# Patient Record
Sex: Female | Born: 1960
Health system: Southern US, Community
[De-identification: ages and names within clinical notes are randomized; demographics above are authoritative.]

## PROBLEM LIST (undated history)

## (undated) DIAGNOSIS — C4370 Malignant melanoma of unspecified lower limb, including hip: Secondary | ICD-10-CM

## (undated) DIAGNOSIS — E039 Hypothyroidism, unspecified: Secondary | ICD-10-CM

## (undated) DIAGNOSIS — L409 Psoriasis, unspecified: Secondary | ICD-10-CM

## (undated) DIAGNOSIS — K759 Inflammatory liver disease, unspecified: Secondary | ICD-10-CM

## (undated) DIAGNOSIS — J189 Pneumonia, unspecified organism: Secondary | ICD-10-CM

## (undated) DIAGNOSIS — T7840XA Allergy, unspecified, initial encounter: Secondary | ICD-10-CM

## (undated) DIAGNOSIS — C4490 Unspecified malignant neoplasm of skin, unspecified: Secondary | ICD-10-CM

## (undated) DIAGNOSIS — E559 Vitamin D deficiency, unspecified: Secondary | ICD-10-CM

## (undated) DIAGNOSIS — D649 Anemia, unspecified: Secondary | ICD-10-CM

## (undated) DIAGNOSIS — L719 Rosacea, unspecified: Secondary | ICD-10-CM

## (undated) DIAGNOSIS — I480 Paroxysmal atrial fibrillation: Secondary | ICD-10-CM

## (undated) DIAGNOSIS — J45901 Unspecified asthma with (acute) exacerbation: Secondary | ICD-10-CM

## (undated) DIAGNOSIS — R011 Cardiac murmur, unspecified: Secondary | ICD-10-CM

## (undated) DIAGNOSIS — E7800 Pure hypercholesterolemia, unspecified: Secondary | ICD-10-CM

## (undated) DIAGNOSIS — M1990 Unspecified osteoarthritis, unspecified site: Secondary | ICD-10-CM

## (undated) DIAGNOSIS — I10 Essential (primary) hypertension: Secondary | ICD-10-CM

## (undated) DIAGNOSIS — H409 Unspecified glaucoma: Secondary | ICD-10-CM

## (undated) DIAGNOSIS — I4892 Unspecified atrial flutter: Secondary | ICD-10-CM

## (undated) HISTORY — DX: Psoriasis, unspecified: L40.9

## (undated) HISTORY — PX: ENDOMETRIAL ABLATION: SHX621

## (undated) HISTORY — DX: Essential (primary) hypertension: I10

## (undated) HISTORY — DX: Vitamin D deficiency, unspecified: E55.9

## (undated) HISTORY — DX: Allergy, unspecified, initial encounter: T78.40XA

## (undated) HISTORY — PX: TUBAL LIGATION: SHX77

## (undated) HISTORY — DX: Rosacea, unspecified: L71.9

## (undated) HISTORY — DX: Hypothyroidism, unspecified: E03.9

## (undated) HISTORY — DX: Paroxysmal atrial fibrillation: I48.0

## (undated) HISTORY — DX: Unspecified glaucoma: H40.9

## (undated) HISTORY — DX: Unspecified malignant neoplasm of skin, unspecified: C44.90

## (undated) HISTORY — DX: Anemia, unspecified: D64.9

## (undated) HISTORY — PX: REFRACTIVE SURGERY: SHX103

---
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
## 2011-12-19 ENCOUNTER — Encounter: Payer: Self-pay | Admitting: Cardiovascular Disease

## 2011-12-20 ENCOUNTER — Ambulatory Visit (INDEPENDENT_AMBULATORY_CARE_PROVIDER_SITE_OTHER): Payer: 59 | Admitting: Cardiovascular Disease

## 2011-12-20 ENCOUNTER — Encounter: Payer: Self-pay | Admitting: Cardiovascular Disease

## 2011-12-20 VITALS — BP 130/80 | HR 72 | Ht 66.0 in | Wt 207.0 lb

## 2011-12-20 DIAGNOSIS — R079 Chest pain, unspecified: Secondary | ICD-10-CM

## 2011-12-20 DIAGNOSIS — Z79899 Other long term (current) drug therapy: Secondary | ICD-10-CM

## 2011-12-20 DIAGNOSIS — E782 Mixed hyperlipidemia: Secondary | ICD-10-CM | POA: Insufficient documentation

## 2011-12-20 DIAGNOSIS — I480 Paroxysmal atrial fibrillation: Secondary | ICD-10-CM

## 2011-12-20 DIAGNOSIS — I4891 Unspecified atrial fibrillation: Secondary | ICD-10-CM

## 2011-12-20 NOTE — Assessment & Plan Note (Signed)
Cholesterol is at goal.  Continue current dose of statin and diet Rx.  No myalgias or side effects.  F/U  LFT's in 6 months. No results found for this basename: LDLCALC  Labs with primary            

## 2011-12-20 NOTE — Patient Instructions (Signed)
Your physician has requested that you have an exercise tolerance test. For further information please visit [email or web address] Please also follow instruction sheet, as given. DX V58.69  427.31 You have been referred to EP  AFIB  AND ON MULTAQ Your physician recommends that you continue on your current medications as directed. Please refer to the Current Medication list given to you today.

## 2011-12-20 NOTE — Assessment & Plan Note (Signed)
Atypical normal ECG With PAF and Multaq will have her do ETT

## 2011-12-20 NOTE — Progress Notes (Signed)
Patient ID: Monica Morgan, female   DOB: 25-Mar-1961, 51 y.o.   MRN: [id number] 51 yo recently relocated from [facility] and needs cardiologist.  PAF over last 3-4 years.  No other structural heart disease.  Last stress test in 2009.  Initially Rx with rhythmol but had fatigue and weight gain. Never needed Anticoagulation or DCC.  Frequent intermitant episodes documented by monitor.  Especially when lying on left side.  Recent weight gain and more sedentary since moving to [place] and working at [employer].  Has occassional palpitations but much better on Multaq.  Discussed block box warning with her.  She prefers to stay on low dose Multaq at only 200mg  daily.  Has occasional SSCP when tired or with palpitations.  No associated dyspnea pleurisy or syncope.  Compliant with meds  CRF; include elevated cholesterol  [place] Score 0     ROS: Denies fever, malais, weight loss, blurry vision, decreased visual acuity, cough, sputum, SOB, hemoptysis, pleuritic pain, palpitaitons, heartburn, abdominal pain, melena, lower extremity edema, claudication, or rash.  All other systems reviewed and negative   General: Affect appropriate Healthy:  appears stated age HEENT: normal Neck supple with no adenopathy JVP normal no bruits no thyromegaly Lungs clear with no wheezing and good diaphragmatic motion Heart:  S1/S2 no murmur,rub, gallop or click PMI normal Abdomen: benighn, BS positve, no tenderness, no AAA no bruit.  No HSM or HJR Distal pulses intact with no bruits No edema Neuro non-focal Skin warm and dry No muscular weakness  Medications Current Outpatient Prescriptions  Medication Sig Dispense Refill  . aspirin 81 MG tablet Take 81 mg by mouth daily.      . betamethasone dipropionate (DIPROLENE) 0.05 % cream Apply 1 application topically as needed.      . dronedarone (MULTAQ) 400 MG tablet Take 200 mg by mouth daily.      Marland Kitchen HYDROcodone-acetaminophen (VICODIN) 5-500 MG per tablet Take 1 tablet by  mouth every 6 (six) hours as needed.      Marland Kitchen ibuprofen (ADVIL,MOTRIN) 200 MG tablet Take 800 mg by mouth every 6 (six) hours as needed.      . montelukast (SINGULAIR) 10 MG tablet Take 10 mg by mouth at bedtime.      . Multiple Vitamins-Minerals (OSTEO COMPLEX PO) Take by mouth.      . NON FORMULARY Take 1 application by mouth as needed. Metacream      . rosuvastatin (CRESTOR) 20 MG tablet Take 10 mg by mouth daily.        Allergies Lipitor; Rythmol; and Sulfa drugs cross reactors  Family History: No family history on file.  Social History: History   Social History  . Marital Status: Divorced    Spouse Name: N/A    Number of Children: N/A  . Years of Education: N/A   Occupational History  . Not on file.   Social History Main Topics  . Smoking status: Never Smoker   . Smokeless tobacco: Not on file  . Alcohol Use: Not on file  . Drug Use: Not on file  . Sexually Active: Not on file   Other Topics Concern  . Not on file   Social History Narrative  . No narrative on file    Electrocardiogram:  NSR rate 75 QT 442 normal  Assessment and Plan

## 2011-12-20 NOTE — Assessment & Plan Note (Signed)
Continue low dose Multaq  She indicates normal EF and recent LFT;s normal.  Refer to EPS to see if ongoing Multaq ok.  Alternative could be Flecainide ASA no need for coumadin

## 2012-02-07 ENCOUNTER — Ambulatory Visit (INDEPENDENT_AMBULATORY_CARE_PROVIDER_SITE_OTHER): Payer: 59 | Admitting: Cardiovascular Disease

## 2012-02-07 DIAGNOSIS — I4891 Unspecified atrial fibrillation: Secondary | ICD-10-CM

## 2012-02-07 DIAGNOSIS — Z79899 Other long term (current) drug therapy: Secondary | ICD-10-CM

## 2012-02-07 NOTE — Procedures (Signed)
Exercise Treadmill Test  Pre-Exercise Testing Evaluation Rhythm: normal sinus  Rate: 66   PR:  .15 QRS:  .09  QT:  .42 QTc: .44     Test  Exercise Tolerance Test Ordering MD: Charlton Haws, MD  Interpreting MD: Charlton Haws, MD  Unique Test No: 1  Treadmill:  1  Indication for ETT: A-FIB  Contraindication to ETT: No   Stress Modality: exercise - treadmill  Cardiac Imaging Performed: non   Protocol: standard Bruce - maximal  Max BP:  171/63  Max MPHR (bpm):  167 85% MPR (bpm):  142  MPHR obtained (bpm):  153 % MPHR obtained:  91  Reached 85% MPHR (min:sec): 5:54 Total Exercise Time (min-sec):  7:00  Workload in METS:  10.6 Borg Scale: 15  Reason ETT Terminated:  fatigue    ST Segment Analysis At Rest: normal ST segments - no evidence of significant ST depression With Exercise: no evidence of significant ST depression  Other Information Arrhythmia:  No Angina during ETT:  absent (0) Quality of ETT:  diagnostic  ETT Interpretation:  normal - no evidence of ischemia by ST analysis  Comments: Normal ETT with no proarrhythmia or ischemia  Recommendations: Continue low dose Multaq  Charlton Haws

## 2012-02-14 ENCOUNTER — Ambulatory Visit: Payer: 59 | Admitting: Internal Medicine

## 2012-03-20 ENCOUNTER — Ambulatory Visit (INDEPENDENT_AMBULATORY_CARE_PROVIDER_SITE_OTHER): Payer: 59 | Admitting: Internal Medicine

## 2012-03-20 ENCOUNTER — Encounter: Payer: Self-pay | Admitting: Internal Medicine

## 2012-03-20 VITALS — BP 124/74 | HR 72 | Ht 66.0 in | Wt 208.0 lb

## 2012-03-20 DIAGNOSIS — I480 Paroxysmal atrial fibrillation: Secondary | ICD-10-CM

## 2012-03-20 DIAGNOSIS — I4891 Unspecified atrial fibrillation: Secondary | ICD-10-CM

## 2012-03-20 DIAGNOSIS — E782 Mixed hyperlipidemia: Secondary | ICD-10-CM

## 2012-03-20 MED ORDER — DRONEDARONE HCL 400 MG PO TABS: 200.0000 mg | ORAL_TABLET | Freq: Every day | ORAL | Status: DC

## 2012-03-20 MED ORDER — ROSUVASTATIN CALCIUM 20 MG PO TABS: 10.0000 mg | ORAL_TABLET | Freq: Every day | ORAL | Status: DC

## 2012-03-20 NOTE — Assessment & Plan Note (Signed)
The patient is maintaining sinus rhythm very nicely on her low-dose medical therapy with multaq. She will continue her current medical therapy. Would not switch her to another antiarrhythmic drug at this time. She will continue low-dose aspirin.

## 2012-03-20 NOTE — Patient Instructions (Signed)
Your physician recommends that you schedule a follow-up appointment as needed with Dr Ladona Ridgel  Keep follow up with Dr Eden Emms

## 2012-03-20 NOTE — Progress Notes (Signed)
HPI Monica Morgan is referred today by Dr. Eden Emms for evaluation of atrial fibrillation. She is a very pleasant middle-age woman with a history of tachycardia palpitations dating back several years. She is taking multiple medications but has done well on Dronenderone. Her palpitations have essentially resolved on this medication. She is reduce her dose from 400 milligrams twice daily and 200 mg daily, and maintain good control of her atrial fibrillation. She denies chest pain or shortness of breath, and has no peripheral edema or syncope.  Allergies  Allergen Reactions  . Lipitor (Atorvastatin)   . Rythmol (Propafenone)   . Sulfa Drugs Cross Reactors      Current Outpatient Prescriptions  Medication Sig Dispense Refill  . aspirin 81 MG tablet Take 81 mg by mouth daily.      . betamethasone dipropionate (DIPROLENE) 0.05 % cream Apply 1 application topically as needed.      . dronedarone (MULTAQ) 400 MG tablet Take 0.5 tablets (200 mg total) by mouth daily.  45 tablet  3  . HYDROcodone-acetaminophen (VICODIN) 5-500 MG per tablet Take 1 tablet by mouth every 6 (six) hours as needed.      Marland Kitchen ibuprofen (ADVIL,MOTRIN) 200 MG tablet Take 800 mg by mouth every 6 (six) hours as needed.      . montelukast (SINGULAIR) 10 MG tablet Take 10 mg by mouth at bedtime as needed.       . Multiple Vitamins-Minerals (OSTEO COMPLEX PO) Take by mouth.      . NON FORMULARY Take 1 application by mouth as needed. Metacream      . rosuvastatin (CRESTOR) 20 MG tablet Take 0.5 tablets (10 mg total) by mouth daily.  45 tablet  3  . [DISCONTINUED] dronedarone (MULTAQ) 400 MG tablet Take 200 mg by mouth daily.      . [DISCONTINUED] rosuvastatin (CRESTOR) 20 MG tablet Take 10 mg by mouth daily.         Past Medical History  Diagnosis Date  . Hyperlipidemia   . Hypertension   . Arrhythmia     afib    ROS:   All systems reviewed and negative except as noted in the HPI.   No past surgical history on file.   No  family history on file.   History   Social History  . Marital Status: Divorced    Spouse Name: N/A    Number of Children: N/A  . Years of Education: N/A   Occupational History  . Not on file.   Social History Main Topics  . Smoking status: Never Smoker   . Smokeless tobacco: Not on file  . Alcohol Use: Not on file  . Drug Use: Not on file  . Sexually Active: Not on file   Other Topics Concern  . Not on file   Social History Narrative  . No narrative on file     BP 124/74  Pulse 72  Ht 5\' 6"  (1.676 m)  Wt 208 lb (94.348 kg)  BMI 33.57 kg/m2  Physical Exam:  Well appearing  middle-aged woman, NAD HEENT: Unremarkable Neck:  No JVD, no thyromegally Lungs:  Clear with no wheezes, rales, or rhonchi. HEART:  Regular rate rhythm, no murmurs, no rubs, no clicks Abd:  soft, positive bowel sounds, no organomegally, no rebound, no guarding Ext:  2 plus pulses, no edema, no cyanosis, no clubbing Skin:  No rashes no nodules Neuro:  CN II through XII intact, motor grossly intact  EKG  normal sinus rhythm with normal  axis and intervals.   Assess/Plan:

## 2012-03-20 NOTE — Assessment & Plan Note (Signed)
She will continue her statin therapy with Crestor. She is exercising regularly, and trying to lose weight.

## 2012-08-04 ENCOUNTER — Encounter: Payer: Self-pay | Admitting: *Deleted

## 2012-09-11 ENCOUNTER — Ambulatory Visit (INDEPENDENT_AMBULATORY_CARE_PROVIDER_SITE_OTHER): Payer: 59 | Admitting: Cardiovascular Disease

## 2012-09-11 ENCOUNTER — Encounter: Payer: Self-pay | Admitting: Cardiovascular Disease

## 2012-09-11 VITALS — BP 138/80 | HR 64 | Ht 66.0 in | Wt 215.0 lb

## 2012-09-11 DIAGNOSIS — I4891 Unspecified atrial fibrillation: Secondary | ICD-10-CM

## 2012-09-11 DIAGNOSIS — R079 Chest pain, unspecified: Secondary | ICD-10-CM

## 2012-09-11 DIAGNOSIS — I480 Paroxysmal atrial fibrillation: Secondary | ICD-10-CM

## 2012-09-11 DIAGNOSIS — E782 Mixed hyperlipidemia: Secondary | ICD-10-CM

## 2012-09-11 NOTE — Assessment & Plan Note (Signed)
Cholesterol is at goal.  Continue current dose of statin and diet Rx.  No myalgias or side effects.  F/U  LFT's in 6 months. No results found for this basename: LDLCALC             

## 2012-09-11 NOTE — Assessment & Plan Note (Signed)
Resolved normal ETT last year  observe

## 2012-09-11 NOTE — Progress Notes (Signed)
Patient ID: Monica Morgan, female   DOB: 04-16-1961, 52 y.o.   MRN: [id number] 52 yo recently relocated from [place] . PAF over last 3-4 years. No other structural heart disease. Last stress test in 2013 at our office was normal  Initially Rx with rhythmol but had fatigue and weight gain. Never needed Anticoagulation or DCC. Frequent intermitant episodes documented by monitor. Especially when lying on left side. Recent weight gain and more sedentary since moving to [place] and working at [employer]. Has occassional palpitations but much better on Multaq. Discussed block box warning with her. She prefers to stay on low dose Multaq at only 200mg  daily. Has occasional SSCP when tired or with palpitations. No associated dyspnea pleurisy or syncope. Compliant with meds CRF; include elevated cholesterol Italy Score 0  Seen by Dr Ladona Ridgel who agreed that maint of low dose Multaq ok   Labs at primary 8/13 had normal LFTs and LDL 61  She indicates just checked last week with primary and normal  ROS: Denies fever, malais, weight loss, blurry vision, decreased visual acuity, cough, sputum, SOB, hemoptysis, pleuritic pain, palpitaitons, heartburn, abdominal pain, melena, lower extremity edema, claudication, or rash.  All other systems reviewed and negative  General: Affect appropriate Overweight white female HEENT: normal Neck supple with no adenopathy JVP normal no bruits no thyromegaly Lungs clear with no wheezing and good diaphragmatic motion Heart:  S1/S2 no murmur, no rub, gallop or click PMI normal Abdomen: benighn, BS positve, no tenderness, no AAA no bruit.  No HSM or HJR Distal pulses intact with no bruits No edema Neuro non-focal Skin warm and dry No muscular weakness   Current Outpatient Prescriptions  Medication Sig Dispense Refill  . aspirin 81 MG tablet Take 81 mg by mouth daily.      . betamethasone dipropionate (DIPROLENE) 0.05 % cream Apply 1 application topically as needed.      .  dronedarone (MULTAQ) 400 MG tablet Take 0.5 tablets (200 mg total) by mouth daily.  45 tablet  3  . HYDROcodone-acetaminophen (VICODIN) 5-500 MG per tablet Take 1 tablet by mouth every 6 (six) hours as needed.      Marland Kitchen ibuprofen (ADVIL,MOTRIN) 200 MG tablet Take 800 mg by mouth every 6 (six) hours as needed.      . montelukast (SINGULAIR) 10 MG tablet Take 10 mg by mouth at bedtime as needed.       . Multiple Vitamins-Minerals (OSTEO COMPLEX PO) Take by mouth.      . NON FORMULARY Take 1 application by mouth as needed. Metacream      . rosuvastatin (CRESTOR) 20 MG tablet Take 0.5 tablets (10 mg total) by mouth daily.  45 tablet  3   No current facility-administered medications for this visit.    Allergies  Lipitor; Rythmol; and Sulfa drugs cross reactors  Electrocardiogram:  03/20/13 SR rate 72 normal QT 386  Assessment and Plan

## 2012-09-11 NOTE — Assessment & Plan Note (Signed)
Stable with no recurrence continue low dose Multaq and asa

## 2012-11-05 ENCOUNTER — Other Ambulatory Visit (HOSPITAL_COMMUNITY): Payer: Self-pay | Admitting: Internal Medicine

## 2012-11-05 DIAGNOSIS — Z1231 Encounter for screening mammogram for malignant neoplasm of breast: Secondary | ICD-10-CM

## 2012-11-13 ENCOUNTER — Ambulatory Visit (HOSPITAL_COMMUNITY): Payer: 59

## 2012-11-19 ENCOUNTER — Ambulatory Visit (HOSPITAL_COMMUNITY)
Admission: RE | Admit: 2012-11-19 | Discharge: 2012-11-19 | Disposition: A | Discharge status: Home / Self Care | Payer: 59 | Source: Ambulatory Visit | Attending: Internal Medicine | Admitting: Internal Medicine

## 2012-11-19 DIAGNOSIS — Z1231 Encounter for screening mammogram for malignant neoplasm of breast: Secondary | ICD-10-CM

## 2012-12-01 LAB — HM MAMMOGRAPHY

## 2012-12-11 ENCOUNTER — Encounter: Payer: Self-pay | Admitting: Gastroenterology

## 2012-12-16 ENCOUNTER — Other Ambulatory Visit (HOSPITAL_COMMUNITY): Payer: Self-pay | Admitting: Internal Medicine

## 2012-12-16 DIAGNOSIS — E2839 Other primary ovarian failure: Secondary | ICD-10-CM

## 2012-12-24 ENCOUNTER — Ambulatory Visit (HOSPITAL_COMMUNITY)
Admission: RE | Admit: 2012-12-24 | Discharge: 2012-12-24 | Disposition: A | Discharge status: Home / Self Care | Payer: 59 | Source: Ambulatory Visit | Attending: Internal Medicine | Admitting: Internal Medicine

## 2012-12-24 DIAGNOSIS — Z780 Asymptomatic menopausal state: Secondary | ICD-10-CM | POA: Insufficient documentation

## 2012-12-24 DIAGNOSIS — E2839 Other primary ovarian failure: Secondary | ICD-10-CM

## 2012-12-24 DIAGNOSIS — Z13820 Encounter for screening for osteoporosis: Secondary | ICD-10-CM | POA: Insufficient documentation

## 2012-12-24 LAB — HM DEXA SCAN

## 2013-02-13 ENCOUNTER — Encounter: Payer: 59 | Admitting: Gastroenterology

## 2013-02-18 ENCOUNTER — Ambulatory Visit (AMBULATORY_SURGERY_CENTER): Payer: Self-pay | Admitting: *Deleted

## 2013-02-18 VITALS — Ht 66.0 in | Wt 229.0 lb

## 2013-02-18 DIAGNOSIS — Z1211 Encounter for screening for malignant neoplasm of colon: Secondary | ICD-10-CM

## 2013-02-18 MED ORDER — MOVIPREP 100 G PO SOLR: 1.0000 | Freq: Once | ORAL | Status: DC

## 2013-02-18 NOTE — Progress Notes (Signed)
Sent flag to Dr.Perry asking him to review patient's history and medications list before she comes in for her screening colonoscopy.  Thanks!

## 2013-02-18 NOTE — Progress Notes (Signed)
Patient denies any allergies to eggs or soy. Patient denies any problems with anesthesia.  

## 2013-02-20 ENCOUNTER — Encounter: Payer: Self-pay | Admitting: Internal Medicine

## 2013-03-03 ENCOUNTER — Encounter: Payer: Self-pay | Admitting: Physician Assistant

## 2013-03-03 DIAGNOSIS — E785 Hyperlipidemia, unspecified: Secondary | ICD-10-CM

## 2013-03-03 DIAGNOSIS — I10 Essential (primary) hypertension: Secondary | ICD-10-CM

## 2013-03-03 DIAGNOSIS — E559 Vitamin D deficiency, unspecified: Secondary | ICD-10-CM

## 2013-03-04 ENCOUNTER — Encounter: Payer: Self-pay | Admitting: Physician Assistant

## 2013-03-04 ENCOUNTER — Ambulatory Visit: Payer: 59 | Admitting: Physician Assistant

## 2013-03-04 VITALS — BP 138/80 | HR 76 | Temp 98.1°F | Resp 16 | Ht 65.5 in | Wt 227.0 lb

## 2013-03-04 DIAGNOSIS — E782 Mixed hyperlipidemia: Secondary | ICD-10-CM

## 2013-03-04 DIAGNOSIS — E559 Vitamin D deficiency, unspecified: Secondary | ICD-10-CM

## 2013-03-04 DIAGNOSIS — I10 Essential (primary) hypertension: Secondary | ICD-10-CM

## 2013-03-04 LAB — HEPATIC FUNCTION PANEL
ALT: 10 U/L (ref 0–35)
AST: 16 U/L (ref 0–37)
Albumin: 4.2 g/dL (ref 3.5–5.2)
Alkaline Phosphatase: 67 U/L (ref 39–117)
Bilirubin, Direct: 0.1 mg/dL (ref 0.0–0.3)
Indirect Bilirubin: 0.5 mg/dL (ref 0.0–0.9)
Total Bilirubin: 0.6 mg/dL (ref 0.3–1.2)
Total Protein: 6.9 g/dL (ref 6.0–8.3)

## 2013-03-04 LAB — CBC WITH DIFFERENTIAL/PLATELET
Basophils Absolute: 0.1 10*3/uL (ref 0.0–0.1)
Basophils Relative: 0 % (ref 0–1)
Eosinophils Absolute: 0.2 10*3/uL (ref 0.0–0.7)
Eosinophils Relative: 1 % (ref 0–5)
HCT: 39.6 % (ref 36.0–46.0)
Hemoglobin: 13.6 g/dL (ref 12.0–15.0)
Lymphocytes Relative: 32 % (ref 12–46)
Lymphs Abs: 4.3 10*3/uL — ABNORMAL HIGH (ref 0.7–4.0)
MCH: 29.6 pg (ref 26.0–34.0)
MCHC: 34.3 g/dL (ref 30.0–36.0)
MCV: 86.3 fL (ref 78.0–100.0)
Monocytes Absolute: 1 10*3/uL (ref 0.1–1.0)
Monocytes Relative: 8 % (ref 3–12)
Neutro Abs: 8.1 10*3/uL — ABNORMAL HIGH (ref 1.7–7.7)
Neutrophils Relative %: 59 % (ref 43–77)
Platelets: 333 10*3/uL (ref 150–400)
RBC: 4.59 MIL/uL (ref 3.87–5.11)
RDW: 14.1 % (ref 11.5–15.5)
WBC: 13.7 10*3/uL — ABNORMAL HIGH (ref 4.0–10.5)

## 2013-03-04 LAB — LIPID PANEL
Cholesterol: 128 mg/dL (ref 0–200)
HDL: 49 mg/dL (ref >39)
LDL Cholesterol: 58 mg/dL (ref 0–99)
Total CHOL/HDL Ratio: 2.6 Ratio
Triglycerides: 104 mg/dL (ref <150)
VLDL: 21 mg/dL (ref 0–40)

## 2013-03-04 LAB — BASIC METABOLIC PANEL WITH GFR
BUN: 13 mg/dL (ref 6–23)
CO2: 24 mEq/L (ref 19–32)
Calcium: 9.3 mg/dL (ref 8.4–10.5)
Chloride: 103 mEq/L (ref 96–112)
Creat: 0.77 mg/dL (ref 0.50–1.10)
GFR, Est African American: >89 mL/min
GFR, Est Non African American: 88 mL/min
Glucose, Bld: 88 mg/dL (ref 70–99)
Potassium: 4.3 mEq/L (ref 3.5–5.3)
Sodium: 137 mEq/L (ref 135–145)

## 2013-03-04 MED ORDER — IBUPROFEN 800 MG PO TABS: 800.0000 mg | ORAL_TABLET | Freq: Four times a day (QID) | ORAL | Status: DC | PRN

## 2013-03-04 NOTE — Patient Instructions (Signed)
Compression stocking- Elastic therapy in [place].  Sleep Apnea  Sleep apnea is a sleep disorder characterized by abnormal pauses in breathing while you sleep. When your breathing pauses, the level of oxygen in your blood decreases. This causes you to move out of deep sleep and into light sleep. As a result, your quality of sleep is poor, and the system that carries your blood throughout your body (cardiovascular system) experiences stress. If sleep apnea remains untreated, the following conditions can develop:  High blood pressure (hypertension).  Coronary artery disease.  Inability to achieve or maintain an erection (impotence).  Impairment of your thought process (cognitive dysfunction). There are three types of sleep apnea: 1. Obstructive sleep apnea Pauses in breathing during sleep because of a blocked airway. 2. Central sleep apnea Pauses in breathing during sleep because the area of the brain that controls your breathing does not send the correct signals to the muscles that control breathing. 3. Mixed sleep apnea A combination of both obstructive and central sleep apnea. RISK FACTORS The following risk factors can increase your risk of developing sleep apnea:  Being overweight.  Smoking.  Having narrow passages in your nose and throat.  Being of older age.  Being female.  Alcohol use.  Sedative and tranquilizer use.  Ethnicity. Among individuals younger than 35 years, African Americans are at increased risk of sleep apnea. SYMPTOMS   Difficulty staying asleep.  Daytime sleepiness and fatigue.  Loss of energy.  Irritability.  Loud, heavy snoring.  Morning headaches.  Trouble concentrating.  Forgetfulness.  Decreased interest in sex. DIAGNOSIS  In order to diagnose sleep apnea, your caregiver will perform a physical examination. Your caregiver may suggest that you take a home sleep test. Your caregiver may also recommend that you spend the night in a sleep lab.  In the sleep lab, several monitors record information about your heart, lungs, and brain while you sleep. Your leg and arm movements and blood oxygen level are also recorded. TREATMENT The following actions may help to resolve mild sleep apnea:  Sleeping on your side.   Using a decongestant if you have nasal congestion.   Avoiding the use of depressants, including alcohol, sedatives, and narcotics.   Losing weight and modifying your diet if you are overweight. There also are devices and treatments to help open your airway:  Oral appliances. These are custom-made mouthpieces that shift your lower jaw forward and slightly open your bite. This opens your airway.  Devices that create positive airway pressure. This positive pressure "splints" your airway open to help you breathe better during sleep. The following devices create positive airway pressure:  Continuous positive airway pressure (CPAP) device. The CPAP device creates a continuous level of air pressure with an air pump. The air is delivered to your airway through a mask while you sleep. This continuous pressure keeps your airway open.  Nasal expiratory positive airway pressure (EPAP) device. The EPAP device creates positive air pressure as you exhale. The device consists of single-use valves, which are inserted into each nostril and held in place by adhesive. The valves create very little resistance when you inhale but create much more resistance when you exhale. That increased resistance creates the positive airway pressure. This positive pressure while you exhale keeps your airway open, making it easier to breath when you inhale again.  Bilevel positive airway pressure (BPAP) device. The BPAP device is used mainly in patients with central sleep apnea. This device is similar to the CPAP device because it  also uses an air pump to deliver continuous air pressure through a mask. However, with the BPAP machine, the pressure is set at two  different levels. The pressure when you exhale is lower than the pressure when you inhale.  Surgery. Typically, surgery is only done if you cannot comply with less invasive treatments or if the less invasive treatments do not improve your condition. Surgery involves removing excess tissue in your airway to create a wider passage way. Document Released: 03/23/2002 Document Revised: 07/28/2012 Document Reviewed: 08/09/2011 [facility] Patient Information 2014 [place], [place].

## 2013-03-04 NOTE — Progress Notes (Signed)
HPI Patient presents for 3 month follow up with hypertension, hyperlipidemia, prediabetes and vitamin D. Patient's blood pressure has been controlled at home. Patient denies chest pain, shortness of breath, dizziness.  Patient's cholesterol is diet controlled. In addition they are on crestor and denies myalgias.  Patient is now on compound pharmacy for estrogen and is doing well without hot flashes.  Patient is on Vitamin D supplement.  Current Medications:  Current Outpatient Prescriptions on File Prior to Visit  Medication Sig Dispense Refill  . aspirin 81 MG tablet Take 81 mg by mouth daily.      . betamethasone dipropionate (DIPROLENE) 0.05 % cream Apply 1 application topically as needed.      . cholecalciferol (VITAMIN D) 1000 UNITS tablet Take 4,000 Units by mouth daily.      Marland Kitchen dronedarone (MULTAQ) 400 MG tablet Take 0.5 tablets (200 mg total) by mouth daily.  45 tablet  3  . HYDROcodone-acetaminophen (VICODIN) 5-500 MG per tablet Take 1 tablet by mouth every 6 (six) hours as needed.      Marland Kitchen ibuprofen (ADVIL,MOTRIN) 200 MG tablet Take 800 mg by mouth every 6 (six) hours as needed.      . montelukast (SINGULAIR) 10 MG tablet Take 10 mg by mouth at bedtime as needed.       Marland Kitchen MOVIPREP 100 G SOLR Take 1 kit (200 g total) by mouth once.  1 kit  0  . Multiple Vitamins-Minerals (OSTEO COMPLEX PO) Take by mouth.      . NON FORMULARY Take 1 application by mouth as needed. Metacream      . rosuvastatin (CRESTOR) 20 MG tablet Take 0.5 tablets (10 mg total) by mouth daily.  45 tablet  3   No current facility-administered medications on file prior to visit.   Medical History:  Past Medical History  Diagnosis Date  . Hyperlipidemia   . Hypertension   . Arrhythmia     afib  . Melanoma     leg  . Rosacea   . Psoriasis   . Vitamin D deficiency    Allergies:  Allergies  Allergen Reactions  . Lipitor [Atorvastatin] Other (See Comments)    "neck pain"  . Rythmol [Propafenone] Other (See  Comments)    "weight gain"  . Sulfa Drugs Cross Reactors Other (See Comments)    "soreness all over"    ROS Constitutional: Denies fever, chills, weight loss/gain, headaches, insomnia, fatigue, night sweats, and change in appetite. Eyes: Denies redness, blurred vision, diplopia, discharge, itchy, watery eyes.  ENT: + post nasal drip and congestion for one week but is doing better, + snoring worse with congestion denies sore throat, earache, dental pain, Tinnitus, Vertigo, Sinus pain Cardio: Denies chest pain, palpitations, irregular heartbeat,  dyspnea, diaphoresis, orthopnea, PND, claudication, edema Respiratory: denies cough, dyspnea,pleurisy, hoarseness, wheezing.  Gastrointestinal: Denies dysphagia, heartburn,  water brash, pain, cramps, nausea, vomiting, bloating, diarrhea, constipation, hematemesis, melena, hematochezia,  hemorrhoids Genitourinary: Denies dysuria, frequency, urgency, nocturia, hesitancy, discharge, hematuria, flank pain Musculoskeletal: Denies arthralgia, myalgia, stiffness, Jt. Swelling, pain, limp, and strain/sprain. Skin: Denies pruritis, rash, hives, warts, acne, eczema, changing in skin lesion Neuro: Weakness, tremor, incoordination, spasms, paresthesia, pain Psychiatric: Denies confusion, memory loss, sensory loss Endocrine: Denies change in weight, skin, hair change, nocturia, and paresthesia, Diabetic Polys, visual blurring, hyper /hypo glycemic episodes.  Heme/Lymph: Excessive bleeding, bruising, enlarged lymph nodes  Family history- Review and unchanged Social history- Review and unchanged Physical Exam: Filed Vitals:   03/04/13 1643  BP: 138/80  Pulse: 76  Temp: 98.1 F (36.7 C)  Resp: 16   Filed Weights   03/04/13 1643  Weight: 227 lb (102.967 kg)   General Appearance: Well nourished, in no apparent distress. Eyes: PERRLA, EOMs, conjunctiva no swelling or erythema, normal fundi and vessels. Sinuses: No Frontal/maxillary tenderness ENT/Mouth:  Ext aud canals clear, with TMs without erythema, bulging.No erythema, swelling, or exudate on post pharynx.  Tonsils not swollen or erythematous. Hearing normal.  Neck: Supple, thyroid normal.  Respiratory: Respiratory effort normal, BS equal bilaterally without rales, rhonchi, wheezing or stridor.  Cardio: Heart sounds normal, regular rate and rhythm without murmurs, rubs or gallops. Peripheral pulses brisk and equal bilaterally, without edema.  Abdomen: Flat, soft, with bowel sounds. Non tender, no guarding, rebound, hernias, masses, or organomegaly.  Lymphatics: Non tender without lymphadenopathy.  Musculoskeletal: Full ROM all peripheral extremities, joint stability, 5/5 strength, and normal gait. Skin: Warm, dry without rashes, lesions, ecchymosis.  Neuro: Cranial nerves intact, reflexes equal bilaterally. Normal muscle tone, no cerebellar symptoms. Sensation intact.  Psych: Awake and oriented X 3, normal affect, Insight and Judgment appropriate.   Assessment and Plan:  Hypertension: Continue medication, monitor blood pressure at home. Continue DASH diet. Cholesterol: Continue diet and exercise. Check cholesterol.  Vitamin D Def- check level and continue medications.  Edema- get compression stockings Patient states that she will reschedule for colonoscopy secondary to the holidays Patient had a normal Pap 05/2011 and has never had an abnormal pap- due 2016  Quentin Mulling 4:52 PM

## 2013-03-05 LAB — VITAMIN D 25 HYDROXY (VIT D DEFICIENCY, FRACTURES): Vit D, 25-Hydroxy: 73 ng/mL (ref 30–89)

## 2013-03-05 LAB — TSH: TSH: 3.419 u[IU]/mL (ref 0.350–4.500)

## 2013-03-05 MED ORDER — LEVOTHYROXINE SODIUM 50 MCG PO TABS: 50.0000 ug | ORAL_TABLET | Freq: Every day | ORAL | Status: DC

## 2013-03-05 MED ORDER — AZITHROMYCIN 250 MG PO TABS: ORAL_TABLET | ORAL | Status: AC

## 2013-03-05 NOTE — Addendum Note (Signed)
Addended by: Ralph Leyden A on: 03/05/2013 03:54 PM   Modules accepted: Orders

## 2013-03-06 ENCOUNTER — Encounter: Payer: 59 | Admitting: Internal Medicine

## 2013-04-06 ENCOUNTER — Other Ambulatory Visit: Payer: Self-pay | Admitting: Physician Assistant

## 2013-04-06 DIAGNOSIS — E039 Hypothyroidism, unspecified: Secondary | ICD-10-CM

## 2013-04-07 ENCOUNTER — Other Ambulatory Visit: Payer: 59

## 2013-04-07 DIAGNOSIS — E039 Hypothyroidism, unspecified: Secondary | ICD-10-CM

## 2013-04-07 LAB — CBC WITH DIFFERENTIAL/PLATELET
Basophils Absolute: 0 10*3/uL (ref 0.0–0.1)
Basophils Relative: 0 % (ref 0–1)
Eosinophils Absolute: 0.1 10*3/uL (ref 0.0–0.7)
Eosinophils Relative: 1 % (ref 0–5)
HCT: 41.8 % (ref 36.0–46.0)
Hemoglobin: 14.4 g/dL (ref 12.0–15.0)
Lymphocytes Relative: 23 % (ref 12–46)
Lymphs Abs: 2.3 10*3/uL (ref 0.7–4.0)
MCH: 29.4 pg (ref 26.0–34.0)
MCHC: 34.4 g/dL (ref 30.0–36.0)
MCV: 85.3 fL (ref 78.0–100.0)
Monocytes Absolute: 0.7 10*3/uL (ref 0.1–1.0)
Monocytes Relative: 7 % (ref 3–12)
Neutro Abs: 6.9 10*3/uL (ref 1.7–7.7)
Neutrophils Relative %: 69 % (ref 43–77)
Platelets: 296 10*3/uL (ref 150–400)
RBC: 4.9 MIL/uL (ref 3.87–5.11)
RDW: 14.2 % (ref 11.5–15.5)
WBC: 10.1 10*3/uL (ref 4.0–10.5)

## 2013-04-07 LAB — TSH: TSH: 1.202 u[IU]/mL (ref 0.350–4.500)

## 2013-04-14 ENCOUNTER — Other Ambulatory Visit: Payer: Self-pay | Admitting: Cardiovascular Disease

## 2013-05-26 ENCOUNTER — Other Ambulatory Visit: Payer: Self-pay | Admitting: Physician Assistant

## 2013-06-04 ENCOUNTER — Ambulatory Visit (INDEPENDENT_AMBULATORY_CARE_PROVIDER_SITE_OTHER): Payer: 59 | Admitting: Internal Medicine

## 2013-06-04 ENCOUNTER — Encounter: Payer: Self-pay | Admitting: Internal Medicine

## 2013-06-04 VITALS — BP 118/72 | HR 56 | Temp 97.7°F | Resp 16 | Wt 178.6 lb

## 2013-06-04 DIAGNOSIS — E785 Hyperlipidemia, unspecified: Secondary | ICD-10-CM

## 2013-06-04 DIAGNOSIS — E559 Vitamin D deficiency, unspecified: Secondary | ICD-10-CM

## 2013-06-04 DIAGNOSIS — R7309 Other abnormal glucose: Secondary | ICD-10-CM

## 2013-06-04 DIAGNOSIS — E782 Mixed hyperlipidemia: Secondary | ICD-10-CM

## 2013-06-04 DIAGNOSIS — E039 Hypothyroidism, unspecified: Secondary | ICD-10-CM | POA: Insufficient documentation

## 2013-06-04 DIAGNOSIS — Z79899 Other long term (current) drug therapy: Secondary | ICD-10-CM

## 2013-06-04 DIAGNOSIS — I10 Essential (primary) hypertension: Secondary | ICD-10-CM

## 2013-06-04 LAB — CBC WITH DIFFERENTIAL/PLATELET
Basophils Absolute: 0.1 10*3/uL (ref 0.0–0.1)
Basophils Relative: 1 % (ref 0–1)
Eosinophils Absolute: 0.2 10*3/uL (ref 0.0–0.7)
Eosinophils Relative: 2 % (ref 0–5)
HCT: 40.2 % (ref 36.0–46.0)
Hemoglobin: 13.5 g/dL (ref 12.0–15.0)
Lymphocytes Relative: 41 % (ref 12–46)
Lymphs Abs: 3.4 10*3/uL (ref 0.7–4.0)
MCH: 28.6 pg (ref 26.0–34.0)
MCHC: 33.6 g/dL (ref 30.0–36.0)
MCV: 85.2 fL (ref 78.0–100.0)
Monocytes Absolute: 0.5 10*3/uL (ref 0.1–1.0)
Monocytes Relative: 6 % (ref 3–12)
Neutro Abs: 4.2 10*3/uL (ref 1.7–7.7)
Neutrophils Relative %: 50 % (ref 43–77)
Platelets: 308 10*3/uL (ref 150–400)
RBC: 4.72 MIL/uL (ref 3.87–5.11)
RDW: 14.5 % (ref 11.5–15.5)
WBC: 8.3 10*3/uL (ref 4.0–10.5)

## 2013-06-04 NOTE — Patient Instructions (Signed)

## 2013-06-04 NOTE — Progress Notes (Signed)
Patient ID: Monica Morgan, female   DOB: Oct 25, 1960, 53 y.o.   MRN: [id number]    This very nice 53 y.o. female presents for 6 month follow up with Hx/o elevated BP, pAfib,  Hyperlipidemia, Morbid Obesity and Vitamin D Deficiency.     BP has been controlled at home. Today's BP: 118/72 mmHg . Patient denies any cardiac type chest pain,  dyspnea/orthopnea/PND, dizziness, claudication, or dependent edema.She has been followed by Dr Johnsie Cancel for pAfib and is on Multaq which she has been trying to come off of and reports occasional palpitations and restart the Mulpaq.   Also, the patient has Hx/o Morbid Obesity with max weight of 220 # (BMI 37.5) and has lost 43 # over the last 6 months to her current weight of 178.6# (BMI 29.26) on some diet program thru a doctor in [place].Because of her Morbid Obesity she had been screened for PreDiabetes/insulin resistance with last A1c of 5.5% in Aug 2014. Patient denies any symptoms of reactive hypoglycemia, diabetic polys, paresthesias or visual blurring.  Hyperlipidemia is controlled with diet and she is off of her Crestor since her weight loss. Last Cholesterol (on treatment)  was 146, Triglycerides were 171, HDL 56 and LDL 50 in Aug 2014 - all at goal. Patient denies myalgias or other med SE's.    Further, Patient has history of Vitamin D Deficiency with last vitamin D of 64 in Aug 2014. Patient supplements vitamin D without any suspected side-effects.    Medication List       aspirin 81 MG tablet  Take 81 mg by mouth daily.     betamethasone dipropionate 0.05 % cream  Commonly known as:  DIPROLENE  Apply 1 application topically as needed.     cholecalciferol 1000 UNITS tablet  Commonly known as:  VITAMIN D  Take 4,000 Units by mouth daily.     ibuprofen 800 MG tablet  Commonly known as:  ADVIL,MOTRIN  Take 1 tablet (800 mg total) by mouth every 6 (six) hours as needed.     levothyroxine 50 MCG tablet  Commonly known as:  SYNTHROID  Take 1  tablet (50 mcg total) by mouth daily.     montelukast 10 MG tablet  Commonly known as:  SINGULAIR  Take 10 mg by mouth at bedtime as needed.     MULTAQ 400 MG tablet  Generic drug:  dronedarone  TAKE 0.5 TABLETS (200 MG TOTAL) BY MOUTH DAILY.     NONFORMULARY OR COMPOUNDED ITEM  3 (three) times a week. Estriol/testosterone 0.25-0.25 suppository compound     NONFORMULARY OR COMPOUNDED ITEM  Place onto the skin daily. Biest 8/2 HRT compound     OVER THE COUNTER MEDICATION  Multivitamin and diet supplement     PROGESTERONE (VAGINAL) 4 % Gel  Place onto the skin. Days 1-25 of each month     rosuvastatin 20 MG tablet  Commonly known as:  CRESTOR  Take 0.5 tablets (10 mg total) by mouth daily.         Allergies  Allergen Reactions  . Lipitor [Atorvastatin] Other (See Comments)    "neck pain"  . Rythmol [Propafenone] Other (See Comments)    "weight gain"  . Sulfa Drugs Cross Reactors Other (See Comments)    "soreness all over"    PMHx:   Past Medical History  Diagnosis Date  . Hyperlipidemia   . Hypertension   . Arrhythmia     afib  . Melanoma     leg  . Rosacea   .  Psoriasis   . Vitamin D deficiency     FHx:    Reviewed / unchanged  SHx:    Reviewed / unchanged  Systems Review: Constitutional: Denies fever, chills, wt changes, headaches, insomnia, fatigue, night sweats, change in appetite. Eyes: Denies redness, blurred vision, diplopia, discharge, itchy, watery eyes.  ENT: Denies discharge, congestion, post nasal drip, epistaxis, sore throat, earache, hearing loss, dental pain, tinnitus, vertigo, sinus pain, snoring.  CV: Denies chest pain, irregular heartbeat, syncope, dyspnea, diaphoresis, orthopnea, PND, claudication, edema. Has occas  Palpitations when off Multaq. Respiratory: denies cough, dyspnea, DOE, pleurisy, hoarseness, laryngitis, wheezing.  Gastrointestinal: Denies dysphagia, odynophagia, heartburn, reflux, water brash, abdominal pain or cramps,  nausea, vomiting, bloating, diarrhea, constipation, hematemesis, melena, hematochezia,  or hemorrhoids. Genitourinary: Denies dysuria, frequency, urgency, nocturia, hesitancy, discharge, hematuria, flank pain. Musculoskeletal: Denies arthralgias, myalgias, stiffness, jt. swelling, pain, limp, strain/sprain.  Skin: Denies pruritus, rash, hives, warts, acne, eczema, change in skin lesion(s). Neuro: No weakness, tremor, incoordination, spasms, paresthesia, or pain. Psychiatric: Denies confusion, memory loss, or sensory loss. Endo: Denies change in weight, skin, hair change.  Heme/Lymph: No excessive bleeding, bruising, orenlarged lymph nodes.  BP: 118/72  Pulse: 56  Temp: 97.7 F (36.5 C)  Resp: 16    Estimated body mass index is 29.26 kg/(m^2) as calculated from the following:   Height as of 03/04/13: 5' 5.5" (1.664 m).   Weight as of this encounter: 178 lb 9.6 oz (81.012 kg).  On Exam: Appears well nourished - in no distress. Eyes: PERRLA, EOMs, conjunctiva no swelling or erythema. Sinuses: No frontal/maxillary tenderness ENT/Mouth: EAC's clear, TM's nl w/o erythema, bulging. Nares clear w/o erythema, swelling, exudates. Oropharynx clear without erythema or exudates. Oral hygiene is good. Tongue normal, non obstructing. Hearing intact.  Neck: Supple. Thyroid nl. Car 2+/2+ without bruits, nodes or JVD. Chest: Respirations nl with BS clear & equal w/o rales, rhonchi, wheezing or stridor.  Cor: Heart sounds normal w/ regular rate and rhythm without sig. murmurs, gallops, clicks, or rubs. Peripheral pulses normal and equal  without edema.  Abdomen: Soft & bowel sounds normal. Non-tender w/o guarding, rebound, hernias, masses, or organomegaly.  Lymphatics: Unremarkable.  Musculoskeletal: Full ROM all peripheral extremities, joint stability, 5/5 strength, and normal gait.  Skin: Warm, dry without exposed rashes, lesions, ecchymosis apparent.  Neuro: Cranial nerves intact, reflexes equal  bilaterally. Sensory-motor testing grossly intact. Tendon reflexes grossly intact.  Pysch: Alert & oriented x 3. Insight and judgement nl & appropriate. No ideations.  Assessment and Plan:  1. Hx/o Elevated BP - Continue monitor blood pressure at home. Continue diet/meds same.  2. Hyperlipidemia - Continue diet/meds, exercise,& lifestyle modifications. Continue monitor periodic cholesterol/liver & renal functions   3. Pre-diabetes screening - Continue diet, exercise, lifestyle modifications. Monitor appropriate labs.  4. Vitamin D Deficiency - Continue supplementation.  Recommended regular exercise, BP monitoring, weight control, and discussed med and SE's. Recommended labs to assess and monitor clinical status. Further disposition pending results of labs.

## 2013-06-05 LAB — LIPID PANEL
Cholesterol: 156 mg/dL (ref 0–200)
HDL: 40 mg/dL (ref >39)
LDL Cholesterol: 95 mg/dL (ref 0–99)
Total CHOL/HDL Ratio: 3.9 Ratio
Triglycerides: 104 mg/dL (ref <150)
VLDL: 21 mg/dL (ref 0–40)

## 2013-06-05 LAB — INSULIN, FASTING: Insulin fasting, serum: 6 u[IU]/mL (ref 3–28)

## 2013-06-05 LAB — BASIC METABOLIC PANEL WITH GFR
BUN: 12 mg/dL (ref 6–23)
CO2: 26 mEq/L (ref 19–32)
Calcium: 9.8 mg/dL (ref 8.4–10.5)
Chloride: 101 mEq/L (ref 96–112)
Creat: 0.75 mg/dL (ref 0.50–1.10)
GFR, Est African American: >89 mL/min
GFR, Est Non African American: >89 mL/min
Glucose, Bld: 76 mg/dL (ref 70–99)
Potassium: 4.3 mEq/L (ref 3.5–5.3)
Sodium: 141 mEq/L (ref 135–145)

## 2013-06-05 LAB — HEMOGLOBIN A1C
Hgb A1c MFr Bld: 5.5 % (ref <5.7)
Mean Plasma Glucose: 111 mg/dL (ref <117)

## 2013-06-05 LAB — VITAMIN D 25 HYDROXY (VIT D DEFICIENCY, FRACTURES): Vit D, 25-Hydroxy: 98 ng/mL — ABNORMAL HIGH (ref 30–89)

## 2013-06-05 LAB — HEPATIC FUNCTION PANEL
ALT: 13 U/L (ref 0–35)
AST: 16 U/L (ref 0–37)
Albumin: 4.6 g/dL (ref 3.5–5.2)
Alkaline Phosphatase: 62 U/L (ref 39–117)
Bilirubin, Direct: 0.1 mg/dL (ref 0.0–0.3)
Indirect Bilirubin: 0.4 mg/dL (ref 0.2–1.2)
Total Bilirubin: 0.5 mg/dL (ref 0.2–1.2)
Total Protein: 6.9 g/dL (ref 6.0–8.3)

## 2013-06-05 LAB — TSH: TSH: 2.822 u[IU]/mL (ref 0.350–4.500)

## 2013-06-05 LAB — MAGNESIUM: Magnesium: 2.2 mg/dL (ref 1.5–2.5)

## 2013-09-17 ENCOUNTER — Encounter: Payer: Self-pay | Admitting: Cardiovascular Disease

## 2013-09-17 ENCOUNTER — Ambulatory Visit (INDEPENDENT_AMBULATORY_CARE_PROVIDER_SITE_OTHER): Payer: 59 | Admitting: Cardiovascular Disease

## 2013-09-17 VITALS — BP 132/78 | HR 60 | Ht 66.0 in | Wt 180.8 lb

## 2013-09-17 DIAGNOSIS — E782 Mixed hyperlipidemia: Secondary | ICD-10-CM

## 2013-09-17 DIAGNOSIS — I480 Paroxysmal atrial fibrillation: Secondary | ICD-10-CM

## 2013-09-17 DIAGNOSIS — I10 Essential (primary) hypertension: Secondary | ICD-10-CM

## 2013-09-17 DIAGNOSIS — I4891 Unspecified atrial fibrillation: Secondary | ICD-10-CM

## 2013-09-17 NOTE — Progress Notes (Signed)
Patient ID: Monica Morgan, female   DOB: 1960/07/19, 53 y.o.   MRN: [id number] 53 yo recently relocated from [place] . PAF over last 3-4 years. No other structural heart disease. Last stress test in 2013 at our office was normal Initially Rx with rhythmol but had fatigue and weight gain. Never needed Anticoagulation or DCC. Frequent intermitant episodes documented by monitor. Especially when lying on left side. Recent weight gain and more sedentary since moving to [place] and working at [employer]. Has occassional palpitations but much better on Multaq. Discussed block box warning with her. She prefers to stay on low dose Multaq at only 200mg  daily. Has occasional SSCP when tired or with palpitations. No associated dyspnea pleurisy or syncope. Compliant with meds CRF; include elevated cholesterol [place] Score 0 Seen by Dr Lovena Le who agreed that maint of low dose Multaq ok   Since I last saw her she has lost 30 lbs and looks great  Only a couple of episodes of PAF and she is not needing multaq daily   ROS: Denies fever, malais, weight loss, blurry vision, decreased visual acuity, cough, sputum, SOB, hemoptysis, pleuritic pain, palpitaitons, heartburn, abdominal pain, melena, lower extremity edema, claudication, or rash.  All other systems reviewed and negative  General: Affect appropriate Healthy:  appears stated age 3: normal Neck supple with no adenopathy JVP normal no bruits no thyromegaly Lungs clear with no wheezing and good diaphragmatic motion Heart:  S1/S2 no murmur, no rub, gallop or click PMI normal Abdomen: benighn, BS positve, no tenderness, no AAA no bruit.  No HSM or HJR Distal pulses intact with no bruits No edema Neuro non-focal Skin warm and dry No muscular weakness   Current Outpatient Prescriptions  Medication Sig Dispense Refill  . aspirin 81 MG tablet Take 81 mg by mouth daily.      . betamethasone dipropionate (DIPROLENE) 0.05 % cream Apply 1 application  topically as needed.      . cholecalciferol (VITAMIN D) 1000 UNITS tablet Take 4,000 Units by mouth daily.      Marland Kitchen ibuprofen (ADVIL,MOTRIN) 800 MG tablet Take 1 tablet (800 mg total) by mouth every 6 (six) hours as needed.  60 tablet  2  . levothyroxine (SYNTHROID) 50 MCG tablet Take 1 tablet (50 mcg total) by mouth daily.  30 tablet  3  . montelukast (SINGULAIR) 10 MG tablet Take 10 mg by mouth at bedtime as needed.       . MULTAQ 400 MG tablet TAKE 0.5 TABLETS (200 MG TOTAL) BY MOUTH DAILY.  45 tablet  3  . NONFORMULARY OR COMPOUNDED ITEM 3 (three) times a week. Estriol/testosterone 0.25-0.25 suppository compound      . NONFORMULARY OR COMPOUNDED ITEM Place onto the skin daily. Biest 8/2 HRT compound      . OVER THE COUNTER MEDICATION Multivitamin and diet supplement      . PROGESTERONE, VAGINAL, 4 % GEL Place onto the skin. Days 1-25 of each month       No current facility-administered medications for this visit.    Allergies  Lipitor; Rythmol; and Sulfa drugs cross reactors  Electrocardiogram:   NSR normal ECG   Assessment and Plan

## 2013-09-17 NOTE — Assessment & Plan Note (Signed)
Cholesterol is at goal.  Continue current dose of statin and diet Rx.  No myalgias or side effects.  F/U  LFT's in 6 months. Lab Results  Component Value Date   [place] 95 06/04/2013

## 2013-09-17 NOTE — Assessment & Plan Note (Signed)
Improved PRN Multaq  ECG normal

## 2013-09-17 NOTE — Assessment & Plan Note (Signed)
Well controlled.  Continue current medications and low sodium Dash type diet.    

## 2013-09-17 NOTE — Patient Instructions (Signed)
Your physician wants you to follow-up in: YEAR WITH DR NISHAN  You will receive a reminder letter in the mail two months in advance. If you don't receive a letter, please call our office to schedule the follow-up appointment.  Your physician recommends that you continue on your current medications as directed. Please refer to the Current Medication list given to you today. 

## 2013-09-22 ENCOUNTER — Other Ambulatory Visit: Payer: Self-pay | Admitting: Physician Assistant

## 2013-11-10 ENCOUNTER — Other Ambulatory Visit: Payer: Self-pay | Admitting: Internal Medicine

## 2013-11-10 DIAGNOSIS — Z1231 Encounter for screening mammogram for malignant neoplasm of breast: Secondary | ICD-10-CM

## 2013-11-25 ENCOUNTER — Ambulatory Visit (HOSPITAL_COMMUNITY)
Admission: RE | Admit: 2013-11-25 | Discharge: 2013-11-25 | Disposition: A | Discharge status: Home / Self Care | Payer: 59 | Source: Ambulatory Visit | Attending: Internal Medicine | Admitting: Internal Medicine

## 2013-11-25 DIAGNOSIS — Z1231 Encounter for screening mammogram for malignant neoplasm of breast: Secondary | ICD-10-CM | POA: Diagnosis present

## 2013-12-06 NOTE — Patient Instructions (Signed)

## 2013-12-06 NOTE — Progress Notes (Signed)
Patient ID: Monica Morgan, female   DOB: 01/07/1961, 53 y.o.   MRN: [id number]   Annual Screening Comprehensive Examination  This very nice 53 y.o.DWF presents for complete physical.  Patient has several year Hx/o pAfib - and she was initially intolerant to Rythmol and  has been stable & maintained on Low dose Multaq until she stopped it about 6 months ago. She  is followed by Dr Johnsie Cancel At [facility]. Patient did have a Negative stress test in 2013 Patient has been followed for labile HTN, Hyperlipidemia, and Vitamin D Deficiency. She has lost about 45-50# over the last year   Patient's BP has been controlled and today's BP is 128/80 mmHg. and patient denies any cardiac symptoms as chest pain, palpitations, shortness of breath, dizziness or ankle swelling.   Patient's hyperlipidemia is controlled with diet and she stopped her Crestor with her weight loss  over the last year. Last lipids off meds were Cholesterol 156; HDL  40; LDL  95; Triglycerides 104 on  06/04/2013.    Patient is screened for  prediabetes predating and patient denies reactive hypoglycemic symptoms, visual blurring, diabetic polys, or paresthesias. Last A1c was  5.5% on  06/04/2013.   Finally, patient has history of Vitamin D Deficiency and last Vitamin D was  98 on 06/04/2013.   Medication Sig  . aspirin 81 MG tablet Take 81 mg by mouth daily.  Marland Kitchen DIPROLENE 0.05 % cream Apply 1 application topically as needed.  Marland Kitchen ibuprofen 800 MG tablet Take 1 tablet (800 mg total) by mouth every 6 (six) hours as needed.  Marland Kitchen levothyroxine  50 MCG tablet TAKE 1 TABLET (50 MCG TOTAL) BY MOUTH DAILY.  . montelukast  10 MG tablet Take 10 mg by mouth at bedtime as needed.   . MULTAQ 400 MG tablet - OFF TAKE 0.5 TABLETS (200 MG TOTAL) BY MOUTH DAILY.-- OFF  .  COMPOUNDED ITEM 3 (three) times a week. Estriol/testosterone 0.25-0.25 suppository compound  .  COMPOUNDED ITEM Place onto the skin daily. Biest 8/2 HRT compound  . OTC Multivitamin and diet  supplement  . PROGESTERONE, VAG, 4 % GEL Place onto the skin. Days 1-25 of each month   Allergies  Allergen Reactions  . Lipitor [Atorvastatin] Other (See Comments)    "neck pain"  . Rythmol [Propafenone] Other (See Comments)    "weight gain"  . Sulfa Drugs Cross Reactors Other (See Comments)    "soreness all over"   Past Medical History  Diagnosis Date  . Hyperlipidemia   . Hypertension   . Arrhythmia     afib  . Melanoma     leg  . Rosacea   . Psoriasis   . Vitamin D deficiency    Past Surgical History  Procedure Laterality Date  . Skin cancer excision      leg  . Cesarean section with bilateral tubal ligation    . Cesarean section    . Cervical ablation     Family History  Problem Relation Age of Onset  . Heart disease Mother   . Hypertension Mother   . Heart disease Father   . Diabetes Father   . Hypertension Father   . Colon cancer Neg Hx    History  Substance Use Topics  . Smoking status: Never Smoker   . Smokeless tobacco: Never Used  . Alcohol Use: Yes     Comment: socially,weekends at beach    ROS Constitutional: Denies fever, chills, weight loss/gain, headaches, insomnia, fatigue, night sweats, and  change in appetite. Eyes: Denies redness, blurred vision, diplopia, discharge, itchy, watery eyes.  ENT: Denies discharge, congestion, post nasal drip, epistaxis, sore throat, earache, hearing loss, dental pain, Tinnitus, Vertigo, Sinus pain, snoring.  Cardio: Denies chest pain, palpitations, irregular heartbeat, syncope, dyspnea, diaphoresis, orthopnea, PND, claudication, edema Respiratory: denies cough, dyspnea, DOE, pleurisy, hoarseness, laryngitis, wheezing.  Gastrointestinal: Denies dysphagia, heartburn, reflux, water brash, pain, cramps, nausea, vomiting, bloating, diarrhea, constipation, hematemesis, melena, hematochezia, jaundice, hemorrhoids Genitourinary: Denies dysuria, frequency, urgency, nocturia, hesitancy, discharge, hematuria, flank  pain Breast: Breast lumps, nipple discharge, bleeding.  Musculoskeletal: Denies arthralgia, myalgia, stiffness, Jt. Swelling, pain, limp, and strain/sprain. Denies falls. Skin: Denies puritis, rash, hives, warts, acne, eczema, changing in skin lesion Neuro: No weakness, tremor, incoordination, spasms, paresthesia, pain Psychiatric: Denies confusion, memory loss, sensory loss. Denies Depression. Endocrine: Denies change in weight, skin, hair change, nocturia, and paresthesia, diabetic polys, visual blurring, hyper / hypo glycemic episodes.  Heme/Lymph: No excessive bleeding, bruising, enlarged lymph nodes.  Physical Exam  BP 128/80  Pulse 52  Temp(Src) 98.2 F (36.8 C) (Temporal)  Resp 16  Ht 5' 5.5" (1.664 m)  Wt 176 lb 12.8 oz (80.196 kg)  BMI 28.96 kg/m2  General Appearance: Well nourished and in no apparent distress. Eyes: PERRLA, EOMs, conjunctiva no swelling or erythema, normal fundi and vessels. Sinuses: No frontal/maxillary tenderness ENT/Mouth: EACs patent / TMs  nl. Nares clear without erythema, swelling, mucoid exudates. Oral hygiene is good. No erythema, swelling, or exudate. Tongue normal, non-obstructing. Tonsils not swollen or erythematous. Hearing normal.  Neck: Supple, thyroid normal. No bruits, nodes or JVD. Respiratory: Respiratory effort normal.  BS equal and clear bilateral without rales, rhonci, wheezing or stridor. Cardio: Heart sounds are normal with regular rate and rhythm and no murmurs, rubs or gallops. Peripheral pulses are normal and equal bilaterally without edema. No aortic or femoral bruits. Chest: symmetric with normal excursions and percussion. Breasts: Symmetric, without lumps, nipple discharge, retractions, or fibrocystic changes.  Abdomen: Flat, soft, with bowl sounds. Nontender, no guarding, rebound, hernias, masses, or organomegaly.  Lymphatics: Non tender without lymphadenopathy.  Genitourinary:  Musculoskeletal: Full ROM all peripheral  extremities, joint stability, 5/5 strength, and normal gait. Skin: Warm and dry without rashes, lesions, cyanosis, clubbing or  ecchymosis.  Neuro: Cranial nerves intact, reflexes equal bilaterally. Normal muscle tone, no cerebellar symptoms. Sensation intact.  Pysch: Awake and oriented X 3, normal affect, Insight and Judgment appropriate.   Assessment and Plan  1. Annual Screening Examination 2. Hypertension, screening 3. pAfib, Hx 4. Hyperlipidemia 5. Vitamin D Deficiency  Continue prudent diet as discussed, weight control, BP monitoring, regular exercise, and medications. Discussed med's effects and SE's. Screening labs and tests as requested with regular follow-up as recommended.

## 2013-12-07 ENCOUNTER — Encounter: Payer: Self-pay | Admitting: Internal Medicine

## 2013-12-07 ENCOUNTER — Ambulatory Visit (INDEPENDENT_AMBULATORY_CARE_PROVIDER_SITE_OTHER): Payer: 59 | Admitting: Internal Medicine

## 2013-12-07 VITALS — BP 128/80 | HR 52 | Temp 98.2°F | Resp 16 | Ht 65.5 in | Wt 176.8 lb

## 2013-12-07 DIAGNOSIS — R7401 Elevation of levels of liver transaminase levels: Secondary | ICD-10-CM

## 2013-12-07 DIAGNOSIS — E559 Vitamin D deficiency, unspecified: Secondary | ICD-10-CM

## 2013-12-07 DIAGNOSIS — Z0000 Encounter for general adult medical examination without abnormal findings: Secondary | ICD-10-CM

## 2013-12-07 DIAGNOSIS — R740 Nonspecific elevation of levels of transaminase and lactic acid dehydrogenase [LDH]: Secondary | ICD-10-CM

## 2013-12-07 DIAGNOSIS — Z113 Encounter for screening for infections with a predominantly sexual mode of transmission: Secondary | ICD-10-CM

## 2013-12-07 DIAGNOSIS — Z111 Encounter for screening for respiratory tuberculosis: Secondary | ICD-10-CM

## 2013-12-07 DIAGNOSIS — Z1212 Encounter for screening for malignant neoplasm of rectum: Secondary | ICD-10-CM

## 2013-12-07 DIAGNOSIS — I10 Essential (primary) hypertension: Secondary | ICD-10-CM

## 2013-12-07 LAB — CBC WITH DIFFERENTIAL/PLATELET
Basophils Absolute: 0.1 10*3/uL (ref 0.0–0.1)
Basophils Relative: 1 % (ref 0–1)
Eosinophils Absolute: 0.1 10*3/uL (ref 0.0–0.7)
Eosinophils Relative: 1 % (ref 0–5)
HCT: 40 % (ref 36.0–46.0)
Hemoglobin: 13.5 g/dL (ref 12.0–15.0)
Lymphocytes Relative: 35 % (ref 12–46)
Lymphs Abs: 2.9 10*3/uL (ref 0.7–4.0)
MCH: 29.4 pg (ref 26.0–34.0)
MCHC: 33.8 g/dL (ref 30.0–36.0)
MCV: 87.1 fL (ref 78.0–100.0)
Monocytes Absolute: 0.5 10*3/uL (ref 0.1–1.0)
Monocytes Relative: 6 % (ref 3–12)
Neutro Abs: 4.8 10*3/uL (ref 1.7–7.7)
Neutrophils Relative %: 57 % (ref 43–77)
Platelets: 275 10*3/uL (ref 150–400)
RBC: 4.59 MIL/uL (ref 3.87–5.11)
RDW: 14.1 % (ref 11.5–15.5)
WBC: 8.4 10*3/uL (ref 4.0–10.5)

## 2013-12-08 ENCOUNTER — Other Ambulatory Visit: Payer: Self-pay | Admitting: Internal Medicine

## 2013-12-08 LAB — BASIC METABOLIC PANEL WITH GFR
BUN: 11 mg/dL (ref 6–23)
CO2: 32 mEq/L (ref 19–32)
Calcium: 9.4 mg/dL (ref 8.4–10.5)
Chloride: 102 mEq/L (ref 96–112)
Creat: 0.75 mg/dL (ref 0.50–1.10)
GFR, Est African American: >89 mL/min
GFR, Est Non African American: >89 mL/min
Glucose, Bld: 88 mg/dL (ref 70–99)
Potassium: 4.2 mEq/L (ref 3.5–5.3)
Sodium: 139 mEq/L (ref 135–145)

## 2013-12-08 LAB — LIPID PANEL
Cholesterol: 185 mg/dL (ref 0–200)
HDL: 56 mg/dL (ref >39)
LDL Cholesterol: 105 mg/dL — ABNORMAL HIGH (ref 0–99)
Total CHOL/HDL Ratio: 3.3 Ratio
Triglycerides: 118 mg/dL (ref <150)
VLDL: 24 mg/dL (ref 0–40)

## 2013-12-08 LAB — INSULIN, FASTING: Insulin fasting, serum: 5.7 u[IU]/mL (ref 2.0–19.6)

## 2013-12-08 LAB — MICROALBUMIN / CREATININE URINE RATIO
Creatinine, Urine: 30.4 mg/dL
Microalb Creat Ratio: 16.4 mg/g (ref 0.0–30.0)
Microalb, Ur: 0.5 mg/dL (ref 0.00–1.89)

## 2013-12-08 LAB — URINALYSIS, MICROSCOPIC ONLY
Bacteria, UA: NONE SEEN
Casts: NONE SEEN
Crystals: NONE SEEN
Squamous Epithelial / LPF: NONE SEEN

## 2013-12-08 LAB — HEPATIC FUNCTION PANEL
ALT: 9 U/L (ref 0–35)
AST: 13 U/L (ref 0–37)
Albumin: 4.7 g/dL (ref 3.5–5.2)
Alkaline Phosphatase: 61 U/L (ref 39–117)
Bilirubin, Direct: 0.1 mg/dL (ref 0.0–0.3)
Indirect Bilirubin: 0.6 mg/dL (ref 0.2–1.2)
Total Bilirubin: 0.7 mg/dL (ref 0.2–1.2)
Total Protein: 7.2 g/dL (ref 6.0–8.3)

## 2013-12-08 LAB — RPR

## 2013-12-08 LAB — HEMOGLOBIN A1C
Hgb A1c MFr Bld: 5.4 % (ref <5.7)
Mean Plasma Glucose: 108 mg/dL (ref <117)

## 2013-12-08 LAB — HIV ANTIBODY (ROUTINE TESTING W REFLEX): HIV 1&2 Ab, 4th Generation: NONREACTIVE

## 2013-12-08 LAB — HEPATITIS B SURFACE ANTIBODY,QUALITATIVE: Hep B S Ab: NEGATIVE

## 2013-12-08 LAB — VITAMIN D 25 HYDROXY (VIT D DEFICIENCY, FRACTURES): Vit D, 25-Hydroxy: 78 ng/mL (ref 30–89)

## 2013-12-08 LAB — VITAMIN B12: Vitamin B-12: 1085 pg/mL — ABNORMAL HIGH (ref 211–911)

## 2013-12-08 LAB — HEPATITIS C ANTIBODY: HCV Ab: NEGATIVE

## 2013-12-08 LAB — MAGNESIUM: Magnesium: 2.3 mg/dL (ref 1.5–2.5)

## 2013-12-08 LAB — TSH: TSH: 1.298 u[IU]/mL (ref 0.350–4.500)

## 2013-12-08 LAB — IRON AND TIBC
%SAT: 19 % — ABNORMAL LOW (ref 20–55)
Iron: 62 ug/dL (ref 42–145)
TIBC: 333 ug/dL (ref 250–470)
UIBC: 271 ug/dL (ref 125–400)

## 2013-12-08 LAB — HEPATITIS A ANTIBODY, TOTAL: Hep A Total Ab: NONREACTIVE

## 2013-12-08 LAB — HEPATITIS B CORE ANTIBODY, TOTAL: Hep B Core Total Ab: NONREACTIVE

## 2013-12-10 LAB — HEPATITIS B E ANTIBODY: Hepatitis Be Antibody: REACTIVE — AB

## 2013-12-10 LAB — TB SKIN TEST
Induration: 0 mm
TB Skin Test: NEGATIVE

## 2014-01-13 ENCOUNTER — Other Ambulatory Visit: Payer: Self-pay | Admitting: Internal Medicine

## 2014-05-27 ENCOUNTER — Other Ambulatory Visit: Payer: Self-pay | Admitting: Physician Assistant

## 2014-06-09 ENCOUNTER — Ambulatory Visit (INDEPENDENT_AMBULATORY_CARE_PROVIDER_SITE_OTHER): Payer: 59 | Admitting: Internal Medicine

## 2014-06-09 ENCOUNTER — Encounter: Payer: Self-pay | Admitting: Internal Medicine

## 2014-06-09 VITALS — BP 122/74 | HR 64 | Temp 97.6°F | Resp 16 | Ht 65.5 in | Wt 190.6 lb

## 2014-06-09 DIAGNOSIS — E782 Mixed hyperlipidemia: Secondary | ICD-10-CM

## 2014-06-09 DIAGNOSIS — Z79899 Other long term (current) drug therapy: Secondary | ICD-10-CM

## 2014-06-09 DIAGNOSIS — E559 Vitamin D deficiency, unspecified: Secondary | ICD-10-CM

## 2014-06-09 DIAGNOSIS — E039 Hypothyroidism, unspecified: Secondary | ICD-10-CM

## 2014-06-09 DIAGNOSIS — R7309 Other abnormal glucose: Secondary | ICD-10-CM

## 2014-06-09 DIAGNOSIS — M2550 Pain in unspecified joint: Secondary | ICD-10-CM

## 2014-06-09 DIAGNOSIS — I10 Essential (primary) hypertension: Secondary | ICD-10-CM

## 2014-06-09 MED ORDER — TURMERIC 500 MG PO CAPS: ORAL_CAPSULE | ORAL | Status: DC

## 2014-06-09 NOTE — Progress Notes (Signed)
Patient ID: Monica Morgan, female   DOB: 1960-07-16, 54 y.o.   MRN: [id number]   This very nice 54 y.o. DWF presents for 3 month follow up with Hypertension, Hyperlipidemia, Pre-Diabetes and Vitamin D Deficiency.    Patient is monitored for labile HTN and hx/o pAfib which has been quiescent over the last year and not had to use her prn MULTaq. & BP has been controlled at home. Today's BP: 122/74 mmHg. Patient has had no complaints of any cardiac type chest pain, palpitations, dyspnea/orthopnea/PND, dizziness, claudication, or dependent edema.   Hyperlipidemia is controlled with diet. Last Lipids were near goal - Total Chol 185; HDL  56; LDL  105*; Triglycerides 118 on 12/07/2013.   Also, the patient has Morbid Obesity (BMI31+) and is screened for prediabetes and has had no symptoms of reactive hypoglycemia, diabetic polys, paresthesias or visual blurring.  Last A1c was 5.4% on  12/07/2013.    Further, the patient also has history of Vitamin D Deficiency and supplements vitamin D without any suspected side-effects. Last vitamin D was 78 on  12/07/2013.  Medication Sig  . aspirin 81 MG tablet Take 81 mg by mouth daily.  Marland Kitchen augmented betamethasone dipropionate (DIPROLENE-AF) 0.05 % cream APPLY TWICE DIALY FOR PSORIASIS(NOT FOR FACE)  . betamethasone dipropionate (DIPROLENE) 0.05 % cream Apply 1 application topically as needed.  . Cholecalciferol (VITAMIN D PO) Take 5,000 Units by mouth daily.  Marland Kitchen ibuprofen (ADVIL,MOTRIN) 800 MG tablet Take 1 tablet (800 mg total) by mouth every 6 (six) hours as needed.  Marland Kitchen levothyroxine (SYNTHROID, LEVOTHROID) 50 MCG tablet TAKE 1 TABLET (50 MCG TOTAL) BY MOUTH DAILY.  . montelukast (SINGULAIR) 10 MG tablet TAKE 1 TABLET BY MOUTH EVERY DAY  . MULTAQ 400 MG tablet TAKE 0.5 TABLETS  DAILY - PRN  . NONFORMULARY OR COMPOUNDED ITEM 3 (three) times a week. Estriol/testosterone 0.25-0.25 suppository compound  . NONFORMULARY OR COMPOUNDED ITEM Place onto the skin daily. Biest  8/2 HRT compound  . OVER THE COUNTER MEDICATION Multivitamin and diet supplement  . PROGESTERONE, VAGINAL, 4 % GEL Place onto the skin. Days 1-25 of each month   Allergies  Allergen Reactions  . Lipitor [Atorvastatin] Other (See Comments)    "neck pain"  . Rythmol [Propafenone] Other (See Comments)    "weight gain"  . Sulfa Drugs Cross Reactors Other (See Comments)    "soreness all over"   PMHx:   Past Medical History  Diagnosis Date  . Hyperlipidemia   . Hypertension   . Arrhythmia     afib  . Melanoma     leg  . Rosacea   . Psoriasis   . Vitamin D deficiency    Immunization History  Administered Date(s) Administered  . PPD Test 12/07/2013   Past Surgical History  Procedure Laterality Date  . Skin cancer excision      leg  . Cesarean section with bilateral tubal ligation    . Cesarean section    . Cervical ablation     FHx:    Reviewed / unchanged  SHx:    Reviewed / unchanged  Systems Review:  Constitutional: Denies fever, chills, wt changes, headaches, insomnia, fatigue, night sweats, change in appetite. Eyes: Denies redness, blurred vision, diplopia, discharge, itchy, watery eyes.  ENT: Denies discharge, congestion, post nasal drip, epistaxis, sore throat, earache, hearing loss, dental pain, tinnitus, vertigo, sinus pain, snoring.  CV: Denies chest pain, palpitations, irregular heartbeat, syncope, dyspnea, diaphoresis, orthopnea, PND, claudication or edema. Respiratory: denies cough, dyspnea, DOE,  pleurisy, hoarseness, laryngitis, wheezing.  Gastrointestinal: Denies dysphagia, odynophagia, heartburn, reflux, water brash, abdominal pain or cramps, nausea, vomiting, bloating, diarrhea, constipation, hematemesis, melena, hematochezia  or hemorrhoids. Genitourinary: Denies dysuria, frequency, urgency, nocturia, hesitancy, discharge, hematuria or flank pain. Musculoskeletal: Denies arthralgias, myalgias, stiffness, jt. swelling, pain, limping or strain/sprain.  Skin:  Denies pruritus, rash, hives, warts, acne, eczema or change in skin lesion(s). Neuro: No weakness, tremor, incoordination, spasms, paresthesia or pain. Psychiatric: Denies confusion, memory loss or sensory loss. Endo: Denies change in weight, skin or hair change.  Heme/Lymph: No excessive bleeding, bruising or enlarged lymph nodes.  Physical Exam  BP 122/74   Pulse 64  Temp 97.6 F   Resp 16  Ht 5' 5.5"   Wt 190 lb 9.6 oz     BMI 31.22   Appears well nourished and in no distress. Eyes: PERRLA, EOMs, conjunctiva no swelling or erythema. Sinuses: No frontal/maxillary tenderness ENT/Mouth: EAC's clear, TM's nl w/o erythema, bulging. Nares clear w/o erythema, swelling, exudates. Oropharynx clear without erythema or exudates. Oral hygiene is good. Tongue normal, non obstructing. Hearing intact.  Neck: Supple. Thyroid nl. Car 2+/2+ without bruits, nodes or JVD. Chest: Respirations nl with BS clear & equal w/o rales, rhonchi, wheezing or stridor.  Cor: Heart sounds normal w/ regular rate and rhythm without sig. murmurs, gallops, clicks, or rubs. Peripheral pulses normal and equal  without edema.  Abdomen: Soft & bowel sounds normal. Non-tender w/o guarding, rebound, hernias, masses, or organomegaly.  Lymphatics: Unremarkable.  Musculoskeletal: Full ROM all peripheral extremities, joint stability, 5/5 strength, and normal gait.  Skin: Warm, dry without exposed rashes, lesions or ecchymosis apparent.  Neuro: Cranial nerves intact, reflexes equal bilaterally. Sensory-motor testing grossly intact. Tendon reflexes grossly intact.  Pysch: Alert & oriented x 3.  Insight and judgement nl & appropriate. No ideations.  Assessment and Plan:  1. Elevated BP, hx/o  - TSH  2. Mixed hyperlipidemia  - Lipid panel  3. Abnormal glucose  - Hemoglobin A1c - Insulin, fasting  4. Vitamin D deficiency  - Vit D  25 hydroxy (rtn osteoporosis monitoring)  5. Hypothyroidism, unspecified hypothyroidism  type   6. Medication management  - CBC with Differential/Platelet - BASIC METABOLIC PANEL WITH GFR - Hepatic function panel - Magnesium   Recommended regular exercise, BP monitoring, weight control, and discussed med and SE's. Recommended labs to assess and monitor clinical status. Further disposition pending results of labs.

## 2014-06-09 NOTE — Patient Instructions (Signed)

## 2014-06-10 LAB — CBC WITH DIFFERENTIAL/PLATELET
Basophils Absolute: 0 10*3/uL (ref 0.0–0.1)
Basophils Relative: 0 % (ref 0–1)
Eosinophils Absolute: 0.2 10*3/uL (ref 0.0–0.7)
Eosinophils Relative: 2 % (ref 0–5)
HCT: 38.4 % (ref 36.0–46.0)
Hemoglobin: 12.8 g/dL (ref 12.0–15.0)
Lymphocytes Relative: 37 % (ref 12–46)
Lymphs Abs: 3.8 10*3/uL (ref 0.7–4.0)
MCH: 29.2 pg (ref 26.0–34.0)
MCHC: 33.3 g/dL (ref 30.0–36.0)
MCV: 87.7 fL (ref 78.0–100.0)
MPV: 9.9 fL (ref 8.6–12.4)
Monocytes Absolute: 0.7 10*3/uL (ref 0.1–1.0)
Monocytes Relative: 7 % (ref 3–12)
Neutro Abs: 5.6 10*3/uL (ref 1.7–7.7)
Neutrophils Relative %: 54 % (ref 43–77)
Platelets: 286 10*3/uL (ref 150–400)
RBC: 4.38 MIL/uL (ref 3.87–5.11)
RDW: 13.7 % (ref 11.5–15.5)
WBC: 10.4 10*3/uL (ref 4.0–10.5)

## 2014-06-10 LAB — BASIC METABOLIC PANEL WITH GFR
BUN: 15 mg/dL (ref 6–23)
CO2: 27 mEq/L (ref 19–32)
Calcium: 9.4 mg/dL (ref 8.4–10.5)
Chloride: 105 mEq/L (ref 96–112)
Creat: 0.83 mg/dL (ref 0.50–1.10)
GFR, Est African American: >89 mL/min
GFR, Est Non African American: 80 mL/min
Glucose, Bld: 90 mg/dL (ref 70–99)
Potassium: 4.1 mEq/L (ref 3.5–5.3)
Sodium: 140 mEq/L (ref 135–145)

## 2014-06-10 LAB — MAGNESIUM: Magnesium: 2.1 mg/dL (ref 1.5–2.5)

## 2014-06-10 LAB — LIPID PANEL
Cholesterol: 186 mg/dL (ref 0–200)
HDL: 47 mg/dL (ref >=46)
LDL Cholesterol: 77 mg/dL (ref 0–99)
Total CHOL/HDL Ratio: 4 Ratio
Triglycerides: 311 mg/dL — ABNORMAL HIGH (ref <150)
VLDL: 62 mg/dL — ABNORMAL HIGH (ref 0–40)

## 2014-06-10 LAB — HEPATIC FUNCTION PANEL
ALT: 10 U/L (ref 0–35)
AST: 12 U/L (ref 0–37)
Albumin: 4.4 g/dL (ref 3.5–5.2)
Alkaline Phosphatase: 56 U/L (ref 39–117)
Bilirubin, Direct: <0.1 mg/dL (ref 0.0–0.3)
Total Bilirubin: 0.3 mg/dL (ref 0.2–1.2)
Total Protein: 6.8 g/dL (ref 6.0–8.3)

## 2014-06-10 LAB — HEMOGLOBIN A1C
Hgb A1c MFr Bld: 5.6 % (ref <5.7)
Mean Plasma Glucose: 114 mg/dL (ref <117)

## 2014-06-10 LAB — VITAMIN D 25 HYDROXY (VIT D DEFICIENCY, FRACTURES): Vit D, 25-Hydroxy: 55 ng/mL (ref 30–100)

## 2014-06-10 LAB — INSULIN, FASTING: Insulin fasting, serum: 14.1 u[IU]/mL (ref 2.0–19.6)

## 2014-06-10 LAB — TSH: TSH: 2.77 u[IU]/mL (ref 0.350–4.500)

## 2014-10-13 ENCOUNTER — Other Ambulatory Visit: Payer: Self-pay | Admitting: Internal Medicine

## 2014-10-14 ENCOUNTER — Ambulatory Visit (INDEPENDENT_AMBULATORY_CARE_PROVIDER_SITE_OTHER): Payer: 59 | Admitting: Physician Assistant

## 2014-10-14 ENCOUNTER — Encounter: Payer: Self-pay | Admitting: Physician Assistant

## 2014-10-14 VITALS — BP 160/82 | HR 60 | Temp 97.7°F | Resp 16 | Wt 198.0 lb

## 2014-10-14 DIAGNOSIS — B029 Zoster without complications: Secondary | ICD-10-CM

## 2014-10-14 MED ORDER — PREDNISONE 20 MG PO TABS
ORAL_TABLET | ORAL | Status: AC
Start: 2014-10-14 — End: 2014-10-25

## 2014-10-14 MED ORDER — HYDROCODONE-ACETAMINOPHEN 5-325 MG PO TABS: 1.0000 | ORAL_TABLET | Freq: Four times a day (QID) | ORAL | Status: DC | PRN

## 2014-10-14 MED ORDER — PREGABALIN 50 MG PO CAPS: 50.0000 mg | ORAL_CAPSULE | Freq: Three times a day (TID) | ORAL | Status: DC

## 2014-10-14 MED ORDER — ACYCLOVIR 400 MG PO TABS: ORAL_TABLET | ORAL | Status: DC

## 2014-10-14 NOTE — Patient Instructions (Addendum)
Shingles Shingles (herpes zoster) is an infection that is caused by the same virus that causes chickenpox (varicella). The infection causes a painful skin rash and fluid-filled blisters, which eventually break open, crust over, and heal. It may occur in any area of the body, but it usually affects only one side of the body or face. The pain of shingles usually lasts about 1 month. However, some people with shingles may develop long-term (chronic) pain in the affected area of the body. Shingles often occurs many years after the person had chickenpox. It is more common:  In people older than 50 years.  In people with weakened immune systems, such as those with HIV, AIDS, or cancer.  In people taking medicines that weaken the immune system, such as transplant medicines.  In people under great stress. CAUSES  Shingles is caused by the varicella zoster virus (VZV), which also causes chickenpox. After a person is infected with the virus, it can remain in the person's body for years in an inactive state (dormant). To cause shingles, the virus reactivates and breaks out as an infection in a nerve root. The virus can be spread from person to person (contagious) through contact with open blisters of the shingles rash. It will only spread to people who have not had chickenpox. When these people are exposed to the virus, they may develop chickenpox. They will not develop shingles. Once the blisters scab over, the person is no longer contagious and cannot spread the virus to others. SIGNS AND SYMPTOMS  Shingles shows up in stages. The initial symptoms may be pain, itching, and tingling in an area of the skin. This pain is usually described as burning, stabbing, or throbbing.In a few days or weeks, a painful red rash will appear in the area where the pain, itching, and tingling were felt. The rash is usually on one side of the body in a band or belt-like pattern. Then, the rash usually turns into fluid-filled  blisters. They will scab over and dry up in approximately 2-3 weeks. Flu-like symptoms may also occur with the initial symptoms, the rash, or the blisters. These may include:  Fever.  Chills.  Headache.  Upset stomach. DIAGNOSIS  Your health care provider will perform a skin exam to diagnose shingles. Skin scrapings or fluid samples may also be taken from the blisters. This sample will be examined under a microscope or sent to a lab for further testing. TREATMENT  There is no specific cure for shingles. Your health care provider will likely prescribe medicines to help you manage the pain, recover faster, and avoid long-term problems. This may include antiviral drugs, anti-inflammatory drugs, and pain medicines. HOME CARE INSTRUCTIONS   Take a cool bath or apply cool compresses to the area of the rash or blisters as directed. This may help with the pain and itching.   Take medicines only as directed by your health care provider.   Rest as directed by your health care provider.  Keep your rash and blisters clean with mild soap and cool water or as directed by your health care provider.  Do not pick your blisters or scratch your rash. Apply an anti-itch cream or numbing creams to the affected area as directed by your health care provider.  Keep your shingles rash covered with a loose bandage (dressing).  Avoid skin contact with:  Babies.   Pregnant women.   Children with eczema.   Elderly people with transplants.   People with chronic illnesses, such as leukemia   or AIDS.   Wear loose-fitting clothing to help ease the pain of material rubbing against the rash.  Keep all follow-up visits as directed by your health care provider.If the area involved is on your face, you may receive a referral for a specialist, such as an eye doctor (ophthalmologist) or an ear, nose, and throat (ENT) doctor. Keeping all follow-up visits will help you avoid eye problems, chronic pain, or  disability.  SEEK IMMEDIATE MEDICAL CARE IF:   You have facial pain, pain around the eye area, or loss of feeling on one side of your face.  You have ear pain or ringing in your ear.  You have loss of taste.  Your pain is not relieved with prescribed medicines.   Your redness or swelling spreads.   You have more pain and swelling.  Your condition is worsening or has changed.   You have a fever. MAKE SURE YOU:  Understand these instructions.  Will watch your condition.  Will get help right away if you are not doing well or get worse. Document Released: 04/02/2005 Document Revised: 08/17/2013 Document Reviewed: 11/15/2011 [facility] Patient Information 2015 [place], [place]. This information is not intended to replace advice given to you by your health care provider. Make sure you discuss any questions you have with your health care provider.   Acyclovir tablets or capsules What is this medicine? ACYCLOVIR (ay SYE kloe veer) is an antiviral medicine. It is used to treat or prevent infections caused by certain kinds of viruses. Examples of these infections include herpes and shingles. This medicine will not cure herpes. This medicine may be used for other purposes; ask your health care provider or pharmacist if you have questions. COMMON BRAND NAME(S): Zovirax What should I tell my health care provider before I take this medicine? They need to know if you have any of these conditions: -kidney disease -an unusual or allergic reaction to acyclovir, ganciclovir, valacyclovir, other medicines, foods, dyes, or preservatives -pregnant or trying to get pregnant -breast-feeding How should I use this medicine? Take this medicine by mouth with a glass of water. Follow the directions on the prescription label. You can take it with or without food. Take your medicine at regular intervals. Do not take your medicine more often than directed. Take all of your medicine as directed even if you  think your are better. Do not skip doses or stop your medicine early. Talk to your pediatrician regarding the use of this medicine in children. While this drug may be prescribed for selected conditions, precautions do apply. Overdosage: If you think you have taken too much of this medicine contact a poison control center or emergency room at once. NOTE: This medicine is only for you. Do not share this medicine with others. What if I miss a dose? If you miss a dose, take it as soon as you can. If it is almost time for your next dose, take only that dose. Do not take double or extra doses. What may interact with this medicine? -probenecid This list may not describe all possible interactions. Give your health care provider a list of all the medicines, herbs, non-prescription drugs, or dietary supplements you use. Also tell them if you smoke, drink alcohol, or use illegal drugs. Some items may interact with your medicine. What should I watch for while using this medicine? Tell your doctor or health care professional if your symptoms do not improve. This medicine works best when started very early in the course of an  infection. Begin treatment at the first signs of infection. Drink 6 to 8 glasses of water or fluids every day while you are taking this medicine. This will help prevent side effects. You can still pass chickenpox, shingles, or herpes to another person even while you are taking this medicine. Avoid contact with others as directed. Genital herpes is a sexually transmitted disease. Talk to your doctor about how to stop the spread of infection. What side effects may I notice from receiving this medicine? Side effects that you should report to your doctor or health care professional as soon as possible: -allergic reactions like skin rash, itching or hives, swelling of the face, lips, or tongue -chest pain -confusion, hallucinations, tremor -dark urine -increased sensitivity to the  sun -redness, blistering, peeling or loosening of the skin, including inside the mouth -seizures -trouble passing urine or change in the amount of urine -unusual bleeding or bruising, or pinpoint red spots on the skin -unusually weak or tired -yellowing of the eyes or skin Side effects that usually do not require medical attention (report to your doctor or health care professional if they continue or are bothersome): -diarrhea -fever -headache -nausea, vomiting -stomach upset This list may not describe all possible side effects. Call your doctor for medical advice about side effects. You may report side effects to FDA at 1-800-FDA-1088. Where should I keep my medicine? Keep out of the reach of children. Store at room temperature between 15 and 25 degrees C (59 and 77 degrees F). Throw away any unused medicine after the expiration date. NOTE: This sheet is a summary. It may not cover all possible information. If you have questions about this medicine, talk to your doctor, pharmacist, or health care provider.  2015, [employer]/Gold Standard. (2007-06-18 13:15:46)  Can take the lyrica samples for nerve pain. It can make you sleepy so we suggest trying it at night first and please plan to not drive or do anything strenuous. Also please do not take this medication with alcohol.  Start out 1 pill at night before bed, can increase to 2 pills at night before bed. Please call the office if you have any side effects.   Can take 3 pills a day however you would like  Some examples: - 1 breakfast, lunch, bedtime. - 1 at breakfast, 2 at bed time  How should I use this medicine? Take this medicine by mouth with a glass of water. Follow the directions on the prescription label. You can take this medicine with or without food. Take your doses at regular intervals. Do not take your medicine more often than directed. Do not stop taking except on your doctor's advice.  What if I miss a dose? If you miss a  dose, take it as soon as you can. If it is almost time for your next dose, take only that dose. Do not take double or extra doses.  What should I watch for while using this medicine? Tell your doctor or healthcare professional if your symptoms do not start to get better or if they get worse.   You may get drowsy or dizzy. Do not drive, use machinery, or do anything that needs mental alertness until you know how this medicine affects you. Do not stand or sit up quickly, especially if you are an older patient. This reduces the risk of dizzy or fainting spells. Alcohol may interfere with the effect of this medicine. Avoid alcoholic drinks. If you have a heart condition, like congestive heart failure,  and notice that you are retaining water and have swelling in your hands or feet, contact your health care provider immediately.  What side effects may I notice from receiving this medicine? Side effects that you should report to your doctor or health care professional as soon as possible and are very rare: -allergic reactions like skin rash, itching or hives, swelling of the face, lips, or tongue -breathing problems -changes in vision -jerking or unusual movements of any part of your body -suicidal thoughts or other mood changes -swelling of the ankles, feet, hands -unusual bruising or bleeding  Side effects that usually do not require medical attention (Report these to your doctor or health care professional if they continue or are bothersome.): -dizziness -drowsiness -dry mouth -nausea -tremors

## 2014-10-14 NOTE — Progress Notes (Signed)
Subjective:    Patient ID: Monica Morgan, female    DOB: [birth year], 54 y.o.   MRN: [id number]  HPI 54 y.o. WF with history of HTN, chol, preDM, pAfib presents with possible shingles. She had shingles 3.5 years ago in Feb, was in the exact same place. Has place on her left chest with a bump and then vesicles and is burning/painful, has some fatigue. She has psoriasis and is on topical medication.   States BP is normally in a good range, up today due to rash/pain.   Blood pressure 160/82, pulse 60, temperature 97.7 F (36.5 C), resp. rate 16, weight 198 lb (89.812 kg).  Current Outpatient Prescriptions on File Prior to Visit  Medication Sig Dispense Refill  . aspirin 81 MG tablet Take 81 mg by mouth daily.    Marland Kitchen augmented betamethasone dipropionate (DIPROLENE-AF) 0.05 % cream APPLY TWICE DIALY FOR PSORIASIS(NOT FOR FACE) 15 g 2  . betamethasone dipropionate (DIPROLENE) 0.05 % cream Apply 1 application topically as needed.    . Cholecalciferol (VITAMIN D PO) Take 5,000 Units by mouth daily.    Marland Kitchen ibuprofen (ADVIL,MOTRIN) 800 MG tablet Take 1 tablet (800 mg total) by mouth every 6 (six) hours as needed. 60 tablet 2  . levothyroxine (SYNTHROID, LEVOTHROID) 50 MCG tablet TAKE 1 TABLET (50 MCG TOTAL) BY MOUTH DAILY. 30 tablet 3  . montelukast (SINGULAIR) 10 MG tablet TAKE 1 TABLET BY MOUTH EVERY DAY 90 tablet 9  . MULTAQ 400 MG tablet TAKE 0.5 TABLETS (200 MG TOTAL) BY MOUTH DAILY. 45 tablet 3  . NONFORMULARY OR COMPOUNDED ITEM 3 (three) times a week. Estriol/testosterone 0.25-0.25 suppository compound    . NONFORMULARY OR COMPOUNDED ITEM Place onto the skin daily. Biest 8/2 HRT compound    . OVER THE COUNTER MEDICATION Multivitamin and diet supplement    . OVER THE COUNTER MEDICATION Tumeric 2 capsules daily    . PROGESTERONE, VAGINAL, 4 % GEL Place onto the skin. Days 1-25 of each month    . Turmeric 500 MG CAPS Take 1 tablet 2 x day     No current facility-administered medications on file  prior to visit.    Past Medical History  Diagnosis Date  . Hyperlipidemia   . Hypertension   . Arrhythmia     afib  . Melanoma     leg  . Rosacea   . Psoriasis   . Vitamin D deficiency     Review of Systems  Constitutional: Positive for fatigue. Negative for fever and chills.  HENT: Negative.   Respiratory: Negative.   Cardiovascular: Negative.   Gastrointestinal: Negative.  Negative for diarrhea.  Genitourinary: Negative.   Musculoskeletal: Negative.  Negative for arthralgias.  Skin: Positive for rash (with vesicles left chest). Negative for color change and wound.  Neurological: Negative.  Negative for dizziness.      Objective:   Physical Exam  Constitutional: She is oriented to person, place, and time. She appears well-developed and well-nourished.  HENT:  Head: Normocephalic and atraumatic.  Eyes: Conjunctivae are normal. Pupils are equal, round, and reactive to light.  Neck: Normal range of motion. Neck supple.  Cardiovascular: Normal rate and regular rhythm.   Pulmonary/Chest: Effort normal and breath sounds normal.  Abdominal: Soft. Bowel sounds are normal. There is no tenderness.  Musculoskeletal: Normal range of motion. She exhibits no edema or tenderness.  Lymphadenopathy:    She has no cervical adenopathy.  Neurological: She is alert and oriented to person, place, and time. She  has normal reflexes.  Skin: Skin is warm and dry. Rash noted.  Erythematous vesicles along dermatome left T1-T2      Assessment & Plan:  Shingles left sided dermatome T1-T2 This is the 2nd times she has had it, she has psorasis and is now on topical medication but would suggest getting shingles vaccine before getting on oral agent/prevent future breakouts, will treat with acyclovir, prednisone, Lyrica samples, norco 5mg  #30 NR.

## 2014-10-27 ENCOUNTER — Other Ambulatory Visit: Payer: Self-pay | Admitting: Internal Medicine

## 2014-10-27 DIAGNOSIS — Z1231 Encounter for screening mammogram for malignant neoplasm of breast: Secondary | ICD-10-CM

## 2014-12-01 ENCOUNTER — Ambulatory Visit (HOSPITAL_COMMUNITY)
Admission: RE | Admit: 2014-12-01 | Discharge: 2014-12-01 | Disposition: A | Discharge status: Home / Self Care | Payer: 59 | Source: Ambulatory Visit | Attending: Internal Medicine | Admitting: Internal Medicine

## 2014-12-01 DIAGNOSIS — Z1231 Encounter for screening mammogram for malignant neoplasm of breast: Secondary | ICD-10-CM | POA: Diagnosis present

## 2014-12-13 ENCOUNTER — Encounter: Payer: Self-pay | Admitting: Internal Medicine

## 2014-12-22 ENCOUNTER — Encounter: Payer: Self-pay | Admitting: Internal Medicine

## 2014-12-22 ENCOUNTER — Ambulatory Visit (INDEPENDENT_AMBULATORY_CARE_PROVIDER_SITE_OTHER): Payer: 59 | Admitting: Internal Medicine

## 2014-12-22 VITALS — BP 126/78 | HR 72 | Temp 97.3°F | Resp 16 | Ht 65.25 in | Wt 199.8 lb

## 2014-12-22 DIAGNOSIS — R7309 Other abnormal glucose: Secondary | ICD-10-CM

## 2014-12-22 DIAGNOSIS — S233XXS Sprain of ligaments of thoracic spine, sequela: Secondary | ICD-10-CM

## 2014-12-22 DIAGNOSIS — R5383 Other fatigue: Secondary | ICD-10-CM

## 2014-12-22 DIAGNOSIS — L409 Psoriasis, unspecified: Secondary | ICD-10-CM | POA: Insufficient documentation

## 2014-12-22 DIAGNOSIS — Z79899 Other long term (current) drug therapy: Secondary | ICD-10-CM

## 2014-12-22 DIAGNOSIS — E559 Vitamin D deficiency, unspecified: Secondary | ICD-10-CM

## 2014-12-22 DIAGNOSIS — Z20828 Contact with and (suspected) exposure to other viral communicable diseases: Secondary | ICD-10-CM

## 2014-12-22 DIAGNOSIS — Z0000 Encounter for general adult medical examination without abnormal findings: Secondary | ICD-10-CM | POA: Diagnosis not present

## 2014-12-22 DIAGNOSIS — I480 Paroxysmal atrial fibrillation: Secondary | ICD-10-CM

## 2014-12-22 DIAGNOSIS — L719 Rosacea, unspecified: Secondary | ICD-10-CM | POA: Insufficient documentation

## 2014-12-22 DIAGNOSIS — S29012S Strain of muscle and tendon of back wall of thorax, sequela: Secondary | ICD-10-CM

## 2014-12-22 DIAGNOSIS — E782 Mixed hyperlipidemia: Secondary | ICD-10-CM

## 2014-12-22 DIAGNOSIS — Z1212 Encounter for screening for malignant neoplasm of rectum: Secondary | ICD-10-CM

## 2014-12-22 DIAGNOSIS — Z6833 Body mass index (BMI) 33.0-33.9, adult: Secondary | ICD-10-CM

## 2014-12-22 DIAGNOSIS — Z111 Encounter for screening for respiratory tuberculosis: Secondary | ICD-10-CM | POA: Diagnosis not present

## 2014-12-22 DIAGNOSIS — I10 Essential (primary) hypertension: Secondary | ICD-10-CM

## 2014-12-22 DIAGNOSIS — E039 Hypothyroidism, unspecified: Secondary | ICD-10-CM

## 2014-12-22 LAB — BASIC METABOLIC PANEL WITH GFR
BUN: 18 mg/dL (ref 7–25)
CO2: 27 mmol/L (ref 20–31)
Calcium: 9.9 mg/dL (ref 8.6–10.4)
Chloride: 100 mmol/L (ref 98–110)
Creat: 0.71 mg/dL (ref 0.50–1.05)
GFR, Est African American: >89 mL/min (ref >=60)
GFR, Est Non African American: >89 mL/min (ref >=60)
Glucose, Bld: 78 mg/dL (ref 65–99)
Potassium: 4.2 mmol/L (ref 3.5–5.3)
Sodium: 138 mmol/L (ref 135–146)

## 2014-12-22 LAB — LIPID PANEL
Cholesterol: 207 mg/dL — ABNORMAL HIGH (ref 125–200)
HDL: 38 mg/dL — ABNORMAL LOW (ref >=46)
LDL Cholesterol: 127 mg/dL (ref <130)
Total CHOL/HDL Ratio: 5.4 Ratio — ABNORMAL HIGH (ref <=5.0)
Triglycerides: 211 mg/dL — ABNORMAL HIGH (ref <150)
VLDL: 42 mg/dL — ABNORMAL HIGH (ref <30)

## 2014-12-22 LAB — HEPATIC FUNCTION PANEL
ALT: 15 U/L (ref 6–29)
AST: 18 U/L (ref 10–35)
Albumin: 4.6 g/dL (ref 3.6–5.1)
Alkaline Phosphatase: 48 U/L (ref 33–130)
Bilirubin, Direct: 0.1 mg/dL (ref <=0.2)
Indirect Bilirubin: 0.3 mg/dL (ref 0.2–1.2)
Total Bilirubin: 0.4 mg/dL (ref 0.2–1.2)
Total Protein: 6.9 g/dL (ref 6.1–8.1)

## 2014-12-22 LAB — IRON AND TIBC
%SAT: 22 % (ref 11–50)
Iron: 67 ug/dL (ref 45–160)
TIBC: 308 ug/dL (ref 250–450)
UIBC: 241 ug/dL (ref 125–400)

## 2014-12-22 LAB — MAGNESIUM: Magnesium: 2.3 mg/dL (ref 1.5–2.5)

## 2014-12-22 MED ORDER — IBUPROFEN 800 MG PO TABS: 800.0000 mg | ORAL_TABLET | Freq: Four times a day (QID) | ORAL | Status: DC | PRN

## 2014-12-22 MED ORDER — DRONEDARONE HCL 400 MG PO TABS: ORAL_TABLET | ORAL | Status: DC

## 2014-12-22 MED ORDER — CYCLOBENZAPRINE HCL 10 MG PO TABS: 10.0000 mg | ORAL_TABLET | Freq: Three times a day (TID) | ORAL | Status: DC | PRN

## 2014-12-22 NOTE — Progress Notes (Signed)
Patient ID: Monica Morgan, female   DOB: 1960/12/24, 54 y.o.   MRN: [id number]   Comprehensive Examination  This very nice 54 y.o. DWF presents for complete physical.  Patient has been followed for labile HTN, Hx/o pAfib, screened for Prediabetes & Hyperlipidemia, Hypothyroidism  and Vitamin D Deficiency.     Patient has hx/o labile HTN & pAfib and had been treated/intolerant to Rythmol and then treated with Multaq which she takes infrequently when she has palpitations. Patient's BP has been controlled at home and patient denies any cardiac symptoms as chest pain, palpitations, shortness of breath, dizziness or ankle swelling. Today's BP: 126/78 mmHg      Patient's hyperlipidemia is controlled with diet and medications. Patient denies myalgias or other medication SE's. Last lipids were at goal - Cholesterol 186; HDL 47; LDL 77; but with elevated Triglycerides 311 on 06/09/2014.     Patient has Morbid Obesity (BMI 33+) and is monitored expectantly for prediabetes and patient denies reactive hypoglycemic symptoms, visual blurring, diabetic polys, or paresthesias. Last A1c was 5.6% on 06/09/2014.     Finally, patient has history of Vitamin D Deficiency and last Vitamin D was 55 on2/24/2016.       Medication Sig  . acyclovir (ZOVIRAX) 400 MG tablet Take 1 pill 3 times daily with food and 2 pills at night  . aspirin 81 MG tablet Take 81 mg by mouth daily.  Marland Kitchen augmented betamethasone dipropionate (DIPROLENE-AF) 0.05 % cream APPLY TWICE DIALY FOR PSORIASIS(NOT FOR FACE)  . betamethasone dipropionate (DIPROLENE) 0.05 % cream Apply 1 application topically as needed.  . Cholecalciferol (VITAMIN D PO) Take 5,000 Units by mouth daily.  Marland Kitchen HYDROcodone-acetaminophen (NORCO) 5-325 MG per tablet Take 1 tablet by mouth every 6 (six) hours as needed for moderate pain.  Marland Kitchen ibuprofen (ADVIL,MOTRIN) 800 MG tablet Take 1 tablet (800 mg total) by mouth every 6 (six) hours as needed.  Marland Kitchen levothyroxine (SYNTHROID,  LEVOTHROID) 50 MCG tablet TAKE 1 TABLET (50 MCG TOTAL) BY MOUTH DAILY.  . montelukast (SINGULAIR) 10 MG tablet TAKE 1 TABLET BY MOUTH EVERY DAY  . MULTAQ 400 MG tablet TAKE 0.5 TABLETS (200 MG TOTAL) BY MOUTH DAILY.  . NONFORMULARY OR COMPOUNDED ITEM 3 (three) times a week. Estriol/testosterone 0.25-0.25 suppository compound  . NONFORMULARY OR COMPOUNDED ITEM Place onto the skin daily. Biest 8/2 HRT compound  . OVER THE COUNTER MEDICATION Multivitamin and diet supplement  . OVER THE COUNTER MEDICATION Tumeric 2 capsules daily  . pregabalin (LYRICA) 50 MG capsule Take 1 capsule (50 mg total) by mouth 3 (three) times daily.  Marland Kitchen PROGESTERONE, VAGINAL, 4 % GEL Place onto the skin. Days 1-25 of each month  . Turmeric 500 MG CAPS Take 1 tablet 2 x day   Allergies  Allergen Reactions  . Lipitor [Atorvastatin] Other (See Comments)    "neck pain"  . Rythmol [Propafenone] Other (See Comments)    "weight gain"  . Sulfa Drugs Cross Reactors Other (See Comments)    "soreness all over"   Past Medical History  Diagnosis Date  . Hyperlipidemia   . Hypertension   . Melanoma     leg  . Rosacea   . Psoriasis   . Vitamin D deficiency    Health Maintenance  Topic Date Due  . TETANUS/TDAP  09/30/1977  . PAP SMEAR  10/01/1979  . COLONOSCOPY  09/30/2008  . INFLUENZA VACCINE  11/15/2014  . MAMMOGRAM  11/30/2016  . Hepatitis C Screening  Completed  . HIV Screening  Completed   Immunization History  Administered Date(s) Administered  . PPD Test 12/07/2013   Past Surgical History  Procedure Laterality Date  . Skin cancer excision      leg  . Cesarean section with bilateral tubal ligation    . Cesarean section    . Cervical ablation     Family History  Problem Relation Age of Onset  . Heart disease Mother   . Hypertension Mother   . Heart disease Father   . Diabetes Father   . Hypertension Father   . Colon cancer Neg Hx    Social History  Substance Use Topics  . Smoking status: Never  Smoker   . Smokeless tobacco: Never Used  . Alcohol Use: Yes     Comment: socially,weekends at beach    ROS Constitutional: Denies fever, chills, weight loss/gain, headaches, insomnia,  night sweats, and change in appetite. Does c/o fatigue. Eyes: Denies redness, blurred vision, diplopia, discharge, itchy, watery eyes.  ENT: Denies discharge, congestion, post nasal drip, epistaxis, sore throat, earache, hearing loss, dental pain, Tinnitus, Vertigo, Sinus pain, snoring.  Cardio: Denies chest pain, palpitations, irregular heartbeat, syncope, dyspnea, diaphoresis, orthopnea, PND, claudication, edema Respiratory: denies cough, dyspnea, DOE, pleurisy, hoarseness, laryngitis, wheezing.  Gastrointestinal: Denies dysphagia, heartburn, reflux, water brash, pain, cramps, nausea, vomiting, bloating, diarrhea, constipation, hematemesis, melena, hematochezia, jaundice, hemorrhoids Genitourinary: Denies dysuria, frequency, urgency, nocturia, hesitancy, discharge, hematuria, flank pain Breast: Breast lumps, nipple discharge, bleeding.  Musculoskeletal: Denies arthralgia, myalgia, stiffness, Jt. Swelling, pain, limp, and strain/sprain. Denies falls. Skin: Denies puritis, rash, hives, warts, acne, eczema, changing in skin lesion Neuro: No weakness, tremor, incoordination, spasms, paresthesia, pain Psychiatric: Denies confusion, memory loss, sensory loss. Denies Depression. Endocrine: Denies change in weight, skin, hair change, nocturia, and paresthesia, diabetic polys, visual blurring, hyper / hypo glycemic episodes.  Heme/Lymph: No excessive bleeding, bruising, enlarged lymph nodes.  Physical Exam  BP 126/78   Pulse 72  Temp 97.3 F   Resp 16  Ht 5' 5.25"   Wt 199 lb 12.8 oz     BMI 33.01   General Appearance: Well nourished and in no apparent distress. Eyes: PERRLA, EOMs, conjunctiva no swelling or erythema, normal fundi and vessels. Sinuses: No frontal/maxillary tenderness ENT/Mouth: EACs patent  / TMs  nl. Nares clear without erythema, swelling, mucoid exudates. Oral hygiene is good. No erythema, swelling, or exudate. Tongue normal, non-obstructing. Tonsils not swollen or erythematous. Hearing normal.  Neck: Supple, thyroid normal. No bruits, nodes or JVD. Respiratory: Respiratory effort normal.  BS equal and clear bilateral without rales, rhonci, wheezing or stridor. Cardio: Heart sounds are normal with regular rate and rhythm and no murmurs, rubs or gallops. Peripheral pulses are normal and equal bilaterally without edema. No aortic or femoral bruits. Chest: symmetric with normal excursions and percussion. Breasts: Deferred to GYN Abdomen: Flat, soft, with bowel sounds. Nontender, no guarding, rebound, hernias, masses, or organomegaly.  Lymphatics: Non tender without lymphadenopathy.  Genitourinary: Deferred to GYN Musculoskeletal: Full ROM all peripheral extremities, joint stability, 5/5 strength, and normal gait. Skin: Warm and dry without rashes, lesions, cyanosis, clubbing or  ecchymosis.  Neuro: Cranial nerves intact, reflexes equal bilaterally. Normal muscle tone, no cerebellar symptoms. Sensation intact.  Pysch: Awake and oriented X 3, normal affect, Insight and Judgment appropriate.   Assessment and Plan  1. Essential hypertension  - Microalbumin / creatinine urine ratio - EKG 12-Lead - [place], RETROPERITNL ABD,  LTD - TSH  2. Mixed hyperlipidemia  - Lipid panel  3.  Abnormal glucose  - Hemoglobin A1c - Insulin, random  4. Vitamin D deficiency  - Vit D  25 hydroxy   5. Hypothyroidism  - TSH  6. pAfib (paroxysmal atrial fibrillation)   7. Screening for rectal cancer  - POC Hemoccult Bld/Stl   8. Other fatigue  - Vitamin B12 - Iron and TIBC - TSH  9. Medication management  - Urinalysis, Routine w reflex microscopic  - CBC with Differential/Platelet - BASIC METABOLIC PANEL WITH GFR - Hepatic function panel - Magnesium   Continue prudent diet as  discussed, weight control, BP monitoring, regular exercise, and medications. Discussed med's effects and SE's. Screening labs and tests as requested with regular follow-up as recommended.  Over 40 minutes of exam, counseling, chart review was performed.

## 2014-12-22 NOTE — Patient Instructions (Signed)

## 2014-12-23 LAB — VITAMIN D 25 HYDROXY (VIT D DEFICIENCY, FRACTURES): Vit D, 25-Hydroxy: 62 ng/mL (ref 30–100)

## 2014-12-23 LAB — CBC WITH DIFFERENTIAL/PLATELET
Basophils Absolute: 0.1 10*3/uL (ref 0.0–0.1)
Basophils Relative: 1 % (ref 0–1)
Eosinophils Absolute: 0.2 10*3/uL (ref 0.0–0.7)
Eosinophils Relative: 2 % (ref 0–5)
HCT: 40.9 % (ref 36.0–46.0)
Hemoglobin: 13.4 g/dL (ref 12.0–15.0)
Lymphocytes Relative: 40 % (ref 12–46)
Lymphs Abs: 4.2 10*3/uL — ABNORMAL HIGH (ref 0.7–4.0)
MCH: 29.4 pg (ref 26.0–34.0)
MCHC: 32.8 g/dL (ref 30.0–36.0)
MCV: 89.7 fL (ref 78.0–100.0)
MPV: 10.4 fL (ref 8.6–12.4)
Monocytes Absolute: 0.7 10*3/uL (ref 0.1–1.0)
Monocytes Relative: 7 % (ref 3–12)
Neutro Abs: 5.2 10*3/uL (ref 1.7–7.7)
Neutrophils Relative %: 50 % (ref 43–77)
Platelets: 301 10*3/uL (ref 150–400)
RBC: 4.56 MIL/uL (ref 3.87–5.11)
RDW: 13.6 % (ref 11.5–15.5)
WBC: 10.4 10*3/uL (ref 4.0–10.5)

## 2014-12-23 LAB — URINALYSIS, ROUTINE W REFLEX MICROSCOPIC
Bilirubin Urine: NEGATIVE
Glucose, UA: NEGATIVE
Hgb urine dipstick: NEGATIVE
Ketones, ur: NEGATIVE
Leukocytes, UA: NEGATIVE
Nitrite: NEGATIVE
Protein, ur: NEGATIVE
Specific Gravity, Urine: 1.008 (ref 1.001–1.035)
pH: 6 (ref 5.0–8.0)

## 2014-12-23 LAB — HEMOGLOBIN A1C
Hgb A1c MFr Bld: 5.6 % (ref <5.7)
Mean Plasma Glucose: 114 mg/dL (ref <117)

## 2014-12-23 LAB — VITAMIN B12: Vitamin B-12: 816 pg/mL (ref 211–911)

## 2014-12-23 LAB — MICROALBUMIN / CREATININE URINE RATIO
Creatinine, Urine: 39 mg/dL
Microalb Creat Ratio: 10.3 mg/g (ref 0.0–30.0)
Microalb, Ur: 0.4 mg/dL (ref <2.0)

## 2014-12-23 LAB — VARICELLA ZOSTER ANTIBODY, IGG: Varicella IgG: 1034 Index — ABNORMAL HIGH (ref <135.00)

## 2014-12-23 LAB — TSH: TSH: 1.54 u[IU]/mL (ref 0.350–4.500)

## 2014-12-23 LAB — INSULIN, RANDOM: Insulin: 9.4 u[IU]/mL (ref 2.0–19.6)

## 2014-12-25 ENCOUNTER — Encounter: Payer: Self-pay | Admitting: Internal Medicine

## 2014-12-25 DIAGNOSIS — Z6838 Body mass index (BMI) 38.0-38.9, adult: Secondary | ICD-10-CM

## 2015-01-31 ENCOUNTER — Other Ambulatory Visit: Payer: Self-pay | Admitting: Internal Medicine

## 2015-02-01 ENCOUNTER — Other Ambulatory Visit: Payer: Self-pay | Admitting: Physician Assistant

## 2015-02-13 ENCOUNTER — Other Ambulatory Visit: Payer: Self-pay | Admitting: Internal Medicine

## 2015-03-04 ENCOUNTER — Other Ambulatory Visit: Payer: Self-pay | Admitting: Internal Medicine

## 2015-03-06 ENCOUNTER — Encounter: Payer: Self-pay | Admitting: Physician Assistant

## 2015-03-06 NOTE — Progress Notes (Signed)
Cardiology Office Note Date:  03/07/2015  Patient ID:  Monica Morgan, Monica Morgan 26-Jan-1961, MRN [id number] PCP:  Alesia Richards, MD  Cardiologist: Dr. Johnsie Cancel  Chief Complaint: f/u AF  History of Present Illness: Monica Morgan is a 54 y.o. female with history of PAF, HLD, psoriasis, hypothyroidism who presents for f/u. She relocated from [place] a few years ago. Per Dr. [name] notes, she has no history of structural heart disease and has never required DCCV. ETT in 2013 was normal. She was previously on Rhythmol but gained a significant amount of weight and had no relief of AF, so was changed to Multaq. The patient eventually titrated the dose down herself. She found that a combination of 60lb weight loss + Multaq PRN helped to significantly reduce her afib burden. She has not been on anticoagulation in the past due to low CHADS score (prior to use of CHADSVASC). No recent echo available. CBC/BMET/Mg/TSH/A1C/LFTs in 12/2014 were normal. PCP following lipids. She denies h/o HTN, CVA, TIA or known vascular disease.  She comes in for follow-up overall doing well - however, she has gained about 10lbs since September and is starting to notice more frequency of afib. It now occurs about once per week, sometimes lasting up to 2 days at a time. It is associated with fatigue. She has not had any chest pain, dyspnea or functional limitation. She still only takes 1/2 tablet Multaq daily PRN.    Past Medical History  Diagnosis Date  . Hyperlipidemia   . Melanoma ([place])     leg  . Rosacea   . Psoriasis   . Vitamin D deficiency   . PAF (paroxysmal atrial fibrillation) ([place])   . Hypothyroidism     Past Surgical History  Procedure Laterality Date  . Skin cancer excision      leg  . Cesarean section with bilateral tubal ligation    . Cesarean section    . Cervical ablation      Current Outpatient Prescriptions  Medication Sig Dispense Refill  . aspirin 81 MG tablet Take 81 mg by mouth  daily.    Marland Kitchen augmented betamethasone dipropionate (DIPROLENE-AF) 0.05 % cream APPLY TWICE DAILY FOR PSORIASIS. DO NOT APPLY TO FACE 50 g 1  . betamethasone dipropionate (DIPROLENE) 0.05 % cream Apply 1 application topically as needed.    . Calcium Carbonate-Vitamin D (CALCIUM 600+D HIGH POTENCY PO) Take 1 tablet by mouth daily.    . Cholecalciferol (VITAMIN D PO) Take 5,000 Units by mouth daily.    . Coenzyme Q10 (CO Q10) 100 MG CAPS Take 1 capsule by mouth daily.    . cyclobenzaprine (FLEXERIL) 10 MG tablet Take 1 tablet (10 mg total) by mouth every 8 (eight) hours as needed for muscle spasms. 30 tablet 1  . dronedarone (MULTAQ) 400 MG tablet TAKE 0.5 TABLETS (200 MG TOTAL) BY MOUTH DAILY. 45 tablet 3  . ibuprofen (ADVIL,MOTRIN) 800 MG tablet Take 1 tablet (800 mg total) by mouth every 6 (six) hours as needed. 60 tablet 2  . levothyroxine (SYNTHROID, LEVOTHROID) 50 MCG tablet TAKE 1 TABLET (50 MCG TOTAL) BY MOUTH DAILY. 90 tablet 1  . Magnesium 250 MG TABS Take 2 tablets by mouth daily.    . montelukast (SINGULAIR) 10 MG tablet TAKE 1 TABLET BY MOUTH EVERY DAY 90 tablet 1  . NONFORMULARY OR COMPOUNDED ITEM 3 (three) times a week. Estriol/testosterone 0.25-0.25 suppository compound    . NONFORMULARY OR COMPOUNDED ITEM Place onto the skin daily. Biest 8/2 HRT compound    .  OVER THE COUNTER MEDICATION Multivitamin and diet supplement    . OVER THE COUNTER MEDICATION Tumeric 2 capsules daily    . Probiotic Product (PROBIOTIC DAILY PO) Take 1 tablet by mouth daily.    Marland Kitchen PROGESTERONE, VAGINAL, 4 % GEL Place onto the skin. Days 1-25 of each month    . Turmeric 500 MG CAPS Take 1 tablet 2 x day     No current facility-administered medications for this visit.    Allergies:   Lipitor; Rythmol; and Sulfa drugs cross reactors   Social History:  The patient  reports that she has never smoked. She has never used smokeless tobacco. She reports that she drinks alcohol. She reports that she does not use  illicit drugs.   Family History:  The patient's family history includes Diabetes in her father; Heart attack in her brother and mother; Heart disease in her father and mother; Hypertension in her father and mother. There is no history of Colon cancer or Stroke.  ROS:  Please see the history of present illness.  All other systems are reviewed and otherwise negative.   PHYSICAL EXAM:  VS:  BP 138/70 mmHg  Pulse 67  Ht 5' 5.25" (1.657 m)  Wt 210 lb (95.255 kg)  BMI 34.69 kg/m2 BMI: Body mass index is 34.69 kg/(m^2). Well nourished, well developed WF, in no acute distress HEENT: normocephalic, atraumatic Neck: no JVD, carotid bruits or masses Cardiac:  normal S1, S2; RRR; very soft SEM. No rubs or gallops Lungs:  clear to auscultation bilaterally, no wheezing, rhonchi or rales Abd: soft, nontender, no hepatomegaly, + BS MS: no deformity or atrophy Ext: no edema Skin: warm and dry, no rash Neuro:  moves all extremities spontaneously, no focal abnormalities noted, follows commands Psych: euthymic mood, full affect   EKG:  Done today shows NSR 76bpm, nonspecific ST-T changes, QTc 441ms  Recent Labs: 12/22/2014: ALT 15; BUN 18; Creat 0.71; Hemoglobin 13.4; Magnesium 2.3; Platelets 301; Potassium 4.2; Sodium 138; TSH 1.540  12/22/2014: Cholesterol 207*; HDL 38*; LDL Cholesterol 127; Total CHOL/HDL Ratio 5.4*; Triglycerides 211*; VLDL 42*   CrCl cannot be calculated (Patient has no serum creatinine result on file.).   Wt Readings from Last 3 Encounters:  03/07/15 210 lb (95.255 kg)  12/22/14 199 lb 12.8 oz (90.629 kg)  10/14/14 198 lb (89.812 kg)     Other studies reviewed: Additional studies/records reviewed today include: summarized abov   ASSESSMENT AND PLAN:  1. PAF - she's noticed increased frequency of symptoms since gaining weight (see below). She is only taking Multaq PRN. I advised to start Multaq regularly at therapeutic dose (400mg  BID) - she prefers to self-titrate this  medication upwards starting with lower dose, i.e. 200mg  BID. If this is ineffective, she will make her way up to 400mg  BID. If this does not control her atrial fib, she will let us know so we can change strategies. Will update her echocardiogram to make sure LV function remains normal as Multaq is prohibited in patients with structural heart failure. At this time she does not require anticoagulation as CHADSVASC is only 1 (she confirms she does not have hypertension) - we discussed that this may change if she develops new risk factors or when she turns 63.  2. Hyperlipidemia - followed by PCP. 3. Hypothyroidism - TSH normal by labs 12/2014. 4. Obesity - we discussed relationship of obesity + AF. She plans to start focusing on her weight loss after the holidays - she does not think  she would be successful if she starts now. She thinks she occasionally snores but does not think it is a significant issues. She declines sleep study at this time.  Disposition: F/u with Dr. Johnsie Cancel in 6 months.  Current medicines are reviewed at length with the patient today.  The patient did not have any concerns regarding medicines.  Raechel Ache PA-C 03/07/2015 8:59 AM     [facility] [address] [place] [address] [phone] (office)  [phone] (fax)

## 2015-03-07 ENCOUNTER — Ambulatory Visit (INDEPENDENT_AMBULATORY_CARE_PROVIDER_SITE_OTHER): Payer: 59 | Admitting: Physician Assistant

## 2015-03-07 ENCOUNTER — Encounter: Payer: Self-pay | Admitting: Physician Assistant

## 2015-03-07 VITALS — BP 138/70 | HR 67 | Ht 65.25 in | Wt 210.0 lb

## 2015-03-07 DIAGNOSIS — E782 Mixed hyperlipidemia: Secondary | ICD-10-CM

## 2015-03-07 DIAGNOSIS — E669 Obesity, unspecified: Secondary | ICD-10-CM | POA: Diagnosis not present

## 2015-03-07 DIAGNOSIS — E039 Hypothyroidism, unspecified: Secondary | ICD-10-CM

## 2015-03-07 DIAGNOSIS — I480 Paroxysmal atrial fibrillation: Secondary | ICD-10-CM

## 2015-03-07 MED ORDER — DRONEDARONE HCL 400 MG PO TABS: 400.0000 mg | ORAL_TABLET | Freq: Two times a day (BID) | ORAL | Status: DC

## 2015-03-07 NOTE — Patient Instructions (Addendum)
Medication Instructions:  Your physician has recommended you make the following change in your medication:  1.  INCREASE the Multaq 400 mg taking 1 tablet twice a day  Labwork: None ordered  Testing/Procedures: Your physician has requested that you have an echocardiogram. Echocardiography is a painless test that uses sound waves to create images of your heart. It provides your doctor with information about the size and shape of your heart and how well your heart's chambers and valves are working. This procedure takes approximately one hour. There are no restrictions for this procedure.    Follow-Up: Your physician wants you to follow-up in:  Monica Morgan.  You will receive a reminder letter in the mail two months in advance. If you don't receive a letter, please call our office to schedule the follow-up appointment.   Any Other Special Instructions Will Be Listed Below (If Applicable).  Echocardiogram An echocardiogram, or echocardiography, uses sound waves (ultrasound) to produce an image of your heart. The echocardiogram is simple, painless, obtained within a short period of time, and offers valuable information to your health care provider. The images from an echocardiogram can provide information such as:  Evidence of coronary artery disease (CAD).  Heart size.  Heart muscle function.  Heart valve function.  Aneurysm detection.  Evidence of a past heart attack.  Fluid buildup around the heart.  Heart muscle thickening.  Assess heart valve function. LET [facility] CARE PROVIDER KNOW ABOUT:  Any allergies you have.  All medicines you are taking, including vitamins, herbs, eye drops, creams, and over-the-counter medicines.  Previous problems you or members of your family have had with the use of anesthetics.  Any blood disorders you have.  Previous surgeries you have had.  Medical conditions you have.  Possibility of pregnancy, if this  applies. BEFORE THE PROCEDURE  No special preparation is needed. Eat and drink normally.  PROCEDURE   In order to produce an image of your heart, gel will be applied to your chest and a wand-like tool (transducer) will be moved over your chest. The gel will help transmit the sound waves from the transducer. The sound waves will harmlessly bounce off your heart to allow the heart images to be captured in real-time motion. These images will then be recorded.  You may need an IV to receive a medicine that improves the quality of the pictures. AFTER THE PROCEDURE You may return to your normal schedule including diet, activities, and medicines, unless your health care provider tells you otherwise.   This information is not intended to replace advice given to you by your health care provider. Make sure you discuss any questions you have with your health care provider.   Document Released: 03/30/2000 Document Revised: 04/23/2014 Document Reviewed: 12/08/2012 Elsevier Interactive Patient Education [employer].    If you need a refill on your cardiac medications before your next appointment, please call your pharmacy.

## 2015-03-16 ENCOUNTER — Ambulatory Visit (HOSPITAL_COMMUNITY): Payer: 59 | Attending: Cardiovascular Disease

## 2015-03-16 ENCOUNTER — Other Ambulatory Visit: Payer: Self-pay | Admitting: *Deleted

## 2015-03-16 ENCOUNTER — Other Ambulatory Visit: Payer: Self-pay

## 2015-03-16 DIAGNOSIS — E785 Hyperlipidemia, unspecified: Secondary | ICD-10-CM | POA: Insufficient documentation

## 2015-03-16 DIAGNOSIS — I340 Nonrheumatic mitral (valve) insufficiency: Secondary | ICD-10-CM | POA: Diagnosis not present

## 2015-03-16 DIAGNOSIS — I480 Paroxysmal atrial fibrillation: Secondary | ICD-10-CM

## 2015-03-16 DIAGNOSIS — I517 Cardiomegaly: Secondary | ICD-10-CM | POA: Diagnosis not present

## 2015-03-16 MED ORDER — DRONEDARONE HCL 400 MG PO TABS: 400.0000 mg | ORAL_TABLET | Freq: Two times a day (BID) | ORAL | Status: DC

## 2015-03-17 ENCOUNTER — Telehealth: Payer: Self-pay | Admitting: Cardiovascular Disease

## 2015-03-17 NOTE — Telephone Encounter (Signed)
New message ° ° ° ° ° °Returning a nurses call to get echo results °

## 2015-03-17 NOTE — Telephone Encounter (Signed)
PT AWARE OF ECHO RESULTS./CY 

## 2015-06-23 ENCOUNTER — Encounter: Payer: Self-pay | Admitting: Internal Medicine

## 2015-06-23 ENCOUNTER — Ambulatory Visit (INDEPENDENT_AMBULATORY_CARE_PROVIDER_SITE_OTHER): Payer: 59 | Admitting: Internal Medicine

## 2015-06-23 VITALS — BP 136/80 | HR 66 | Temp 98.2°F | Resp 18 | Ht 65.25 in | Wt 202.0 lb

## 2015-06-23 DIAGNOSIS — Z79899 Other long term (current) drug therapy: Secondary | ICD-10-CM | POA: Diagnosis not present

## 2015-06-23 DIAGNOSIS — E785 Hyperlipidemia, unspecified: Secondary | ICD-10-CM | POA: Diagnosis not present

## 2015-06-23 DIAGNOSIS — Z23 Encounter for immunization: Secondary | ICD-10-CM

## 2015-06-23 LAB — CBC WITH DIFFERENTIAL/PLATELET
Basophils Absolute: 0 10*3/uL (ref 0.0–0.1)
Basophils Relative: 0 % (ref 0–1)
Eosinophils Absolute: 0.2 10*3/uL (ref 0.0–0.7)
Eosinophils Relative: 2 % (ref 0–5)
HCT: 40.3 % (ref 36.0–46.0)
Hemoglobin: 13.2 g/dL (ref 12.0–15.0)
Lymphocytes Relative: 38 % (ref 12–46)
Lymphs Abs: 3.1 10*3/uL (ref 0.7–4.0)
MCH: 28.7 pg (ref 26.0–34.0)
MCHC: 32.8 g/dL (ref 30.0–36.0)
MCV: 87.6 fL (ref 78.0–100.0)
MPV: 9.8 fL (ref 8.6–12.4)
Monocytes Absolute: 0.6 10*3/uL (ref 0.1–1.0)
Monocytes Relative: 7 % (ref 3–12)
Neutro Abs: 4.3 10*3/uL (ref 1.7–7.7)
Neutrophils Relative %: 53 % (ref 43–77)
Platelets: 296 10*3/uL (ref 150–400)
RBC: 4.6 MIL/uL (ref 3.87–5.11)
RDW: 13.9 % (ref 11.5–15.5)
WBC: 8.1 10*3/uL (ref 4.0–10.5)

## 2015-06-23 LAB — BASIC METABOLIC PANEL WITH GFR
BUN: 14 mg/dL (ref 7–25)
CO2: 29 mmol/L (ref 20–31)
Calcium: 9.6 mg/dL (ref 8.6–10.4)
Chloride: 103 mmol/L (ref 98–110)
Creat: 0.81 mg/dL (ref 0.50–1.05)
GFR, Est African American: >89 mL/min (ref >=60)
GFR, Est Non African American: 81 mL/min (ref >=60)
Glucose, Bld: 93 mg/dL (ref 65–99)
Potassium: 4.5 mmol/L (ref 3.5–5.3)
Sodium: 139 mmol/L (ref 135–146)

## 2015-06-23 LAB — LIPID PANEL
Cholesterol: 195 mg/dL (ref 125–200)
HDL: 46 mg/dL (ref >=46)
LDL Cholesterol: 126 mg/dL (ref <130)
Total CHOL/HDL Ratio: 4.2 Ratio (ref <=5.0)
Triglycerides: 113 mg/dL (ref <150)
VLDL: 23 mg/dL (ref <30)

## 2015-06-23 LAB — HEPATIC FUNCTION PANEL
ALT: 11 U/L (ref 6–29)
AST: 14 U/L (ref 10–35)
Albumin: 4.6 g/dL (ref 3.6–5.1)
Alkaline Phosphatase: 51 U/L (ref 33–130)
Bilirubin, Direct: 0.1 mg/dL (ref <=0.2)
Indirect Bilirubin: 0.6 mg/dL (ref 0.2–1.2)
Total Bilirubin: 0.7 mg/dL (ref 0.2–1.2)
Total Protein: 7.1 g/dL (ref 6.1–8.1)

## 2015-06-23 MED ORDER — BETAMETHASONE DIPROPIONATE AUG 0.05 % EX CREA: TOPICAL_CREAM | CUTANEOUS | Status: DC

## 2015-06-23 NOTE — Progress Notes (Signed)
Assessment and Plan:  Hypertension:  -Continue medication,  -monitor blood pressure at home.  -Continue DASH diet.   -Reminder to go to the ER if any CP, SOB, nausea, dizziness, severe HA, changes vision/speech, left arm numbness and tingling, and jaw pain.  Cholesterol: -Continue diet and exercise.  -Check cholesterol.   Pre-diabetes: -recommended more calories per day than 500-800 -Continue diet and exercise.  -Check A1C  Vitamin D Def: -check level -continue medications.   TDAP -updated today  Continue diet and meds as discussed. Further disposition pending results of labs.  HPI 55 y.o. female  presents for 3 month follow up with hypertension, hyperlipidemia, prediabetes and vitamin D.   Her blood pressure has been controlled at home, today their BP is BP: 136/80 mmHg.   She does workout. She denies chest pain, shortness of breath, dizziness.  She reports that she is eating about 500-800 calories per day on her nutrizone diet.     She is not on cholesterol medication and denies myalgias. Her cholesterol is not at goal. The cholesterol last visit was:   Lab Results  Component Value Date   CHOL 207* 12/22/2014   HDL 38* 12/22/2014   LDLCALC 127 12/22/2014   TRIG 211* 12/22/2014   CHOLHDL 5.4* 12/22/2014     She has been working on diet and exercise for prediabetes, and denies foot ulcerations, hyperglycemia, hypoglycemia , increased appetite, nausea, paresthesia of the feet, polydipsia, polyuria, visual disturbances, vomiting and weight loss. Last A1C in the office was:  Lab Results  Component Value Date   HGBA1C 5.6 12/22/2014    Patient is on Vitamin D supplement.  Lab Results  Component Value Date   VD25OH 62 12/22/2014      Current Medications:  Current Outpatient Prescriptions on File Prior to Visit  Medication Sig Dispense Refill  . aspirin 81 MG tablet Take 81 mg by mouth daily.    Marland Kitchen augmented betamethasone dipropionate (DIPROLENE-AF) 0.05 % cream  APPLY TWICE DAILY FOR PSORIASIS. DO NOT APPLY TO FACE 50 g 1  . betamethasone dipropionate (DIPROLENE) 0.05 % cream Apply 1 application topically as needed.    . Calcium 600-400 MG-UNIT CHEW Chew by mouth.    . Cholecalciferol (VITAMIN D PO) Take 5,000 Units by mouth daily.    . Coenzyme Q10 (CO Q10) 100 MG CAPS Take 1 capsule by mouth daily.    . cyclobenzaprine (FLEXERIL) 10 MG tablet Take 1 tablet (10 mg total) by mouth every 8 (eight) hours as needed for muscle spasms. 30 tablet 1  . dronedarone (MULTAQ) 400 MG tablet Take 1 tablet (400 mg total) by mouth 2 (two) times daily with a meal. 180 tablet 1  . ibuprofen (ADVIL,MOTRIN) 800 MG tablet Take 1 tablet (800 mg total) by mouth every 6 (six) hours as needed. 60 tablet 2  . levothyroxine (SYNTHROID, LEVOTHROID) 50 MCG tablet TAKE 1 TABLET (50 MCG TOTAL) BY MOUTH DAILY. 90 tablet 1  . Magnesium 250 MG TABS Take 2 tablets by mouth daily.    . montelukast (SINGULAIR) 10 MG tablet TAKE 1 TABLET BY MOUTH EVERY DAY 90 tablet 1  . NONFORMULARY OR COMPOUNDED ITEM 3 (three) times a week. Estriol/testosterone 0.25-0.25 suppository compound    . NONFORMULARY OR COMPOUNDED ITEM Place onto the skin daily. Biest 8/2 HRT compound    . OVER THE COUNTER MEDICATION Multivitamin and diet supplement    . OVER THE COUNTER MEDICATION Tumeric 2 capsules daily    . Probiotic Product (PROBIOTIC DAILY  PO) Take 1 tablet by mouth daily.    Marland Kitchen PROGESTERONE, VAGINAL, 4 % GEL Place onto the skin. Days 1-25 of each month    . Turmeric 500 MG CAPS Take 1 tablet 2 x day     No current facility-administered medications on file prior to visit.    Medical History:  Past Medical History  Diagnosis Date  . Hyperlipidemia   . Melanoma ([place])     leg  . Rosacea   . Psoriasis   . Vitamin D deficiency   . Hypothyroidism     Allergies:  Allergies  Allergen Reactions  . Lipitor [Atorvastatin] Other (See Comments)    "neck pain"  . Rythmol [Propafenone] Other (See  Comments)    "weight gain"  . Sulfa Drugs Cross Reactors Other (See Comments)    "soreness all over"     Review of Systems:  Review of Systems  Constitutional: Negative for fever, chills and malaise/fatigue.  HENT: Negative for congestion, ear pain and sore throat.   Eyes: Negative.   Respiratory: Negative for cough, shortness of breath and wheezing.   Cardiovascular: Negative for chest pain, palpitations and leg swelling.  Gastrointestinal: Negative for heartburn, diarrhea, constipation, blood in stool and melena.  Genitourinary: Negative.   Skin: Negative.   Neurological: Negative for dizziness, loss of consciousness and headaches.  Psychiatric/Behavioral: Negative for depression. The patient is not nervous/anxious and does not have insomnia.     Family history- Review and unchanged  Social history- Review and unchanged  Physical Exam: BP 136/80 mmHg  Pulse 66  Temp(Src) 98.2 F (36.8 C) (Temporal)  Resp 18  Ht 5' 5.25" (1.657 m)  Wt 202 lb (91.627 kg)  BMI 33.37 kg/m2 Wt Readings from Last 3 Encounters:  06/23/15 202 lb (91.627 kg)  03/07/15 210 lb (95.255 kg)  12/22/14 199 lb 12.8 oz (90.629 kg)    General Appearance: Well nourished well developed, in no apparent distress. Eyes: PERRLA, EOMs, conjunctiva no swelling or erythema ENT/Mouth: Ear canals normal without obstruction, swelling, erythma, discharge.  TMs normal bilaterally.  Oropharynx moist, clear, without exudate, or postoropharyngeal swelling. Neck: Supple, thyroid normal,no cervical adenopathy  Respiratory: Respiratory effort normal, Breath sounds clear A&P without rhonchi, wheeze, or rale.  No retractions, no accessory usage. Cardio: RRR with no MRGs. Brisk peripheral pulses without edema.  Abdomen: Soft, + BS,  Non tender, no guarding, rebound, hernias, masses. Musculoskeletal: Full ROM, 5/5 strength, Normal gait Skin: Warm, dry without rashes, lesions, ecchymosis.  Neuro: Awake and oriented X 3,  Cranial nerves intact. Normal muscle tone, no cerebellar symptoms. Psych: Normal affect, Insight and Judgment appropriate.    Starlyn Skeans, PA-C 9:01 AM [facility]

## 2015-06-23 NOTE — Addendum Note (Signed)
Addended by: Isabellah Sobocinski A on: 06/23/2015 09:35 AM   Modules accepted: Orders

## 2015-07-23 ENCOUNTER — Other Ambulatory Visit: Payer: Self-pay | Admitting: Internal Medicine

## 2015-07-25 ENCOUNTER — Other Ambulatory Visit: Payer: Self-pay | Admitting: Internal Medicine

## 2015-09-07 ENCOUNTER — Ambulatory Visit: Payer: 59 | Admitting: Cardiovascular Disease

## 2015-09-27 ENCOUNTER — Ambulatory Visit: Payer: Self-pay | Admitting: Internal Medicine

## 2015-10-15 DIAGNOSIS — I4892 Unspecified atrial flutter: Secondary | ICD-10-CM

## 2015-10-15 HISTORY — DX: Unspecified atrial flutter: I48.92

## 2015-10-19 NOTE — Progress Notes (Signed)
Cardiology Office Note    Date:  10/20/2015   ID:  Monica Morgan, DOB Aug 17, 1960, MRN [id number]  PCP:  Alesia Richards, MD  Cardiologist:  Dr. Johnsie Cancel   CC: Aflutter and SOB.  History of Present Illness:  Monica Morgan is a 55 y.o. female with a history of PAF on Multaq, HLD, psoriasis, hypothyroidism who presents for evaluation of worsening PAF and SOB.   She relocated from [place] a few years ago. Per Dr. [name] notes, she has no history of structural heart disease and has never required DCCV. ETT in 2013 was normal. She was previously on Rhythmol but gained a significant amount of weight and had no relief of AF, so was changed to Multaq. The patient eventually titrated the dose down herself. She found that a combination of 60lb weight loss + Multaq PRN helped to significantly reduce her afib burden. She has not been on anticoagulation in the past due to low CHADS score (prior to use of CHADSVASC). No recent echo available. CBC/BMET/Mg/TSH/A1C/LFTs in 12/2014 were normal. PCP following lipids. She denies h/o HTN, CVA, TIA or known vascular disease.  She was seen by Melina Copa in 02/2015 for follow up. She noticed worsening aifb after a 10 lb weight and was taking 1/2 tablet Multaq daily PRN. 2D ECHO was ordered which showed normal LV function, severe LAE, mild MR. It was suggested that she take her Multaq as prescribed regularly at therapeutic dose (400mg  BID) vs PRN.   Today she presents to clinic for worsening PAF and SOB. She had what she thinks was a bronchitis with wheezing and coughing for 1 week. She is in the middle of a move and she thinks the dust has made everything worse. She was recently placed on steroids and cough syrup. She has been feeling terrible with fatigue and weakness all week. She thinks she has been out of rhythm for at least 1 week. She has not had any LE edema or PND, but was sleeping in a recliner for a couple days this week. No chest pain. No dizziness  or syncope. No blood in her stool or urine. She has been taking her Mutaq as prescribed 400mg  BID for at least the last 2 months.     Past Medical History  Diagnosis Date  . Hyperlipidemia   . Melanoma ([place])     leg  . Rosacea   . Psoriasis   . Vitamin D deficiency   . Hypothyroidism     Past Surgical History  Procedure Laterality Date  . Skin cancer excision      leg  . Cesarean section with bilateral tubal ligation    . Cesarean section    . Cervical ablation      Current Medications: Outpatient Prescriptions Prior to Visit  Medication Sig Dispense Refill  . aspirin 81 MG tablet Take 81 mg by mouth daily.    Marland Kitchen augmented betamethasone dipropionate (DIPROLENE-AF) 0.05 % cream APPLY TWICE DAILY FOR PSORIASIS. DO NOT APPLY TO FACE 50 g 1  . betamethasone dipropionate (DIPROLENE) 0.05 % cream Apply 1 application topically as needed.    . Calcium 600-400 MG-UNIT CHEW Chew by mouth.    . Cholecalciferol (VITAMIN D PO) Take 5,000 Units by mouth daily.    . Coenzyme Q10 (CO Q10) 100 MG CAPS Take 1 capsule by mouth daily.    Marland Kitchen dronedarone (MULTAQ) 400 MG tablet Take 1 tablet (400 mg total) by mouth 2 (two) times daily with a meal. 180 tablet 1  .  ibuprofen (ADVIL,MOTRIN) 800 MG tablet Take 1 tablet (800 mg total) by mouth every 6 (six) hours as needed. 60 tablet 2  . levothyroxine (SYNTHROID, LEVOTHROID) 50 MCG tablet TAKE 1 TABLET (50 MCG TOTAL) BY MOUTH DAILY. 90 tablet 1  . Magnesium 250 MG TABS Take 2 tablets by mouth daily.    . montelukast (SINGULAIR) 10 MG tablet TAKE 1 TABLET BY MOUTH EVERY DAY 90 tablet 1  . NONFORMULARY OR COMPOUNDED ITEM 3 (three) times a week. Estriol/testosterone 0.25-0.25 suppository compound    . NONFORMULARY OR COMPOUNDED ITEM Place onto the skin daily. Biest 8/2 HRT compound    . OVER THE COUNTER MEDICATION Multivitamin and diet supplement    . Probiotic Product (PROBIOTIC DAILY PO) Take 1 tablet by mouth daily.    Marland Kitchen PROGESTERONE, VAGINAL, 4 % GEL  Place onto the skin. Days 1-25 of each month    . Turmeric 500 MG CAPS Take 1 tablet 2 x day    . cyclobenzaprine (FLEXERIL) 10 MG tablet Take 1 tablet (10 mg total) by mouth every 8 (eight) hours as needed for muscle spasms. 30 tablet 1  . OVER THE COUNTER MEDICATION Tumeric 2 capsules daily     No facility-administered medications prior to visit.     Allergies:   Lipitor; Rythmol; and Sulfa drugs cross reactors   Social History   Social History  . Marital Status: Divorced    Spouse Name: N/A  . Number of Children: N/A  . Years of Education: N/A   Social History Main Topics  . Smoking status: Never Smoker   . Smokeless tobacco: Never Used  . Alcohol Use: Yes     Comment: socially,weekends at beach  . Drug Use: No  . Sexual Activity: Not Asked   Other Topics Concern  . None   Social History Narrative     Family History:  The patient's family history includes Diabetes in her father; Heart attack in her brother and mother; Heart disease in her father and mother; Hypertension in her father and mother. There is no history of Colon cancer or Stroke.     ROS:   Please see the history of present illness.    ROS All other systems reviewed and are negative.   PHYSICAL EXAM:   VS:  BP 128/82 mmHg  Pulse 150  Ht 5' 5.25" (1.657 m)  Wt 212 lb 12.8 oz (96.525 kg)  BMI 35.16 kg/m2   GEN: Well nourished, well developed, in no acute distress HEENT: normal Neck: no JVD, carotid bruits, or masses Cardiac: irreg irreg tachy; no murmurs, rubs, or gallops,no edema  Respiratory:  clear to auscultation bilaterally, normal work of breathing GI: soft, nontender, nondistended, + BS MS: no deformity or atrophy Skin: warm and dry, no rash Neuro:  Alert and Oriented x 3, Strength and sensation are intact Psych: euthymic mood, full affect  Wt Readings from Last 3 Encounters:  10/20/15 212 lb 12.8 oz (96.525 kg)  06/23/15 202 lb (91.627 kg)  03/07/15 210 lb (95.255 kg)       Studies/Labs Reviewed:   EKG:  EKG is ordered today.  The ekg ordered today demonstrates atrial flutter with RVR HR 151 2:1 AV conduction  Recent Labs: 12/22/2014: Magnesium 2.3; TSH 1.540 06/23/2015: ALT 11; BUN 14; Creat 0.81; Hemoglobin 13.2; Platelets 296; Potassium 4.5; Sodium 139   Lipid Panel    Component Value Date/Time   CHOL 195 06/23/2015 0921   TRIG 113 06/23/2015 0921   HDL 46 06/23/2015  0921   CHOLHDL 4.2 06/23/2015 0921   VLDL 23 06/23/2015 0921   LDLCALC 126 06/23/2015 0921    Additional studies/ records that were reviewed today include:  2D ECHO: 03/16/2015 LV EF: 55% - 60% Study Conclusions - Left ventricle: The cavity size was normal. Systolic function was normal. The estimated ejection fraction was in the range of 55% to 60%. Wall motion was normal; there were no regional wall motion abnormalities. - Mitral valve: There was mild regurgitation. - Left atrium: The atrium was severely dilated. - Atrial septum: No defect or patent foramen ovale was identified.    ASSESSMENT & PLAN:   Aflutter with RVR-  2:1 conduction. HR 151. She has been on Multaq 400mg  BID x 2 months (beofre she was taking PRN).  She has not been on anticoagulation as CHADSVASC is only 1 (she confirms she does not have hypertension). She has very symptomatic with SOB and may have early CHF. Plan will be to admit her and start her on Eliquis 5mg  BID. SHe was given one dose of Eliquis 5mg  this AM in the office. Will start IV dilt when she gets to hospital for rate control and hold her Multaq. Plan will be for TEE/DCCV if she does not convert after at least 5 doses of Eliquis. I will also give her one dose of IV lasix 20mg  when she arrives at the hospital.    HLD- followed by PCP. Recent lipid panel with LDL 126.  Hypothyroidism - TSH normal by labs 12/2014. Will recheck when admitted.   Obesity - she has gained more weight recently and knows this contributes to her afib burden.      Medication Adjustments/Labs and Tests Ordered: Current medicines are reviewed at length with the patient today.  Concerns regarding medicines are outlined above.  Medication changes, Labs and Tests ordered today are listed in the Patient Instructions below. Patient Instructions  You are being direct admitted into [facility]. Please go to the [facility] (where they offer valet parking) Go through the double doors and go to admitting up on the left.  You will be going to [employer], Angelena Form, PA-C  10/20/2015 8:36 AM    [address], [place],   [address] Phone: [phone]; Fax: [phone]

## 2015-10-20 ENCOUNTER — Encounter: Payer: Self-pay | Admitting: Physician Assistant

## 2015-10-20 ENCOUNTER — Ambulatory Visit (INDEPENDENT_AMBULATORY_CARE_PROVIDER_SITE_OTHER): Payer: Self-pay | Admitting: Physician Assistant

## 2015-10-20 ENCOUNTER — Observation Stay (HOSPITAL_BASED_OUTPATIENT_CLINIC_OR_DEPARTMENT_OTHER)
Admission: AD | Admit: 2015-10-20 | Discharge: 2015-10-22 | Disposition: A | Discharge status: Home / Self Care | Payer: 59 | Source: Ambulatory Visit | Attending: Cardiology | Admitting: Cardiology

## 2015-10-20 VITALS — BP 128/82 | HR 150 | Ht 65.25 in | Wt 212.8 lb

## 2015-10-20 DIAGNOSIS — L719 Rosacea, unspecified: Secondary | ICD-10-CM

## 2015-10-20 DIAGNOSIS — E782 Mixed hyperlipidemia: Secondary | ICD-10-CM | POA: Diagnosis present

## 2015-10-20 DIAGNOSIS — I480 Paroxysmal atrial fibrillation: Secondary | ICD-10-CM | POA: Insufficient documentation

## 2015-10-20 DIAGNOSIS — Z7982 Long term (current) use of aspirin: Secondary | ICD-10-CM | POA: Insufficient documentation

## 2015-10-20 DIAGNOSIS — D72829 Elevated white blood cell count, unspecified: Secondary | ICD-10-CM

## 2015-10-20 DIAGNOSIS — E559 Vitamin D deficiency, unspecified: Secondary | ICD-10-CM

## 2015-10-20 DIAGNOSIS — Z85820 Personal history of malignant melanoma of skin: Secondary | ICD-10-CM

## 2015-10-20 DIAGNOSIS — I4892 Unspecified atrial flutter: Secondary | ICD-10-CM | POA: Diagnosis not present

## 2015-10-20 DIAGNOSIS — Z6835 Body mass index (BMI) 35.0-35.9, adult: Secondary | ICD-10-CM

## 2015-10-20 DIAGNOSIS — Z6838 Body mass index (BMI) 38.0-38.9, adult: Secondary | ICD-10-CM

## 2015-10-20 DIAGNOSIS — E669 Obesity, unspecified: Secondary | ICD-10-CM

## 2015-10-20 DIAGNOSIS — Z882 Allergy status to sulfonamides status: Secondary | ICD-10-CM | POA: Insufficient documentation

## 2015-10-20 DIAGNOSIS — E039 Hypothyroidism, unspecified: Secondary | ICD-10-CM | POA: Diagnosis present

## 2015-10-20 DIAGNOSIS — Z888 Allergy status to other drugs, medicaments and biological substances status: Secondary | ICD-10-CM | POA: Insufficient documentation

## 2015-10-20 DIAGNOSIS — L409 Psoriasis, unspecified: Secondary | ICD-10-CM

## 2015-10-20 HISTORY — DX: Unspecified atrial flutter: I48.92

## 2015-10-20 LAB — COMPREHENSIVE METABOLIC PANEL
ALT: 22 U/L (ref 14–54)
AST: 24 U/L (ref 15–41)
Albumin: 3.9 g/dL (ref 3.5–5.0)
Alkaline Phosphatase: 48 U/L (ref 38–126)
Anion gap: 8 (ref 5–15)
BUN: 19 mg/dL (ref 6–20)
CO2: 23 mmol/L (ref 22–32)
Calcium: 9 mg/dL (ref 8.9–10.3)
Chloride: 109 mmol/L (ref 101–111)
Creatinine, Ser: 1.01 mg/dL — ABNORMAL HIGH (ref 0.44–1.00)
GFR calc Af Amer: >60 mL/min (ref >60)
GFR calc non Af Amer: >60 mL/min (ref >60)
Glucose, Bld: 116 mg/dL — ABNORMAL HIGH (ref 65–99)
Potassium: 4.8 mmol/L (ref 3.5–5.1)
Sodium: 140 mmol/L (ref 135–145)
Total Bilirubin: 1.1 mg/dL (ref 0.3–1.2)
Total Protein: 6.5 g/dL (ref 6.5–8.1)

## 2015-10-20 LAB — CBC
HCT: 40.7 % (ref 36.0–46.0)
Hemoglobin: 12.9 g/dL (ref 12.0–15.0)
MCH: 28.1 pg (ref 26.0–34.0)
MCHC: 31.7 g/dL (ref 30.0–36.0)
MCV: 88.7 fL (ref 78.0–100.0)
Platelets: 410 10*3/uL — ABNORMAL HIGH (ref 150–400)
RBC: 4.59 MIL/uL (ref 3.87–5.11)
RDW: 13.9 % (ref 11.5–15.5)
WBC: 14 10*3/uL — ABNORMAL HIGH (ref 4.0–10.5)

## 2015-10-20 LAB — BRAIN NATRIURETIC PEPTIDE: B Natriuretic Peptide: 209.7 pg/mL — ABNORMAL HIGH (ref 0.0–100.0)

## 2015-10-20 LAB — TSH: TSH: 0.951 u[IU]/mL (ref 0.350–4.500)

## 2015-10-20 MED ORDER — FUROSEMIDE 10 MG/ML IJ SOLN
20.0000 mg | Freq: Once | INTRAMUSCULAR | Status: AC
  Administered 2015-10-20: 20 mg via INTRAVENOUS
  Filled 2015-10-20: qty 2

## 2015-10-20 MED ORDER — ALBUTEROL SULFATE HFA 108 (90 BASE) MCG/ACT IN AERS: 2.0000 | INHALATION_SPRAY | Freq: Four times a day (QID) | RESPIRATORY_TRACT | Status: DC | PRN

## 2015-10-20 MED ORDER — DILTIAZEM HCL 100 MG IV SOLR
5.0000 mg/h | INTRAVENOUS | Status: DC
  Administered 2015-10-20: 15 mg/h via INTRAVENOUS
  Administered 2015-10-20: 5 mg/h via INTRAVENOUS
  Administered 2015-10-21 (×2): 15 mg/h via INTRAVENOUS
  Filled 2015-10-20 (×4): qty 100

## 2015-10-20 MED ORDER — LEVOTHYROXINE SODIUM 50 MCG PO TABS
50.0000 ug | ORAL_TABLET | Freq: Every day | ORAL | Status: DC
  Administered 2015-10-21 – 2015-10-22 (×2): 50 ug via ORAL
  Filled 2015-10-20 (×2): qty 1

## 2015-10-20 MED ORDER — APIXABAN 5 MG PO TABS
5.0000 mg | ORAL_TABLET | Freq: Two times a day (BID) | ORAL | Status: DC
  Administered 2015-10-20 – 2015-10-21 (×3): 5 mg via ORAL
  Filled 2015-10-20 (×4): qty 1

## 2015-10-20 MED ORDER — ACETAMINOPHEN 325 MG PO TABS: 650.0000 mg | ORAL_TABLET | ORAL | Status: DC | PRN

## 2015-10-20 MED ORDER — ONDANSETRON HCL 4 MG/2ML IJ SOLN: 4.0000 mg | Freq: Four times a day (QID) | INTRAMUSCULAR | Status: DC | PRN

## 2015-10-20 MED ORDER — LORATADINE 10 MG PO TABS
10.0000 mg | ORAL_TABLET | Freq: Every day | ORAL | Status: DC
  Administered 2015-10-21: 10 mg via ORAL
  Filled 2015-10-20 (×2): qty 1

## 2015-10-20 MED ORDER — ALBUTEROL SULFATE (2.5 MG/3ML) 0.083% IN NEBU
2.5000 mg | INHALATION_SOLUTION | Freq: Four times a day (QID) | RESPIRATORY_TRACT | Status: DC | PRN
  Administered 2015-10-20: 2.5 mg via RESPIRATORY_TRACT
  Filled 2015-10-20: qty 3

## 2015-10-20 MED ORDER — MONTELUKAST SODIUM 10 MG PO TABS
10.0000 mg | ORAL_TABLET | Freq: Every day | ORAL | Status: DC
  Administered 2015-10-21: 10 mg via ORAL
  Filled 2015-10-20 (×2): qty 1

## 2015-10-20 MED ORDER — DILTIAZEM LOAD VIA INFUSION
10.0000 mg | Freq: Once | INTRAVENOUS | Status: AC
  Administered 2015-10-20: 10 mg via INTRAVENOUS
  Filled 2015-10-20: qty 10

## 2015-10-20 NOTE — H&P (Signed)
Expand All Collapse All      Cardiology Office Note    Date: 10/20/2015   ID: Monica Morgan, DOB 1960-05-03, MRN [id number]  PCP: Alesia Richards, MD Cardiologist: Dr. Johnsie Cancel   CC: Aflutter and SOB.  History of Present Illness:  Monica Morgan is a 55 y.o. female with a history of PAF on Multaq, HLD, psoriasis, hypothyroidism who presents for evaluation of worsening PAF and SOB.   She relocated from [place] a few years ago. Per Dr. Kyla Morgan notes, she has no history of structural heart disease and has never required DCCV. ETT in 2013 was normal. She was previously on Rhythmol but gained a significant amount of weight and had no relief of AF, so was changed to Multaq. The patient eventually titrated the dose down herself. She found that a combination of 60lb weight loss + Multaq PRN helped to significantly reduce her afib burden. She has not been on anticoagulation in the past due to low CHADS score (prior to use of CHADSVASC). No recent echo available. CBC/BMET/Mg/TSH/A1C/LFTs in 12/2014 were normal. PCP following lipids. She denies h/o HTN, CVA, TIA or known vascular disease.  She was seen by Monica Morgan in 02/2015 for follow up. She noticed worsening aifb after a 10 lb weight and was taking 1/2 tablet Multaq daily PRN. 2D ECHO was ordered which showed normal LV function, severe LAE, mild MR. It was suggested that she take her Multaq as prescribed regularly at therapeutic dose (400mg  BID) vs PRN.   Today she presents to clinic for worsening PAF and SOB. She had what she thinks was a bronchitis with wheezing and coughing for 1 week. She is in the middle of a move and she thinks the dust has made everything worse. She was recently placed on steroids and cough syrup. She has been feeling terrible with fatigue and weakness all week. She thinks she has been out of rhythm for at least 1 week. She has not had any LE edema or PND, but was sleeping in a recliner for a couple days  this week. No chest pain. No dizziness or syncope. No blood in her stool or urine. She has been taking her Mutaq as prescribed 400mg  BID for at least the last 2 months.     Past Medical History  Diagnosis Date  . Hyperlipidemia   . Melanoma ([place])     leg  . Rosacea   . Psoriasis   . Vitamin D deficiency   . Hypothyroidism     Past Surgical History  Procedure Laterality Date  . Skin cancer excision      leg  . Cesarean section with bilateral tubal ligation    . Cesarean section    . Cervical ablation      Current Medications: Outpatient Prescriptions Prior to Visit  Medication Sig Dispense Refill  . aspirin 81 MG tablet Take 81 mg by mouth daily.    Marland Kitchen augmented betamethasone dipropionate (DIPROLENE-AF) 0.05 % cream APPLY TWICE DAILY FOR PSORIASIS. DO NOT APPLY TO FACE 50 g 1  . betamethasone dipropionate (DIPROLENE) 0.05 % cream Apply 1 application topically as needed.    . Calcium 600-400 MG-UNIT CHEW Chew by mouth.    . Cholecalciferol (VITAMIN D PO) Take 5,000 Units by mouth daily.    . Coenzyme Q10 (CO Q10) 100 MG CAPS Take 1 capsule by mouth daily.    Marland Kitchen dronedarone (MULTAQ) 400 MG tablet Take 1 tablet (400 mg total) by mouth 2 (two) times daily with a meal.  180 tablet 1  . ibuprofen (ADVIL,MOTRIN) 800 MG tablet Take 1 tablet (800 mg total) by mouth every 6 (six) hours as needed. 60 tablet 2  . levothyroxine (SYNTHROID, LEVOTHROID) 50 MCG tablet TAKE 1 TABLET (50 MCG TOTAL) BY MOUTH DAILY. 90 tablet 1  . Magnesium 250 MG TABS Take 2 tablets by mouth daily.    . montelukast (SINGULAIR) 10 MG tablet TAKE 1 TABLET BY MOUTH EVERY DAY 90 tablet 1  . NONFORMULARY OR COMPOUNDED ITEM 3 (three) times a week. Estriol/testosterone 0.25-0.25 suppository compound    . NONFORMULARY OR COMPOUNDED ITEM Place onto the skin daily. Biest 8/2 HRT compound    . OVER THE  COUNTER MEDICATION Multivitamin and diet supplement    . Probiotic Product (PROBIOTIC DAILY PO) Take 1 tablet by mouth daily.    Marland Kitchen PROGESTERONE, VAGINAL, 4 % GEL Place onto the skin. Days 1-25 of each month    . Turmeric 500 MG CAPS Take 1 tablet 2 x day    . cyclobenzaprine (FLEXERIL) 10 MG tablet Take 1 tablet (10 mg total) by mouth every 8 (eight) hours as needed for muscle spasms. 30 tablet 1  . OVER THE COUNTER MEDICATION Tumeric 2 capsules daily     No facility-administered medications prior to visit.     Allergies: Lipitor; Rythmol; and Sulfa drugs cross reactors   Social History   Social History  . Marital Status: Divorced    Spouse Name: N/A  . Number of Children: N/A  . Years of Education: N/A   Social History Main Topics  . Smoking status: Never Smoker   . Smokeless tobacco: Never Used  . Alcohol Use: Yes     Comment: socially,weekends at beach  . Drug Use: No  . Sexual Activity: Not Asked   Other Topics Concern  . None   Social History Narrative     Family History: The patient's family history includes Diabetes in her father; Heart attack in her brother and mother; Heart disease in her father and mother; Hypertension in her father and mother. There is no history of Colon cancer or Stroke.   ROS:  Please see the history of present illness.  ROS All other systems reviewed and are negative.   PHYSICAL EXAM:   VS: BP 128/82 mmHg  Pulse 150  Ht 5' 5.25" (1.657 m)  Wt 212 lb 12.8 oz (96.525 kg)  BMI 35.16 kg/m2  GEN: Well nourished, well developed, in no acute distress  HEENT: normal  Neck: no JVD, carotid bruits, or masses Cardiac: irreg irreg tachy; no murmurs, rubs, or gallops,no edema  Respiratory: clear to auscultation bilaterally, normal work of breathing GI: soft, nontender, nondistended, + BS MS: no deformity or atrophy  Skin: warm and dry, no  rash Neuro: Alert and Oriented x 3, Strength and sensation are intact Psych: euthymic mood, full affect  Wt Readings from Last 3 Encounters:  10/20/15 212 lb 12.8 oz (96.525 kg)  06/23/15 202 lb (91.627 kg)  03/07/15 210 lb (95.255 kg)      Studies/Labs Reviewed:   EKG: EKG is ordered today. The ekg ordered today demonstrates atrial flutter with RVR HR 151 2:1 AV conduction  Recent Labs: 12/22/2014: Magnesium 2.3; TSH 1.540 06/23/2015: ALT 11; BUN 14; Creat 0.81; Hemoglobin 13.2; Platelets 296; Potassium 4.5; Sodium 139   Lipid Panel  Labs (Brief)       Component Value Date/Time   CHOL 195 06/23/2015 0921   TRIG 113 06/23/2015 0921   HDL 46 06/23/2015  0921   CHOLHDL 4.2 06/23/2015 0921   VLDL 23 06/23/2015 0921   LDLCALC 126 06/23/2015 0921      Additional studies/ records that were reviewed today include:  2D ECHO: 03/16/2015 LV EF: 55% - 60% Study Conclusions - Left ventricle: The cavity size was normal. Systolic function was normal. The estimated ejection fraction was in the range of 55% to 60%. Wall motion was normal; there were no regional wall motion abnormalities. - Mitral valve: There was mild regurgitation. - Left atrium: The atrium was severely dilated. - Atrial septum: No defect or patent foramen ovale was identified.    ASSESSMENT & PLAN:   Aflutter with RVR- 2:1 conduction. HR 151. She has been on Multaq 400mg  BID x 2 months (beofre she was taking PRN). She has not been on anticoagulation as CHADSVASC is only 1 (she confirms she does not have hypertension). She has very symptomatic with SOB and may have early CHF. Plan will be to admit her and start her on Eliquis 5mg  BID. SHe was given one dose of Eliquis 5mg  this AM in the office. Will start IV dilt when she gets to hospital for rate control and hold her Multaq. Plan will be for TEE/DCCV if she does not convert after at least 5 doses of Eliquis. I will also  give her one dose of IV lasix 20mg  when she arrives at the hospital.   HLD- followed by PCP. Recent lipid panel with LDL 126.  Hypothyroidism - TSH normal by labs 12/2014. Will recheck when admitted.   Obesity - she has gained more weight recently and knows this contributes to her afib burden.

## 2015-10-20 NOTE — Progress Notes (Signed)
Pt admitted with afib RVR HR 140s-150s. Gave cardizem bolus. Began cardizem drip at 5 mL/hr at 1200 per orders. Pt HR remain in afib 140s, BP 130/73, increased cardizem drip to 7.5 mL/hr at 1235.  Pt HR remain in afib 120s, BP 136/98, increased cardizem to 10 mL/hr at 1311. Pt HR remain in afib 105-110s, increased cardizem drip to 12.5 mL/hr at 1426. Pt HR sustained HR 80s-90s.  At 1720 received call from CCMD that pt was sustained HR 147, BP 123/44. Increased cardizem drip to 15 mL/hr at 1732. Will continue to monitor.   Fritz Pickerel, RN

## 2015-10-20 NOTE — Progress Notes (Signed)
Pt oriented to room and equipment. Vital signs documented. Telemetry applied, CCMD notified x2. Melvyn Neth, cardiology PA, at bedside. IV placed, cardizem started. Call light within reach, will continue to monitor.    Fritz Pickerel, RN

## 2015-10-20 NOTE — Patient Instructions (Addendum)
You are being direct admitted into [facility]. Please go to the [facility] (where they offer valet parking) Go through the double doors and go to admitting up on the left.  You will be going to [facility]

## 2015-10-21 DIAGNOSIS — I4892 Unspecified atrial flutter: Secondary | ICD-10-CM | POA: Diagnosis not present

## 2015-10-21 MED ORDER — DILTIAZEM HCL 60 MG PO TABS
90.0000 mg | ORAL_TABLET | Freq: Four times a day (QID) | ORAL | Status: DC
  Administered 2015-10-21 – 2015-10-22 (×4): 90 mg via ORAL
  Filled 2015-10-21 (×4): qty 1

## 2015-10-21 NOTE — Progress Notes (Signed)
Pt in Afib/Aflutter last night. Cardizem gtt still at 74ml/hr. Pt tolerated well. No complaints at this time; pt resting in bed. Will continue to monitor.  Arnell Sieving, RN

## 2015-10-21 NOTE — Progress Notes (Signed)
TELEMETRY: Reviewed telemetry pt in atrial fib/flutter with controlled rate: Filed Vitals:   10/20/15 2033 10/20/15 2219 10/21/15 0032 10/21/15 0532  BP: 115/68  144/63 131/68  Pulse: 50  69 78  Temp: 98.3 F (36.8 C)   97.5 F (36.4 C)  TempSrc: Oral   Oral  Resp: 16   16  Weight:      SpO2: 97% 97%  97%    Intake/Output Summary (Last 24 hours) at 10/21/15 0905 Last data filed at 10/20/15 1810  Gross per 24 hour  Intake    240 ml  Output      0 ml  Net    240 ml   Filed Weights   10/20/15 1048  Weight: 212 lb 3.2 oz (96.253 kg)    Subjective Feels much better today. No dyspnea or chest pain.  Marland Kitchen apixaban  5 mg Oral BID  . levothyroxine  50 mcg Oral QAC breakfast  . loratadine  10 mg Oral Daily  . montelukast  10 mg Oral Daily   . diltiazem (CARDIZEM) infusion 15 mg/hr (10/21/15 0636)    LABS: Basic Metabolic Panel:  Recent Labs  10/20/15 1200  NA 140  K 4.8  CL 109  CO2 23  GLUCOSE 116*  BUN 19  CREATININE 1.01*  CALCIUM 9.0   Liver Function Tests:  Recent Labs  10/20/15 1200  AST 24  ALT 22  ALKPHOS 48  BILITOT 1.1  PROT 6.5  ALBUMIN 3.9   No results for input(s): LIPASE, AMYLASE in the last 72 hours. CBC:  Recent Labs  10/20/15 1200  WBC 14.0*  HGB 12.9  HCT 40.7  MCV 88.7  PLT 410*   Cardiac Enzymes: No results for input(s): CKTOTAL, CKMB, CKMBINDEX, TROPONINI in the last 72 hours. BNP: No results for input(s): PROBNP in the last 72 hours. D-Dimer: No results for input(s): DDIMER in the last 72 hours. Hemoglobin A1C: No results for input(s): HGBA1C in the last 72 hours. Fasting Lipid Panel: No results for input(s): CHOL, HDL, LDLCALC, TRIG, CHOLHDL, LDLDIRECT in the last 72 hours. Thyroid Function Tests:  Recent Labs  10/20/15 1200  TSH 0.951   Ecg 7/6 shows atrial flutter with 2:1 conduction. Poor R wave progression.  Radiology/Studies:  No results found.   ETT 02/07/12: Normal.  Echo: 03/16/15: Study  Conclusions  - Left ventricle: The cavity size was normal. Systolic function was  normal. The estimated ejection fraction was in the range of 55%  to 60%. Wall motion was normal; there were no regional wall  motion abnormalities. - Mitral valve: There was mild regurgitation. - Left atrium: The atrium was severely dilated. - Atrial septum: No defect or patent foramen ovale was identified.  PHYSICAL EXAM General: Well developed, well nourished, in no acute distress. Head: Normocephalic, atraumatic, sclera non-icteric, oropharynx is clear Neck: Negative for carotid bruits. JVD not elevated. No adenopathy Lungs: Clear bilaterally to auscultation without wheezes, rales, or rhonchi. Breathing is unlabored. Heart: RRR S1 S2 without murmurs, rubs, or gallops.  Abdomen: Soft, non-tender, non-distended with normoactive bowel sounds. No hepatomegaly. No rebound/guarding. No obvious abdominal masses. Msk:  Strength and tone appears normal for age. Extremities: No clubbing, cyanosis or edema.  Distal pedal pulses are 2+ and equal bilaterally. Neuro: Alert and oriented X 3. Moves all extremities spontaneously. Psych:  Responds to questions appropriately with a normal affect.  ASSESSMENT AND PLAN: 1. Atrial fibrillation/flutter. Admitted with RVR in 2:1 flutter. Telemetry this am looks more like Afib. Has  been on Multaq intermittently but this is the longest she has been out of rhythm. Rate now controlled on IV diltiazem at 15 mg/hr. Will transition to po diltiazem 90 mg qid today. If rate still controlled can switch to long acting diltiazem tomorrow. Initiated on Eliquis yesterday PM. Will need at least 5 doses before considering TEE guided DCCV. If rate controlled could DC in am and return for TEE/DCCV as outpatient. We discussed alternative anti-arrhythmic drug therapy. Given severe LAE risk of recurrent Afib is high. More potent agents include Tikosyn or amiodarone. She would like to continue to try  Multaq. She may be a candidate for atrial flutter ablation given her current presentation. Would consider EP evaluation. 2. Obesity. Understands importance of weight loss.  3. HLD  4. Hypothyroidism   Present on Admission:  . Atrial flutter with rapid ventricular response ([place])  Signed, Peter Martinique, [place] 10/21/2015 9:05 AM

## 2015-10-22 ENCOUNTER — Encounter (HOSPITAL_COMMUNITY): Payer: Self-pay | Admitting: *Deleted

## 2015-10-22 ENCOUNTER — Other Ambulatory Visit: Payer: Self-pay | Admitting: Internal Medicine

## 2015-10-22 DIAGNOSIS — I4892 Unspecified atrial flutter: Secondary | ICD-10-CM | POA: Diagnosis not present

## 2015-10-22 DIAGNOSIS — D72829 Elevated white blood cell count, unspecified: Secondary | ICD-10-CM

## 2015-10-22 MED ORDER — DILTIAZEM HCL ER COATED BEADS 180 MG PO CP24
360.0000 mg | ORAL_CAPSULE | Freq: Every day | ORAL | Status: DC
  Filled 2015-10-22: qty 2

## 2015-10-22 MED ORDER — APIXABAN 5 MG PO TABS: 5.0000 mg | ORAL_TABLET | Freq: Two times a day (BID) | ORAL | Status: DC

## 2015-10-22 MED ORDER — DILTIAZEM HCL ER COATED BEADS 360 MG PO CP24: 360.0000 mg | ORAL_CAPSULE | Freq: Every day | ORAL | Status: DC

## 2015-10-22 NOTE — Progress Notes (Signed)
The patient requested work note - she does not want to wait until after dccv is completed; says she needs to go back to work on Monday. D/w Dr. Radford Pax. We have instructed her only to return if feeling well. Dayna Dunn PA-C

## 2015-10-22 NOTE — Discharge Summary (Signed)
Discharge Summary    Patient ID: Monica Morgan,  MRN: [id number], DOB/AGE: 57-Jul-1962 57 y.o.  Admit date: 10/20/2015 Discharge date: 10/22/2015  Primary Care Provider: MCKEOWN,WILLIAM DAVID Primary Cardiologist: Dr. Johnsie Cancel  Discharge Diagnoses    Principal Problem:   Atrial flutter with rapid ventricular response ([place]) Active Problems:   Mixed hyperlipidemia   PAF (paroxysmal atrial fibrillation) (HCC)   Hypothyroidism   Obesity   Leukocytosis    Diagnostic Studies/Procedures    na _____________   History of Present Illness & Hospital Course    Monica Morgan is a 55 y.o. female with a history of PAF on Multaq, HLD, psoriasis, hypothyroidism who presents for evaluation of worsening PAF and SOB.   She relocated from [place] a few years ago. Per Dr. [name] notes, she has no history of structural heart disease and has never required DCCV. ETT in 2013 was normal. She was previously on Rhythmol but gained a significant amount of weight and had no relief of AF, so was changed to Multaq. The patient eventually titrated the dose down herself. She found that a combination of 60lb weight loss + Multaq PRN helped to significantly reduce her afib burden. She was not previously on anticoagulation in the past due to low CHADS score (prior to use of CHADSVASC).   I Monica Morgan) saw her in follow-up 02/2015 at which time she reported worsening aifb after a 10 lb weight and was taking 1/2 tablet Multaq daily PRN. 2D ECHO was ordered which showed normal LV function, severe LAE, mild MR. It was suggested that she take her Multaq as prescribed regularly at therapeutic dose (400mg  BID) rather than the PRN dose the patient had elected. She presented back to clinic 10/20/15 for worsening arrhythmia and SOB. She had what she thinks was a bronchitis with wheezing and coughing for 1 week. She is in the middle of a move and was recently placed on steroids and cough syrup. She reported fatigue and weakness  all week. She thinks she has been out of rhythm for at least 1 week. She has not had any LE edema or PND, but was sleeping in a recliner for a couple days. She had been taking her Mutaq as prescribed 400mg  BID for at least the last 2 months. EKG showed atrial flutter 2:1 with HR 151. She was admitted to the hospital for further management with IV Cardizem, anticoagulation with Eliquis and 20mg  IV Lasix. The Cardizem helped to improve her HR. Telemetry this morning looks like afib. She was switched to long acting diltiazem. Labs notable for WBC 14k, glucose 116, BNP 209, WBC 14, TSH wnl. The plan is to discharge her home and set her up with outpatient TEE/DCCV on Tuesday (needs at least 5 doses of Eliquis before cardioverting her, and she feels too well to remain as inpatient). Per MD notes, she will likely need alternative anti-arrhythmic drug therapy given severe LAE risk of recurrent Afib is high. More potent agents include Tikosyn or amiodarone. She would like to continue to try Multaq. She may be a candidate for atrial flutter ablation given her current presentation. Will have her f/u with the EP afib clinic in 1 week - sent a message to schedulers to arrange this, as well as TEE/DCCV on Tuesday with Dr. Radford Pax. Dr. Radford Pax has seen and examined the patient today and feels she is stable for discharge. Weight loss was advised.  Would consider repeat CBC in f/u to ensure resolution since WBC was elevated this admission as  well as A1C to r/o DM.  _____________  Discharge Vitals Blood pressure 148/75, pulse 80, temperature 98.3 F (36.8 C), temperature source Oral, resp. rate 16, height 5\' 5"  (1.651 m), weight 212 lb 3.2 oz (96.253 kg), SpO2 98 %.  Filed Weights   10/20/15 1048  Weight: 212 lb 3.2 oz (96.253 kg)    Labs & Radiologic Studies    CBC  Recent Labs  10/20/15 1200  WBC 14.0*  HGB 12.9  HCT 40.7  MCV 88.7  PLT 123XX123*   Basic Metabolic Panel  Recent Labs  10/20/15 1200  NA 140    K 4.8  CL 109  CO2 23  GLUCOSE 116*  BUN 19  CREATININE 1.01*  CALCIUM 9.0   Liver Function Tests  Recent Labs  10/20/15 1200  AST 24  ALT 22  ALKPHOS 48  BILITOT 1.1  PROT 6.5  ALBUMIN 3.9  Thyroid Function Tests  Recent Labs  10/20/15 1200  TSH 0.951   _____________  No results found. Disposition   Pt is being discharged home today in good condition.  Follow-up Plans & Appointments    Follow-up Information    Follow up with [facility].   Specialty:  Cardiology   Why:  [facility] - the office will call you to schedule your TEE/cardioversion and follow-up appoinment.   Contact information:   [address], [place] [phone]     Discharge Instructions    Diet - low sodium heart healthy    Complete by:  As directed      Increase activity slowly    Complete by:  As directed   Stop aspirin and start Eliquis.  Restart Multaq after your cardioversion.  Patients taking blood thinners should generally stay away from medicines like ibuprofen, Advil, Motrin, naproxen, and Aleve due to risk of stomach bleeding. You may take Tylenol as directed or talk to your primary doctor about alternatives.           Discharge Medications   Current Discharge Medication List    START taking these medications   Details  apixaban (ELIQUIS) 5 MG TABS tablet Take 1 tablet (5 mg total) by mouth 2 (two) times daily. Qty: 60 tablet, Refills: 6    diltiazem (CARDIZEM CD) 360 MG 24 hr capsule Take 1 capsule (360 mg total) by mouth daily. Qty: 30 capsule, Refills: 6      CONTINUE these medications which have NOT CHANGED   Details  augmented betamethasone dipropionate (DIPROLENE-AF) 0.05 % cream APPLY TWICE DAILY FOR PSORIASIS. DO NOT APPLY TO FACE     betamethasone dipropionate (DIPROLENE) 0.05 % cream Apply 1 application topically as needed.    Calcium 600-400 MG-UNIT CHEW Chew by mouth.     Cholecalciferol (VITAMIN D PO) Take 5,000 Units by mouth daily.    Coenzyme Q10 (CO Q10) 100 MG CAPS Take 1 capsule by mouth daily.    dronedarone (MULTAQ) 400 MG tablet Take 1 tablet (400 mg total) by mouth 2 (two) times daily with a meal. Notes to Patient: !!!!!!!!!!!!!!!!!!!!!!!!!!!!!!!!!!!!!!!!!!!!!!!!!!!!! Restart after your cardioversion.   Associated Diagnoses: PAF (paroxysmal atrial fibrillation) (HCC)    levothyroxine (SYNTHROID, LEVOTHROID) 50 MCG tablet TAKE 1 TABLET (50 MCG TOTAL) BY MOUTH DAILY.     loratadine (CLARITIN) 10 MG tablet TAKE 1 TABLET BY ORAL ROUTE EVERY DAY FOR ALLERGY RELIEF     Magnesium 250 MG TABS Take 2 tablets by mouth daily.  methylPREDNISolone (MEDROL DOSEPAK) 4 MG TBPK tablet Take 4 mg by mouth daily.     montelukast (SINGULAIR) 10 MG tablet TAKE 1 TABLET BY MOUTH EVERY DAY     naphazoline-pheniramine (NAPHCON-A) 0.025-0.3 % ophthalmic solution Place 1 drop into both eyes every morning.    !! NONFORMULARY OR COMPOUNDED ITEM 4 (four) times a week. Estriol/testosterone 0.25-0.25 suppository compound. Take on Monday Tuesday Thursday Friday    !! NONFORMULARY OR COMPOUNDED ITEM Place onto the skin daily. Biest 8/2 HRT compound    OVER THE COUNTER MEDICATION Multivitamin and diet supplement    PROAIR HFA 108 (90 Base) MCG/ACT inhaler INHALE 2 PUFF BY INHALATION ROUTE EVERY 4 - 6 HOURS AS NEEDED for shortness of breath Refills: 0    Probiotic Product (PROBIOTIC DAILY PO) Take 1 tablet by mouth daily.    PROGESTERONE, VAGINAL, 4 % GEL Place onto the skin. Days 1-25 of each month    Turmeric 500 MG CAPS Take 1 tablet 2 x day   Associated Diagnoses: Joint ache     !! - Potential duplicate medications found. Please discuss with provider.    STOP taking these medications     aspirin 81 MG tablet      ibuprofen (ADVIL,MOTRIN) 800 MG tablet          Allergies:  Allergies  Allergen Reactions  . Lipitor [Atorvastatin] Other (See Comments)     "neck pain"  . Rythmol [Propafenone] Other (See Comments)    "weight gain"  . Sulfa Drugs Cross Reactors Other (See Comments)    "soreness all over"     Outstanding Labs/Studies   na  Duration of Discharge Encounter   Greater than 30 minutes including physician time.  Signed, Charlie Pitter PA-C 10/22/2015, 10:19 AM

## 2015-10-22 NOTE — Progress Notes (Signed)
SUBJECTIVE:  No complaints.  Wants to go home  OBJECTIVE:   Vitals:   Filed Vitals:   10/21/15 0532 10/21/15 1408 10/21/15 2115 10/22/15 0451  BP: 131/68 125/66 129/69 148/75  Pulse: 78 66 68 80  Temp: 97.5 F (36.4 C) 98 F (36.7 C) 98.7 F (37.1 C) 98.3 F (36.8 C)  TempSrc: Oral Oral Oral Oral  Resp: 16 18 16 16   Weight:      SpO2: 97% 98% 100% 98%   I&O's:  No intake or output data in the 24 hours ending 10/22/15 0836 TELEMETRY: Reviewed telemetry pt in Atrial fibrillation:     PHYSICAL EXAM General: Well developed, well nourished, in no acute distress Head: Eyes PERRLA, No xanthomas.   Normal cephalic and atramatic  Lungs:   Clear bilaterally to auscultation and percussion. Heart:   irreguarly irregular S1 S2 Pulses are 2+ & equal. Abdomen: Bowel sounds are positive, abdomen soft and non-tender without masses  Msk:  Back normal, normal gait. Normal strength and tone for age. Extremities:   No clubbing, cyanosis or edema.  DP +1 Neuro: Alert and oriented X 3. Psych:  Good affect, responds appropriately   LABS: Basic Metabolic Panel:  Recent Labs  10/20/15 1200  NA 140  K 4.8  CL 109  CO2 23  GLUCOSE 116*  BUN 19  CREATININE 1.01*  CALCIUM 9.0   Liver Function Tests:  Recent Labs  10/20/15 1200  AST 24  ALT 22  ALKPHOS 48  BILITOT 1.1  PROT 6.5  ALBUMIN 3.9   No results for input(s): LIPASE, AMYLASE in the last 72 hours. CBC:  Recent Labs  10/20/15 1200  WBC 14.0*  HGB 12.9  HCT 40.7  MCV 88.7  PLT 410*   Cardiac Enzymes: No results for input(s): CKTOTAL, CKMB, CKMBINDEX, TROPONINI in the last 72 hours. BNP: Invalid input(s): POCBNP D-Dimer: No results for input(s): DDIMER in the last 72 hours. Hemoglobin A1C: No results for input(s): HGBA1C in the last 72 hours. Fasting Lipid Panel: No results for input(s): CHOL, HDL, LDLCALC, TRIG, CHOLHDL, LDLDIRECT in the last 72 hours. Thyroid Function Tests:  Recent Labs   10/20/15 1200  TSH 0.951   Anemia Panel: No results for input(s): VITAMINB12, FOLATE, FERRITIN, TIBC, IRON, RETICCTPCT in the last 72 hours. Coag Panel:   No results found for: INR, PROTIME  RADIOLOGY: No results found.  ASSESSMENT AND PLAN: 1. Atrial fibrillation/flutter. Admitted with RVR in 2:1 flutter. Telemetry this am looks more like Afib. Has been on Multaq intermittently but this is the longest she has been out of rhythm. Rate now controlled on po diltiazem 90 mg qid. Will switch to long acting diltiazem today. Initiated on Eliquis.   She wants to go home so will discharge home and set up TEE/DCCV as outpatient. She will likely need alternative anti-arrhythmic drug therapy given severe LAE risk of recurrent Afib is high. More potent agents include Tikosyn or amiodarone. She would like to continue to try Multaq. She may be a candidate for atrial flutter ablation given her current presentation. Would consider EP evaluation as an outpt after TEE/DCCV. 2. Obesity. Understands importance of weight loss.  3. HLD  4. Hypothyroidism  Patient is stable from cardiac standpoint with controlled HR in afib.  Wants to go home.  Will d/c home today and set up outpt TEE/DCCV next week.  Will get outpt EP referral for possible ablation . Restart Multaq after TEE/DCCV.   Fransico Him, MD  10/22/2015  8:36 AM

## 2015-10-22 NOTE — Progress Notes (Signed)
Discharged to home with family office visits in place teaching done  

## 2015-10-22 NOTE — Care Management Note (Signed)
Case Management Note  Patient Details  Name: Monica Morgan MRN: [id number] Date of Birth: 1960/06/07  Subjective/Objective:                  Principal Problem:  Atrial flutter with rapid ventricular response ([place])  Action/Plan: Cm spoke to patient and spouse at the bedside. CM provided patient with $10 copay card and 30 day free Eliquis card and explained the program. No further needs communicated at this time and patient is preparing for discharge home.   Expected Discharge Date:  10/22/15               Expected Discharge Plan:  Home/Self Care  In-House Referral:     Discharge planning Services  CM Consult, Medication Assistance  Post Acute Care Choice:    Choice offered to:     DME Arranged:    DME Agency:     HH Arranged:    HH Agency:     Status of Service:  Completed, signed off  If discussed at [employer] of Stay Meetings, dates discussed:    Additional Comments:  Guido Sander, RN 10/22/2015, 11:49 AM

## 2015-10-24 ENCOUNTER — Telehealth: Payer: Self-pay | Admitting: Cardiology

## 2015-10-24 NOTE — Telephone Encounter (Signed)
New message  Pt call requesting to speak with RN about a cardioversion that she states was to be schedule for her. Please call back to discuss

## 2015-10-24 NOTE — Telephone Encounter (Signed)
Spoke with pt who is reporting she was to call the office if she had not heard about the time of her outpt TEE guided cardioversion for Tuesday.  After reviewing the discharge summary I can see she was instructed to have a TEE/cardioversion on Tuesday.  Was able to schedule pt for Tuesday at 3 pm at [facility] # [id number] with Dr Radford Pax. Reviewed instructions with pt who states understanding.  She knows nothing to eat/drink after midnight except water to take medication, to have someone with her and drive her home.

## 2015-10-25 ENCOUNTER — Ambulatory Visit (HOSPITAL_COMMUNITY): Payer: 59 | Admitting: Certified Registered"

## 2015-10-25 ENCOUNTER — Other Ambulatory Visit: Payer: Self-pay | Admitting: Cardiology

## 2015-10-25 ENCOUNTER — Encounter (HOSPITAL_COMMUNITY): Payer: Self-pay | Admitting: *Deleted

## 2015-10-25 ENCOUNTER — Inpatient Hospital Stay (HOSPITAL_COMMUNITY)
Admission: AD | Admit: 2015-10-25 | Discharge: 2015-10-28 | DRG: 310 | Disposition: A | Discharge status: Home / Self Care | Payer: 59 | Source: Ambulatory Visit | Attending: Internal Medicine | Admitting: Internal Medicine

## 2015-10-25 ENCOUNTER — Encounter (HOSPITAL_COMMUNITY)
Admission: AD | Disposition: A | Discharge status: Home / Self Care | Payer: Self-pay | Source: Ambulatory Visit | Attending: Internal Medicine

## 2015-10-25 ENCOUNTER — Ambulatory Visit (HOSPITAL_COMMUNITY)
Admission: RE | Admit: 2015-10-25 | Discharge: 2015-10-25 | Disposition: A | Discharge status: Home / Self Care | Payer: 59 | Source: Ambulatory Visit | Attending: Cardiology | Admitting: Cardiology

## 2015-10-25 DIAGNOSIS — Z833 Family history of diabetes mellitus: Secondary | ICD-10-CM

## 2015-10-25 DIAGNOSIS — E669 Obesity, unspecified: Secondary | ICD-10-CM | POA: Diagnosis present

## 2015-10-25 DIAGNOSIS — I480 Paroxysmal atrial fibrillation: Secondary | ICD-10-CM | POA: Diagnosis present

## 2015-10-25 DIAGNOSIS — I255 Ischemic cardiomyopathy: Secondary | ICD-10-CM | POA: Diagnosis not present

## 2015-10-25 DIAGNOSIS — Z8249 Family history of ischemic heart disease and other diseases of the circulatory system: Secondary | ICD-10-CM | POA: Diagnosis not present

## 2015-10-25 DIAGNOSIS — I4892 Unspecified atrial flutter: Secondary | ICD-10-CM | POA: Diagnosis present

## 2015-10-25 DIAGNOSIS — Z85820 Personal history of malignant melanoma of skin: Secondary | ICD-10-CM | POA: Diagnosis not present

## 2015-10-25 DIAGNOSIS — I420 Dilated cardiomyopathy: Secondary | ICD-10-CM | POA: Diagnosis not present

## 2015-10-25 DIAGNOSIS — Z6835 Body mass index (BMI) 35.0-35.9, adult: Secondary | ICD-10-CM | POA: Diagnosis not present

## 2015-10-25 DIAGNOSIS — I429 Cardiomyopathy, unspecified: Secondary | ICD-10-CM | POA: Diagnosis present

## 2015-10-25 DIAGNOSIS — L409 Psoriasis, unspecified: Secondary | ICD-10-CM | POA: Diagnosis present

## 2015-10-25 DIAGNOSIS — E785 Hyperlipidemia, unspecified: Secondary | ICD-10-CM | POA: Diagnosis present

## 2015-10-25 DIAGNOSIS — I4819 Other persistent atrial fibrillation: Secondary | ICD-10-CM | POA: Insufficient documentation

## 2015-10-25 DIAGNOSIS — E039 Hypothyroidism, unspecified: Secondary | ICD-10-CM | POA: Diagnosis present

## 2015-10-25 DIAGNOSIS — Z7982 Long term (current) use of aspirin: Secondary | ICD-10-CM

## 2015-10-25 DIAGNOSIS — I481 Persistent atrial fibrillation: Secondary | ICD-10-CM | POA: Diagnosis not present

## 2015-10-25 DIAGNOSIS — I340 Nonrheumatic mitral (valve) insufficiency: Secondary | ICD-10-CM

## 2015-10-25 DIAGNOSIS — Z79899 Other long term (current) drug therapy: Secondary | ICD-10-CM | POA: Diagnosis not present

## 2015-10-25 HISTORY — DX: Pneumonia, unspecified organism: J18.9

## 2015-10-25 HISTORY — DX: Unspecified osteoarthritis, unspecified site: M19.90

## 2015-10-25 HISTORY — DX: Unspecified asthma with (acute) exacerbation: J45.901

## 2015-10-25 HISTORY — DX: Cardiac murmur, unspecified: R01.1

## 2015-10-25 HISTORY — DX: Malignant melanoma of unspecified lower limb, including hip: C43.70

## 2015-10-25 HISTORY — PX: CARDIOVERSION: SHX1299

## 2015-10-25 HISTORY — PX: TEE WITHOUT CARDIOVERSION: SHX5443

## 2015-10-25 HISTORY — DX: Inflammatory liver disease, unspecified: K75.9

## 2015-10-25 LAB — BASIC METABOLIC PANEL
Anion gap: 8 (ref 5–15)
Anion gap: 8 (ref 5–15)
BUN: 12 mg/dL (ref 6–20)
BUN: 15 mg/dL (ref 6–20)
CO2: 24 mmol/L (ref 22–32)
CO2: 24 mmol/L (ref 22–32)
Calcium: 8.9 mg/dL (ref 8.9–10.3)
Calcium: 8.8 mg/dL — ABNORMAL LOW (ref 8.9–10.3)
Chloride: 106 mmol/L (ref 101–111)
Chloride: 107 mmol/L (ref 101–111)
Creatinine, Ser: 0.87 mg/dL (ref 0.44–1.00)
Creatinine, Ser: 1.12 mg/dL — ABNORMAL HIGH (ref 0.44–1.00)
GFR calc Af Amer: >60 mL/min (ref >60)
GFR calc Af Amer: >60 mL/min (ref >60)
GFR calc non Af Amer: >60 mL/min (ref >60)
GFR calc non Af Amer: 53 mL/min — ABNORMAL LOW (ref >60)
Glucose, Bld: 112 mg/dL — ABNORMAL HIGH (ref 65–99)
Glucose, Bld: 116 mg/dL — ABNORMAL HIGH (ref 65–99)
Potassium: 3.9 mmol/L (ref 3.5–5.1)
Potassium: 4 mmol/L (ref 3.5–5.1)
Sodium: 138 mmol/L (ref 135–145)
Sodium: 139 mmol/L (ref 135–145)

## 2015-10-25 LAB — MAGNESIUM: Magnesium: 2.4 mg/dL (ref 1.7–2.4)

## 2015-10-25 SURGERY — ECHOCARDIOGRAM, TRANSESOPHAGEAL
Anesthesia: Monitor Anesthesia Care

## 2015-10-25 MED ORDER — MEPERIDINE HCL 100 MG/ML IJ SOLN: 6.2500 mg | INTRAMUSCULAR | Status: DC | PRN

## 2015-10-25 MED ORDER — SODIUM CHLORIDE 0.9 % IV SOLN: 250.0000 mL | INTRAVENOUS | Status: DC | PRN

## 2015-10-25 MED ORDER — SODIUM CHLORIDE 0.9 % IV SOLN: INTRAVENOUS | Status: DC

## 2015-10-25 MED ORDER — CARVEDILOL 6.25 MG PO TABS
6.2500 mg | ORAL_TABLET | Freq: Two times a day (BID) | ORAL | Status: DC
  Administered 2015-10-25 – 2015-10-28 (×6): 6.25 mg via ORAL
  Filled 2015-10-25 (×6): qty 1

## 2015-10-25 MED ORDER — APIXABAN 5 MG PO TABS
5.0000 mg | ORAL_TABLET | Freq: Two times a day (BID) | ORAL | Status: DC
  Administered 2015-10-25 – 2015-10-28 (×6): 5 mg via ORAL
  Filled 2015-10-25 (×7): qty 1

## 2015-10-25 MED ORDER — PROPOFOL 500 MG/50ML IV EMUL
INTRAVENOUS | Status: DC | PRN
  Administered 2015-10-25: 100 ug/kg/min via INTRAVENOUS

## 2015-10-25 MED ORDER — PROPOFOL 10 MG/ML IV BOLUS
INTRAVENOUS | Status: DC | PRN
  Administered 2015-10-25: 40 mg via INTRAVENOUS

## 2015-10-25 MED ORDER — POTASSIUM CHLORIDE CRYS ER 20 MEQ PO TBCR
20.0000 meq | EXTENDED_RELEASE_TABLET | Freq: Once | ORAL | Status: AC
  Administered 2015-10-25: 20 meq via ORAL
  Filled 2015-10-25: qty 1

## 2015-10-25 MED ORDER — LIDOCAINE HCL (CARDIAC) 20 MG/ML IV SOLN
INTRAVENOUS | Status: DC | PRN
  Administered 2015-10-25: 60 mg via INTRAVENOUS

## 2015-10-25 MED ORDER — LISINOPRIL 5 MG PO TABS
5.0000 mg | ORAL_TABLET | Freq: Every day | ORAL | Status: DC
Start: 2015-10-25 — End: 2015-10-28
  Administered 2015-10-25 – 2015-10-28 (×4): 5 mg via ORAL
  Filled 2015-10-25 (×3): qty 1

## 2015-10-25 MED ORDER — DOFETILIDE 500 MCG PO CAPS
500.0000 ug | ORAL_CAPSULE | Freq: Two times a day (BID) | ORAL | Status: DC
  Administered 2015-10-25: 500 ug via ORAL
  Filled 2015-10-25: qty 1

## 2015-10-25 MED ORDER — SODIUM CHLORIDE 0.9% FLUSH: 3.0000 mL | INTRAVENOUS | Status: DC | PRN

## 2015-10-25 MED ORDER — METOCLOPRAMIDE HCL 5 MG/ML IJ SOLN
10.0000 mg | Freq: Once | INTRAMUSCULAR | Status: DC | PRN
  Filled 2015-10-25: qty 2

## 2015-10-25 MED ORDER — MAGNESIUM GLUCONATE 500 MG PO TABS
500.0000 mg | ORAL_TABLET | Freq: Every day | ORAL | Status: DC
  Administered 2015-10-25 – 2015-10-28 (×4): 500 mg via ORAL
  Filled 2015-10-25 (×5): qty 1

## 2015-10-25 MED ORDER — LACTATED RINGERS IV SOLN: INTRAVENOUS | Status: DC

## 2015-10-25 MED ORDER — MONTELUKAST SODIUM 10 MG PO TABS
10.0000 mg | ORAL_TABLET | Freq: Every day | ORAL | Status: DC
  Administered 2015-10-26 – 2015-10-28 (×3): 10 mg via ORAL
  Filled 2015-10-25 (×3): qty 1

## 2015-10-25 MED ORDER — APIXABAN 5 MG PO TABS: 5.0000 mg | ORAL_TABLET | Freq: Two times a day (BID) | ORAL | Status: DC

## 2015-10-25 MED ORDER — FENTANYL CITRATE (PF) 100 MCG/2ML IJ SOLN: 25.0000 ug | INTRAMUSCULAR | Status: DC | PRN

## 2015-10-25 MED ORDER — OFF THE BEAT BOOK
Freq: Once | Status: AC
  Administered 2015-10-25: 14:00:00
  Filled 2015-10-25: qty 1

## 2015-10-25 MED ORDER — NAPHAZOLINE-PHENIRAMINE 0.025-0.3 % OP SOLN
1.0000 [drp] | Freq: Every morning | OPHTHALMIC | Status: DC
  Administered 2015-10-25 – 2015-10-28 (×4): 1 [drp] via OPHTHALMIC
  Filled 2015-10-25: qty 5

## 2015-10-25 MED ORDER — LACTATED RINGERS IV SOLN
INTRAVENOUS | Status: DC | PRN
  Administered 2015-10-25: 11:00:00 via INTRAVENOUS

## 2015-10-25 MED ORDER — SODIUM CHLORIDE 0.9% FLUSH
3.0000 mL | Freq: Two times a day (BID) | INTRAVENOUS | Status: DC
  Administered 2015-10-25 – 2015-10-28 (×7): 3 mL via INTRAVENOUS

## 2015-10-25 MED ORDER — BUTAMBEN-TETRACAINE-BENZOCAINE 2-2-14 % EX AERO
INHALATION_SPRAY | CUTANEOUS | Status: DC | PRN
  Administered 2015-10-25: 2 via TOPICAL

## 2015-10-25 MED ORDER — LEVOTHYROXINE SODIUM 50 MCG PO TABS
50.0000 ug | ORAL_TABLET | Freq: Every day | ORAL | Status: DC
  Administered 2015-10-26 – 2015-10-28 (×3): 50 ug via ORAL
  Filled 2015-10-25 (×3): qty 1

## 2015-10-25 MED ORDER — MIDAZOLAM HCL 5 MG/5ML IJ SOLN
INTRAMUSCULAR | Status: DC | PRN
  Administered 2015-10-25: 2 mg via INTRAVENOUS

## 2015-10-25 NOTE — Interval H&P Note (Signed)
History and Physical Interval Note:  10/25/2015 11:21 AM  Monica Morgan  has presented today for surgery, with the diagnosis of AFIB  The various methods of treatment have been discussed with the patient and family. After consideration of risks, benefits and other options for treatment, the patient has consented to  Procedure(s): TRANSESOPHAGEAL ECHOCARDIOGRAM (TEE) (N/A) CARDIOVERSION (N/A) as a surgical intervention .  The patient's history has been reviewed, patient examined, no change in status, stable for surgery.  I have reviewed the patient's chart and labs.  Questions were answered to the patient's satisfaction.     Fransico Him

## 2015-10-25 NOTE — Discharge Instructions (Addendum)
You have an appointment set up with the [facility].  Multiple studies have shown that being followed by a dedicated atrial fibrillation clinic in addition to the standard care you receive from your other physicians improves health. We believe that enrollment in the atrial fibrillation clinic will allow [place] to better care for you.   The phone number to the [facility] is [phone]. The clinic is staffed Monday through Friday from 8:30am to 5pm.  Parking Directions: The clinic is located in the Heart and Vascular Building connected to [facility]. 1)From [address] turn on to [address] and go to the 3rd entrance  (Heart and Vascular entrance) on the right. 2)Look to the right for Heart &Vascular Parking Garage. 3)A code for the entrance is required please call the clinic to receive this.   4)Take the elevators to the 1st floor. Registration is in the room with the glass walls at the end of the hallway.  If you have any trouble parking or locating the clinic, please dont hesitate to call [phone].    Information on my medicine - ELIQUIS (apixaban)  This medication education was reviewed with me or my healthcare representative as part of my discharge preparation.  The pharmacist that spoke with me during my hospital stay was:  Kai Levins, PharmD  Why was Eliquis prescribed for you? Eliquis was prescribed for you to reduce the risk of a blood clot forming that can cause a stroke if you have a medical condition called atrial fibrillation (a type of irregular heartbeat).  What do You need to know about Eliquis ? Take your Eliquis TWICE DAILY - one tablet in the morning and one tablet in the evening with or without food. If you have difficulty swallowing the tablet whole please discuss with your pharmacist how to take the medication safely.  Take Eliquis exactly as prescribed by your doctor and DO NOT stop taking Eliquis without  talking to the doctor who prescribed the medication.  Stopping may increase your risk of developing a stroke.  Refill your prescription before you run out.  After discharge, you should have regular check-up appointments with your healthcare provider that is prescribing your Eliquis.  In the future your dose may need to be changed if your kidney function or weight changes by a significant amount or as you get older.  What do you do if you miss a dose? If you miss a dose, take it as soon as you remember on the same day and resume taking twice daily.  Do not take more than one dose of ELIQUIS at the same time to make up a missed dose.  Important Safety Information A possible side effect of Eliquis is bleeding. You should call your healthcare provider right away if you experience any of the following: ? Bleeding from an injury or your nose that does not stop. ? Unusual colored urine (red or dark brown) or unusual colored stools (red or black). ? Unusual bruising for unknown reasons. ? A serious fall or if you hit your head (even if there is no bleeding).  Some medicines may interact with Eliquis and might increase your risk of bleeding or clotting while on Eliquis. To help avoid this, consult your healthcare provider or pharmacist prior to using any new prescription or non-prescription medications, including herbals, vitamins, non-steroidal anti-inflammatory drugs (NSAIDs) and supplements.  This website has more information on Eliquis (apixaban): http://www.eliquis.com/eliquis/home

## 2015-10-25 NOTE — Anesthesia Postprocedure Evaluation (Signed)
Anesthesia Post Note  Patient: Monica Morgan  Procedure(s) Performed: Procedure(s) (LRB): TRANSESOPHAGEAL ECHOCARDIOGRAM (TEE) (N/A) CARDIOVERSION (N/A)  Patient location during evaluation: Endoscopy Anesthesia Type: MAC Level of consciousness: awake and alert Pain management: pain level controlled Vital Signs Assessment: post-procedure vital signs reviewed and stable Respiratory status: spontaneous breathing, nonlabored ventilation, respiratory function stable and patient connected to nasal cannula oxygen Cardiovascular status: stable and blood pressure returned to baseline Anesthetic complications: no    Last Vitals:  Filed Vitals:   10/25/15 1106 10/25/15 1154  BP:  105/67  Pulse:  63  Temp: 36.7 C 36.5 C  Resp:  17    Last Pain: There were no vitals filed for this visit.               Montez Hageman

## 2015-10-25 NOTE — Progress Notes (Addendum)
Pharmacy Review for Dofetilide (Tikosyn) Initiation  Admit Complaint: 55 y.o. female admitted 10/25/2015 with atrial fibrillation to be initiated on dofetilide.   Assessment:  Patient Exclusion Criteria: If any screening criteria checked as "Yes", then  patient  should NOT receive dofetilide until criteria item is corrected. If "Yes" please indicate correction plan.  YES  NO Patient  Exclusion Criteria Correction Plan  []  [x]  Baseline QTc interval is greater than or equal to 440 msec. IF above YES box checked dofetilide contraindicated unless patient has ICD; then may proceed if QTc 500-550 msec or with known ventricular conduction abnormalities may proceed with QTc 550-600 msec. QTc = 0.37   []  [x]  Magnesium level is less than 1.8 mEq/l : Last magnesium:  Lab Results  Component Value Date   MG 2.4 10/25/2015         [x]  []  Potassium level is less than 4 mEq/l : Last potassium:  Lab Results  Component Value Date   K 3.9 10/25/2015       KCL 20 meq PO given, repeat BMET at 19:00  []  [x]  Patient is known or suspected to have a digoxin level greater than 2 ng/ml: No results found for: DIGOXIN    []  [x]  Creatinine clearance less than 20 ml/min (calculated using Cockcroft-Gault, actual body weight and serum creatinine): Estimated Creatinine Clearance: 81.1 mL/min (by C-G formula based on Cr of 0.87).    []  [x]  Patient has received drugs known to prolong the QT intervals within the last 48 hours (phenothiazines, tricyclics or tetracyclic antidepressants, erythromycin, H-1 antihistamines, cisapride, fluoroquinolones, azithromycin). Drugs not listed above may have an, as yet, undetected potential to prolong the QT interval, updated information on QT prolonging agents is available at this website:QT prolonging agents   []  [x]  Patient received a dose of hydrochlorothiazide (Oretic) alone or in any combination including triamterene (Dyazide, Maxzide) in the last 48 hours.   []  [x]  Patient  received a medication known to increase dofetilide plasma concentrations prior to initial dofetilide dose:  . Trimethoprim (Primsol, Proloprim) in the last 36 hours . Verapamil (Calan, Verelan) in the last 36 hours or a sustained release dose in the last 72 hours . Megestrol (Megace) in the last 5 days  . Cimetidine (Tagamet) in the last 6 hours . Ketoconazole (Nizoral) in the last 24 hours . Itraconazole (Sporanox) in the last 48 hours  . Prochlorperazine (Compazine) in the last 36 hours    []  [x]  Patient is known to have a history of torsades de pointes; congenital or acquired long QT syndromes.   []  [x]  Patient has received a Class 1 antiarrhythmic with less than 2 half-lives since last dose. (Disopyramide, Quinidine, Procainamide, Lidocaine, Mexiletine, Flecainide, Propafenone)   []  [x]  Patient has received amiodarone therapy in the past 3 months or amiodarone level is greater than 0.3 ng/ml. Was on Multaq with last dose 10/19/15   Patient has been appropriately anticoagulated with apixaban.   Ordering provider was confirmed at [email or web address] if they are not listed on the [place] Prescribers list.  Goal of Therapy: Follow renal function, electrolytes, potential drug interactions, and dose adjustment. Provide education and 1 week supply at discharge.  Plan:  [x]   Physician selected initial dose within range recommended for patients level of renal function - will monitor for response.  []   Physician selected initial dose outside of range recommended for patients level of renal function - will discuss if the dose should be altered at this time.   [employer] One  Calculated CrCl  Dose q12h  [x]  > 60 ml/min 500 mcg  []  40-60 ml/min 250 mcg  []  20-40 ml/min 125 mcg   2. Follow up QTc after the first 5 doses, renal function, electrolytes (K & Mg) daily x 3     days, dose adjustment, success of initiation and facilitate 1 week discharge supply as     clinically indicated.  3.  Initiate Tikosyn education video (Call [phone] and ask for video # [phone]).  4. Place Enrollment Form on the chart for discharge supply of dofetilide.   Renold Genta, PharmD, BCPS Clinical Pharmacist Pager: [phone] 10/25/2015 6:14 PM   Addendum: Repeat K is 4. Ok to give [employer]. Will f/u with am labs.  Renold Genta, PharmD, BCPS Clinical Pharmacist Pager: [phone] 10/25/2015 8:19 PM

## 2015-10-25 NOTE — Plan of Care (Signed)
Problem: Cardiac: Goal: Ability to achieve and maintain adequate cardiopulmonary perfusion will improve Outcome: Progressing Pt successfully cardioverted, currently maintaining NSR. Pt given "off the beat" booklet, package on tikosyn and and educated on new Coreg and Lisinopril. Pt has no further questions at this time. Will continue to monitor. Awaiting echo, mag and potassium. Obtaining 12-lead.

## 2015-10-25 NOTE — Anesthesia Preprocedure Evaluation (Addendum)
Anesthesia Evaluation  Patient identified by MRN, date of birth, ID band Patient awake    Reviewed: Allergy & Precautions, NPO status , Unable to perform ROS - Chart review only  Airway Mallampati: II  TM Distance: >3 FB     Dental  (+) Teeth Intact, Dental Advisory Given   Pulmonary asthma ,    breath sounds clear to auscultation       Cardiovascular negative cardio ROS   Rhythm:Irregular     Neuro/Psych negative neurological ROS  negative psych ROS   GI/Hepatic negative GI ROS, Neg liver ROS,   Endo/Other  Hypothyroidism   Renal/GU negative Renal ROS  negative genitourinary   Musculoskeletal negative musculoskeletal ROS (+)   Abdominal (+)  Abdomen: soft. Bowel sounds: normal.  Peds negative pediatric ROS (+)  Hematology negative hematology ROS (+)   Anesthesia Other Findings   Reproductive/Obstetrics negative OB ROS                           Anesthesia Physical Anesthesia Plan  ASA: III  Anesthesia Plan: MAC   Post-op Pain Management:    Induction: Intravenous  Airway Management Planned: Simple Face Mask, Nasal Cannula and Natural Airway  Additional Equipment:   Intra-op Plan:   Post-operative Plan:   Informed Consent: I have reviewed the patients History and Physical, chart, labs and discussed the procedure including the risks, benefits and alternatives for the proposed anesthesia with the patient or authorized representative who has indicated his/her understanding and acceptance.   Dental advisory given  Plan Discussed with: CRNA  Anesthesia Plan Comments:         Anesthesia Quick Evaluation

## 2015-10-25 NOTE — Transfer of Care (Signed)
Immediate Anesthesia Transfer of Care Note  Patient: Monica Morgan  Procedure(s) Performed: Procedure(s): TRANSESOPHAGEAL ECHOCARDIOGRAM (TEE) (N/A) CARDIOVERSION (N/A)  Patient Location: PACU  Anesthesia Type:MAC  Level of Consciousness: awake, alert  and sedated  Airway & Oxygen Therapy: Patient connected to nasal cannula oxygen  Post-op Assessment: Report given to RN  Post vital signs: stable  Last Vitals:  Filed Vitals:   10/25/15 1058 10/25/15 1106  BP: 158/101   Temp:  36.7 C  Resp: 12     Last Pain: There were no vitals filed for this visit.       Complications: No apparent anesthesia complications

## 2015-10-25 NOTE — Progress Notes (Signed)
  Echocardiogram Echocardiogram Transesophageal has been performed.  Monica Morgan 10/25/2015, 12:50 PM

## 2015-10-25 NOTE — CV Procedure (Signed)
    PROCEDURE NOTE:  Procedure:  Transesophageal echocardiogram Operator:  Fransico Him, MD Indications:  Atrial fibrillation Complications: None  During this procedure the patient is administered a total of Propofol 233 mg to achieve and maintain deep sedation.  The patient's heart rate, blood pressure, and oxygen saturation are monitored continuously during the procedure.   Results: Mildly dilated LV size and moderate reduced LV function with EF 30-35% Normal RV size and function Mildly dilated RA Moderately to severely dilated LA with no evidence of LA thrombus.  Normal LA appendage with no evidence of thrombus or spontaneous echo contrast.  Emptying velocity was 40.   Normal TV with mild TR Normal PV with mild PR Normal MV with mild MR Mildly thickened AV leaflets.  AV is trileaflet with no AI.  Normal interatrial septum with no evidence of shunt by colorflow dopper  Normal thoracic and ascending aorta.  The patient tolerated the procedure well and went on to DCCV.  Marland Kitchen     Electrical Cardioversion Procedure Note Kaedance Erps [id number] 1961/02/20  Procedure: Electrical Cardioversion Indications:  Atrial Fibrillation  Time Out: Verified patient identification, verified procedure,medications/allergies/relevent history reviewed, required imaging and test results available.  Performed  Procedure Details  The patient was NPO after midnight. Anesthesia was administered at the beside  by Dr.Carigan with 20mg  of propofol.  Cardioversion was done with synchronized biphasic defibrillation with AP pads with 150watts.  The patient converted to normal sinus rhythm. The patient tolerated the procedure well   IMPRESSION:  Successful cardioversion of atrial fibrillation    Grabiela Wohlford 10/25/2015, 11:21 AM    Signed: Fransico Him, MD [place]

## 2015-10-25 NOTE — H&P (View-Only) (Signed)
Expand All Collapse All      Cardiology Office Note    Date: 10/20/2015   ID: Monica Morgan, DOB [birth year], MRN [id number]  PCP: Alesia Richards, MD Cardiologist: Dr. Johnsie Cancel   CC: Aflutter and SOB.  History of Present Illness:  Monica Morgan is a 55 y.o. female with a history of PAF on Multaq, HLD, psoriasis, hypothyroidism who presents for evaluation of worsening PAF and SOB.   She relocated from [place] a few years ago. Per Dr. Kyla Morgan notes, she has no history of structural heart disease and has never required DCCV. ETT in 2013 was normal. She was previously on Rhythmol but gained a significant amount of weight and had no relief of AF, so was changed to Multaq. The patient eventually titrated the dose down herself. She found that a combination of 60lb weight loss + Multaq PRN helped to significantly reduce her afib burden. She has not been on anticoagulation in the past due to low CHADS score (prior to use of CHADSVASC). No recent echo available. CBC/BMET/Mg/TSH/A1C/LFTs in 12/2014 were normal. PCP following lipids. She denies h/o HTN, CVA, TIA or known vascular disease.  She was seen by Monica Morgan in 02/2015 for follow up. She noticed worsening aifb after a 10 lb weight and was taking 1/2 tablet Multaq daily PRN. 2D ECHO was ordered which showed normal LV function, severe LAE, mild MR. It was suggested that she take her Multaq as prescribed regularly at therapeutic dose (400mg  BID) vs PRN.   Today she presents to clinic for worsening PAF and SOB. She had what she thinks was a bronchitis with wheezing and coughing for 1 week. She is in the middle of a move and she thinks the dust has made everything worse. She was recently placed on steroids and cough syrup. She has been feeling terrible with fatigue and weakness all week. She thinks she has been out of rhythm for at least 1 week. She has not had any LE edema or PND, but was sleeping in a recliner for a couple days  this week. No chest pain. No dizziness or syncope. No blood in her stool or urine. She has been taking her Mutaq as prescribed 400mg  BID for at least the last 2 months.     Past Medical History  Diagnosis Date  . Hyperlipidemia   . Melanoma ([place])     leg  . Rosacea   . Psoriasis   . Vitamin D deficiency   . Hypothyroidism     Past Surgical History  Procedure Laterality Date  . Skin cancer excision      leg  . Cesarean section with bilateral tubal ligation    . Cesarean section    . Cervical ablation      Current Medications: Outpatient Prescriptions Prior to Visit  Medication Sig Dispense Refill  . aspirin 81 MG tablet Take 81 mg by mouth daily.    Marland Kitchen augmented betamethasone dipropionate (DIPROLENE-AF) 0.05 % cream APPLY TWICE DAILY FOR PSORIASIS. DO NOT APPLY TO FACE 50 g 1  . betamethasone dipropionate (DIPROLENE) 0.05 % cream Apply 1 application topically as needed.    . Calcium 600-400 MG-UNIT CHEW Chew by mouth.    . Cholecalciferol (VITAMIN D PO) Take 5,000 Units by mouth daily.    . Coenzyme Q10 (CO Q10) 100 MG CAPS Take 1 capsule by mouth daily.    Marland Kitchen dronedarone (MULTAQ) 400 MG tablet Take 1 tablet (400 mg total) by mouth 2 (two) times daily with a meal.  180 tablet 1  . ibuprofen (ADVIL,MOTRIN) 800 MG tablet Take 1 tablet (800 mg total) by mouth every 6 (six) hours as needed. 60 tablet 2  . levothyroxine (SYNTHROID, LEVOTHROID) 50 MCG tablet TAKE 1 TABLET (50 MCG TOTAL) BY MOUTH DAILY. 90 tablet 1  . Magnesium 250 MG TABS Take 2 tablets by mouth daily.    . montelukast (SINGULAIR) 10 MG tablet TAKE 1 TABLET BY MOUTH EVERY DAY 90 tablet 1  . NONFORMULARY OR COMPOUNDED ITEM 3 (three) times a week. Estriol/testosterone 0.25-0.25 suppository compound    . NONFORMULARY OR COMPOUNDED ITEM Place onto the skin daily. Biest 8/2 HRT compound    . OVER THE  COUNTER MEDICATION Multivitamin and diet supplement    . Probiotic Product (PROBIOTIC DAILY PO) Take 1 tablet by mouth daily.    Marland Kitchen PROGESTERONE, VAGINAL, 4 % GEL Place onto the skin. Days 1-25 of each month    . Turmeric 500 MG CAPS Take 1 tablet 2 x day    . cyclobenzaprine (FLEXERIL) 10 MG tablet Take 1 tablet (10 mg total) by mouth every 8 (eight) hours as needed for muscle spasms. 30 tablet 1  . OVER THE COUNTER MEDICATION Tumeric 2 capsules daily     No facility-administered medications prior to visit.     Allergies: Lipitor; Rythmol; and Sulfa drugs cross reactors   Social History   Social History  . Marital Status: Divorced    Spouse Name: N/A  . Number of Children: N/A  . Years of Education: N/A   Social History Main Topics  . Smoking status: Never Smoker   . Smokeless tobacco: Never Used  . Alcohol Use: Yes     Comment: socially,weekends at beach  . Drug Use: No  . Sexual Activity: Not Asked   Other Topics Concern  . None   Social History Narrative     Family History: The patient's family history includes Diabetes in her father; Heart attack in her brother and mother; Heart disease in her father and mother; Hypertension in her father and mother. There is no history of Colon cancer or Stroke.   ROS:  Please see the history of present illness.  ROS All other systems reviewed and are negative.   PHYSICAL EXAM:   VS: BP 128/82 mmHg  Pulse 150  Ht 5' 5.25" (1.657 m)  Wt 212 lb 12.8 oz (96.525 kg)  BMI 35.16 kg/m2  GEN: Well nourished, well developed, in no acute distress  HEENT: normal  Neck: no JVD, carotid bruits, or masses Cardiac: irreg irreg tachy; no murmurs, rubs, or gallops,no edema  Respiratory: clear to auscultation bilaterally, normal work of breathing GI: soft, nontender, nondistended, + BS MS: no deformity or atrophy  Skin: warm and dry, no  rash Neuro: Alert and Oriented x 3, Strength and sensation are intact Psych: euthymic mood, full affect  Wt Readings from Last 3 Encounters:  10/20/15 212 lb 12.8 oz (96.525 kg)  06/23/15 202 lb (91.627 kg)  03/07/15 210 lb (95.255 kg)      Studies/Labs Reviewed:   EKG: EKG is ordered today. The ekg ordered today demonstrates atrial flutter with RVR HR 151 2:1 AV conduction  Recent Labs: 12/22/2014: Magnesium 2.3; TSH 1.540 06/23/2015: ALT 11; BUN 14; Creat 0.81; Hemoglobin 13.2; Platelets 296; Potassium 4.5; Sodium 139   Lipid Panel  Labs (Brief)       Component Value Date/Time   CHOL 195 06/23/2015 0921   TRIG 113 06/23/2015 0921   HDL 46 06/23/2015  0921   CHOLHDL 4.2 06/23/2015 0921   VLDL 23 06/23/2015 0921   LDLCALC 126 06/23/2015 0921      Additional studies/ records that were reviewed today include:  2D ECHO: 03/16/2015 LV EF: 55% - 60% Study Conclusions - Left ventricle: The cavity size was normal. Systolic function was normal. The estimated ejection fraction was in the range of 55% to 60%. Wall motion was normal; there were no regional wall motion abnormalities. - Mitral valve: There was mild regurgitation. - Left atrium: The atrium was severely dilated. - Atrial septum: No defect or patent foramen ovale was identified.    ASSESSMENT & PLAN:   Aflutter with RVR- 2:1 conduction. HR 151. She has been on Multaq 400mg  BID x 2 months (beofre she was taking PRN). She has not been on anticoagulation as CHADSVASC is only 1 (she confirms she does not have hypertension). She has very symptomatic with SOB and may have early CHF. Plan will be to admit her and start her on Eliquis 5mg  BID. SHe was given one dose of Eliquis 5mg  this AM in the office. Will start IV dilt when she gets to hospital for rate control and hold her Multaq. Plan will be for TEE/DCCV if she does not convert after at least 5 doses of Eliquis. I will also  give her one dose of IV lasix 20mg  when she arrives at the hospital.   HLD- followed by PCP. Recent lipid panel with LDL 126.  Hypothyroidism - TSH normal by labs 12/2014. Will recheck when admitted.   Obesity - she has gained more weight recently and knows this contributes to her afib burden.

## 2015-10-25 NOTE — Progress Notes (Signed)
Dr. Radford Pax is going to discuss with patient about the benefits of being admitted and starting IV tikosyn instead of going home on po multaq. A/O, VSS. Son at bedside along side patient. Richardean Sale, RN

## 2015-10-25 NOTE — Progress Notes (Signed)
k 3.9, mag 2.4, EP made aware

## 2015-10-25 NOTE — Progress Notes (Signed)
QTC 470, EP, [place] made aware. Awaiting repeat potassium lab. supplemented with 20 of K.

## 2015-10-25 NOTE — H&P (Signed)
-   Cardiology Office Note    Date: 10/25/2015   ID: Monica Morgan, DOB 05-07-1960, MRN [id number]  PCP: Alesia Richards, MD Cardiologist: Dr. Johnsie Cancel   CC: Aflutter and SOB.  History of Present Illness:  Monica Morgan is a 55 y.o. female with a history of PAF on Multaq, HLD, psoriasis, hypothyroidism who presents for evaluation of worsening PAF and SOB.   She relocated from [place] a few years ago. Per Dr. [name] notes, she has no history of structural heart disease and has never required DCCV. ETT in 2013 was normal. She was previously on Rhythmol but gained a significant amount of weight and had no relief of AF, so was changed to Multaq. The patient eventually titrated the dose down herself. She found that a combination of 60lb weight loss + Multaq PRN helped to significantly reduce her afib burden. She has not been on anticoagulation in the past due to low CHADS score (prior to use of CHADSVASC). No recent echo available. CBC/BMET/Mg/TSH/A1C/LFTs in 12/2014 were normal. PCP following lipids. She denies h/o HTN, CVA, TIA or known vascular disease.  She was seen by Melina Copa in 02/2015 for follow up. She noticed worsening aifb after a 10 lb weight and was taking 1/2 tablet Multaq daily PRN. 2D ECHO was ordered which showed normal LV function, severe LAE, mild MR. It was suggested that she take her Multaq as prescribed regularly at therapeutic dose (400mg  BID) vs PRN.   She presented to clinic for worsening PAF and SOB on 7/6. She had what she thinks was a bronchitis with wheezing and coughing for 1 week. She is in the middle of a move and she thinks the dust has made everything worse. She was recently placed on steroids and cough syrup. She has been feeling terrible with fatigue and weakness all week. She thought she had been out of rhythm for at least 1 week. She had not had any LE edema or PND, but was sleeping in a recliner for a couple days this week. No  chest pain. No dizziness or syncope. No blood in her stool or urine. She has been taking her Mutaq as prescribed 400mg  BID for at least the last 2 months. She was found to be atrial fibrillation with RVR and was admitted to [facility] and started on IV Cardizem gtt for rate control.  She was started on Eliquis for anticoagulation.  On Saturday her HR was controlled and she was discharged home and plan for TEE/DCCV as an outpt.  Today she presented to Endoscopy for TEE/DCCV.  TEE showed no evidence of thrombus in LA or LAA but LA was markedly enlarged and LVF appeared moderately depressed at 30-35%.  She underwent successful TEE/DCCV to NSR.  Due to marked enlargement of her LA it was felt that she would probably not maintain NSR on Multaq and is now being admitted for Tikosyn load.      Past Medical History  Diagnosis Date  . Hyperlipidemia   . Melanoma ([place])   . Rosacea   . Psoriasis   . Vitamin D deficiency    Paroxysmal atrial fibrillation - failed Multaq.  CHADS2VASC score is 1.     . Hypothyroidism     Past Surgical History  Procedure Laterality Date  . Skin cancer excision    . Cesarean section with bilateral tubal ligation    . Cesarean section     TEE/DCCV  10/25/2015  . Cervical ablation      Current Medications: Outpatient  Prescriptions Prior to Visit  Medication Sig Dispense Refill  . aspirin 81 MG tablet Take 81 mg by mouth daily.    Marland Kitchen augmented betamethasone dipropionate (DIPROLENE-AF) 0.05 % cream APPLY TWICE DAILY FOR PSORIASIS. DO NOT APPLY TO FACE 50 g 1  . betamethasone dipropionate (DIPROLENE) 0.05 % cream Apply 1 application topically as needed.    . Calcium 600-400 MG-UNIT CHEW Chew by mouth.    . Cholecalciferol (VITAMIN D PO) Take 5,000 Units by mouth daily.    . Coenzyme Q10 (CO Q10) 100 MG CAPS Take 1 capsule by mouth  daily.    Marland Kitchen dronedarone (MULTAQ) 400 MG tablet Take 1 tablet (400 mg total) by mouth 2 (two) times daily with a meal. 180 tablet 1  . ibuprofen (ADVIL,MOTRIN) 800 MG tablet Take 1 tablet (800 mg total) by mouth every 6 (six) hours as needed. 60 tablet 2  . levothyroxine (SYNTHROID, LEVOTHROID) 50 MCG tablet TAKE 1 TABLET (50 MCG TOTAL) BY MOUTH DAILY. 90 tablet 1  . Magnesium 250 MG TABS Take 2 tablets by mouth daily.    . montelukast (SINGULAIR) 10 MG tablet TAKE 1 TABLET BY MOUTH EVERY DAY 90 tablet 1  . NONFORMULARY OR COMPOUNDED ITEM 3 (three) times a week. Estriol/testosterone 0.25-0.25 suppository compound    . NONFORMULARY OR COMPOUNDED ITEM Place onto the skin daily. Biest 8/2 HRT compound    . OVER THE COUNTER MEDICATION Multivitamin and diet supplement    . Probiotic Product (PROBIOTIC DAILY PO) Take 1 tablet by mouth daily.    Marland Kitchen PROGESTERONE, VAGINAL, 4 % GEL Place onto the skin. Days 1-25 of each month    . Turmeric 500 MG CAPS Take 1 tablet 2 x day    . cyclobenzaprine (FLEXERIL) 10 MG tablet Take 1 tablet (10 mg total) by mouth every 8 (eight) hours as needed for muscle spasms. 30 tablet 1  . OVER THE COUNTER MEDICATION Tumeric 2 capsules daily     No facility-administered medications prior to visit.    Allergies: Lipitor; Rythmol; and Sulfa drugs cross reactors   Social History   Social History  . Marital Status: Divorced    Spouse Name: N/A  . Number of Children: N/A  . Years of Education: N/A   Social History Main Topics  . Smoking status: Never Smoker   . Smokeless tobacco: Never Used  . Alcohol Use: Yes     Comment: socially,weekends at beach  . Drug Use: No  . Sexual Activity: Not Asked   Other Topics Concern  . None   Social  History Narrative    Family History: The patient's family history includes Diabetes in her father; Heart attack in her brother and mother; Heart disease in her father and mother; Hypertension in her father and mother. There is no history of Colon cancer or Stroke.   ROS:  Please see the history of present illness.  ROS All other systems reviewed and are negative.   PHYSICAL EXAM:   VS: BP 114/92 mmHg  Pulse 62  Ht 5' 5.25" (1.657 m)  Wt 212 lb 12.8 oz (96.525 kg)  BMI 35.16 kg/m2  GEN: Well nourished, well developed, in no acute distress  HEENT: normal  Neck: no JVD, carotid bruits, or masses Cardiac:RRR; no murmurs, rubs, or gallops,no edema  Respiratory: clear to auscultation bilaterally, normal work of breathing GI: soft, nontender, nondistended, + BS MS: no deformity or atrophy  Skin: warm and dry, no rash Neuro: Alert and Oriented x 3, Strength  and sensation are intact Psych: euthymic mood, full affect  Wt Readings from Last 3 Encounters:  10/20/15 212 lb 12.8 oz (96.525 kg)  06/23/15 202 lb (91.627 kg)  03/07/15 210 lb (95.255 kg)     Studies/Labs Reviewed:   EKG: EKG is ordered today. The ekg ordered today demonstrates atrial flutter with RVR HR 151 2:1 AV conduction  Recent Labs: 12/22/2014: Magnesium 2.3; TSH 1.540 06/23/2015: ALT 11; BUN 14; Creat 0.81; Hemoglobin 13.2; Platelets 296; Potassium 4.5; Sodium 139   Lipid Panel  Labs (Brief)      Component Value Date/Time   CHOL 195 06/23/2015 0921   TRIG 113 06/23/2015 0921   HDL 46 06/23/2015 0921   CHOLHDL 4.2 06/23/2015 0921   VLDL 23 06/23/2015 0921   LDLCALC 126 06/23/2015 0921      Additional studies/ records that were reviewed today include:  2D ECHO: 03/16/2015 LV EF: 55% - 60% Study Conclusions - Left ventricle: The cavity size was normal. Systolic function was normal. The estimated  ejection fraction was in the range of 55% to 60%. Wall motion was normal; there were no regional wall motion abnormalities. - Mitral valve: There was mild regurgitation. - Left atrium: The atrium was severely dilated. - Atrial septum: No defect or patent foramen ovale was identified.    ASSESSMENT & PLAN:   Aflutter with RVR- 2:1 conduction. HR 130. She has been on Multaq 400mg  BID x 2 months (beofre she was taking PRN and therefore has failed this therayp.She has been on Eliquis since last Thursday and had successful TEE/DCCV to NSR today.  Her LA is markedly enlarged and unlikely to hold NSR without AAD therapy.  She has failed Multaq.  She has moderate LV dysfunction so not a candidate for Flecainide.  Would like to avoid therapy with Amio given her young age.  Will admit to tele bed for Tikosyn load.  Will stop CCB due to LV dysfunction and start coreg 6.26mg  daily.    HLD- followed by PCP. Recent lipid panel with LDL 126.  Hypothyroidism - TSH normal by labs  Obesity - she has gained more weight recently and knows this contributes to her afib burden. She was also snoring during the TEE and suspect she has OSA.  Will need to arrange sleep study as an outpt.   Dilated cardiomyopathy with moderately reduced LVF most likely secondary to tachycardia induced CM.  Hopefully will resolve with maintenance of normal rhythm.  As above, will stop CCB and start Coreg 6.25mg  BID and start lisinopril 5mg  daily.  Repeat echo in 2 months and if LVF has not normalized then would get outpt nuclear stress test to rule out ischemia.          Signed: Fransico Him, MD [facility] 10/25/2015

## 2015-10-26 ENCOUNTER — Inpatient Hospital Stay (HOSPITAL_COMMUNITY): Payer: 59

## 2015-10-26 DIAGNOSIS — I255 Ischemic cardiomyopathy: Secondary | ICD-10-CM

## 2015-10-26 LAB — ECHOCARDIOGRAM COMPLETE
Height: 65 in
Weight: 3376 oz

## 2015-10-26 LAB — BASIC METABOLIC PANEL
Anion gap: 7 (ref 5–15)
BUN: 13 mg/dL (ref 6–20)
CO2: 25 mmol/L (ref 22–32)
Calcium: 9 mg/dL (ref 8.9–10.3)
Chloride: 108 mmol/L (ref 101–111)
Creatinine, Ser: 0.9 mg/dL (ref 0.44–1.00)
GFR calc Af Amer: >60 mL/min (ref >60)
GFR calc non Af Amer: >60 mL/min (ref >60)
Glucose, Bld: 104 mg/dL — ABNORMAL HIGH (ref 65–99)
Potassium: 4.6 mmol/L (ref 3.5–5.1)
Sodium: 140 mmol/L (ref 135–145)

## 2015-10-26 LAB — MAGNESIUM: Magnesium: 2.4 mg/dL (ref 1.7–2.4)

## 2015-10-26 MED ORDER — DOFETILIDE 250 MCG PO CAPS
250.0000 ug | ORAL_CAPSULE | Freq: Two times a day (BID) | ORAL | Status: DC
  Administered 2015-10-26 – 2015-10-28 (×5): 250 ug via ORAL
  Filled 2015-10-26 (×5): qty 1

## 2015-10-26 NOTE — Progress Notes (Signed)
Echocardiogram 2D Echocardiogram has been performed.  Monica Morgan 10/26/2015, 12:07 PM

## 2015-10-26 NOTE — Progress Notes (Signed)
Patient in sinus bradycardia with rate ~55 bpm; asymptomatic. Received 1st dose of tikosyn at 20:55. Follow-up EKG done approximately 1 hour late at 00:40. QTc calculated from lead II = 0.504 sec Baseline QTc was 0.44 sec prior to dose administration.  Patient is borderline for night-coverage MD contact regarding bradycardia and QTc changes.  Will observe for now and communicate findings with charge RN. Will also communicate to oncoming shift and will notify cardiology/electrophysiology PA of findings by text page as soon as they are available later this AM.  Continuing to monitor closely.

## 2015-10-26 NOTE — Progress Notes (Signed)
SUBJECTIVE: The patient is doing well today.  At this time, she denies chest pain, shortness of breath, or any new concerns.  Marland Kitchen apixaban  5 mg Oral BID  . carvedilol  6.25 mg Oral BID WC  . dofetilide  250 mcg Oral BID  . levothyroxine  50 mcg Oral QAC breakfast  . lisinopril  5 mg Oral Daily  . magnesium gluconate  500 mg Oral Daily  . montelukast  10 mg Oral Daily  . naphazoline-pheniramine  1 drop Both Eyes q morning - 10a  . sodium chloride flush  3 mL Intravenous Q12H      OBJECTIVE: Physical Exam: Filed Vitals:   10/25/15 1400 10/25/15 1409 10/25/15 2004 10/26/15 0458  BP: 127/99 117/77 114/61 105/52  Pulse:   64 55  Temp:   98.3 F (36.8 C) 98.1 F (36.7 C)  TempSrc:   Oral Oral  Resp:   16 16  Height:      Weight:    211 lb (95.709 kg)  SpO2:   100% 100%    Intake/Output Summary (Last 24 hours) at 10/26/15 0908 Last data filed at 10/26/15 0500  Gross per 24 hour  Intake    720 ml  Output      1 ml  Net    719 ml    Telemetry reveals sinus rhythm, one 5 beat NSVT  GEN- The patient is well appearing, alert and oriented x 3 today.   Head- normocephalic, atraumatic Eyes-  Sclera clear, conjunctiva pink Ears- hearing intact Oropharynx- clear Neck- supple, no JVP Lungs- Clear to ausculation bilaterally, normal work of breathing Heart- Regular rate and rhythm, no significant murmurs, no rubs or gallops GI- soft, NT, ND Extremities- no clubbing, cyanosis, or edema Skin- no rash or lesion Psych- euthymic mood, full affect Neuro- no gross deficits appreciated  LABS: Basic Metabolic Panel:  Recent Labs  10/25/15 1446 10/25/15 1908 10/26/15 0434  NA 138 139 140  K 3.9 4.0 4.6  CL 106 107 108  CO2 24 24 25   GLUCOSE 112* 116* 104*  BUN 12 15 13   CREATININE 0.87 1.12* 0.90  CALCIUM 8.9 8.8* 9.0  MG 2.4  --  2.4    ASSESSMENT AND PLAN:   1. PAFlutter/AFib history     CHA2DS2Vasc is now 2 with CM and is on Eliquis     S/p TEE/DCCV yesterday  with Dr. Radford Pax, decision was made to initiate Tikosyn having failed Rhythmol and Multaq      Remains in SR today  2. Tikosyn load     K+ 4.6     Mag 2.4     Creat 0.90     QTc is prolonged, EKGs reviewed with Dr. Curt Bears, Nathaneil Feagans decrease dose to 259mcg BID and follow closely  3. NICM     Exam is euvolemic     On BB/ACE  4. ? OSA     Latresha Yahr defer f/u to Dr. Radford Pax  5. Hypothyroid     TSH wnl  Tommye Standard, PA-C 10/26/2015 9:08 AM   I have seen and examined this patient with Tommye Standard.  Agree with above, note added to reflect my findings.  On exam, regular rhythm, no murmurs, lungs clear. Presented to the hospital with atrial fibrillation and is status post TEE and cardioversion. In sinus rhythm today. She says that she has had issues with heart failure symptoms and on her cheek EEG had an EF of 35%. She is currently on therapy for  heart failure. She is in sinus rhythm she is feeling much less short of breath. She was started on T dofetilide 500 g last night with a lengthening of her QTC. We'll decrease to 250 g today.    Linsi Humann M. Iasiah Ozment MD 10/26/2015 10:04 AM

## 2015-10-26 NOTE — Progress Notes (Signed)
Instructed from EP to pass on to administer 250 mcgTikosyn dose at 8pm this evening. Aware of prolonged QTC, believe its due to residual of initial 500 mcg dose from last night.

## 2015-10-26 NOTE — Plan of Care (Signed)
Problem: Cardiac: Goal: Ability to achieve and maintain adequate cardiopulmonary perfusion will improve Outcome: Progressing Pt qtc 534 after 2nd dose of tikosyn, dose reduced to 250. EP paged and made aware. Pt has no complaints at this time. Pt Sinus brady. CHF education complete, "living better with heart failure' booklet given. Echo complete awaiting results. Pt has no further questions.

## 2015-10-26 NOTE — Care Management Note (Addendum)
Case Management Note  Patient Details  Name: Monica Morgan MRN: [id number] Date of Birth: 11/23/1960  Subjective/Objective:     Pt presented for Tikosyn Load.                Action/Plan: Benefits check in process for medication. Once available CM will make pt aware. CM will assist with the 7 ay supply via [place]. Md please write Rx for 7 day supply to be obtained via the main pharmacy. Pt will need original Rx with refills. No further needs at this time.   Expected Discharge Date:                  Expected Discharge Plan:  Home/Self Care  In-House Referral:  NA  Discharge planning Services  CM Consult, Medication Assistance  Post Acute Care Choice:  NA Choice offered to:  NA  DME Arranged:  N/A DME Agency:  NA  HH Arranged:  NA HH Agency:  NA  Status of Service:  Completed, signed off  If discussed at [place] of [place] Meetings, dates discussed:    Additional Comments:1501 10-27-15 Jacqlyn Krauss, RN, BSN [phone] Co pay for Tikosyn will be 37.50. CM will make pt aware of cost. CM will check with Pharmacy to make sure medication is available. [facility] [address] [facility] [place] and they are not on the [employer]. [facility] [place] [facility]. Pt is aware and willing to get medication from this location. No further needs from CM at this time.   Bethena Roys, RN 10/26/2015, 4:19 PM

## 2015-10-27 LAB — BASIC METABOLIC PANEL
Anion gap: 8 (ref 5–15)
BUN: 13 mg/dL (ref 6–20)
CO2: 23 mmol/L (ref 22–32)
Calcium: 8.9 mg/dL (ref 8.9–10.3)
Chloride: 109 mmol/L (ref 101–111)
Creatinine, Ser: 0.8 mg/dL (ref 0.44–1.00)
GFR calc Af Amer: >60 mL/min (ref >60)
GFR calc non Af Amer: >60 mL/min (ref >60)
Glucose, Bld: 102 mg/dL — ABNORMAL HIGH (ref 65–99)
Potassium: 4.2 mmol/L (ref 3.5–5.1)
Sodium: 140 mmol/L (ref 135–145)

## 2015-10-27 LAB — MAGNESIUM: Magnesium: 2.2 mg/dL (ref 1.7–2.4)

## 2015-10-27 NOTE — Progress Notes (Signed)
SUBJECTIVE: The patient is doing well today.  At this time, she denies chest pain, shortness of breath, or any new concerns.  Marland Kitchen apixaban  5 mg Oral BID  . carvedilol  6.25 mg Oral BID WC  . dofetilide  250 mcg Oral BID  . levothyroxine  50 mcg Oral QAC breakfast  . lisinopril  5 mg Oral Daily  . magnesium gluconate  500 mg Oral Daily  . montelukast  10 mg Oral Daily  . naphazoline-pheniramine  1 drop Both Eyes q morning - 10a  . sodium chloride flush  3 mL Intravenous Q12H      OBJECTIVE: Physical Exam: Filed Vitals:   10/26/15 1024 10/26/15 1421 10/26/15 2022 10/27/15 0642  BP: 113/61 108/68 109/59 120/56  Pulse:  58 59 59  Temp:  98.5 F (36.9 C) 98.9 F (37.2 C) 98.6 F (37 C)  TempSrc:  Oral Oral Oral  Resp:  18 16 16   Height:      Weight:    209 lb 1.6 oz (94.847 kg)  SpO2:  100% 100% 95%    Intake/Output Summary (Last 24 hours) at 10/27/15 0834 Last data filed at 10/26/15 2030  Gross per 24 hour  Intake    840 ml  Output      0 ml  Net    840 ml    Telemetry reveals sinus rhythm, one 9 beat wide complex rhythm, slow irregular 70-100bpm, one brief PAT  GEN- The patient is well appearing, alert and oriented x 3 today.   Head- normocephalic, atraumatic Eyes-  Sclera clear, conjunctiva pink Ears- hearing intact Oropharynx- clear Neck- supple, no JVP Lungs- Clear to ausculation bilaterally, normal work of breathing Heart- Regular rate and rhythm, no significant murmurs, no rubs or gallops GI- soft, NT, ND Extremities- no clubbing, cyanosis, or edema Skin- no rash or lesion Psych- euthymic mood, full affect Neuro- no gross deficits appreciated  LABS: Basic Metabolic Panel:  Recent Labs  10/26/15 0434 10/27/15 0522  NA 140 140  K 4.6 4.2  CL 108 109  CO2 25 23  GLUCOSE 104* 102*  BUN 13 13  CREATININE 0.90 0.80  CALCIUM 9.0 8.9  MG 2.4 2.2    ASSESSMENT AND PLAN:   1. PAFlutter/AFib history     CHA2DS2Vasc is now 2 with CM and is on  Eliquis     S/p TEE/DCCV 10/25/15 with Dr. Radford Pax, decision was made to initiate Tikosyn having failed Rhythmol and Multaq      Remains in SR today  2. Tikosyn load     K+ 4.2     Mag 2.2     Creat 0.80     QTc is stable, reviewed with Dr. Curt Bears, Paras Kreider continue 275mcg BID and continue to follow closely     D/w case management yesterday, cost analysis is still pending to hear back from insurance company  3. NICM     Exam is euvolemic     On BB/ACE  4. ? OSA     Janille Draughon defer f/u to Dr. Radford Pax  5. Hypothyroid     TSH wnl  Tommye Standard, PA-C 10/27/2015 8:34 AM   I have seen and examined this patient with Tommye Standard.  Agree with above, note added to reflect my findings.  On exam, regular rhythm, no murmurs, lungs clear.  QTc improved on 250 mcg.  Had short run of wide complex rhythm, likely ventricular.  Was monomorphic and thus not likely due to Tikosyn.  Demica Zook continue at current dose.    Kore Madlock M. Cherly Erno MD 10/27/2015 12:15 PM

## 2015-10-28 LAB — BASIC METABOLIC PANEL
Anion gap: 6 (ref 5–15)
BUN: 12 mg/dL (ref 6–20)
CO2: 25 mmol/L (ref 22–32)
Calcium: 9 mg/dL (ref 8.9–10.3)
Chloride: 108 mmol/L (ref 101–111)
Creatinine, Ser: 0.91 mg/dL (ref 0.44–1.00)
GFR calc Af Amer: >60 mL/min (ref >60)
GFR calc non Af Amer: >60 mL/min (ref >60)
Glucose, Bld: 102 mg/dL — ABNORMAL HIGH (ref 65–99)
Potassium: 4.2 mmol/L (ref 3.5–5.1)
Sodium: 139 mmol/L (ref 135–145)

## 2015-10-28 LAB — MAGNESIUM: Magnesium: 2.2 mg/dL (ref 1.7–2.4)

## 2015-10-28 MED ORDER — LISINOPRIL 5 MG PO TABS: 5.0000 mg | ORAL_TABLET | Freq: Every day | ORAL | Status: DC

## 2015-10-28 MED ORDER — CARVEDILOL 6.25 MG PO TABS: 6.2500 mg | ORAL_TABLET | Freq: Two times a day (BID) | ORAL | Status: DC

## 2015-10-28 MED ORDER — DOFETILIDE 250 MCG PO CAPS: 250.0000 ug | ORAL_CAPSULE | Freq: Two times a day (BID) | ORAL | Status: DC

## 2015-10-28 NOTE — Discharge Summary (Signed)
ELECTROPHYSIOLOGY PROCEDURE DISCHARGE SUMMARY    Patient ID: Monica Morgan,  MRN: [id number], DOB/AGE: March 15, 1961 55 y.o.  Admit date: 10/25/2015 Discharge date: 10/28/2015  Primary Care Physician: Alesia Richards, MD Primary Cardiologist: Dr. Radford Pax Electrophysiologist: Dr. Curt Bears (new)  Primary Discharge Diagnosis:  1.  Paroxysmal atrial fibrillation       status post TEE/DCCV and Tikosyn loading this admission      CHA2DS2Vasc is at least 2, on Eliquis  Secondary Discharge Diagnosis:  1. HLD 2. Hypothyroidism 3. Psoriasis  Allergies  Allergen Reactions  . Lipitor [Atorvastatin] Other (See Comments)    "neck pain"  . Metoprolol Other (See Comments)    DIDN'T WORK FOR PATIENT AND GAINED WEIGHT MIGHT HAVE BEEN TAKEN WITH RYTHMOL  . Rythmol [Propafenone] Other (See Comments)    "weight gain"  . Sulfa Drugs Cross Reactors Other (See Comments)    "soreness all over"     Procedures This Admission:  1. TEE 10/25/15, Dr. Radford Pax 2. DCCV 10/25/15, Dr. Radford Pax 3.  Tikosyn loading   Brief HPI: Monica Morgan is a 55 y.o. female with a past medical history as noted above.  The patient was brought as out patient for TEE/DCCV on 10/25/15 by Dr. Radford Pax.  Given new DCM, felt to be secondary to her AF, AAD options further discussed by Dr. Radford Pax with the patient and it was decided to admit her for Tikosyn loading, the patient wished to proceed.  Dr. Radford Pax asked EP to manage the load process.  Hospital Course:  The patient underwent TEE/DCCV restoring SR and was then admitted and Tikosyn was initiated.  Renal function and electrolytes were followed during the hospitalization. TEE noted LVEF 30-35%, f/u TTE her LVEF 35-40%.  Her medicines adjusted accordingly to d/c her CCB and start BB/ACE tx.  Their QTc became prolonged on 574mcg dose and was decreased to 22mcg BID and remained stable at this dose.  They were monitored until discharge on telemetry which demonstrated SR.  On  the day of discharge, they were examined by Dr Curt Bears who considered them stable for discharge to home.  Follow-up has been arranged with the AFib clinic in 1 week for EKG and labs and with Dr Curt Bears in 4 weeks. Follow up with Dr. Radford Pax Devynn Scheff be planned after her visit with Dr. Curt Bears in 1 month.  Physical Exam: Filed Vitals:   10/27/15 0642 10/27/15 1455 10/27/15 2045 10/28/15 0631  BP: 120/56 116/57 126/66 129/55  Pulse: 59 57 56 53  Temp: 98.6 F (37 C) 97.8 F (36.6 C) 97.4 F (36.3 C) 98.1 F (36.7 C)  TempSrc: Oral Oral Oral Oral  Resp: 16  16 16   Height:      Weight: 209 lb 1.6 oz (94.847 kg)   208 lb 12.8 oz (94.711 kg)  SpO2: 95% 99% 100% 100%    GEN- The patient is well appearing, alert and oriented x 3 today.   HEENT: normocephalic, atraumatic; sclera clear, conjunctiva pink; hearing intact; oropharynx clear; neck supple, no JVP Lymph- no cervical lymphadenopathy Lungs- Clear to ausculation bilaterally, normal work of breathing.  No wheezes, rales, rhonchi Heart- Regular rate and rhythm, no murmurs, rubs or gallops, PMI not laterally displaced GI- soft, non-tender, non-distended Extremities- no clubbing, cyanosis, or edema MS- no significant deformity or atrophy Skin- warm and dry, no rash or lesion Psych- euthymic mood, full affect Neuro- strength and sensation are intact   Labs:   Lab Results  Component Value Date   WBC 14.0* 10/20/2015  HGB 12.9 10/20/2015   HCT 40.7 10/20/2015   MCV 88.7 10/20/2015   PLT 410* 10/20/2015     Recent Labs Lab 10/28/15 0503  NA 139  K 4.2  CL 108  CO2 25  BUN 12  CREATININE 0.91  CALCIUM 9.0  GLUCOSE 102*     Discharge Medications:    Medication List    STOP taking these medications        betamethasone dipropionate 0.05 % cream  Commonly known as:  DIPROLENE     diltiazem 360 MG 24 hr capsule  Commonly known as:  CARDIZEM CD     dronedarone 400 MG tablet  Commonly known as:  MULTAQ      methylPREDNISolone 4 MG Tbpk tablet  Commonly known as:  MEDROL DOSEPAK     OVER THE COUNTER MEDICATION     PROAIR HFA 108 (90 Base) MCG/ACT inhaler  Generic drug:  albuterol      TAKE these medications        apixaban 5 MG Tabs tablet  Commonly known as:  ELIQUIS  Take 1 tablet (5 mg total) by mouth 2 (two) times daily.     augmented betamethasone dipropionate 0.05 % cream  Commonly known as:  DIPROLENE-AF  APPLY TWICE DAILY FOR PSORIASIS. DO NOT APPLY TO FACE     Calcium 600-400 MG-UNIT Chew  Chew 1 each by mouth 2 (two) times daily.     carvedilol 6.25 MG tablet  Commonly known as:  COREG  Take 1 tablet (6.25 mg total) by mouth 2 (two) times daily with a meal.     Co Q10 100 MG Caps  Take 100 mg by mouth daily.     dofetilide 250 MCG capsule  Commonly known as:  TIKOSYN  Take 1 capsule (250 mcg total) by mouth 2 (two) times daily.     levothyroxine 50 MCG tablet  Commonly known as:  SYNTHROID, LEVOTHROID  TAKE 1 TABLET (50 MCG TOTAL) BY MOUTH DAILY.     lisinopril 5 MG tablet  Commonly known as:  PRINIVIL,ZESTRIL  Take 1 tablet (5 mg total) by mouth daily.     loratadine 10 MG tablet  Commonly known as:  CLARITIN  TAKE 1 TABLET BY ORAL ROUTE EVERY DAY FOR ALLERGY RELIEF     Magnesium 250 MG Tabs  Take 500 mg by mouth every evening.     montelukast 10 MG tablet  Commonly known as:  SINGULAIR  TAKE 1 TABLET BY MOUTH EVERY DAY     multivitamin with minerals Tabs tablet  Take 1 tablet by mouth daily.     naphazoline-pheniramine 0.025-0.3 % ophthalmic solution  Commonly known as:  NAPHCON-A  Place 1 drop into both eyes every morning.     NONFORMULARY OR COMPOUNDED ITEM  4 (four) times a week. Estriol/testosterone 0.25-0.25 suppository compound. Take on Monday Tuesday Thursday Friday     NONFORMULARY OR COMPOUNDED ITEM  Place onto the skin daily. Biest 8/2 HRT compound     PROBIOTIC DAILY PO  Take 1 tablet by mouth daily.     PROGESTERONE (VAGINAL) 4 %  Gel  Place onto the skin. Days 1-25 of each month     Turmeric 500 MG Caps  Take 1 tablet 2 x day     VITAMIN D PO  Take 10,000 Units by mouth daily.        Disposition:  Home  Follow-up Information    Follow up with [facility] On 11/04/2015.  Specialty:  Cardiology   Why:  8:30AM   Contact information:   [id number] [place] [phone]      Follow up with Jametta Moorehead Meredith Leeds, MD On 11/28/2015.   Specialty:  Cardiology   Why:  10:45AM   Contact information:   [place] [address] [phone]       Duration of Discharge Encounter: Greater than 30 minutes including physician time.  Signed, Chanetta Marshall, NP 10/28/2015 11:28 AM    I have seen and examined this patient with Tommye Standard.  Agree with above, note added to reflect my findings.  On exam, regular rhythm, no murmurs, lungs clear.  Had cardioversion and admitted for tikosyn loading.  Tolerated Tikosyn without issues.  Plan for discharge on 250 mcg.  Annesha Delgreco M. Stormey Wilborn MD 10/30/2015 8:11 PM

## 2015-10-28 NOTE — Progress Notes (Signed)
Discharge teaching and instructions reviewed. Pt has no further questions. VSS. tikosyn picked up from pharmacy and given to pt. Pt discharging home via friend.

## 2015-11-04 ENCOUNTER — Other Ambulatory Visit: Payer: Self-pay | Admitting: Internal Medicine

## 2015-11-04 ENCOUNTER — Other Ambulatory Visit: Payer: Self-pay

## 2015-11-04 ENCOUNTER — Encounter (HOSPITAL_COMMUNITY): Payer: Self-pay | Admitting: Nurse Practitioner

## 2015-11-04 ENCOUNTER — Ambulatory Visit (HOSPITAL_COMMUNITY)
Admission: RE | Admit: 2015-11-04 | Discharge: 2015-11-04 | Disposition: A | Discharge status: Home / Self Care | Payer: 59 | Source: Ambulatory Visit | Attending: Nurse Practitioner | Admitting: Nurse Practitioner

## 2015-11-04 VITALS — BP 138/70 | HR 58 | Ht 65.0 in | Wt 213.2 lb

## 2015-11-04 DIAGNOSIS — E559 Vitamin D deficiency, unspecified: Secondary | ICD-10-CM | POA: Insufficient documentation

## 2015-11-04 DIAGNOSIS — I480 Paroxysmal atrial fibrillation: Secondary | ICD-10-CM | POA: Diagnosis not present

## 2015-11-04 DIAGNOSIS — Z7901 Long term (current) use of anticoagulants: Secondary | ICD-10-CM | POA: Diagnosis not present

## 2015-11-04 DIAGNOSIS — L409 Psoriasis, unspecified: Secondary | ICD-10-CM | POA: Diagnosis not present

## 2015-11-04 DIAGNOSIS — Z79899 Other long term (current) drug therapy: Secondary | ICD-10-CM | POA: Diagnosis not present

## 2015-11-04 DIAGNOSIS — E039 Hypothyroidism, unspecified: Secondary | ICD-10-CM | POA: Insufficient documentation

## 2015-11-04 DIAGNOSIS — I4892 Unspecified atrial flutter: Secondary | ICD-10-CM | POA: Diagnosis not present

## 2015-11-04 DIAGNOSIS — M1990 Unspecified osteoarthritis, unspecified site: Secondary | ICD-10-CM | POA: Insufficient documentation

## 2015-11-04 DIAGNOSIS — Z1231 Encounter for screening mammogram for malignant neoplasm of breast: Secondary | ICD-10-CM

## 2015-11-04 LAB — BASIC METABOLIC PANEL
Anion gap: 5 (ref 5–15)
BUN: 12 mg/dL (ref 6–20)
CO2: 26 mmol/L (ref 22–32)
Calcium: 9.7 mg/dL (ref 8.9–10.3)
Chloride: 109 mmol/L (ref 101–111)
Creatinine, Ser: 0.83 mg/dL (ref 0.44–1.00)
GFR calc Af Amer: >60 mL/min (ref >60)
GFR calc non Af Amer: >60 mL/min (ref >60)
Glucose, Bld: 92 mg/dL (ref 65–99)
Potassium: 4.2 mmol/L (ref 3.5–5.1)
Sodium: 140 mmol/L (ref 135–145)

## 2015-11-04 LAB — MAGNESIUM: Magnesium: 2.2 mg/dL (ref 1.7–2.4)

## 2015-11-04 MED ORDER — FUROSEMIDE 20 MG PO TABS: 20.0000 mg | ORAL_TABLET | ORAL | Status: DC | PRN

## 2015-11-04 NOTE — Progress Notes (Signed)
Patient ID: Monica Morgan, female   DOB: Jan 28, 1961, 55 y.o.   MRN: [id number]     Primary Care Physician: Alesia Richards, MD Referring Physician: [facility] f/u   Monica Morgan is a 55 y.o. female with a h/o PAF, chadsvasc score of 2, TMC, admitted forTikosyn initiation. Renal function and electrolytes were followed during the hospitalization. TEE noted LVEF 30-35%, f/u TTE her LVEF 35-40%. Her medicines adjusted accordingly to d/c her CCB and start BB/ACE tx. She QTc became prolonged on 524mcg dose and was decreased to 255mcg BID and remained stable at this dose. She were monitored until discharge on telemetry which demonstrated SR. On the day of discharge, she was examined by Dr Curt Bears who considered her stable for discharge to home. Follow-up arranged with the AFib clinic in 1 week for EKG and labs and with Dr Curt Bears in 4 weeks. Follow up with Dr. Radford Pax will be planned after her visit with Dr. Curt Bears in 1 month.   F/u in afib clinic s/p one week post hospitalization for tikosyn load. QTC stable, no further afib. Reviewed with pt Tikosyn guidelines re administration and drug interactions. Her EF was around 35% with latest echo and she is having LLE just starting yesterday. She is drinking 8 16 oz bottles of water and was asked to cut back on fluid intake.On eliquis for chadsvasc score of at least 2(female, HF)  Today, she denies symptoms of palpitations, chest pain, shortness of breath, orthopnea, PND, lower extremity edema, dizziness, presyncope, syncope, or neurologic sequela. The patient is tolerating medications without difficulties and is otherwise without complaint today.   Past Medical History  Diagnosis Date  . Rosacea   . Psoriasis   . Vitamin D deficiency   . Hypothyroidism   . PAF (paroxysmal atrial fibrillation) ([place])   . Atrial flutter ([place]) 10/2015  . Malignant melanoma of leg ([place])     "right thigh"  . Heart murmur   . Asthma attack X 1 10/2015  . Pneumonia  1966; 1967    "left lung collapsed one of these times"  . Hepatitis 1972  . Arthritis     "probably in my knees" (10/25/2015)   Past Surgical History  Procedure Laterality Date  . Melanoma excision Right 10/1975    outer thigh "malignant"  . Cesarean section with bilateral tubal ligation  1982  . Cesarean section  1980  . Cervical ablation  ~ 2012  . Tee without cardioversion N/A 10/25/2015    Procedure: TRANSESOPHAGEAL ECHOCARDIOGRAM (TEE);  Surgeon: Sueanne Margarita, MD;  Location: [facility];  Service: Cardiovascular;  Laterality: N/A;  . Cardioversion N/A 10/25/2015    Procedure: CARDIOVERSION;  Surgeon: Sueanne Margarita, MD;  Location: [facility];  Service: Cardiovascular;  Laterality: N/A;  . Tonsillectomy  1967  . Refractive surgery Bilateral 1990s    Current Outpatient Prescriptions  Medication Sig Dispense Refill  . apixaban (ELIQUIS) 5 MG TABS tablet Take 1 tablet (5 mg total) by mouth 2 (two) times daily. 60 tablet 6  . augmented betamethasone dipropionate (DIPROLENE-AF) 0.05 % cream APPLY TWICE DAILY FOR PSORIASIS. DO NOT APPLY TO FACE (Patient taking differently: Apply 1 application topically daily as needed (FOR PSORIASIS). APPLY TWICE DAILY FOR PSORIASIS. DO NOT APPLY TO FACE) 50 g 1  . Calcium 600-400 MG-UNIT CHEW Chew 1 each by mouth 2 (two) times daily.     . carvedilol (COREG) 6.25 MG tablet Take 1 tablet (6.25 mg total) by mouth 2 (two) times daily with a meal. 60  tablet 3  . Cholecalciferol (VITAMIN D PO) Take 10,000 Units by mouth daily.     . Coenzyme Q10 (CO Q10) 100 MG CAPS Take 100 mg by mouth daily.     Marland Kitchen dofetilide (TIKOSYN) 250 MCG capsule Take 1 capsule (250 mcg total) by mouth 2 (two) times daily. 60 capsule 3  . levothyroxine (SYNTHROID, LEVOTHROID) 50 MCG tablet TAKE 1 TABLET (50 MCG TOTAL) BY MOUTH DAILY. 90 tablet 1  . lisinopril (PRINIVIL,ZESTRIL) 5 MG tablet Take 1 tablet (5 mg total) by mouth daily. 30 tablet 3  . loratadine (CLARITIN) 10 MG tablet  TAKE 1 TABLET BY ORAL ROUTE EVERY DAY FOR ALLERGY RELIEF  0  . Magnesium 250 MG TABS Take 500 mg by mouth every evening.     . montelukast (SINGULAIR) 10 MG tablet TAKE 1 TABLET BY MOUTH EVERY DAY 90 tablet 1  . Multiple Vitamin (MULTIVITAMIN WITH MINERALS) TABS tablet Take 1 tablet by mouth daily.    . naphazoline-pheniramine (NAPHCON-A) 0.025-0.3 % ophthalmic solution Place 1 drop into both eyes every morning. Reported on 11/04/2015    . NONFORMULARY OR COMPOUNDED ITEM 4 (four) times a week. Estriol/testosterone 0.25-0.25 suppository compound. Take on Monday Tuesday Thursday Friday    . NONFORMULARY OR COMPOUNDED ITEM Place onto the skin daily. Biest 8/2 HRT compound    . Probiotic Product (PROBIOTIC DAILY PO) Take 1 tablet by mouth daily.    Marland Kitchen PROGESTERONE, VAGINAL, 4 % GEL Place onto the skin. Days 1-25 of each month    . Turmeric 500 MG CAPS Take 1 tablet 2 x day (Patient taking differently: Take 500 mg by mouth 2 (two) times daily. Take 1 tablet 2 x day)    . furosemide (LASIX) 20 MG tablet Take 1 tablet (20 mg total) by mouth as needed (take 1 tablet daily as needed for 3-5lb weight gain, increased swelling). 30 tablet 2   No current facility-administered medications for this encounter.    Allergies  Allergen Reactions  . Lipitor [Atorvastatin] Other (See Comments)    "neck pain"  . Metoprolol Other (See Comments)    DIDN'T WORK FOR PATIENT AND GAINED WEIGHT MIGHT HAVE BEEN TAKEN WITH RYTHMOL  . Rythmol [Propafenone] Other (See Comments)    "weight gain"  . Sulfa Drugs Cross Reactors Other (See Comments)    "soreness all over"    Social History   Social History  . Marital Status: Divorced    Spouse Name: N/A  . Number of Children: N/A  . Years of Education: N/A   Occupational History  . Not on file.   Social History Main Topics  . Smoking status: Never Smoker   . Smokeless tobacco: Never Used  . Alcohol Use: 2.4 oz/week    2 Shots of liquor, 2 Glasses of wine per week      Comment: 10/25/2015 "drink socially on the weekends"  . Drug Use: No  . Sexual Activity: Yes   Other Topics Concern  . Not on file   Social History Narrative    Family History  Problem Relation Age of Onset  . Heart disease Mother   . Hypertension Mother   . Heart disease Father   . Diabetes Father   . Hypertension Father   . Colon cancer Neg Hx   . Heart attack Mother   . Heart attack Brother   . Stroke Neg Hx     ROS- All systems are reviewed and negative except as per the HPI above  Physical Exam:  Filed Vitals:   11/04/15 0844  BP: 138/70  Pulse: 58  Height: 5\' 5"  (1.651 m)  Weight: 213 lb 3.2 oz (96.707 kg)    GEN- The patient is well appearing, alert and oriented x 3 today.   Head- normocephalic, atraumatic Eyes-  Sclera clear, conjunctiva pink Ears- hearing intact Oropharynx- clear Neck- supple, no JVP Lymph- no cervical lymphadenopathy Lungs- Clear to ausculation bilaterally, normal work of breathing Heart- Regular rate and rhythm, no murmurs, rubs or gallops, PMI not laterally displaced GI- soft, NT, ND, + BS Extremities- no clubbing, cyanosis, or edema MS- no significant deformity or atrophy Skin- no rash or lesion Psych- euthymic mood, full affect Neuro- strength and sensation are intact  EKG- Sinus brady at 58 bpm, pr int 154 ms, qrs int 98 ms, qtc 443 ms Epic records reviewed  Assessment and Plan: 1. Afib Maintaining SR on 250 mcg bid Continue eliquis 5 mg bid Bmet/mag today  2. TMC EF around 35% Continue lisinopril Cut back on water consumption Weigh daily Avoid salt Lasix to use as needed for 3-5 lb weight gain over 1-2 days or increase in Pedal edema  F/u with Dr. Curt Bears 8/14  Butch Penny C. Evian Derringer, [facility] [address] [place], [place] [address] [phone]

## 2015-11-04 NOTE — Patient Instructions (Signed)
Your physician has recommended you make the following change in your medication:  1)Lasix 20mg  -- take 1 tablet daily as needed for Increased weight gain 3-5lbs, lower extremity swelling.  Decrease water intake

## 2015-11-08 ENCOUNTER — Encounter: Payer: Self-pay | Admitting: Cardiology

## 2015-11-27 NOTE — Progress Notes (Signed)
Electrophysiology Office Note   Date:  11/28/2015   ID:  Monica Morgan, DOB 1960-11-18, MRN [id number]  PCP:  Alesia Richards, MD  Cardiologist:  Radford Pax Primary Electrophysiologist:  Aleksi Brummet Meredith Leeds, MD    Chief Complaint  Patient presents with  . Follow-up  . paroxysmal atrial fibrillation     History of Present Illness: Monica Morgan is a 55 y.o. female who presents today for electrophysiology evaluation.   Recent admission for tikosyn loading.  Had TEE/CV which restored sinus rhythm.  TEE noted EF 35-40%.  Discharged in sinus rhythm.  Overall she is feeling well, but she does have approximately 2 days per week where she feels like she is in atrial fibrillation. She says that these episodes occur out of the blue with out exacerbating or alleviating factors. She does say that she feels better after being on the dofetilide. She has had to take 2 doses of Lasix for excess fluid, and has noted that she was in atrial fibrillation after taking the Lasix.    Today, she denies symptoms of palpitations, chest pain, shortness of breath, orthopnea, PND, lower extremity edema, claudication, dizziness, presyncope, syncope, bleeding, or neurologic sequela. The patient is tolerating medications without difficulties and is otherwise without complaint today.    Past Medical History:  Diagnosis Date  . Arthritis    "probably in my knees" (10/25/2015)  . Asthma attack X 1 10/2015  . Atrial flutter ([place]) 10/2015  . Heart murmur   . Hepatitis 1972  . Hypothyroidism   . Malignant melanoma of leg ([place])    "right thigh"  . PAF (paroxysmal atrial fibrillation) ([place])   . Pneumonia 1966; 1967   "left lung collapsed one of these times"  . Psoriasis   . Rosacea   . Vitamin D deficiency    Past Surgical History:  Procedure Laterality Date  . CARDIOVERSION N/A 10/25/2015   Procedure: CARDIOVERSION;  Surgeon: Sueanne Margarita, MD;  Location: [facility];  Service: Cardiovascular;   Laterality: N/A;  . CERVICAL ABLATION  ~ 2012  . CESAREAN SECTION  1980  . CESAREAN SECTION WITH BILATERAL TUBAL LIGATION  1982  . MELANOMA EXCISION Right 10/1975   outer thigh "malignant"  . REFRACTIVE SURGERY Bilateral 1990s  . TEE WITHOUT CARDIOVERSION N/A 10/25/2015   Procedure: TRANSESOPHAGEAL ECHOCARDIOGRAM (TEE);  Surgeon: Sueanne Margarita, MD;  Location: [facility] END[facility]COPY;  Service: Cardiovascular;  Laterality: N/A;  . TONSILLECTOMY  1967     Current Outpatient Prescriptions  Medication Sig Dispense Refill  . apixaban (ELIQUIS) 5 MG TABS tablet Take 1 tablet (5 mg total) by mouth 2 (two) times daily. 60 tablet 6  . augmented betamethasone dipropionate (DIPROLENE-AF) 0.05 % cream APPLY TWICE DAILY FOR PSORIASIS. DO NOT APPLY TO FACE (Patient taking differently: Apply 1 application topically daily as needed (FOR PSORIASIS). APPLY TWICE DAILY FOR PSORIASIS. DO NOT APPLY TO FACE) 50 g 1  . Calcium 600-400 MG-UNIT CHEW Chew 1 each by mouth 2 (two) times daily.     . carvedilol (COREG) 6.25 MG tablet Take 1 tablet (6.25 mg total) by mouth 2 (two) times daily with a meal. 60 tablet 3  . Cholecalciferol (VITAMIN D PO) Take 10,000 Units by mouth daily.     . Coenzyme Q10 (CO Q10) 100 MG CAPS Take 100 mg by mouth daily.     Marland Kitchen dofetilide (TIKOSYN) 250 MCG capsule Take 1 capsule (250 mcg total) by mouth 2 (two) times daily. 60 capsule 3  . furosemide (LASIX) 20  MG tablet Take 1 tablet (20 mg total) by mouth as needed (take 1 tablet daily as needed for 3-5lb weight gain, increased swelling). 30 tablet 2  . levothyroxine (SYNTHROID, LEVOTHROID) 50 MCG tablet TAKE 1 TABLET (50 MCG TOTAL) BY MOUTH DAILY. 90 tablet 1  . lisinopril (PRINIVIL,ZESTRIL) 5 MG tablet Take 1 tablet (5 mg total) by mouth daily. 30 tablet 3  . loratadine (CLARITIN) 10 MG tablet TAKE 1 TABLET BY ORAL ROUTE EVERY DAY FOR ALLERGY RELIEF  0  . Magnesium 250 MG TABS Take 500 mg by mouth every evening.     . montelukast (SINGULAIR) 10 MG  tablet TAKE 1 TABLET BY MOUTH EVERY DAY 90 tablet 1  . Multiple Vitamin (MULTIVITAMIN WITH MINERALS) TABS tablet Take 1 tablet by mouth daily.    . naphazoline-pheniramine (NAPHCON-A) 0.025-0.3 % ophthalmic solution Place 1 drop into both eyes every morning. Reported on 11/04/2015    . NONFORMULARY OR COMPOUNDED ITEM 4 (four) times a week. Estriol/testosterone 0.25-0.25 suppository compound. Take on Monday Tuesday Thursday Friday    . NONFORMULARY OR COMPOUNDED ITEM Place onto the skin daily. Biest 8/2 HRT compound    . Probiotic Product (PROBIOTIC DAILY PO) Take 1 tablet by mouth daily.    Marland Kitchen PROGESTERONE, VAGINAL, 4 % GEL Place onto the skin. Days 1-25 of each month    . Turmeric 500 MG CAPS Take 500 mg by mouth 2 (two) times daily.     No current facility-administered medications for this visit.     Allergies:   Lipitor [atorvastatin]; Metoprolol; Rythmol [propafenone]; and Sulfa drugs cross reactors   Social History:  The patient  reports that she has never smoked. She has never used smokeless tobacco. She reports that she drinks about 2.4 oz of alcohol per week . She reports that she does not use drugs.   Family History:  The patient's family history includes Diabetes in her father; Heart attack in her brother and mother; Heart disease in her father and mother; Hypertension in her father and mother.    ROS:  Please see the history of present illness.   Otherwise, review of systems is positive for palpitations, leg swelling, cough.   All other systems are reviewed and negative.    PHYSICAL EXAM: VS:  BP 128/76   Pulse (!) 48   Ht 5\' 6"  (1.676 m)   Wt 214 lb 6.4 oz (97.3 kg)   BMI 34.61 kg/m  , BMI Body mass index is 34.61 kg/m. GEN: Well nourished, well developed, in no acute distress  HEENT: normal  Neck: no JVD, carotid bruits, or masses Cardiac: RRR; no murmurs, rubs, or gallops,no edema  Respiratory:  clear to auscultation bilaterally, normal work of breathing GI: soft,  nontender, nondistended, + BS MS: no deformity or atrophy  Skin: warm and dry Neuro:  Strength and sensation are intact Psych: euthymic mood, full affect  EKG:  EKG is ordered today. Personal review of the ekg ordered shows sinus rhythm, rate 48, QTc 464  Second ECG showing sinus rhythm with atrial runs  Recent Labs: 10/20/2015: ALT 22; B Natriuretic Peptide 209.7; Hemoglobin 12.9; Platelets 410; TSH 0.951 11/04/2015: BUN 12; Creatinine, Ser 0.83; Magnesium 2.2; Potassium 4.2; Sodium 140    Lipid Panel     Component Value Date/Time   CHOL 195 06/23/2015 0921   TRIG 113 06/23/2015 0921   HDL 46 06/23/2015 0921   CHOLHDL 4.2 06/23/2015 0921   VLDL 23 06/23/2015 0921   LDLCALC 126 06/23/2015 0921  Wt Readings from Last 3 Encounters:  11/28/15 214 lb 6.4 oz (97.3 kg)  11/04/15 213 lb 3.2 oz (96.7 kg)  10/28/15 208 lb 12.8 oz (94.7 kg)      Other studies Reviewed: Additional studies/ records that were reviewed today include: TTE 10/26/15  Review of the above records today demonstrates:  - Left ventricle: The cavity size was normal. Systolic function was   moderately reduced. The estimated ejection fraction was in the   range of 35% to 40%. Diffuse hypokinesis. Features are consistent   with a pseudonormal left ventricular filling pattern, with   concomitant abnormal relaxation and increased filling pressure   (grade 2 diastolic dysfunction). Doppler parameters are   consistent with elevated ventricular end-diastolic filling   pressure. - Aortic valve: Trileaflet; normal thickness leaflets. There was no   regurgitation. - Aortic root: The aortic root was normal in size. - Ascending aorta: The ascending aorta was normal in size. - Mitral valve: There was mild regurgitation. - Left atrium: The atrium was mildly dilated. - Tricuspid valve: There was mild regurgitation. - Pulmonary arteries: Systolic pressure was mildly increased. PA   peak pressure: 38 mm Hg (S). -  Inferior vena cava: The vessel was normal in size. - Pericardium, extracardiac: A trivial pericardial effusion was   identified posterior to the heart.   ASSESSMENT AND PLAN:  1.  Persistent atrial fibrillation: recent admission for tikosyn.  On Eliquis. She is continuing to have breakthrough symptoms of atrial fibrillation. I discussed with her further options including ablation. Risks and benefits of the procedure were discussed. Risks include bleeding, tamponade, heart block, stroke, and damage to surrounding organs. The patient understands these risks and has agreed to the procedure. We Ermie Glendenning continue her on the dofetilide at the current dose until after the ablation.  This patients CHA2DS2-VASc Score and unadjusted Ischemic Stroke Rate (% per year) is equal to 3.2 % stroke rate/year from a score of 3  Above score calculated as 1 point each if present [CHF, HTN, DM, Vascular=MI/PAD/Aortic Plaque, Age if 65-74, or Female] Above score calculated as 2 points each if present [Age > 75, or Stroke/TIA/TE]  2. Hypertension: Well controlled today.   Current medicines are reviewed at length with the patient today.   The patient does not have concerns regarding her medicines.  The following changes were made today:  none  Labs/ tests ordered today include:  No orders of the defined types were placed in this encounter.    Disposition:   FU with Tommie Bohlken 4 months  Signed, Suhaan Perleberg Meredith Leeds, MD  11/28/2015 11:06 AM     [facility] [address] [place] [address] [phone] (office) [phone] (fax)

## 2015-11-28 ENCOUNTER — Encounter: Payer: Self-pay | Admitting: Cardiology

## 2015-11-28 ENCOUNTER — Ambulatory Visit (INDEPENDENT_AMBULATORY_CARE_PROVIDER_SITE_OTHER): Payer: 59 | Admitting: Cardiology

## 2015-11-28 VITALS — BP 128/76 | HR 48 | Ht 66.0 in | Wt 214.4 lb

## 2015-11-28 DIAGNOSIS — I481 Persistent atrial fibrillation: Secondary | ICD-10-CM | POA: Diagnosis not present

## 2015-11-28 DIAGNOSIS — I4819 Other persistent atrial fibrillation: Secondary | ICD-10-CM

## 2015-11-28 NOTE — Patient Instructions (Signed)
Your physician recommends that you continue on your current medications as directed. Please refer to the Current Medication list given to you today. Your physician has recommended that you have an ablation. Catheter ablation is a medical procedure used to treat some cardiac arrhythmias (irregular heartbeats). During catheter ablation, a long, thin, flexible tube is put into a blood vessel in your groin (upper thigh), or neck. This tube is called an ablation catheter. It is then guided to your heart through the blood vessel. Radio frequency waves destroy small areas of heart tissue where abnormal heartbeats may cause an arrhythmia to start. Please see the instruction sheet given to you today.  Dr. Macky Lower nurse, Sherri, RN will contact you to schedule this procedure for late September.

## 2015-11-29 ENCOUNTER — Telehealth: Payer: Self-pay | Admitting: Cardiology

## 2015-11-29 DIAGNOSIS — Z01812 Encounter for preprocedural laboratory examination: Secondary | ICD-10-CM

## 2015-11-29 DIAGNOSIS — I4819 Other persistent atrial fibrillation: Secondary | ICD-10-CM

## 2015-11-29 NOTE — Telephone Encounter (Signed)
Monica Morgan is calling to see if she can get the procedure ( Ablation) schedule for 01/10/16. Please call   Thanks

## 2015-12-02 NOTE — Telephone Encounter (Signed)
Follow up    Pt verbalized that she is calling because she is wanting to go ahead and schedule the ablations. Pt has a very short window and would like to get it scheduled.  9-26 or 9-27  late september

## 2015-12-05 ENCOUNTER — Encounter: Payer: Self-pay | Admitting: Cardiology

## 2015-12-05 NOTE — Telephone Encounter (Signed)
Returned pt's phone call.  Apologized to patient for delayed return call -- informed her that I have been out of the office for the last several weeks.  She was not informed of this when she called in.  Patient would like to schedule ablation for 01/13/16.  She is aware I will arrange and call her in the next day/two to review instructions. H&P appt (pt is 1st case on 9/29) scheduled for 9/19.   Cleared patient to attend work seminar/meeting on 10/5, per Dr. Curt Bears.

## 2015-12-05 NOTE — Telephone Encounter (Signed)
Call Documentation   Monica Morgan at 12/05/2015 3:15 PM   Status: Signed    New message  Pt call requesting to speak with RN. Pt states she is calling to schedule Ablation. Please call back to discuss

## 2015-12-05 NOTE — Telephone Encounter (Signed)
New message  Pt call requesting to speak with RN. Pt states she is calling to schedule Ablation. Please call back to discuss

## 2015-12-05 NOTE — Telephone Encounter (Signed)
This encounter was created in error - please disregard.

## 2015-12-06 ENCOUNTER — Encounter: Payer: Self-pay | Admitting: *Deleted

## 2015-12-06 NOTE — Telephone Encounter (Signed)
Reviewed instructions w/ patient. She understands office will call her to arrange CT. She will f/u on 9/19 for H&P/labs. She thanks me for helping with this.

## 2015-12-06 NOTE — Telephone Encounter (Signed)
lmtcb to review procedure/instructions.

## 2015-12-07 ENCOUNTER — Ambulatory Visit
Admission: RE | Admit: 2015-12-07 | Discharge: 2015-12-07 | Disposition: A | Discharge status: Home / Self Care | Payer: 59 | Source: Ambulatory Visit | Attending: Internal Medicine | Admitting: Internal Medicine

## 2015-12-07 DIAGNOSIS — Z1231 Encounter for screening mammogram for malignant neoplasm of breast: Secondary | ICD-10-CM

## 2015-12-12 ENCOUNTER — Other Ambulatory Visit: Payer: Self-pay | Admitting: *Deleted

## 2015-12-12 DIAGNOSIS — I4819 Other persistent atrial fibrillation: Secondary | ICD-10-CM

## 2015-12-13 ENCOUNTER — Encounter: Payer: Self-pay | Admitting: Cardiology

## 2016-01-02 NOTE — Progress Notes (Signed)
Electrophysiology Office Note   Date:  01/03/2016   ID:  Monica Morgan, DOB [birth year], MRN [id number]  PCP:  Alesia Richards, MD  Cardiologist:  Radford Pax Primary Electrophysiologist:  Arbadella Kimbler Meredith Leeds, MD    Chief Complaint  Patient presents with  . Atrial Fibrillation     History of Present Illness: Monica Morgan is a 55 y.o. female who presents today for electrophysiology evaluation.   Recent admission for tikosyn loading.  Had TEE/CV which restored sinus rhythm.  TEE noted EF 35-40%.  Discharged in sinus rhythm.  Continuing to have AF symptoms. She has palpitations a few times a week, which are in general short lived.   Today, she denies symptoms of palpitations, chest pain, shortness of breath, orthopnea, PND, lower extremity edema, claudication, dizziness, presyncope, syncope, bleeding, or neurologic sequela. The patient is tolerating medications without difficulties and is otherwise without complaint today.    Past Medical History:  Diagnosis Date  . Arthritis    "probably in my knees" (10/25/2015)  . Asthma attack X 1 10/2015  . Atrial flutter ([place]) 10/2015  . Heart murmur   . Hepatitis 1972  . Hypothyroidism   . Malignant melanoma of leg ([place])    "right thigh"  . PAF (paroxysmal atrial fibrillation) ([place])   . Pneumonia 1966; 1967   "left lung collapsed one of these times"  . Psoriasis   . Rosacea   . Vitamin D deficiency    Past Surgical History:  Procedure Laterality Date  . CARDIOVERSION N/A 10/25/2015   Procedure: CARDIOVERSION;  Surgeon: Sueanne Margarita, MD;  Location: [facility] [place];  Service: Cardiovascular;  Laterality: N/A;  . CERVICAL ABLATION  ~ 2012  . CESAREAN SECTION  1980  . CESAREAN SECTION WITH BILATERAL TUBAL LIGATION  1982  . MELANOMA EXCISION Right 10/1975   outer thigh "malignant"  . REFRACTIVE SURGERY Bilateral 1990s  . TEE WITHOUT CARDIOVERSION N/A 10/25/2015   Procedure: TRANSESOPHAGEAL ECHOCARDIOGRAM (TEE);  Surgeon: Sueanne Margarita, MD;  Location: [facility] ENDOSCOPY;  Service: Cardiovascular;  Laterality: N/A;  . TONSILLECTOMY  1967     Current Outpatient Prescriptions  Medication Sig Dispense Refill  . apixaban (ELIQUIS) 5 MG TABS tablet Take 1 tablet (5 mg total) by mouth 2 (two) times daily. 60 tablet 6  . augmented betamethasone dipropionate (DIPROLENE-AF) 0.05 % cream Apply 1 application topically daily as needed (Psoriasis).    . Calcium 600-400 MG-UNIT CHEW Chew 1 each by mouth 2 (two) times daily.     . carvedilol (COREG) 6.25 MG tablet Take 1 tablet (6.25 mg total) by mouth 2 (two) times daily with a meal. 60 tablet 3  . Cholecalciferol (VITAMIN D PO) Take 10,000 Units by mouth daily.     . Coenzyme Q10 (CO Q10) 100 MG CAPS Take 100 mg by mouth daily.     Marland Kitchen dofetilide (TIKOSYN) 250 MCG capsule Take 1 capsule (250 mcg total) by mouth 2 (two) times daily. 60 capsule 3  . furosemide (LASIX) 20 MG tablet Take 1 tablet (20 mg total) by mouth as needed (take 1 tablet daily as needed for 3-5lb weight gain, increased swelling). 30 tablet 2  . levothyroxine (SYNTHROID, LEVOTHROID) 50 MCG tablet TAKE 1 TABLET (50 MCG TOTAL) BY MOUTH DAILY. 90 tablet 1  . lisinopril (PRINIVIL,ZESTRIL) 5 MG tablet Take 1 tablet (5 mg total) by mouth daily. 30 tablet 3  . loratadine (CLARITIN) 10 MG tablet TAKE 1 TABLET BY MOUTH DAILY AS NEEDED FOR ALLERGY RELIEF  0  .  Magnesium 250 MG TABS Take 500 mg by mouth every evening.     . montelukast (SINGULAIR) 10 MG tablet TAKE 1 TABLET BY MOUTH EVERY DAY 90 tablet 1  . Multiple Vitamin (MULTIVITAMIN WITH MINERALS) TABS tablet Take 1 tablet by mouth daily.    . naphazoline-pheniramine (NAPHCON-A) 0.025-0.3 % ophthalmic solution Place 1 drop into both eyes every morning. Reported on 11/04/2015    . NONFORMULARY OR COMPOUNDED ITEM 4 (four) times a week. Estriol/testosterone 0.25-0.25 suppository compound. Take on Monday Tuesday Thursday Friday Use as directed    . NONFORMULARY OR COMPOUNDED ITEM  Place onto the skin daily. Biest 8/2 HRT compound  Use as directed    . Probiotic Product (PROBIOTIC DAILY PO) Take 1 tablet by mouth daily.    Marland Kitchen PROGESTERONE, VAGINAL, 4 % GEL Place onto the skin as directed. Days 1-25 of each month     . Turmeric 500 MG CAPS Take 500 mg by mouth 2 (two) times daily.     No current facility-administered medications for this visit.     Allergies:   Lipitor [atorvastatin]; Metoprolol; Rythmol [propafenone]; and Sulfa drugs cross reactors   Social History:  The patient  reports that she has never smoked. She has never used smokeless tobacco. She reports that she drinks about 2.4 oz of alcohol per week . She reports that she does not use drugs.   Family History:  The patient's family history includes Diabetes in her father; Heart attack in her brother and mother; Heart disease in her father and mother; Hypertension in her father and mother.    ROS:  Please see the history of present illness.   Otherwise, review of systems is positive for palpitations.   All other systems are reviewed and negative.    PHYSICAL EXAM: VS:  BP 128/70   Pulse 63   Ht 5\' 6"  (1.676 m)   Wt 223 lb (101.2 kg)   SpO2 97%   BMI 35.99 kg/m  , BMI Body mass index is 35.99 kg/m. GEN: Well nourished, well developed, in no acute distress  HEENT: normal  Neck: no JVD, carotid bruits, or masses Cardiac: RRR; no murmurs, rubs, or gallops,no edema  Respiratory:  clear to auscultation bilaterally, normal work of breathing GI: soft, nontender, nondistended, + BS MS: no deformity or atrophy  Skin: warm and dry Neuro:  Strength and sensation are intact Psych: euthymic mood, full affect  EKG:  EKG is not ordered today. Personal review of the ekg ordered 8/14 shows sinus rhythm, rate 48  Second ECG showing sinus rhythm with atrial runs  Recent Labs: 10/20/2015: ALT 22; B Natriuretic Peptide 209.7; Hemoglobin 12.9; Platelets 410; TSH 0.951 11/04/2015: BUN 12; Creatinine, Ser 0.83;  Magnesium 2.2; Potassium 4.2; Sodium 140    Lipid Panel     Component Value Date/Time   CHOL 195 06/23/2015 0921   TRIG 113 06/23/2015 0921   HDL 46 06/23/2015 0921   CHOLHDL 4.2 06/23/2015 0921   VLDL 23 06/23/2015 0921   LDLCALC 126 06/23/2015 0921     Wt Readings from Last 3 Encounters:  01/03/16 223 lb (101.2 kg)  11/28/15 214 lb 6.4 oz (97.3 kg)  11/04/15 213 lb 3.2 oz (96.7 kg)      Other studies Reviewed: Additional studies/ records that were reviewed today include: TTE 10/26/15  Review of the above records today demonstrates:  - Left ventricle: The cavity size was normal. Systolic function was   moderately reduced. The estimated ejection fraction was in  the   range of 35% to 40%. Diffuse hypokinesis. Features are consistent   with a pseudonormal left ventricular filling pattern, with   concomitant abnormal relaxation and increased filling pressure   (grade 2 diastolic dysfunction). Doppler parameters are   consistent with elevated ventricular end-diastolic filling   pressure. - Aortic valve: Trileaflet; normal thickness leaflets. There was no   regurgitation. - Aortic root: The aortic root was normal in size. - Ascending aorta: The ascending aorta was normal in size. - Mitral valve: There was mild regurgitation. - Left atrium: The atrium was mildly dilated. - Tricuspid valve: There was mild regurgitation. - Pulmonary arteries: Systolic pressure was mildly increased. PA   peak pressure: 38 mm Hg (S). - Inferior vena cava: The vessel was normal in size. - Pericardium, extracardiac: A trivial pericardial effusion was   identified posterior to the heart.   ASSESSMENT AND PLAN:  1.  Persistent atrial fibrillation: recent admission for tikosyn.  On Eliquis. She is continuing to have breakthrough symptoms of atrial fibrillation. AF ablation scheduled 12/05/15. Risks and benefits were discussed. Risks include bleeding, tamponade, heart block, stroke, and damage to  surrounding organs. She understands these risks and has agreed to the procedure. We Gwynn Chalker order her for preprocedure labs today.  This patients CHA2DS2-VASc Score and unadjusted Ischemic Stroke Rate (% per year) is equal to 3.2 % stroke rate/year from a score of 3  Above score calculated as 1 point each if present [CHF, HTN, DM, Vascular=MI/PAD/Aortic Plaque, Age if 65-74, or Female] Above score calculated as 2 points each if present [Age > 75, or Stroke/TIA/TE]  2. Hypertension: Well controlled today.  3. Snoring: Snores at night but unsure if she holds her breath. Lavenia Stumpo order a sleep study, as this may help to treat her atrial fibrillation. Current medicines are reviewed at length with the patient today.   The patient does not have concerns regarding her medicines.  The following changes were made today:  none  Labs/ tests ordered today include:  Orders Placed This Encounter  Procedures  . Split night study     Disposition:   FU with Katiya Fike 4 months  Signed, Elijahjames Fuelling Meredith Leeds, MD  01/03/2016 8:39 AM     [facility] [address] [place] [address] [phone] (office) [phone] (fax)

## 2016-01-03 ENCOUNTER — Ambulatory Visit (INDEPENDENT_AMBULATORY_CARE_PROVIDER_SITE_OTHER): Payer: 59 | Admitting: Cardiology

## 2016-01-03 ENCOUNTER — Encounter: Payer: Self-pay | Admitting: Cardiology

## 2016-01-03 VITALS — BP 128/70 | HR 63 | Ht 66.0 in | Wt 223.0 lb

## 2016-01-03 DIAGNOSIS — Z01812 Encounter for preprocedural laboratory examination: Secondary | ICD-10-CM

## 2016-01-03 DIAGNOSIS — R0683 Snoring: Secondary | ICD-10-CM | POA: Diagnosis not present

## 2016-01-03 DIAGNOSIS — I481 Persistent atrial fibrillation: Secondary | ICD-10-CM

## 2016-01-03 DIAGNOSIS — I4819 Other persistent atrial fibrillation: Secondary | ICD-10-CM

## 2016-01-03 LAB — CBC WITH DIFFERENTIAL/PLATELET
Basophils Absolute: 0 cells/uL (ref 0–200)
Basophils Relative: 0 %
Eosinophils Absolute: 158 cells/uL (ref 15–500)
Eosinophils Relative: 2 %
HCT: 38 % (ref 35.0–45.0)
Hemoglobin: 12.8 g/dL (ref 11.7–15.5)
Lymphocytes Relative: 31 %
Lymphs Abs: 2449 cells/uL (ref 850–3900)
MCH: 28.8 pg (ref 27.0–33.0)
MCHC: 33.7 g/dL (ref 32.0–36.0)
MCV: 85.4 fL (ref 80.0–100.0)
MPV: 10.5 fL (ref 7.5–12.5)
Monocytes Absolute: 553 cells/uL (ref 200–950)
Monocytes Relative: 7 %
Neutro Abs: 4740 cells/uL (ref 1500–7800)
Neutrophils Relative %: 60 %
Platelets: 259 10*3/uL (ref 140–400)
RBC: 4.45 MIL/uL (ref 3.80–5.10)
RDW: 14.4 % (ref 11.0–15.0)
WBC: 7.9 10*3/uL (ref 3.8–10.8)

## 2016-01-03 LAB — BASIC METABOLIC PANEL
BUN: 17 mg/dL (ref 7–25)
CO2: 22 mmol/L (ref 20–31)
Calcium: 9 mg/dL (ref 8.6–10.4)
Chloride: 108 mmol/L (ref 98–110)
Creat: 0.79 mg/dL (ref 0.50–1.05)
Glucose, Bld: 85 mg/dL (ref 65–99)
Potassium: 4.5 mmol/L (ref 3.5–5.3)
Sodium: 140 mmol/L (ref 135–146)

## 2016-01-03 NOTE — Addendum Note (Signed)
Addended by: Eulis Foster on: 01/03/2016 08:49 AM   Modules accepted: Orders

## 2016-01-03 NOTE — Patient Instructions (Addendum)
Medication Instructions:    Your physician recommends that you continue on your current medications as directed. Please refer to the Current Medication list given to you today.  --- If you need a refill on your cardiac medications before your next appointment, please call your pharmacy. ---  Labwork:  Pre procedure labs today  Testing/Procedures: Your physician has recommended that you have a sleep study. This test records several body functions during sleep, including: brain activity, eye movement, oxygen and carbon dioxide blood levels, heart rate and rhythm, breathing rate and rhythm, the flow of air through your mouth and nose, snoring, body muscle movements, and chest and belly movement.  Follow-Up:  Your physician recommends that you schedule a follow-up appointment in: 4 weeks, after your procedure on 01/13/2016, with Roderic Palau, NP in the AFib clinic.   Your physician recommends that you schedule a follow-up appointment in: 3 months, after your procedure on 01/13/2016, with Dr. Curt Bears.  Thank you for choosing [facility]!!   Trinidad Curet, RN [phone]

## 2016-01-05 ENCOUNTER — Telehealth (HOSPITAL_COMMUNITY): Payer: Self-pay | Admitting: *Deleted

## 2016-01-05 NOTE — Telephone Encounter (Signed)
LMOM for pt to clbk to sched f/u from 01/13/16 procedure.

## 2016-01-05 NOTE — Telephone Encounter (Signed)
-----   Message from [name] sent at 01/03/2016  8:39 AM EDT ----- Regarding: 4 week f/u with donna after 4/29 Contact: [phone] Please call pt for f/u appt.   Davy Pique

## 2016-01-09 ENCOUNTER — Ambulatory Visit (HOSPITAL_COMMUNITY)
Admission: RE | Admit: 2016-01-09 | Discharge: 2016-01-09 | Disposition: A | Discharge status: Home / Self Care | Payer: 59 | Source: Ambulatory Visit | Attending: Cardiology | Admitting: Cardiology

## 2016-01-09 ENCOUNTER — Encounter (HOSPITAL_COMMUNITY): Payer: Self-pay

## 2016-01-09 ENCOUNTER — Telehealth: Payer: Self-pay | Admitting: Cardiology

## 2016-01-09 DIAGNOSIS — I748 Embolism and thrombosis of other arteries: Secondary | ICD-10-CM | POA: Insufficient documentation

## 2016-01-09 DIAGNOSIS — I4819 Other persistent atrial fibrillation: Secondary | ICD-10-CM

## 2016-01-09 DIAGNOSIS — I481 Persistent atrial fibrillation: Secondary | ICD-10-CM | POA: Diagnosis not present

## 2016-01-09 MED ORDER — IOPAMIDOL (ISOVUE-370) INJECTION 76%
INTRAVENOUS | Status: AC
  Administered 2016-01-09: 80 mL
  Filled 2016-01-09: qty 100

## 2016-01-09 NOTE — Telephone Encounter (Signed)
New message ° °Pt returning RN call. Please call back to discuss  °

## 2016-01-09 NOTE — Telephone Encounter (Signed)
Dr. Curt Bears called patient to inform her she would need a TEE prior to procedure -- cardiac CT showed LAA thrombus present. Patient is agreeable.  States she can go Wednesday morning or Thursday afternoon.  Patient aware I will call first thing in the morning to arrange this procedure.

## 2016-01-10 ENCOUNTER — Other Ambulatory Visit: Payer: Self-pay | Admitting: Cardiology

## 2016-01-10 DIAGNOSIS — I480 Paroxysmal atrial fibrillation: Secondary | ICD-10-CM

## 2016-01-10 NOTE — Telephone Encounter (Signed)
Dr. Curt Bears spoke to patient yesterday afternoon to inform her of LAA thrombus present on pre procedure cardiac CT.  He ordered TEE. TEE scheduled for tomorrow, 9/27.  Patient to arrive at [facility] hospital at 10 am for 11 am procedure. Lab work completed last week on 9/19.  NPO after MN.  She will take essential medications with sip of water.  She has a driver to take her home after procedure. Patient verbalized understanding and agreeable to plan.

## 2016-01-11 ENCOUNTER — Ambulatory Visit (HOSPITAL_COMMUNITY): Payer: 59

## 2016-01-11 ENCOUNTER — Encounter (HOSPITAL_COMMUNITY)
Admission: RE | Disposition: A | Discharge status: Home / Self Care | Payer: Self-pay | Source: Ambulatory Visit | Attending: Cardiovascular Disease

## 2016-01-11 ENCOUNTER — Encounter (HOSPITAL_COMMUNITY): Payer: Self-pay | Admitting: *Deleted

## 2016-01-11 ENCOUNTER — Ambulatory Visit (HOSPITAL_COMMUNITY)
Admission: RE | Admit: 2016-01-11 | Discharge: 2016-01-11 | Disposition: A | Discharge status: Home / Self Care | Payer: 59 | Source: Ambulatory Visit | Attending: Cardiovascular Disease | Admitting: Cardiovascular Disease

## 2016-01-11 DIAGNOSIS — Z8249 Family history of ischemic heart disease and other diseases of the circulatory system: Secondary | ICD-10-CM | POA: Diagnosis not present

## 2016-01-11 DIAGNOSIS — I340 Nonrheumatic mitral (valve) insufficiency: Secondary | ICD-10-CM | POA: Diagnosis not present

## 2016-01-11 DIAGNOSIS — Z85820 Personal history of malignant melanoma of skin: Secondary | ICD-10-CM | POA: Insufficient documentation

## 2016-01-11 DIAGNOSIS — I10 Essential (primary) hypertension: Secondary | ICD-10-CM | POA: Diagnosis not present

## 2016-01-11 DIAGNOSIS — I4891 Unspecified atrial fibrillation: Secondary | ICD-10-CM | POA: Diagnosis not present

## 2016-01-11 DIAGNOSIS — R0683 Snoring: Secondary | ICD-10-CM | POA: Diagnosis not present

## 2016-01-11 DIAGNOSIS — E039 Hypothyroidism, unspecified: Secondary | ICD-10-CM | POA: Diagnosis not present

## 2016-01-11 DIAGNOSIS — I481 Persistent atrial fibrillation: Secondary | ICD-10-CM | POA: Insufficient documentation

## 2016-01-11 DIAGNOSIS — I480 Paroxysmal atrial fibrillation: Secondary | ICD-10-CM

## 2016-01-11 HISTORY — PX: TEE WITHOUT CARDIOVERSION: SHX5443

## 2016-01-11 SURGERY — ECHOCARDIOGRAM, TRANSESOPHAGEAL
Anesthesia: Moderate Sedation

## 2016-01-11 MED ORDER — SODIUM CHLORIDE 0.9 % IV SOLN
INTRAVENOUS | Status: DC
  Administered 2016-01-11: 10:00:00 via INTRAVENOUS

## 2016-01-11 MED ORDER — MIDAZOLAM HCL 10 MG/2ML IJ SOLN
INTRAMUSCULAR | Status: DC | PRN
  Administered 2016-01-11: 1 mg via INTRAVENOUS
  Administered 2016-01-11 (×2): 2 mg via INTRAVENOUS
  Administered 2016-01-11: 1 mg via INTRAVENOUS

## 2016-01-11 MED ORDER — FENTANYL CITRATE (PF) 100 MCG/2ML IJ SOLN
INTRAMUSCULAR | Status: DC | PRN
  Administered 2016-01-11 (×2): 25 ug via INTRAVENOUS

## 2016-01-11 MED ORDER — BUTAMBEN-TETRACAINE-BENZOCAINE 2-2-14 % EX AERO
INHALATION_SPRAY | CUTANEOUS | Status: DC | PRN
  Administered 2016-01-11: 2 via TOPICAL

## 2016-01-11 MED ORDER — MIDAZOLAM HCL 5 MG/ML IJ SOLN
INTRAMUSCULAR | Status: AC
  Filled 2016-01-11: qty 2

## 2016-01-11 MED ORDER — FENTANYL CITRATE (PF) 100 MCG/2ML IJ SOLN
INTRAMUSCULAR | Status: AC
  Filled 2016-01-11: qty 2

## 2016-01-11 MED ORDER — DIPHENHYDRAMINE HCL 50 MG/ML IJ SOLN
INTRAMUSCULAR | Status: AC
  Filled 2016-01-11: qty 1

## 2016-01-11 NOTE — Interval H&P Note (Signed)
History and Physical Interval Note:  01/11/2016 10:43 AM  Monica Morgan  has presented today for surgery, with the diagnosis of ABNORMAL CARDIAC CT, POSSIBLE CLOT  The various methods of treatment have been discussed with the patient and family. After consideration of risks, benefits and other options for treatment, the patient has consented to  Procedure(s): TRANSESOPHAGEAL ECHOCARDIOGRAM (TEE) (N/A) as a surgical intervention .  The patient's history has been reviewed, patient examined, no change in status, stable for surgery.  I have reviewed the patient's chart and labs.  Questions were answered to the patient's satisfaction.     Jenkins Rouge

## 2016-01-11 NOTE — Discharge Instructions (Signed)
TEE  YOU HAD AN CARDIAC PROCEDURE TODAY: Refer to the procedure report and other information in the discharge instructions given to you for any specific questions about what was found during the examination. If this information does not answer your questions, please call [facility] office at [phone] to clarify.   DIET: Your first meal following the procedure should be a light meal and then it is ok to progress to your normal diet. A half-sandwich or bowl of soup is an example of a good first meal. Heavy or fried foods are harder to digest and may make you feel nauseous or bloated. Drink plenty of fluids but you should avoid alcoholic beverages for 24 hours. If you had a esophageal dilation, please see attached instructions for diet.   ACTIVITY: Your care partner should take you home directly after the procedure. You should plan to take it easy, moving slowly for the rest of the day. You can resume normal activity the day after the procedure however YOU SHOULD NOT DRIVE, use power tools, machinery or perform tasks that involve climbing or major physical exertion for 24 hours (because of the sedation medicines used during the test).   SYMPTOMS TO REPORT IMMEDIATELY: A cardiologist can be reached at any hour. Please call [phone] for any of the following symptoms:  Vomiting of blood or coffee ground material  New, significant abdominal pain  New, significant chest pain or pain under the shoulder blades  Painful or persistently difficult swallowing  New shortness of breath  Black, tarry-looking or red, bloody stools  FOLLOW UP:  Please also call with any specific questions about appointments or follow up tests.  Moderate Conscious Sedation, Adult, Care After Refer to this sheet in the next few weeks. These instructions provide you with information on caring for yourself after your procedure. Your health care provider may also give you more specific instructions. Your treatment has been  planned according to current medical practices, but problems sometimes occur. Call your health care provider if you have any problems or questions after your procedure. WHAT TO EXPECT AFTER THE PROCEDURE  After your procedure:  You may feel sleepy, clumsy, and have poor balance for several hours.  Vomiting may occur if you eat too soon after the procedure. HOME CARE INSTRUCTIONS  Do not participate in any activities where you could become injured for at least 24 hours. Do not:  Drive.  Swim.  Ride a bicycle.  Operate heavy machinery.  Cook.  Use power tools.  Climb ladders.  Work from a high place.  Do not make important decisions or sign legal documents until you are improved.  If you vomit, drink water, juice, or soup when you can drink without vomiting. Make sure you have little or no nausea before eating solid foods.  Only take over-the-counter or prescription medicines for pain, discomfort, or fever as directed by your health care provider.  Make sure you and your family fully understand everything about the medicines given to you, including what side effects may occur.  You should not drink alcohol, take sleeping pills, or take medicines that cause drowsiness for at least 24 hours.  If you smoke, do not smoke without supervision.  If you are feeling better, you may resume normal activities 24 hours after you were sedated.  Keep all appointments with your health care provider. SEEK MEDICAL CARE IF:  Your skin is pale or bluish in color.  You continue to feel nauseous or vomit.  Your pain is getting  worse and is not helped by medicine.  You have bleeding or swelling.  You are still sleepy or feeling clumsy after 24 hours. SEEK IMMEDIATE MEDICAL CARE IF:  You develop a rash.  You have difficulty breathing.  You develop any type of allergic problem.  You have a fever. MAKE SURE YOU:  Understand these instructions.  Will watch your condition.  Will  get help right away if you are not doing well or get worse.   This information is not intended to replace advice given to you by your health care provider. Make sure you discuss any questions you have with your health care provider.   Document Released: 01/21/2013 Document Revised: 04/23/2014 Document Reviewed: 01/21/2013 Elsevier Interactive Patient Education [employer].

## 2016-01-11 NOTE — CV Procedure (Signed)
During this procedure the patient is administered a total of Versed 5 mg and Fentanyl 50 mg to achieve and maintain moderate conscious sedation.  The patient's heart rate, blood pressure, and oxygen saturation are monitored continuously during the procedure. The period of conscious sedation is 35 minutes, of which I was present face-to-face 100% of this time.  Moderate LAE MIld RAE Mobile atrial septum with no PFO No LAA thrombus Mild MR Normal RV No aortic debris Normal AV  Ok to proceed with ablation on Friday with Dr Curt Bears

## 2016-01-11 NOTE — H&P (View-Only) (Signed)
Electrophysiology Office Note   Date:  01/03/2016   ID:  Monica Morgan, DOB [birth year], MRN [id number]  PCP:  Alesia Richards, MD  Cardiologist:  Radford Pax Primary Electrophysiologist:  Keiri Solano Meredith Leeds, MD    Chief Complaint  Patient presents with  . Atrial Fibrillation     History of Present Illness: Monica Morgan is a 55 y.o. female who presents today for electrophysiology evaluation.   Recent admission for tikosyn loading.  Had TEE/CV which restored sinus rhythm.  TEE noted EF 35-40%.  Discharged in sinus rhythm.  Continuing to have AF symptoms. She has palpitations a few times a week, which are in general short lived.   Today, she denies symptoms of palpitations, chest pain, shortness of breath, orthopnea, PND, lower extremity edema, claudication, dizziness, presyncope, syncope, bleeding, or neurologic sequela. The patient is tolerating medications without difficulties and is otherwise without complaint today.    Past Medical History:  Diagnosis Date  . Arthritis    "probably in my knees" (10/25/2015)  . Asthma attack X 1 10/2015  . Atrial flutter ([place]) 10/2015  . Heart murmur   . Hepatitis 1972  . Hypothyroidism   . Malignant melanoma of leg ([place])    "right thigh"  . PAF (paroxysmal atrial fibrillation) ([place])   . Pneumonia 1966; 1967   "left lung collapsed one of these times"  . Psoriasis   . Rosacea   . Vitamin D deficiency    Past Surgical History:  Procedure Laterality Date  . CARDIOVERSION N/A 10/25/2015   Procedure: CARDIOVERSION;  Surgeon: Sueanne Margarita, MD;  Location: [facility];  Service: Cardiovascular;  Laterality: N/A;  . CERVICAL ABLATION  ~ 2012  . CESAREAN SECTION  1980  . CESAREAN SECTION WITH BILATERAL TUBAL LIGATION  1982  . MELANOMA EXCISION Right 10/1975   outer thigh "malignant"  . REFRACTIVE SURGERY Bilateral 1990s  . TEE WITHOUT CARDIOVERSION N/A 10/25/2015   Procedure: TRANSESOPHAGEAL ECHOCARDIOGRAM (TEE);  Surgeon: Sueanne Margarita, MD;  Location: [facility];  Service: Cardiovascular;  Laterality: N/A;  . TONSILLECTOMY  1967     Current Outpatient Prescriptions  Medication Sig Dispense Refill  . apixaban (ELIQUIS) 5 MG TABS tablet Take 1 tablet (5 mg total) by mouth 2 (two) times daily. 60 tablet 6  . augmented betamethasone dipropionate (DIPROLENE-AF) 0.05 % cream Apply 1 application topically daily as needed (Psoriasis).    . Calcium 600-400 MG-UNIT CHEW Chew 1 each by mouth 2 (two) times daily.     . carvedilol (COREG) 6.25 MG tablet Take 1 tablet (6.25 mg total) by mouth 2 (two) times daily with a meal. 60 tablet 3  . Cholecalciferol (VITAMIN D PO) Take 10,000 Units by mouth daily.     . Coenzyme Q10 (CO Q10) 100 MG CAPS Take 100 mg by mouth daily.     Marland Kitchen dofetilide (TIKOSYN) 250 MCG capsule Take 1 capsule (250 mcg total) by mouth 2 (two) times daily. 60 capsule 3  . furosemide (LASIX) 20 MG tablet Take 1 tablet (20 mg total) by mouth as needed (take 1 tablet daily as needed for 3-5lb weight gain, increased swelling). 30 tablet 2  . levothyroxine (SYNTHROID, LEVOTHROID) 50 MCG tablet TAKE 1 TABLET (50 MCG TOTAL) BY MOUTH DAILY. 90 tablet 1  . lisinopril (PRINIVIL,ZESTRIL) 5 MG tablet Take 1 tablet (5 mg total) by mouth daily. 30 tablet 3  . loratadine (CLARITIN) 10 MG tablet TAKE 1 TABLET BY MOUTH DAILY AS NEEDED FOR ALLERGY RELIEF  0  .  Magnesium 250 MG TABS Take 500 mg by mouth every evening.     . montelukast (SINGULAIR) 10 MG tablet TAKE 1 TABLET BY MOUTH EVERY DAY 90 tablet 1  . Multiple Vitamin (MULTIVITAMIN WITH MINERALS) TABS tablet Take 1 tablet by mouth daily.    . naphazoline-pheniramine (NAPHCON-A) 0.025-0.3 % ophthalmic solution Place 1 drop into both eyes every morning. Reported on 11/04/2015    . NONFORMULARY OR COMPOUNDED ITEM 4 (four) times a week. Estriol/testosterone 0.25-0.25 suppository compound. Take on Monday Tuesday Thursday Friday Use as directed    . NONFORMULARY OR COMPOUNDED ITEM  Place onto the skin daily. Biest 8/2 HRT compound  Use as directed    . Probiotic Product (PROBIOTIC DAILY PO) Take 1 tablet by mouth daily.    Marland Kitchen PROGESTERONE, VAGINAL, 4 % GEL Place onto the skin as directed. Days 1-25 of each month     . Turmeric 500 MG CAPS Take 500 mg by mouth 2 (two) times daily.     No current facility-administered medications for this visit.     Allergies:   Lipitor [atorvastatin]; Metoprolol; Rythmol [propafenone]; and Sulfa drugs cross reactors   Social History:  The patient  reports that she has never smoked. She has never used smokeless tobacco. She reports that she drinks about 2.4 oz of alcohol per week . She reports that she does not use drugs.   Family History:  The patient's family history includes Diabetes in her father; Heart attack in her brother and mother; Heart disease in her father and mother; Hypertension in her father and mother.    ROS:  Please see the history of present illness.   Otherwise, review of systems is positive for palpitations.   All other systems are reviewed and negative.    PHYSICAL EXAM: VS:  BP 128/70   Pulse 63   Ht 5\' 6"  (1.676 m)   Wt 223 lb (101.2 kg)   SpO2 97%   BMI 35.99 kg/m  , BMI Body mass index is 35.99 kg/m. GEN: Well nourished, well developed, in no acute distress  HEENT: normal  Neck: no JVD, carotid bruits, or masses Cardiac: RRR; no murmurs, rubs, or gallops,no edema  Respiratory:  clear to auscultation bilaterally, normal work of breathing GI: soft, nontender, nondistended, + BS MS: no deformity or atrophy  Skin: warm and dry Neuro:  Strength and sensation are intact Psych: euthymic mood, full affect  EKG:  EKG is not ordered today. Personal review of the ekg ordered 8/14 shows sinus rhythm, rate 48  Second ECG showing sinus rhythm with atrial runs  Recent Labs: 10/20/2015: ALT 22; B Natriuretic Peptide 209.7; Hemoglobin 12.9; Platelets 410; TSH 0.951 11/04/2015: BUN 12; Creatinine, Ser 0.83;  Magnesium 2.2; Potassium 4.2; Sodium 140    Lipid Panel     Component Value Date/Time   CHOL 195 06/23/2015 0921   TRIG 113 06/23/2015 0921   HDL 46 06/23/2015 0921   CHOLHDL 4.2 06/23/2015 0921   VLDL 23 06/23/2015 0921   LDLCALC 126 06/23/2015 0921     Wt Readings from Last 3 Encounters:  01/03/16 223 lb (101.2 kg)  11/28/15 214 lb 6.4 oz (97.3 kg)  11/04/15 213 lb 3.2 oz (96.7 kg)      Other studies Reviewed: Additional studies/ records that were reviewed today include: TTE 10/26/15  Review of the above records today demonstrates:  - Left ventricle: The cavity size was normal. Systolic function was   moderately reduced. The estimated ejection fraction was in  the   range of 35% to 40%. Diffuse hypokinesis. Features are consistent   with a pseudonormal left ventricular filling pattern, with   concomitant abnormal relaxation and increased filling pressure   (grade 2 diastolic dysfunction). Doppler parameters are   consistent with elevated ventricular end-diastolic filling   pressure. - Aortic valve: Trileaflet; normal thickness leaflets. There was no   regurgitation. - Aortic root: The aortic root was normal in size. - Ascending aorta: The ascending aorta was normal in size. - Mitral valve: There was mild regurgitation. - Left atrium: The atrium was mildly dilated. - Tricuspid valve: There was mild regurgitation. - Pulmonary arteries: Systolic pressure was mildly increased. PA   peak pressure: 38 mm Hg (S). - Inferior vena cava: The vessel was normal in size. - Pericardium, extracardiac: A trivial pericardial effusion was   identified posterior to the heart.   ASSESSMENT AND PLAN:  1.  Persistent atrial fibrillation: recent admission for tikosyn.  On Eliquis. She is continuing to have breakthrough symptoms of atrial fibrillation. AF ablation scheduled 12/05/15. Risks and benefits were discussed. Risks include bleeding, tamponade, heart block, stroke, and damage to  surrounding organs. She understands these risks and has agreed to the procedure. We Peityn Payton order her for preprocedure labs today.  This patients CHA2DS2-VASc Score and unadjusted Ischemic Stroke Rate (% per year) is equal to 3.2 % stroke rate/year from a score of 3  Above score calculated as 1 point each if present [CHF, HTN, DM, Vascular=MI/PAD/Aortic Plaque, Age if 65-74, or Female] Above score calculated as 2 points each if present [Age > 75, or Stroke/TIA/TE]  2. Hypertension: Well controlled today.  3. Snoring: Snores at night but unsure if she holds her breath. Jamauri Kruzel order a sleep study, as this may help to treat her atrial fibrillation. Current medicines are reviewed at length with the patient today.   The patient does not have concerns regarding her medicines.  The following changes were made today:  none  Labs/ tests ordered today include:  Orders Placed This Encounter  Procedures  . Split night study     Disposition:   FU with Gwendy Boeder 4 months  Signed, Anaija Wissink Meredith Leeds, MD  01/03/2016 8:39 AM     [facility] [address] [place] [address] [phone] (office) [phone] (fax)

## 2016-01-11 NOTE — Interval H&P Note (Signed)
History and Physical Interval Note:  01/11/2016 10:07 AM  Monica Morgan  has presented today for surgery, with the diagnosis of ABNORMAL CARDIAC CT, POSSIBLE CLOT  The various methods of treatment have been discussed with the patient and family. After consideration of risks, benefits and other options for treatment, the patient has consented to  Procedure(s): TRANSESOPHAGEAL ECHOCARDIOGRAM (TEE) (N/A) as a surgical intervention .  The patient's history has been reviewed, patient examined, no change in status, stable for surgery.  I have reviewed the patient's chart and labs.  Questions were answered to the patient's satisfaction.     Jenkins Rouge

## 2016-01-12 ENCOUNTER — Telehealth (HOSPITAL_COMMUNITY): Payer: Self-pay | Admitting: *Deleted

## 2016-01-12 ENCOUNTER — Encounter (HOSPITAL_COMMUNITY): Payer: Self-pay | Admitting: Cardiovascular Disease

## 2016-01-12 NOTE — Telephone Encounter (Signed)
-----   Message from [name] sent at 01/03/2016  8:39 AM EDT ----- Regarding: 4 week f/u with donna after 4/29 Contact: [phone] Please call pt for f/u appt.   Monica Morgan

## 2016-01-12 NOTE — Telephone Encounter (Signed)
LMOM for pt to clbk re 4 week f/u

## 2016-01-13 ENCOUNTER — Ambulatory Visit (HOSPITAL_COMMUNITY): Payer: 59 | Admitting: Certified Registered Nurse Anesthetist

## 2016-01-13 ENCOUNTER — Encounter (HOSPITAL_COMMUNITY)
Admission: RE | Disposition: A | Discharge status: Home / Self Care | Payer: Self-pay | Source: Ambulatory Visit | Attending: Cardiology

## 2016-01-13 ENCOUNTER — Other Ambulatory Visit: Payer: Self-pay | Admitting: Internal Medicine

## 2016-01-13 ENCOUNTER — Encounter (HOSPITAL_COMMUNITY): Payer: Self-pay | Admitting: Cardiology

## 2016-01-13 ENCOUNTER — Ambulatory Visit (HOSPITAL_COMMUNITY)
Admission: RE | Admit: 2016-01-13 | Discharge: 2016-01-14 | Disposition: A | Discharge status: Home / Self Care | Payer: 59 | Source: Ambulatory Visit | Attending: Cardiology | Admitting: Cardiology

## 2016-01-13 DIAGNOSIS — L409 Psoriasis, unspecified: Secondary | ICD-10-CM | POA: Insufficient documentation

## 2016-01-13 DIAGNOSIS — M1990 Unspecified osteoarthritis, unspecified site: Secondary | ICD-10-CM | POA: Insufficient documentation

## 2016-01-13 DIAGNOSIS — Z79899 Other long term (current) drug therapy: Secondary | ICD-10-CM | POA: Insufficient documentation

## 2016-01-13 DIAGNOSIS — Z7901 Long term (current) use of anticoagulants: Secondary | ICD-10-CM | POA: Diagnosis not present

## 2016-01-13 DIAGNOSIS — E785 Hyperlipidemia, unspecified: Secondary | ICD-10-CM | POA: Insufficient documentation

## 2016-01-13 DIAGNOSIS — I480 Paroxysmal atrial fibrillation: Secondary | ICD-10-CM | POA: Diagnosis not present

## 2016-01-13 DIAGNOSIS — I4891 Unspecified atrial fibrillation: Secondary | ICD-10-CM | POA: Diagnosis present

## 2016-01-13 DIAGNOSIS — I429 Cardiomyopathy, unspecified: Secondary | ICD-10-CM | POA: Diagnosis not present

## 2016-01-13 HISTORY — PX: ELECTROPHYSIOLOGIC STUDY: SHX172A

## 2016-01-13 LAB — POCT ACTIVATED CLOTTING TIME
Activated Clotting Time: 252 seconds
Activated Clotting Time: 274 seconds
Activated Clotting Time: 307 seconds
Activated Clotting Time: 296 seconds
Activated Clotting Time: 147 seconds

## 2016-01-13 LAB — MRSA PCR SCREENING: MRSA by PCR: NEGATIVE

## 2016-01-13 SURGERY — ATRIAL FIBRILLATION ABLATION
Anesthesia: General

## 2016-01-13 MED ORDER — CALCIUM 600-400 MG-UNIT PO CHEW: 1.0000 | CHEWABLE_TABLET | Freq: Two times a day (BID) | ORAL | Status: DC

## 2016-01-13 MED ORDER — VITAMIN D 1000 UNITS PO TABS: 10000.0000 [IU] | ORAL_TABLET | Freq: Every day | ORAL | Status: DC

## 2016-01-13 MED ORDER — TURMERIC 500 MG PO CAPS: 500.0000 mg | ORAL_CAPSULE | Freq: Two times a day (BID) | ORAL | Status: DC

## 2016-01-13 MED ORDER — SODIUM CHLORIDE 0.9% FLUSH: 3.0000 mL | INTRAVENOUS | Status: DC | PRN

## 2016-01-13 MED ORDER — CARVEDILOL 6.25 MG PO TABS
6.2500 mg | ORAL_TABLET | Freq: Two times a day (BID) | ORAL | Status: DC
  Administered 2016-01-13 – 2016-01-14 (×2): 6.25 mg via ORAL
  Filled 2016-01-13 (×2): qty 1

## 2016-01-13 MED ORDER — HEPARIN SODIUM (PORCINE) 1000 UNIT/ML IJ SOLN
INTRAMUSCULAR | Status: AC
  Filled 2016-01-13: qty 1

## 2016-01-13 MED ORDER — INFLUENZA VAC SPLIT QUAD 0.5 ML IM SUSY
0.5000 mL | PREFILLED_SYRINGE | INTRAMUSCULAR | Status: DC
  Filled 2016-01-13: qty 0.5

## 2016-01-13 MED ORDER — CO Q10 100 MG PO CAPS: 100.0000 mg | ORAL_CAPSULE | Freq: Every day | ORAL | Status: DC

## 2016-01-13 MED ORDER — SODIUM CHLORIDE 0.9 % IV SOLN
INTRAVENOUS | Status: DC | PRN
  Administered 2016-01-13 (×2): via INTRAVENOUS

## 2016-01-13 MED ORDER — HEPARIN SODIUM (PORCINE) 1000 UNIT/ML IJ SOLN
INTRAMUSCULAR | Status: DC | PRN
  Administered 2016-01-13: 1000 [IU] via INTRAVENOUS

## 2016-01-13 MED ORDER — CALCIUM CARBONATE-VITAMIN D 500-200 MG-UNIT PO TABS
1.0000 | ORAL_TABLET | Freq: Two times a day (BID) | ORAL | Status: DC
  Administered 2016-01-13 – 2016-01-14 (×2): 1 via ORAL
  Filled 2016-01-13 (×2): qty 1

## 2016-01-13 MED ORDER — FENTANYL CITRATE (PF) 100 MCG/2ML IJ SOLN
INTRAMUSCULAR | Status: DC | PRN
  Administered 2016-01-13 (×4): 50 ug via INTRAVENOUS
  Administered 2016-01-13: 100 ug via INTRAVENOUS

## 2016-01-13 MED ORDER — PHENYLEPHRINE HCL 10 MG/ML IJ SOLN
INTRAMUSCULAR | Status: DC | PRN
  Administered 2016-01-13 (×2): 80 ug via INTRAVENOUS

## 2016-01-13 MED ORDER — FUROSEMIDE 20 MG PO TABS: 20.0000 mg | ORAL_TABLET | ORAL | Status: DC | PRN

## 2016-01-13 MED ORDER — ONDANSETRON HCL 4 MG/2ML IJ SOLN
INTRAMUSCULAR | Status: DC | PRN
Start: 2016-01-13 — End: 2016-01-13
  Administered 2016-01-13: 4 mg via INTRAVENOUS

## 2016-01-13 MED ORDER — DOBUTAMINE IN D5W 4-5 MG/ML-% IV SOLN
INTRAVENOUS | Status: AC
  Filled 2016-01-13: qty 250

## 2016-01-13 MED ORDER — MAGNESIUM OXIDE 400 (241.3 MG) MG PO TABS
400.0000 mg | ORAL_TABLET | Freq: Every day | ORAL | Status: DC
  Administered 2016-01-13 – 2016-01-14 (×2): 400 mg via ORAL
  Filled 2016-01-13 (×2): qty 1

## 2016-01-13 MED ORDER — ONDANSETRON HCL 4 MG/2ML IJ SOLN: 4.0000 mg | Freq: Four times a day (QID) | INTRAMUSCULAR | Status: DC | PRN

## 2016-01-13 MED ORDER — DOFETILIDE 250 MCG PO CAPS
250.0000 ug | ORAL_CAPSULE | Freq: Two times a day (BID) | ORAL | Status: DC
  Administered 2016-01-13 – 2016-01-14 (×2): 250 ug via ORAL
  Filled 2016-01-13 (×2): qty 1

## 2016-01-13 MED ORDER — DOBUTAMINE IN D5W 4-5 MG/ML-% IV SOLN
INTRAVENOUS | Status: DC | PRN
  Administered 2016-01-13: 20 ug/kg/min via INTRAVENOUS

## 2016-01-13 MED ORDER — MAGNESIUM 200 MG PO TABS
400.0000 mg | ORAL_TABLET | Freq: Every evening | ORAL | Status: DC
  Filled 2016-01-13: qty 2

## 2016-01-13 MED ORDER — LIDOCAINE HCL (CARDIAC) 20 MG/ML IV SOLN
INTRAVENOUS | Status: DC | PRN
  Administered 2016-01-13: 100 mg via INTRAVENOUS

## 2016-01-13 MED ORDER — PROTAMINE SULFATE 10 MG/ML IV SOLN
INTRAVENOUS | Status: DC | PRN
  Administered 2016-01-13: 40 mg via INTRAVENOUS

## 2016-01-13 MED ORDER — VITAMIN D 1000 UNITS PO TABS
1000.0000 [IU] | ORAL_TABLET | Freq: Every day | ORAL | Status: DC
  Administered 2016-01-13 – 2016-01-14 (×2): 1000 [IU] via ORAL
  Filled 2016-01-13 (×2): qty 1

## 2016-01-13 MED ORDER — MONTELUKAST SODIUM 10 MG PO TABS
10.0000 mg | ORAL_TABLET | Freq: Every day | ORAL | Status: DC
  Administered 2016-01-13 – 2016-01-14 (×2): 10 mg via ORAL
  Filled 2016-01-13 (×2): qty 1

## 2016-01-13 MED ORDER — PHENYLEPHRINE HCL 10 MG/ML IJ SOLN
INTRAVENOUS | Status: DC | PRN
  Administered 2016-01-13: 20 ug/min via INTRAVENOUS

## 2016-01-13 MED ORDER — LEVOTHYROXINE SODIUM 50 MCG PO TABS
50.0000 ug | ORAL_TABLET | Freq: Every day | ORAL | Status: DC
  Administered 2016-01-14: 50 ug via ORAL
  Filled 2016-01-13: qty 1

## 2016-01-13 MED ORDER — SUCCINYLCHOLINE CHLORIDE 20 MG/ML IJ SOLN
INTRAMUSCULAR | Status: DC | PRN
  Administered 2016-01-13: 100 mg via INTRAVENOUS

## 2016-01-13 MED ORDER — POLYVINYL ALCOHOL 1.4 % OP SOLN
1.0000 [drp] | Freq: Every day | OPHTHALMIC | Status: DC
  Administered 2016-01-14: 1 [drp] via OPHTHALMIC
  Filled 2016-01-13: qty 15

## 2016-01-13 MED ORDER — SODIUM CHLORIDE 0.9% FLUSH
3.0000 mL | Freq: Two times a day (BID) | INTRAVENOUS | Status: DC
  Administered 2016-01-13 – 2016-01-14 (×2): 3 mL via INTRAVENOUS

## 2016-01-13 MED ORDER — PROPOFOL 10 MG/ML IV BOLUS
INTRAVENOUS | Status: DC | PRN
  Administered 2016-01-13: 170 mg via INTRAVENOUS

## 2016-01-13 MED ORDER — BUPIVACAINE HCL (PF) 0.25 % IJ SOLN
INTRAMUSCULAR | Status: DC | PRN
  Administered 2016-01-13: 45 mL

## 2016-01-13 MED ORDER — HEPARIN (PORCINE) IN NACL 2-0.9 UNIT/ML-% IJ SOLN
INTRAMUSCULAR | Status: DC | PRN
  Administered 2016-01-13: 16:00:00

## 2016-01-13 MED ORDER — POLYETHYL GLYCOL-PROPYL GLYCOL 0.4-0.3 % OP GEL: Freq: Every day | OPHTHALMIC | Status: DC

## 2016-01-13 MED ORDER — SODIUM CHLORIDE 0.9 % IV SOLN
250.0000 mL | INTRAVENOUS | Status: DC | PRN
Start: 2016-01-13 — End: 2016-01-14

## 2016-01-13 MED ORDER — ACETAMINOPHEN 325 MG PO TABS: 650.0000 mg | ORAL_TABLET | ORAL | Status: DC | PRN

## 2016-01-13 MED ORDER — LISINOPRIL 5 MG PO TABS
5.0000 mg | ORAL_TABLET | Freq: Every day | ORAL | Status: DC
  Administered 2016-01-13 – 2016-01-14 (×2): 5 mg via ORAL
  Filled 2016-01-13 (×2): qty 1

## 2016-01-13 MED ORDER — DEXAMETHASONE SODIUM PHOSPHATE 4 MG/ML IJ SOLN
INTRAMUSCULAR | Status: DC | PRN
  Administered 2016-01-13: 10 mg via INTRAVENOUS

## 2016-01-13 MED ORDER — ADULT MULTIVITAMIN W/MINERALS CH
1.0000 | ORAL_TABLET | Freq: Every day | ORAL | Status: DC
  Administered 2016-01-13 – 2016-01-14 (×2): 1 via ORAL
  Filled 2016-01-13 (×2): qty 1

## 2016-01-13 MED ORDER — MIDAZOLAM HCL 5 MG/5ML IJ SOLN
INTRAMUSCULAR | Status: DC | PRN
  Administered 2016-01-13: 2 mg via INTRAVENOUS

## 2016-01-13 MED ORDER — HEPARIN SODIUM (PORCINE) 1000 UNIT/ML IJ SOLN
INTRAMUSCULAR | Status: DC | PRN
  Administered 2016-01-13: 2 mL via INTRAVENOUS
  Administered 2016-01-13: 5 mL via INTRAVENOUS
  Administered 2016-01-13: 3 mL via INTRAVENOUS
  Administered 2016-01-13: 4 mL via INTRAVENOUS
  Administered 2016-01-13: 12 mL via INTRAVENOUS

## 2016-01-13 MED ORDER — BUPIVACAINE HCL (PF) 0.25 % IJ SOLN
INTRAMUSCULAR | Status: AC
  Filled 2016-01-13: qty 60

## 2016-01-13 MED ORDER — APIXABAN 5 MG PO TABS
5.0000 mg | ORAL_TABLET | Freq: Two times a day (BID) | ORAL | Status: DC
  Administered 2016-01-13 – 2016-01-14 (×2): 5 mg via ORAL
  Filled 2016-01-13 (×2): qty 1

## 2016-01-13 MED ORDER — LORATADINE 10 MG PO TABS
10.0000 mg | ORAL_TABLET | Freq: Every day | ORAL | Status: DC | PRN
  Administered 2016-01-13: 10 mg via ORAL
  Filled 2016-01-13: qty 1

## 2016-01-13 SURGICAL SUPPLY — 19 items
BAG SNAP BAND KOVER 36X36 (MISCELLANEOUS) ×2 IMPLANT
BLANKET WARM UNDERBOD FULL ACC (MISCELLANEOUS) ×2 IMPLANT
CATH SMTCH THERMOCOOL SF DF (CATHETERS) ×2 IMPLANT
CATH SOUNDSTAR 3D IMAGING (CATHETERS) ×2 IMPLANT
CATH VARIABLE LASSO NAV 2515 (CATHETERS) ×2 IMPLANT
CATH WEBSTER BI DIR CS D-F CRV (CATHETERS) ×2 IMPLANT
COVER SWIFTLINK CONNECTOR (BAG) ×2 IMPLANT
NEEDLE TRANSSEPTAL BRK 98CM (NEEDLE) ×2 IMPLANT
PACK EP LATEX FREE (CUSTOM PROCEDURE TRAY) ×1
PACK EP LF (CUSTOM PROCEDURE TRAY) ×1 IMPLANT
PAD DEFIB LIFELINK (PAD) ×2 IMPLANT
PATCH CARTO3 (PAD) ×2 IMPLANT
SHEATH AGILIS NXT 8.5F 71CM (SHEATH) ×4 IMPLANT
SHEATH AVANTI 11F 11CM (SHEATH) ×2 IMPLANT
SHEATH PINNACLE 7F 10CM (SHEATH) ×2 IMPLANT
SHEATH PINNACLE 8F 10CM (SHEATH) ×4 IMPLANT
SHEATH PINNACLE 9F 10CM (SHEATH) ×4 IMPLANT
SHIELD RADPAD SCOOP 12X17 (MISCELLANEOUS) ×2 IMPLANT
TUBING SMART ABLATE COOLFLOW (TUBING) ×2 IMPLANT

## 2016-01-13 NOTE — Anesthesia Procedure Notes (Signed)
Procedure Name: Intubation Date/Time: 01/13/2016 12:44 PM Performed by: Ollen Bowl Pre-anesthesia Checklist: Patient identified, Emergency Drugs available, Suction available, Patient being monitored and Timeout performed Patient Re-evaluated:Patient Re-evaluated prior to inductionOxygen Delivery Method: Circle system utilized and Simple face mask Preoxygenation: Pre-oxygenation with 100% oxygen Intubation Type: IV induction Ventilation: Mask ventilation without difficulty Laryngoscope Size: Miller and 2 Grade View: Grade II Tube type: Oral Tube size: 7.5 mm Number of attempts: 1 Airway Equipment and Method: Patient positioned with wedge pillow and Stylet Placement Confirmation: ETT inserted through vocal cords under direct vision,  positive ETCO2 and breath sounds checked- equal and bilateral Secured at: 21 cm Tube secured with: Tape Dental Injury: Teeth and Oropharynx as per pre-operative assessment

## 2016-01-13 NOTE — Progress Notes (Signed)
PHARMACIST - PHYSICIAN ORDER COMMUNICATION  CONCERNING: P&T Medication Policy on Herbal Medications  DESCRIPTION:  This patient's order for:  CoQ10 and Turmeric  has been noted.  This product(s) is classified as an "herbal" or natural product. Due to a lack of definitive safety studies or FDA approval, nonstandard manufacturing practices, plus the potential risk of unknown drug-drug interactions while on inpatient medications, the Pharmacy and Therapeutics Committee does not permit the use of "herbal" or natural products of this type within [facility].   ACTION TAKEN: The pharmacy department is unable to verify this order at this time and your patient has been informed of this safety policy. Please reevaluate patient's clinical condition at discharge and address if the herbal or natural product(s) should be resumed at that time.

## 2016-01-13 NOTE — Anesthesia Preprocedure Evaluation (Signed)
Anesthesia Evaluation  Patient identified by MRN, date of birth, ID band Patient awake    Reviewed: Allergy & Precautions, NPO status , Patient's Chart, lab work & pertinent test results  Airway Mallampati: II       Dental   Pulmonary asthma , pneumonia,    breath sounds clear to auscultation       Cardiovascular negative cardio ROS  + Valvular Problems/Murmurs  Rhythm:Regular Rate:Normal     Neuro/Psych    GI/Hepatic negative GI ROS, (+) Hepatitis -  Endo/Other  Hypothyroidism   Renal/GU      Musculoskeletal  (+) Arthritis ,   Abdominal   Peds  Hematology   Anesthesia Other Findings   Reproductive/Obstetrics                             Anesthesia Physical Anesthesia Plan  ASA: III  Anesthesia Plan: General   Post-op Pain Management:    Induction: Intravenous  Airway Management Planned: Oral ETT  Additional Equipment:   Intra-op Plan:   Post-operative Plan: Extubation in OR  Informed Consent: I have reviewed the patients History and Physical, chart, labs and discussed the procedure including the risks, benefits and alternatives for the proposed anesthesia with the patient or authorized representative who has indicated his/her understanding and acceptance.   Dental advisory given  Plan Discussed with: CRNA and Anesthesiologist  Anesthesia Plan Comments:         Anesthesia Quick Evaluation

## 2016-01-13 NOTE — Discharge Instructions (Signed)
No driving for 1 week. No lifting over 5 lbs for 1 week. No vigorous or sexual activity for 1 week. You may return to work on 01/20/16. Keep procedure site clean & dry. If you notice increased pain, swelling, bleeding or pus, call/return!  You may shower, but no soaking baths/hot tubs/pools for 1 week.   You have an appointment set up with the [facility].  Multiple studies have shown that being followed by a dedicated atrial fibrillation clinic in addition to the standard care you receive from your other physicians improves health. We believe that enrollment in the atrial fibrillation clinic will allow [place] to better care for you.   The phone number to the [facility] is [phone]. The clinic is staffed Monday through Friday from 8:30am to 5pm.  Parking Directions: The clinic is located in the Heart and Vascular Building connected to [facility]. 1)From [address] turn on to [address] and go to the 3rd entrance  (Heart and Vascular entrance) on the right. 2)Look to the right for Heart &Vascular Parking Garage. 3)A code for the entrance is required please call the clinic to receive this.   4)Take the elevators to the 1st floor. Registration is in the room with the glass walls at the end of the hallway.  If you have any trouble parking or locating the clinic, please dont hesitate to call [phone].

## 2016-01-13 NOTE — H&P (Signed)
Monica Morgan is a 55 y.o. female with a history of atrial fibrillation.  She is on tikosyn and still having breakthrough episodes.  On exam, irregular rhythm, no murmurs, lungs clear.  Plan for ablation of atrial fibrillation today.  Risks and benefits discussed.  Risks include but are not limited to bleeding, tamponade, heart block, stroke, damage to surrounding organs.  She understands these risks and has agreed to the procedure.  Rayvin Abid Curt Bears, MD 01/13/2016 11:47 AM

## 2016-01-13 NOTE — Transfer of Care (Signed)
Immediate Anesthesia Transfer of Care Note  Patient: Monica Morgan  Procedure(s) Performed: Procedure(s): Atrial Fibrillation Ablation (N/A)  Patient Location: PACU and Cath Lab  Anesthesia Type:General  Level of Consciousness: awake, alert  and oriented  Airway & Oxygen Therapy: Patient Spontanous Breathing and Patient connected to nasal cannula oxygen  Post-op Assessment: Report given to RN, Post -op Vital signs reviewed and stable and Patient moving all extremities X 4  Post vital signs: Reviewed and stable  Last Vitals:  Vitals:   01/13/16 1645 01/13/16 1650  BP: (!) 141/56 (!) 129/57  Pulse:  (!) 58  Resp:  12  Temp: 36.5 C     Last Pain: There were no vitals filed for this visit.       Complications: No apparent anesthesia complications

## 2016-01-13 NOTE — Discharge Summary (Signed)
ELECTROPHYSIOLOGY PROCEDURE DISCHARGE SUMMARY    Patient ID: Monica Morgan,  MRN: [id number], DOB/AGE: 09-11-1960 55 y.o.  Admit date: 01/13/2016 Discharge date:01/14/16  Primary Care Physician: Alesia Richards, MD Primary Cardiologist: Dr. Radford Pax Electrophysiologist: Dr. Curt Bears  Primary Discharge Diagnosis:  1. Paroxysmal AFib      CHA2DS2Vasc is 2 on Eliquis  Secondary Discharge Diagnosis:  1. NICM 2. HLD 3. Psoriasis    Procedures This Admission:  1.  Electrophysiology study and radiofrequency catheter ablation on 01/13/16 by Dr Curt Bears.   CONCLUSIONS: 1. Sinus rhythm upon presentation.   2. Successful electrical isolation and anatomical encircling of all four pulmonary veins with radiofrequency current. 3. No inducible arrhythmias following ablation both on and off of dobutamine 4. No early apparent complications.  Brief HPI: Monica Morgan is a 55 y.o. female with a history of paroxysmal atrial fibrillation.  They have failed medical therapy with Multaq and Tikosyn. Risks, benefits, and alternatives to catheter ablation of atrial fibrillation were reviewed with the patient who wished to proceed.  The patient underwent TEE prior to the procedure which demonstrated no LAA thrombus.    Hospital Course:  The patient was admitted and underwent EPS/RFCA of atrial fibrillation with details as outlined above.  They were monitored on telemetry overnight which demonstrated sinus rhythm.  Groin was without complication on the day of discharge.  The patient was examined by [facility] and considered to be stable for discharge.Wound care and restrictions were reviewed with the patient.  The patient Monica Morgan be seen back by Roderic Palau, NP in 4 weeks and Dr Curt Bears in 12 weeks for post ablation follow up.     Physical Exam: Vitals:   01/13/16 2354 01/14/16 0000 01/14/16 0408 01/14/16 0752  BP:  (!) 129/54 (!) 115/47 137/63  Pulse:   62   Resp:   18 18  Temp: 97.8 F (36.6  C)  98.2 F (36.8 C) 98.2 F (36.8 C)  TempSrc: Oral  Oral Oral  SpO2:   97%   Weight:      Height:         GEN- The patient is well appearing, alert and oriented x 3 today.   HEENT: normocephalic, atraumatic; sclera clear, conjunctiva pink; hearing intact; oropharynx clear; neck supple  Lungs- Clear to ausculation bilaterally, normal work of breathing.  No wheezes, rales, rhonchi Heart- Regular rate and rhythm, no murmurs, rubs or gallops  GI- soft, non-tender, non-distended, bowel sounds present  Extremities- no clubbing, cyanosis, or edema; groin without hematoma/bruit MS- no significant deformity or atrophy Skin- warm and dry, no rash or lesion Psych- euthymic mood, full affect Neuro- strength and sensation are intact   Labs:   Lab Results  Component Value Date   WBC 7.9 01/03/2016   HGB 12.8 01/03/2016   HCT 38.0 01/03/2016   MCV 85.4 01/03/2016   PLT 259 01/03/2016     Recent Labs Lab 01/14/16 0533  NA 139  K 4.4  CL 108  CO2 25  BUN 12  CREATININE 0.84  CALCIUM 9.0  GLUCOSE 120*     Discharge Medications:    Medication List    TAKE these medications   apixaban 5 MG Tabs tablet Commonly known as:  ELIQUIS Take 1 tablet (5 mg total) by mouth 2 (two) times daily.   augmented betamethasone dipropionate 0.05 % cream Commonly known as:  DIPROLENE-AF Apply 1 application topically daily as needed (Psoriasis).   Calcium 600-400 MG-UNIT Chew Chew 1 each by mouth 2 (two)  times daily.   carvedilol 6.25 MG tablet Commonly known as:  COREG Take 1 tablet (6.25 mg total) by mouth 2 (two) times daily with a meal.   Co Q10 100 MG Caps Take 100 mg by mouth daily.   dofetilide 250 MCG capsule Commonly known as:  TIKOSYN Take 1 capsule (250 mcg total) by mouth 2 (two) times daily.   furosemide 20 MG tablet Commonly known as:  LASIX Take 1 tablet (20 mg total) by mouth as needed (take 1 tablet daily as needed for 3-5lb weight gain, increased swelling).     levothyroxine 50 MCG tablet Commonly known as:  SYNTHROID, LEVOTHROID TAKE 1 TABLET (50 MCG TOTAL) BY MOUTH DAILY.   lisinopril 5 MG tablet Commonly known as:  PRINIVIL,ZESTRIL Take 1 tablet (5 mg total) by mouth daily.   loratadine 10 MG tablet Commonly known as:  CLARITIN TAKE 1 TABLET BY MOUTH DAILY AS NEEDED FOR ALLERGY RELIEF   Magnesium 250 MG Tabs Take 500 mg by mouth every evening.   montelukast 10 MG tablet Commonly known as:  SINGULAIR TAKE 1 TABLET BY MOUTH EVERY DAY What changed:  See the new instructions.   multivitamin with minerals Tabs tablet Take 1 tablet by mouth daily.   NONFORMULARY OR COMPOUNDED ITEM Apply 1 each topically See admin instructions. Progesterone 8%/48ml Apply 0.42mls topically every day except Saturday.   NONFORMULARY OR COMPOUNDED ITEM Place 1 each vaginally See admin instructions. Estriol/testosterone 0.25-0.25 vaginal suppository compound. Insert vaginally on Monday Tuesday Thursday Friday   NONFORMULARY OR COMPOUNDED ITEM Place 1 application onto the skin See admin instructions. Biest 8/2 HRT compound  Apply topically every day except Saturday.   PROBIOTIC DAILY PO Take 1 tablet by mouth daily.   SYSTANE OP Place 1 drop into both eyes daily.   Turmeric 500 MG Caps Take 500 mg by mouth 2 (two) times daily.   VITAMIN D PO Take 10,000 Units by mouth daily.       Disposition:  Home  Discharge Instructions    Diet - low sodium heart healthy    Complete by:  As directed    Increase activity slowly    Complete by:  As directed      Follow-up Information    [facility] Follow up on 02/10/2016.   Specialty:  Cardiology Why:  8:30AM  Contact information: [address] [id number] [place] [phone]       Runell Kovich Meredith Leeds, MD Follow up on 04/13/2016.   Specialty:  Cardiology Why:  8:00AM  Contact information: [place]  [phone]           Duration of Discharge Encounter: Greater than 30 minutes including physician time.  Jarrett Soho, [facility] 01/14/2016 9:20 AM  I have seen and examined this patient with Vin Bhagat.  Agree with above, note added to reflect my findings.  On exam, regular rhythm, no murmurs, lungs clear.  Had AF ablation yesterday without issues.  Sinus rhythm overnight.  Plan for discharge today with follow up in AF clinic in 1 month.    Rayson Rando M. Meila Berke MD 01/14/2016 9:30 AM

## 2016-01-13 NOTE — Progress Notes (Signed)
Site area:Left groin a 7 french and 11 french venous sheath was removed  Site Prior to Removal:  Level 0  Pressure Applied For 15 MINUTES    Bedrest Beginning at 1745p  Manual:   Yes.    Patient Status During Pull:  stable  Post Pull Groin Site:  Level 0  Post Pull Instructions Given:  Yes.    Post Pull Pulses Present:  Yes.    Dressing Applied:  Yes.    Comments:  VS remain stable during sheath pull.

## 2016-01-13 NOTE — Progress Notes (Signed)
Site area: Right  Groin 8 french venous sheath X2 was removed  Site Prior to Removal:  Level 0  Pressure Applied For 15 MINUTES    Minutes Beginning at 1710p  Manual:   Yes.    Patient Status During Pull:  stable  Post Pull Groin Site:  Level 0  Post Pull Instructions Given:  Yes.    Post Pull Pulses Present:  Yes.    Dressing Applied:  Yes.    Comments:  VS remain stable during sheath pull.

## 2016-01-14 DIAGNOSIS — L409 Psoriasis, unspecified: Secondary | ICD-10-CM | POA: Diagnosis not present

## 2016-01-14 DIAGNOSIS — I480 Paroxysmal atrial fibrillation: Secondary | ICD-10-CM | POA: Diagnosis not present

## 2016-01-14 DIAGNOSIS — E785 Hyperlipidemia, unspecified: Secondary | ICD-10-CM | POA: Diagnosis not present

## 2016-01-14 DIAGNOSIS — I429 Cardiomyopathy, unspecified: Secondary | ICD-10-CM | POA: Diagnosis not present

## 2016-01-14 LAB — BASIC METABOLIC PANEL
Anion gap: 6 (ref 5–15)
BUN: 12 mg/dL (ref 6–20)
CO2: 25 mmol/L (ref 22–32)
Calcium: 9 mg/dL (ref 8.9–10.3)
Chloride: 108 mmol/L (ref 101–111)
Creatinine, Ser: 0.84 mg/dL (ref 0.44–1.00)
GFR calc Af Amer: >60 mL/min (ref >60)
GFR calc non Af Amer: >60 mL/min (ref >60)
Glucose, Bld: 120 mg/dL — ABNORMAL HIGH (ref 65–99)
Potassium: 4.4 mmol/L (ref 3.5–5.1)
Sodium: 139 mmol/L (ref 135–145)

## 2016-01-14 LAB — MAGNESIUM: Magnesium: 2.2 mg/dL (ref 1.7–2.4)

## 2016-01-14 NOTE — Progress Notes (Signed)
Order for discharge noted, pt informed and verbalized understanding. Discharge instructions given to pt, pt verbalized understanding. R/L groin sites drsg removed and band aid placed. L hand iv access also removed and pressure held to site for 4 min. Pressure drsg applied to site. Elink and CCMD both notified re discharge. Pt transported to waiting car via wheelchair with belongings.

## 2016-01-18 ENCOUNTER — Telehealth: Payer: Self-pay | Admitting: Cardiology

## 2016-01-18 ENCOUNTER — Encounter: Payer: Self-pay | Admitting: *Deleted

## 2016-01-18 ENCOUNTER — Encounter: Payer: Self-pay | Admitting: Internal Medicine

## 2016-01-18 NOTE — Telephone Encounter (Signed)
Patient needs a RTW note to return tomorrow 01/19/16. Please call back ASAP.

## 2016-01-18 NOTE — Telephone Encounter (Signed)
Informed patient that I would send letter, to RTW tomorrow, through [facility]. Also faxed to [facility], human resources, [phone]  Pt thanks me for helping.

## 2016-01-24 NOTE — Anesthesia Postprocedure Evaluation (Signed)
Anesthesia Post Note  Patient: Monica Morgan  Procedure(s) Performed: Procedure(s) (LRB): Atrial Fibrillation Ablation (N/A)  Patient location during evaluation: PACU Anesthesia Type: General Level of consciousness: awake Pain management: pain level controlled Vital Signs Assessment: post-procedure vital signs reviewed and stable Respiratory status: spontaneous breathing Cardiovascular status: stable Anesthetic complications: no    Last Vitals:  Vitals:   01/14/16 0752 01/14/16 1032  BP: 137/63 (!) 136/55  Pulse:    Resp: 18   Temp: 36.8 C     Last Pain:  Vitals:   01/14/16 0752  TempSrc: Oral                 EDWARDS,Godfrey Tritschler

## 2016-01-26 ENCOUNTER — Encounter: Payer: Self-pay | Admitting: Internal Medicine

## 2016-01-26 ENCOUNTER — Ambulatory Visit (INDEPENDENT_AMBULATORY_CARE_PROVIDER_SITE_OTHER): Payer: 59 | Admitting: Internal Medicine

## 2016-01-26 VITALS — BP 140/76 | HR 64 | Temp 97.3°F | Resp 16 | Ht 66.0 in | Wt 222.8 lb

## 2016-01-26 DIAGNOSIS — E559 Vitamin D deficiency, unspecified: Secondary | ICD-10-CM | POA: Diagnosis not present

## 2016-01-26 DIAGNOSIS — E039 Hypothyroidism, unspecified: Secondary | ICD-10-CM | POA: Diagnosis not present

## 2016-01-26 DIAGNOSIS — R7309 Other abnormal glucose: Secondary | ICD-10-CM

## 2016-01-26 DIAGNOSIS — I10 Essential (primary) hypertension: Secondary | ICD-10-CM

## 2016-01-26 DIAGNOSIS — Z79899 Other long term (current) drug therapy: Secondary | ICD-10-CM

## 2016-01-26 DIAGNOSIS — E782 Mixed hyperlipidemia: Secondary | ICD-10-CM | POA: Diagnosis not present

## 2016-01-26 LAB — LIPID PANEL
Cholesterol: 215 mg/dL — ABNORMAL HIGH (ref 125–200)
HDL: 42 mg/dL — ABNORMAL LOW (ref >=46)
LDL Cholesterol: 128 mg/dL (ref <130)
Total CHOL/HDL Ratio: 5.1 Ratio — ABNORMAL HIGH (ref <=5.0)
Triglycerides: 226 mg/dL — ABNORMAL HIGH (ref <150)
VLDL: 45 mg/dL — ABNORMAL HIGH (ref <30)

## 2016-01-26 LAB — CBC WITH DIFFERENTIAL/PLATELET
Basophils Absolute: 0 cells/uL (ref 0–200)
Basophils Relative: 0 %
Eosinophils Absolute: 292 cells/uL (ref 15–500)
Eosinophils Relative: 2 %
HCT: 41.2 % (ref 35.0–45.0)
Hemoglobin: 13.7 g/dL (ref 11.7–15.5)
Lymphocytes Relative: 27 %
Lymphs Abs: 3942 cells/uL — ABNORMAL HIGH (ref 850–3900)
MCH: 28.8 pg (ref 27.0–33.0)
MCHC: 33.3 g/dL (ref 32.0–36.0)
MCV: 86.7 fL (ref 80.0–100.0)
MPV: 9.5 fL (ref 7.5–12.5)
Monocytes Absolute: 876 cells/uL (ref 200–950)
Monocytes Relative: 6 %
Neutro Abs: 9490 cells/uL — ABNORMAL HIGH (ref 1500–7800)
Neutrophils Relative %: 65 %
Platelets: 315 10*3/uL (ref 140–400)
RBC: 4.75 MIL/uL (ref 3.80–5.10)
RDW: 14.7 % (ref 11.0–15.0)
WBC: 14.6 10*3/uL — ABNORMAL HIGH (ref 3.8–10.8)

## 2016-01-26 LAB — TSH: TSH: 1.39 mIU/L

## 2016-01-26 NOTE — Progress Notes (Signed)
[facility] Unk Pinto, M.D.        Uvaldo Bristle. Silverio Lay, P.A.-C       Starlyn Skeans, P.A.-C  [facility]                [address] [place] [id number] Telephone [phone] Telefax [phone] ______________________________________________________________________     This very nice 55 y.o. DWF presents for follow up with Hypertension, Hyperlipidemia, Pre-Diabetes and Vitamin D Deficiency. Patient also has long standing hx/o refractory pAfib (CHA2DS2VASC)with with breakthru as well as intolerance to antiarrhythmic meds (Rhymothol, Metoprolol  and Multaq) and on 09.29.2017 underwent RF Ablation by Dr Curt Bears. Patient denies any palpitations since her procedure.       Patient has hx/o labile HTN predating circa 2013 & BP has been controlled at home. Today's BP is 140/76. Patient has had no complaints of any cardiac type chest pain, palpitations, dyspnea/orthopnea/PND, dizziness, claudication, or dependent edema.     Patient alleges intolerance to Atorvastatin and hyperlipidemia is not controlled with diet & meds.  Last Lipids were not at goal: Lab Results  Component Value Date   CHOL 195 06/23/2015   HDL 46 06/23/2015   LDLCALC 126 06/23/2015   TRIG 113 06/23/2015   CHOLHDL 4.2 06/23/2015      Also, the patient has history of Morbid Obesity  (BMI 36) and is expectantly monitored for PreDiabetes and has had no symptoms of reactive hypoglycemia, diabetic polys, paresthesias or visual blurring.  Last A1c was at goal: Lab Results  Component Value Date   HGBA1C 5.6 12/22/2014      Patient was started on Thyroid replacement in Nov 2014 for borderline occult Hypothyroidism. Further, the patient also has history of Vitamin D Deficiency and supplements vitamin D without any suspected side-effects. Last vitamin D was at goal: Lab Results  Component Value Date   VD25OH 62 12/22/2014   Current  Outpatient Prescriptions on File Prior to Visit  Medication Sig  . apixaban / ELIQUIS) 5 MG  Take 1 tab 2  times daily.  Marland Kitchen DIPROLENE-AF 0.05 % cream Apply 1 application topically daily as needed (Psoriasis).  . Calcium 600-400 MG Chew 1  2  times daily.   . carvedilol  6.25 MG tablet Take 1 tab 2  times daily   . VITAMIN D Take 10,000 Units  daily.   . CO Q10 100 MG Take  daily.   Marland Kitchen dofetilide  250 MCG  Take 1 cap 2  times daily.  . Furosemide  20 MG tablet Take 1 tab as needed  . levothyroxine  50 MCG tablet TAKE 1 TAB DAILY.  Marland Kitchen lisinopril  5 MG tablet Take 1 tab daily.  Marland Kitchen loratadine  10 MG tablet TAKE 1 TAB DAILY AS NEEDED FOR ALLERG  . Magnesium 250 MG TABS Take 500 mg  every evening.   . montelukast  10 MG tablet TAKE 1 TAB EVERY DAY   . Multi-Vit w/Min Take 1 tablet by mouth daily.  . Estriol/testosterone 0.25-0.25 vag supp  Insert vaginally on MTuThF  . NONFORMULARY OR COMPOUNDED ITEM Place 1 application onto the skin  Biest 8/2 HRT comp'd  Apply topically every day except Saturday.  Carren Rang  Place 1 drop into both eyes daily.  Marland Kitchen PROBIOTIC  PO) Take 1 tablet  daily.  . Turmeric 500 MG CAPS Take 500 mg  2  times daily.   Allergies  Allergen Reactions  . Lipitor [Atorvastatin] Other (See Comments)    "neck pain"  . Metoprolol Other (See Comments)    DIDN'T WORK FOR PATIENT AND GAINED WEIGHT MIGHT HAVE BEEN TAKEN WITH RYTHMOL  . Rythmol [Propafenone] Other (See Comments)    "weight gain"  . Sulfa Drugs Cross Reactors Other (See Comments)    "soreness all over"   PMHx:   Past Medical History:  Diagnosis Date  . Arthritis    "probably in my knees" (10/25/2015)  . Asthma attack X 1 10/2015  . Atrial flutter ([place]) 10/2015  . Heart murmur   . Hepatitis 1972  . Hypothyroidism   . Malignant melanoma of leg ([place])    "right thigh"  . PAF (paroxysmal atrial fibrillation) ([place])   . Pneumonia 1966; 1967   "left lung collapsed one of these times"  . Psoriasis   . Rosacea   .  Vitamin D deficiency    Immunization History  Administered Date(s) Administered  . PPD Test 12/07/2013, 12/22/2014  . Tdap 06/23/2015   Past Surgical History:  Procedure Laterality Date  . CARDIOVERSION N/A 10/25/2015   Procedure: CARDIOVERSION;  Surgeon: Sueanne Margarita, MD;  Location: [facility];  Service: Cardiovascular;  Laterality: N/A;  . CERVICAL ABLATION  ~ 2012  . CESAREAN SECTION  1980  . CESAREAN SECTION WITH BILATERAL TUBAL LIGATION  1982  . ELECTROPHYSIOLOGIC STUDY N/A 01/13/2016   Procedure: Atrial Fibrillation Ablation;  Surgeon: Will Meredith Leeds, MD;  Location: [facility] CV [facility];  Service: Cardiovascular;  Laterality: N/A;  . MELANOMA EXCISION Right 10/1975   outer thigh "malignant"  . REFRACTIVE SURGERY Bilateral 1990s  . TEE WITHOUT CARDIOVERSION N/A 10/25/2015   Procedure: TRANSESOPHAGEAL ECHOCARDIOGRAM (TEE);  Surgeon: Sueanne Margarita, MD;  Location[facility] END[facility]COPY;  Service: Cardiovascular;  Laterality: N/A;  . TEE WITHOUT CARDIOVERSION N/A 01/11/2016   Procedure: TRANSESOPHAGEAL ECHOCARDIOGRAM (TEE);  Surgeon: Josue Hector, MD;  Location[facility] END[facility]COPY;  Service: Cardiovascular;  Laterality: N/A;  . TONSILLECTOMY  1967   FHx:    Reviewed / unchanged  SHx:    Reviewed / unchanged  Systems Review:  Constitutional: Denies fever, chills, wt changes, headaches, insomnia, fatigue, night sweats, change in appetite. Eyes: Denies redness, blurred vision, diplopia, discharge, itchy, watery eyes.  ENT: Denies discharge, congestion, post nasal drip, epistaxis, sore throat, earache, hearing loss, dental pain, tinnitus, vertigo, sinus pain, snoring.  CV: Denies chest pain, palpitations, irregular heartbeat, syncope, dyspnea, diaphoresis, orthopnea, PND, claudication or edema. Respiratory: denies cough, dyspnea, DOE, pleurisy, hoarseness, laryngitis, wheezing.  Gastrointestinal: Denies dysphagia, odynophagia, heartburn, reflux, water brash, abdominal pain or cramps, nausea,  vomiting, bloating, diarrhea, constipation, hematemesis, melena, hematochezia  or hemorrhoids. Genitourinary: Denies dysuria, frequency, urgency, nocturia, hesitancy, discharge, hematuria or flank pain. Musculoskeletal: Denies arthralgias, myalgias, stiffness, jt. swelling, pain, limping or strain/sprain.  Skin: Denies pruritus, rash, hives, warts, acne, eczema or change in skin lesion(s). Neuro: No weakness, tremor, incoordination, spasms, paresthesia or pain. Psychiatric: Denies confusion, memory loss or sensory loss. Endo: Denies change in weight, skin or hair change.  Heme/Lymph: No excessive bleeding, bruising or enlarged lymph nodes.  Physical Exam BP 140/76   Pulse 64   Temp 97.3 F (36.3 C)   Resp 16   Ht 5\' 6"  (1.676 m)   Wt 222 lb 12.8 oz (101.1 kg)   LMP 10/29/2013 (Approximate)   BMI 35.96 kg/m   Appears well nourished and in no distress.  Eyes: PERRLA, EOMs, conjunctiva  no swelling or erythema. Sinuses: No frontal/maxillary tenderness ENT/Mouth: EAC's clear, TM's nl w/o erythema, bulging. Nares clear w/o erythema, swelling, exudates. Oropharynx clear without erythema or exudates. Oral hygiene is good. Tongue normal, non obstructing. Hearing intact.  Neck: Supple. Thyroid nl. Car 2+/2+ without bruits, nodes or JVD. Chest: Respirations nl with BS clear & equal w/o rales, rhonchi, wheezing or stridor.  Cor: Heart sounds normal w/ regular rate and rhythm without sig. murmurs, gallops, clicks, or rubs. Peripheral pulses normal and equal  without edema.  Abdomen: Soft & bowel sounds normal. Non-tender w/o guarding, rebound, hernias, masses, or organomegaly.  Lymphatics: Unremarkable.  Musculoskeletal: Full ROM all peripheral extremities, joint stability, 5/5 strength, and normal gait.  Skin: Warm, dry without exposed rashes, lesions or ecchymosis apparent.  Neuro: Cranial nerves intact, reflexes equal bilaterally. Sensory-motor testing grossly intact. Tendon reflexes grossly  intact.  Pysch: Alert & oriented x 3.  Insight and judgement nl & appropriate. No ideations.  Assessment and Plan:  1. Essential hypertension  - Continue medication, monitor blood pressure at home. Continue DASH diet. Reminder to go to the ER if any CP, SOB, nausea, dizziness, severe HA, changes vision/speech, left arm numbness and tingling and jaw pain. - TSH  2. Mixed hyperlipidemia  - Continue diet/meds, exercise,& lifestyle modifications. Continue monitor periodic cholesterol/liver & renal functions  - Lipid panel - TSH  3. Abnormal glucose  - Continue diet, exercise, lifestyle modifications. Monitor appropriate labs - Hemoglobin A1c - Insulin, random  4. Vitamin D deficiency  - Continue supplementation. - VITAMIN D 25 Hydroxy   5. Hypothyroidism  - TSH  6. Medication management  - CBC with Differential/Platelet       Recommended regular exercise, BP monitoring, weight control, and discussed med and SE's. Recommended labs to assess and monitor clinical status. Further disposition pending results of labs. Over 30 minutes of exam, counseling, chart review was performed

## 2016-01-26 NOTE — Patient Instructions (Signed)

## 2016-01-27 ENCOUNTER — Encounter (HOSPITAL_COMMUNITY): Payer: Self-pay | Admitting: *Deleted

## 2016-01-27 LAB — HEMOGLOBIN A1C
Hgb A1c MFr Bld: 5.5 % (ref <5.7)
Mean Plasma Glucose: 111 mg/dL

## 2016-01-27 LAB — INSULIN, RANDOM: Insulin: 15.2 u[IU]/mL (ref 2.0–19.6)

## 2016-01-27 LAB — VITAMIN D 25 HYDROXY (VIT D DEFICIENCY, FRACTURES): Vit D, 25-Hydroxy: 60 ng/mL (ref 30–100)

## 2016-01-28 ENCOUNTER — Other Ambulatory Visit: Payer: Self-pay | Admitting: Physician Assistant

## 2016-01-30 ENCOUNTER — Encounter: Payer: Self-pay | Admitting: Cardiology

## 2016-02-10 ENCOUNTER — Ambulatory Visit (HOSPITAL_COMMUNITY)
Admission: RE | Admit: 2016-02-10 | Discharge: 2016-02-10 | Disposition: A | Discharge status: Home / Self Care | Payer: 59 | Source: Ambulatory Visit | Attending: Nurse Practitioner | Admitting: Nurse Practitioner

## 2016-02-10 VITALS — BP 126/76 | HR 60 | Ht 66.0 in | Wt 226.8 lb

## 2016-02-10 DIAGNOSIS — M1990 Unspecified osteoarthritis, unspecified site: Secondary | ICD-10-CM | POA: Diagnosis not present

## 2016-02-10 DIAGNOSIS — Z823 Family history of stroke: Secondary | ICD-10-CM | POA: Insufficient documentation

## 2016-02-10 DIAGNOSIS — I4819 Other persistent atrial fibrillation: Secondary | ICD-10-CM

## 2016-02-10 DIAGNOSIS — Z800 Family history of malignant neoplasm of digestive organs: Secondary | ICD-10-CM | POA: Diagnosis not present

## 2016-02-10 DIAGNOSIS — I480 Paroxysmal atrial fibrillation: Secondary | ICD-10-CM | POA: Insufficient documentation

## 2016-02-10 DIAGNOSIS — Z8679 Personal history of other diseases of the circulatory system: Secondary | ICD-10-CM

## 2016-02-10 DIAGNOSIS — Z888 Allergy status to other drugs, medicaments and biological substances status: Secondary | ICD-10-CM | POA: Diagnosis not present

## 2016-02-10 DIAGNOSIS — E039 Hypothyroidism, unspecified: Secondary | ICD-10-CM | POA: Insufficient documentation

## 2016-02-10 DIAGNOSIS — I4892 Unspecified atrial flutter: Secondary | ICD-10-CM | POA: Diagnosis not present

## 2016-02-10 DIAGNOSIS — Z833 Family history of diabetes mellitus: Secondary | ICD-10-CM | POA: Diagnosis not present

## 2016-02-10 DIAGNOSIS — Z9889 Other specified postprocedural states: Secondary | ICD-10-CM

## 2016-02-10 DIAGNOSIS — Z85820 Personal history of malignant melanoma of skin: Secondary | ICD-10-CM | POA: Insufficient documentation

## 2016-02-10 DIAGNOSIS — I481 Persistent atrial fibrillation: Secondary | ICD-10-CM

## 2016-02-10 DIAGNOSIS — Z8249 Family history of ischemic heart disease and other diseases of the circulatory system: Secondary | ICD-10-CM | POA: Diagnosis not present

## 2016-02-10 DIAGNOSIS — Z7901 Long term (current) use of anticoagulants: Secondary | ICD-10-CM | POA: Diagnosis not present

## 2016-02-10 DIAGNOSIS — E559 Vitamin D deficiency, unspecified: Secondary | ICD-10-CM | POA: Insufficient documentation

## 2016-02-10 DIAGNOSIS — Z882 Allergy status to sulfonamides status: Secondary | ICD-10-CM | POA: Insufficient documentation

## 2016-02-10 NOTE — Patient Instructions (Addendum)
Your physician has recommended you make the following change in your medication: Decrease carvedilol to 1/2 tablet in the morning and 1/2 tablet in the pm.

## 2016-02-10 NOTE — Anesthesia Preprocedure Evaluation (Deleted)
Anesthesia Evaluation  Patient identified by MRN, date of birth, ID band Patient awake    Reviewed: Allergy & Precautions, NPO status , Patient's Chart, lab work & pertinent test results  Airway Mallampati: II       Dental   Pulmonary asthma , pneumonia,    breath sounds clear to auscultation       Cardiovascular + Valvular Problems/Murmurs  Rhythm:Regular Rate:Normal     Neuro/Psych    GI/Hepatic (+) Hepatitis -  Endo/Other  Hypothyroidism   Renal/GU      Musculoskeletal   Abdominal   Peds  Hematology   Anesthesia Other Findings   Reproductive/Obstetrics                             Anesthesia Physical Anesthesia Plan  ASA: III  Anesthesia Plan: MAC   Post-op Pain Management:    Induction: Intravenous  Airway Management Planned: Simple Face Mask  Additional Equipment:   Intra-op Plan:   Post-operative Plan:   Informed Consent:   Dental advisory given  Plan Discussed with: Anesthesiologist and CRNA  Anesthesia Plan Comments:         Anesthesia Quick Evaluation

## 2016-02-10 NOTE — Progress Notes (Signed)
Patient ID: Monica Morgan, female   DOB: [birth year], 55 y.o.   MRN: [id number]     Primary Care Physician: Alesia Richards, MD Referring Physician: [facility] f/u   Monica Morgan is a 55 y.o. female with a h/o PAF, chadsvasc score of 2, TMC, admitted forTikosyn initiation. Renal function and electrolytes were followed during the hospitalization. TEE noted LVEF 30-35%, f/u TTE her LVEF 35-40%. Her medicines adjusted accordingly to d/c her CCB and start BB/ACE tx. She QTc became prolonged on 551mcg dose and was decreased to 240mcg BID and remained stable at this dose. She were monitored until discharge on telemetry which demonstrated SR. On the day of discharge, she was examined by Dr Curt Bears who considered her stable for discharge to home. Follow-up arranged with the AFib clinic in 1 week for EKG and labs and with Dr Curt Bears in 4 weeks. Follow up with Dr. Radford Pax will be planned after her visit with Dr. Curt Bears in 1 month.   F/u in afib clinic s/p one week post hospitalization for tikosyn load. QTC stable, no further afib. Reviewed with pt Tikosyn guidelines re administration and drug interactions. Her EF was around 35% with latest echo and she is having LLE just starting yesterday. She is drinking 8 16 oz bottles of water and was asked to cut back on fluid intake.On eliquis for chadsvasc score of at least 2(female, HF)  F/u in afib clinic 10/27. Pt is one month post ablation. No afib since procedure.She is tired and has a mild cough. No issues with swallowing or groin issues. We discussed weaning off BB but I would like for her to continue tikosyn for the 3 month healing period and let Dr. Curt Bears make the decision to stop drug.   Today, she denies symptoms of palpitations, chest pain, shortness of breath, orthopnea, PND, lower extremity edema, dizziness, presyncope, syncope, or neurologic sequela. The patient is tolerating medications without difficulties and is otherwise without complaint  today.   Past Medical History:  Diagnosis Date  . Arthritis    "probably in my knees" (10/25/2015)  . Asthma attack X 1 10/2015  . Atrial flutter ([place]) 10/2015  . Heart murmur   . Hepatitis 1972  . Hypothyroidism   . Malignant melanoma of leg ([place])    "right thigh"  . PAF (paroxysmal atrial fibrillation) ([place])   . Pneumonia 1966; 1967   "left lung collapsed one of these times"  . Psoriasis   . Rosacea   . Vitamin D deficiency    Past Surgical History:  Procedure Laterality Date  . CARDIOVERSION N/A 10/25/2015   Procedure: CARDIOVERSION;  Surgeon: Sueanne Margarita, MD;  Location: [place] [facility];  Service: Cardiovascular;  Laterality: N/A;  . CERVICAL ABLATION  ~ 2012  . CESAREAN SECTION  1980  . CESAREAN SECTION WITH BILATERAL TUBAL LIGATION  1982  . ELECTROPHYSIOLOGIC STUDY N/A 01/13/2016   Procedure: Atrial Fibrillation Ablation;  Surgeon: Will Meredith Leeds, MD;  Location: [place] [facility];  Service: Cardiovascular;  Laterality: N/A;  . MELANOMA EXCISION Right 10/1975   outer thigh "malignant"  . REFRACTIVE SURGERY Bilateral 1990s  . TEE WITHOUT CARDIOVERSION N/A 10/25/2015   Procedure: TRANSESOPHAGEAL ECHOCARDIOGRAM (TEE);  Surgeon: Sueanne Margarita, MD;  [facility] END[facility]COPY;  Service: Cardiovascular;  Laterality: N/A;  . TEE WITHOUT CARDIOVERSION N/A 01/11/2016   Procedure: TRANSESOPHAGEAL ECHOCARDIOGRAM (TEE);  Surgeon: Josue Hector, MD;  Location: [place];  Service: Cardiovascular;  Laterality: N/A;  . [place]    Current Outpatient  Prescriptions  Medication Sig Dispense Refill  . apixaban (ELIQUIS) 5 MG TABS tablet Take 1 tablet (5 mg total) by mouth 2 (two) times daily. 60 tablet 6  . augmented betamethasone dipropionate (DIPROLENE-AF) 0.05 % cream Apply 1 application topically daily as needed (Psoriasis).    . Calcium 600-400 MG-UNIT CHEW Chew 1 each by mouth 2 (two) times daily.     . carvedilol (COREG) 6.25 MG tablet Take 1 tablet (6.25 mg total) by  mouth 2 (two) times daily with a meal. 60 tablet 3  . Cholecalciferol (VITAMIN D PO) Take 10,000 Units by mouth daily.     . Coenzyme Q10 (CO Q10) 100 MG CAPS Take 100 mg by mouth daily.     Marland Kitchen dofetilide (TIKOSYN) 250 MCG capsule Take 1 capsule (250 mcg total) by mouth 2 (two) times daily. 60 capsule 3  . furosemide (LASIX) 20 MG tablet Take 1 tablet (20 mg total) by mouth as needed (take 1 tablet daily as needed for 3-5lb weight gain, increased swelling). 30 tablet 2  . levothyroxine (SYNTHROID, LEVOTHROID) 50 MCG tablet TAKE 1 TABLET (50 MCG TOTAL) BY MOUTH DAILY. 90 tablet 0  . lisinopril (PRINIVIL,ZESTRIL) 5 MG tablet Take 1 tablet (5 mg total) by mouth daily. 30 tablet 3  . loratadine (CLARITIN) 10 MG tablet TAKE 1 TABLET BY MOUTH DAILY AS NEEDED FOR ALLERGY RELIEF  0  . Magnesium 250 MG TABS Take 500 mg by mouth every evening.     . montelukast (SINGULAIR) 10 MG tablet TAKE 1 TABLET BY MOUTH EVERY DAY 90 tablet 1  . Multiple Vitamin (MULTIVITAMIN WITH MINERALS) TABS tablet Take 1 tablet by mouth daily.    . NONFORMULARY OR COMPOUNDED ITEM Place 1 each vaginally See admin instructions. Estriol/testosterone 0.25-0.25 vaginal suppository compound. Insert vaginally on Monday Tuesday Thursday Friday    . NONFORMULARY OR COMPOUNDED ITEM Place 1 application onto the skin See admin instructions. Biest 8/2 HRT compound  Apply topically every day except Saturday.    . NONFORMULARY OR COMPOUNDED ITEM Apply 1 each topically See admin instructions. Progesterone 8%/32ml Apply 0.82mls topically every day except Saturday.    Vladimir Faster Glycol-Propyl Glycol (SYSTANE OP) Place 1 drop into both eyes daily.    . Probiotic Product (PROBIOTIC DAILY PO) Take 1 tablet by mouth daily.    . Turmeric 500 MG CAPS Take 500 mg by mouth 2 (two) times daily.     No current facility-administered medications for this encounter.     Allergies  Allergen Reactions  . Lipitor [Atorvastatin] Other (See Comments)    "neck  pain"  . Metoprolol Other (See Comments)    DIDN'T WORK FOR PATIENT AND GAINED WEIGHT MIGHT HAVE BEEN TAKEN WITH RYTHMOL  . Rythmol [Propafenone] Other (See Comments)    "weight gain"  . Sulfa Drugs Cross Reactors Other (See Comments)    "soreness all over"    Social History   Social History  . Marital status: Divorced    Spouse name: N/A  . Number of children: N/A  . Years of education: N/A   Occupational History  . Not on file.   Social History Main Topics  . Smoking status: Never Smoker  . Smokeless tobacco: Never Used  . Alcohol use 2.4 oz/week    2 Shots of liquor, 2 Glasses of wine per week     Comment: 10/25/2015 "drink socially on the weekends"  . Drug use: No  . Sexual activity: Yes   Other Topics Concern  .  Not on file   Social History Narrative  . No narrative on file    Family History  Problem Relation Age of Onset  . Heart disease Mother   . Hypertension Mother   . Heart attack Mother   . Heart disease Father   . Diabetes Father   . Hypertension Father   . Heart attack Brother   . Colon cancer Neg Hx   . Stroke Neg Hx     ROS- All systems are reviewed and negative except as per the HPI above  Physical Exam: Vitals:   02/10/16 0846  BP: 126/76  Pulse: 60  Weight: 226 lb 12.8 oz (102.9 kg)  Height: 5\' 6"  (1.676 m)    GEN- The patient is well appearing, alert and oriented x 3 today.   Head- normocephalic, atraumatic Eyes-  Sclera clear, conjunctiva pink Ears- hearing intact Oropharynx- clear Neck- supple, no JVP Lymph- no cervical lymphadenopathy Lungs- Clear to ausculation bilaterally, normal work of breathing Heart- Regular rate and rhythm, no murmurs, rubs or gallops, PMI not laterally displaced GI- soft, NT, ND, + BS Extremities- no clubbing, cyanosis, or edema MS- no significant deformity or atrophy Skin- no rash or lesion Psych- euthymic mood, full affect Neuro- strength and sensation are intact  EKG- NSR at 60 bpm, pr int  164 ms, qrs int 98 ms, qtc 440 ms(stable) Epic records reviewed  Assessment and Plan: 1. Afib S/p ablation and is maintaining SR For now continue tikosyn 250 mcg bid, if stays in SR, anticipate being d/ced on f/u with Dr. Curt Bears Kt/mag checked last 9/30 and wnl Continue eliquis 5 mg bid Bmet/mag today  2. TMC EF around 35% EF will probably improve with maintenance of SR Due to fatigue ,will let her reduce dose to 3.25 mg bid Hopefully repeat echo in a few months will show normalization of EF with SR and can stop BB Continue lisinopril Weigh daily Avoid salt Lasix to use as needed for 3-5 lb weight gain over 1-2 days or increase in Pedal edema  F/u with Dr. Curt Bears 8/14  Butch Penny C. Willie Loy, [facility] [address] [place], [place] [address] [phone]

## 2016-02-14 ENCOUNTER — Encounter: Payer: Self-pay | Admitting: Internal Medicine

## 2016-02-28 ENCOUNTER — Other Ambulatory Visit: Payer: Self-pay | Admitting: Internal Medicine

## 2016-03-05 ENCOUNTER — Other Ambulatory Visit: Payer: Self-pay | Admitting: *Deleted

## 2016-03-05 MED ORDER — DOFETILIDE 250 MCG PO CAPS: 250.0000 ug | ORAL_CAPSULE | Freq: Two times a day (BID) | ORAL | 1 refills | Status: DC

## 2016-03-05 NOTE — Telephone Encounter (Signed)
Patient requested a refill on tikosyn and states that she may get to stop this medication at her office visit next month. Per a-fib clinic office visit, We discussed weaning off BB but I would like for her to continue tikosyn for the 3 month healing period and let Dr. Curt Bears make the decision to stop drug.

## 2016-03-28 ENCOUNTER — Other Ambulatory Visit: Payer: Self-pay | Admitting: Cardiology

## 2016-03-29 ENCOUNTER — Encounter: Payer: Self-pay | Admitting: Cardiology

## 2016-04-13 ENCOUNTER — Encounter: Payer: Self-pay | Admitting: Cardiology

## 2016-04-13 ENCOUNTER — Ambulatory Visit (INDEPENDENT_AMBULATORY_CARE_PROVIDER_SITE_OTHER): Payer: 59 | Admitting: Cardiology

## 2016-04-13 VITALS — BP 130/84 | HR 66 | Ht 66.0 in | Wt 226.6 lb

## 2016-04-13 DIAGNOSIS — I4819 Other persistent atrial fibrillation: Secondary | ICD-10-CM

## 2016-04-13 DIAGNOSIS — I481 Persistent atrial fibrillation: Secondary | ICD-10-CM

## 2016-04-13 NOTE — Patient Instructions (Signed)
Medication Instructions:    Your physician has recommended you make the following change in your medication:  1) STOP Tikosyn  --- If you need a refill on your cardiac medications before your next appointment, please call your pharmacy. ---  Labwork:  None ordered  Testing/Procedures:  None ordered  Follow-Up:  Your physician recommends that you schedule a follow-up appointment in: 3 months with Dr. Curt Bears.   Thank you for choosing [facility]!!   Trinidad Curet, RN [phone]

## 2016-04-13 NOTE — Progress Notes (Signed)
Electrophysiology Office Note   Date:  04/13/2016   ID:  Monica Morgan, DOB 07/06/1960, MRN [id number]  PCP:  Alesia Richards, MD  Cardiologist:  Radford Pax Primary Electrophysiologist:  Woodie Degraffenreid Meredith Leeds, MD    Chief Complaint  Patient presents with  . Follow-up    Persistent AFib     History of Present Illness: Monica Morgan is a 55 y.o. female who presents today for electrophysiology evaluation.   Recent admission for tikosyn loading.  Had TEE/CV which restored sinus rhythm.  TEE noted EF 35-40%.  Discharged in sinus rhythm.  Continuing to have AF symptoms. Had AF ablation 01/13/16.   Today, she denies symptoms of palpitations, chest pain, shortness of breath, orthopnea, PND, lower extremity edema, claudication, dizziness, presyncope, syncope, bleeding, or neurologic sequela. The patient is tolerating medications without difficulties and is otherwise without complaint today. She has had no recurrence of AF. She was seen in AF clinic and her coreg was cut in half.  She is having much less fatigue since cutting back on the dose.   Past Medical History:  Diagnosis Date  . Arthritis    "probably in my knees" (10/25/2015)  . Asthma attack X 1 10/2015  . Atrial flutter ([place]) 10/2015  . Heart murmur   . Hepatitis 1972  . Hypothyroidism   . Malignant melanoma of leg ([place])    "right thigh"  . PAF (paroxysmal atrial fibrillation) ([place])   . Pneumonia 1966; 1967   "left lung collapsed one of these times"  . Psoriasis   . Rosacea   . Vitamin D deficiency    Past Surgical History:  Procedure Laterality Date  . CARDIOVERSION N/A 10/25/2015   Procedure: CARDIOVERSION;  Surgeon: Sueanne Margarita, MD;  Location: [place] [facility];  Service: Cardiovascular;  Laterality: N/A;  . CERVICAL ABLATION  ~ 2012  . CESAREAN SECTION  1980  . CESAREAN SECTION WITH BILATERAL TUBAL LIGATION  1982  . ELECTROPHYSIOLOGIC STUDY N/A 01/13/2016   Procedure: Atrial Fibrillation Ablation;  Surgeon: Mayer Vondrak  Meredith Leeds, MD;  Location: [place] [facility];  Service: Cardiovascular;  Laterality: N/A;  . MELANOMA EXCISION Right 10/1975   outer thigh "malignant"  . REFRACTIVE SURGERY Bilateral 1990s  . TEE WITHOUT CARDIOVERSION N/A 10/25/2015   Procedure: TRANSESOPHAGEAL ECHOCARDIOGRAM (TEE);  Surgeon: Sueanne Margarita, MD;  Location[facility] ENDOSCOPY;  Service: Cardiovascular;  Laterality: N/A;  . TEE WITHOUT CARDIOVERSION N/A 01/11/2016   Procedure: TRANSESOPHAGEAL ECHOCARDIOGRAM (TEE);  Surgeon: Josue Hector, MD;  Location: [facility] ENDOSCOPY;  Service: Cardiovascular;  Laterality: N/A;  . TONSILLECTOMY  1967     Current Outpatient Prescriptions  Medication Sig Dispense Refill  . apixaban (ELIQUIS) 5 MG TABS tablet Take 1 tablet (5 mg total) by mouth 2 (two) times daily. 60 tablet 6  . augmented betamethasone dipropionate (DIPROLENE-AF) 0.05 % cream Apply 1 application topically daily as needed (Psoriasis).    . Calcium 600-400 MG-UNIT CHEW Chew 1 each by mouth 2 (two) times daily.     . carvedilol (COREG) 3.125 MG tablet Take 3.125 mg by mouth 2 (two) times daily with a meal.    . carvedilol (COREG) 6.25 MG tablet Take 3.125 mg by mouth daily.    . Cholecalciferol (VITAMIN D PO) Take 10,000 Units by mouth daily.     . Coenzyme Q10 (CO Q10) 100 MG CAPS Take 100 mg by mouth daily.     Marland Kitchen dofetilide (TIKOSYN) 250 MCG capsule Take 1 capsule (250 mcg total) by mouth 2 (two) times  daily. 60 capsule 1  . furosemide (LASIX) 20 MG tablet Take 1 tablet (20 mg total) by mouth as needed (take 1 tablet daily as needed for 3-5lb weight gain, increased swelling). 30 tablet 2  . levothyroxine (SYNTHROID, LEVOTHROID) 50 MCG tablet TAKE 1 TABLET (50 MCG TOTAL) BY MOUTH DAILY. 90 tablet 0  . lisinopril (PRINIVIL,ZESTRIL) 5 MG tablet Take 2.5 mg by mouth 2 (two) times daily.    Marland Kitchen loratadine (CLARITIN) 10 MG tablet TAKE 1 TABLET BY MOUTH DAILY AS NEEDED FOR ALLERGY RELIEF  0  . Magnesium 250 MG TABS Take 500 mg by mouth every  evening.     . montelukast (SINGULAIR) 10 MG tablet TAKE 1 TABLET BY MOUTH EVERY DAY 90 tablet 1  . Multiple Vitamin (MULTIVITAMIN WITH MINERALS) TABS tablet Take 1 tablet by mouth daily.    . NONFORMULARY OR COMPOUNDED ITEM Place 1 each vaginally See admin instructions. Estriol/testosterone 0.25-0.25 vaginal suppository compound. Insert vaginally on Monday Tuesday Thursday Friday    . NONFORMULARY OR COMPOUNDED ITEM Place 1 application onto the skin See admin instructions. Biest 8/2 HRT compound  Apply topically every day except Saturday.    . NONFORMULARY OR COMPOUNDED ITEM Apply 1 each topically See admin instructions. Progesterone 8%/29ml Apply 0.86mls topically every day except Saturday.    Vladimir Faster Glycol-Propyl Glycol (SYSTANE OP) Place 1 drop into both eyes daily.    . Probiotic Product (PROBIOTIC DAILY PO) Take 1 tablet by mouth daily.    . Turmeric 500 MG CAPS Take 500 mg by mouth 2 (two) times daily.     No current facility-administered medications for this visit.     Allergies:   Lipitor [atorvastatin]; Metoprolol; Rythmol [propafenone]; and Sulfa drugs cross reactors   Social History:  The patient  reports that she has never smoked. She has never used smokeless tobacco. She reports that she drinks about 2.4 oz of alcohol per week . She reports that she does not use drugs.   Family History:  The patient's family history includes Diabetes in her father; Heart attack in her brother and mother; Heart disease in her father and mother; Hypertension in her father and mother.    ROS:  Please see the history of present illness.   Otherwise, review of systems is positive for none.   All other systems are reviewed and negative.    PHYSICAL EXAM: VS:  BP 130/84   Pulse 66   Ht 5\' 6"  (1.676 m)   Wt 226 lb 9.6 oz (102.8 kg)   LMP 10/29/2013 (Approximate)   BMI 36.57 kg/m  , BMI Body mass index is 36.57 kg/m. GEN: Well nourished, well developed, in no acute distress  HEENT: normal    Neck: no JVD, carotid bruits, or masses Cardiac: RRR; no murmurs, rubs, or gallops,no edema  Respiratory:  clear to auscultation bilaterally, normal work of breathing GI: soft, nontender, nondistended, + BS MS: no deformity or atrophy  Skin: warm and dry Neuro:  Strength and sensation are intact Psych: euthymic mood, full affect  EKG:  EKG is not ordered today. Personal review of the ekg ordered shows sinus rhythm, rate 66   Recent Labs: 10/20/2015: ALT 22; B Natriuretic Peptide 209.7 01/14/2016: BUN 12; Creatinine, Ser 0.84; Magnesium 2.2; Potassium 4.4; Sodium 139 01/26/2016: Hemoglobin 13.7; Platelets 315; TSH 1.39    Lipid Panel     Component Value Date/Time   CHOL 215 (H) 01/26/2016 1600   TRIG 226 (H) 01/26/2016 1600   HDL 42 (  L) 01/26/2016 1600   CHOLHDL 5.1 (H) 01/26/2016 1600   VLDL 45 (H) 01/26/2016 1600   LDLCALC 128 01/26/2016 1600     Wt Readings from Last 3 Encounters:  04/13/16 226 lb 9.6 oz (102.8 kg)  02/10/16 226 lb 12.8 oz (102.9 kg)  01/26/16 222 lb 12.8 oz (101.1 kg)      Other studies Reviewed: Additional studies/ records that were reviewed today include: TTE 10/26/15  Review of the above records today demonstrates:  - Left ventricle: The cavity size was normal. Systolic function was   moderately reduced. The estimated ejection fraction was in the   range of 35% to 40%. Diffuse hypokinesis. Features are consistent   with a pseudonormal left ventricular filling pattern, with   concomitant abnormal relaxation and increased filling pressure   (grade 2 diastolic dysfunction). Doppler parameters are   consistent with elevated ventricular end-diastolic filling   pressure. - Aortic valve: Trileaflet; normal thickness leaflets. There was no   regurgitation. - Aortic root: The aortic root was normal in size. - Ascending aorta: The ascending aorta was normal in size. - Mitral valve: There was mild regurgitation. - Left atrium: The atrium was mildly  dilated. - Tricuspid valve: There was mild regurgitation. - Pulmonary arteries: Systolic pressure was mildly increased. PA   peak pressure: 38 mm Hg (S). - Inferior vena cava: The vessel was normal in size. - Pericardium, extracardiac: A trivial pericardial effusion was   identified posterior to the heart.   ASSESSMENT AND PLAN:  1.  Persistent atrial fibrillation: On Eliquis. AF ablation 01/13/16. She's had no further atrial fibrillation. We did discuss the option of continuing her decrease in, as she has been doing well. She would like to stop T dofetilide that this time says she feels like it could possibly be causing her more fatigued. She does have extra multiecho home, and therefore start that should she have more atrial fibrillation.  This patients CHA2DS2-VASc Score and unadjusted Ischemic Stroke Rate (% per year) is equal to 3.2 % stroke rate/year from a score of 3  Above score calculated as 1 point each if present [CHF, HTN, DM, Vascular=MI/PAD/Aortic Plaque, Age if 65-74, or Female] Above score calculated as 2 points each if present [Age > 75, or Stroke/TIA/TE]  2. Hypertension: Well controlled today.  3. Snoring: Snores at night but unsure if she holds her breath. Natale Barba order a sleep study, as this may help to treat her atrial fibrillation. Current medicines are reviewed at length with the patient today.   The patient does not have concerns regarding her medicines.  The following changes were made today:  none  Labs/ tests ordered today include:  No orders of the defined types were placed in this encounter.    Disposition:   FU with Leafy Motsinger 3 months  Signed, Rock Sobol Meredith Leeds, MD  04/13/2016 8:29 AM     [facility] [address] [place] [address] [phone] (office) [phone] (fax)

## 2016-04-28 ENCOUNTER — Other Ambulatory Visit: Payer: Self-pay | Admitting: Internal Medicine

## 2016-04-30 ENCOUNTER — Other Ambulatory Visit: Payer: Self-pay | Admitting: Physician Assistant

## 2016-05-01 ENCOUNTER — Other Ambulatory Visit: Payer: Self-pay | Admitting: *Deleted

## 2016-05-01 ENCOUNTER — Telehealth: Payer: Self-pay | Admitting: *Deleted

## 2016-05-01 MED ORDER — DRONEDARONE HCL 400 MG PO TABS: 400.0000 mg | ORAL_TABLET | Freq: Two times a day (BID) | ORAL | 1 refills | Status: DC | PRN

## 2016-05-01 NOTE — Telephone Encounter (Signed)
Spoke with patient to verify pharmacy. Rx sent. Patient aware.

## 2016-05-01 NOTE — Telephone Encounter (Signed)
AVS Reports   Date/Time Report Action User  04/13/2016 8:39 AM After Visit Summary Printed Stanton Kidney, RN  Patient Instructions   Medication Instructions:    Your physician has recommended you make the following change in your medication:  1) STOP Tikosyn

## 2016-05-01 NOTE — Telephone Encounter (Signed)
Multaq 400 mg BID PRN for Afib episode/s.

## 2016-05-01 NOTE — Telephone Encounter (Signed)
Ok to [employer].  Patient understands to take it prn if AFib episode.  thx

## 2016-05-01 NOTE — Telephone Encounter (Signed)
I called patient to verify that she is not taking the tikosyn as a refill request was received. She stated that she is not taking this medication so therefore I will refuse the refill. She requested a refill of 5-10 tablets for multaq to have on hand in case she needs it. Please advise. Thanks, MI

## 2016-05-01 NOTE — Telephone Encounter (Signed)
So to clarify, multaq 400 mg with a sig of take one tablet by mouth as needed for AFib episode. Quantity of ten? Please advise. Thanks, MI

## 2016-05-02 ENCOUNTER — Encounter: Payer: Self-pay | Admitting: Internal Medicine

## 2016-05-19 ENCOUNTER — Other Ambulatory Visit: Payer: Self-pay | Admitting: Physician Assistant

## 2016-06-27 ENCOUNTER — Other Ambulatory Visit: Payer: Self-pay | Admitting: Physician Assistant

## 2016-07-10 NOTE — Progress Notes (Signed)
Electrophysiology Office Note   Date:  07/11/2016   ID:  Monica Morgan, DOB 1960-12-31, MRN [id number]  PCP:  Alesia Richards, MD  Cardiologist:  Radford Pax Primary Electrophysiologist:  Shanteria Laye Meredith Leeds, MD    Chief Complaint  Patient presents with  . Atrial Fibrillation     History of Present Illness: Monica Morgan is a 56 y.o. female who presents today for electrophysiology evaluation.   Recent admission for tikosyn loading.  Had TEE/CV which restored sinus rhythm.  TEE noted EF 35-40%.  Discharged in sinus rhythm.  Continuing to have AF symptoms. Had AF ablation 01/13/16. Taken off tikosyn 12/17. She is felt well since last being seen. She was previously having palpitations and was prescribed when necessary all tach, but she has not needed to take any of that.   Today, she denies symptoms of palpitations, chest pain, shortness of breath, orthopnea, PND, lower extremity edema, claudication, dizziness, presyncope, syncope, bleeding, or neurologic sequela. The patient is tolerating medications without difficulties and is otherwise without complaint today.    Past Medical History:  Diagnosis Date  . Arthritis    "probably in my knees" (10/25/2015)  . Asthma attack X 1 10/2015  . Atrial flutter ([place]) 10/2015  . Heart murmur   . Hepatitis 1972  . Hypothyroidism   . Malignant melanoma of leg ([place])    "right thigh"  . PAF (paroxysmal atrial fibrillation) ([place])   . Pneumonia 1966; 1967   "left lung collapsed one of these times"  . Psoriasis   . Rosacea   . Vitamin D deficiency    Past Surgical History:  Procedure Laterality Date  . CARDIOVERSION N/A 10/25/2015   Procedure: CARDIOVERSION;  Surgeon: Sueanne Margarita, MD;  Location: [facility] ENDOSCOPY;  Service: Cardiovascular;  Laterality: N/A;  . CERVICAL ABLATION  ~ 2012  . CESAREAN SECTION  1980  . CESAREAN SECTION WITH BILATERAL TUBAL LIGATION  1982  . ELECTROPHYSIOLOGIC STUDY N/A 01/13/2016   Procedure: Atrial Fibrillation  Ablation;  Surgeon: Payal Stanforth Meredith Leeds, MD;  Location: [facility];  Service: Cardiovascular;  Laterality: N/A;  . MELANOMA EXCISION Right 10/1975   outer thigh "malignant"  . REFRACTIVE SURGERY Bilateral 1990s  . TEE WITHOUT CARDIOVERSION N/A 10/25/2015   Procedure: TRANSESOPHAGEAL ECHOCARDIOGRAM (TEE);  Surgeon: Sueanne Margarita, MD;  Location: [facility] ENDOSCOPY;  Service: Cardiovascular;  Laterality: N/A;  . TEE WITHOUT CARDIOVERSION N/A 01/11/2016   Procedure: TRANSESOPHAGEAL ECHOCARDIOGRAM (TEE);  Surgeon: Josue Hector, MD;  Location: [facility] ENDOSCOPY;  Service: Cardiovascular;  Laterality: N/A;  . TONSILLECTOMY  1967     Current Outpatient Prescriptions  Medication Sig Dispense Refill  . augmented betamethasone dipropionate (DIPROLENE-AF) 0.05 % cream Apply 1 application topically daily as needed (Psoriasis).    . Calcium 600-400 MG-UNIT CHEW Chew 1 each by mouth 2 (two) times daily.     . carvedilol (COREG) 6.25 MG tablet Take 3.125 mg by mouth daily.    . Cholecalciferol (VITAMIN D PO) Take 10,000 Units by mouth daily.     . Coenzyme Q10 (CO Q10) 100 MG CAPS Take 100 mg by mouth daily.     Marland Kitchen dronedarone (MULTAQ) 400 MG tablet Take 1 tablet (400 mg total) by mouth 2 (two) times daily as needed. For AFib episode/s 10 tablet 1  . ELIQUIS 5 MG TABS tablet TAKE 1 TABLET (5 MG TOTAL) BY MOUTH 2 (TWO) TIMES DAILY. 60 tablet 5  . furosemide (LASIX) 20 MG tablet Take 1 tablet (20 mg total) by mouth  as needed (take 1 tablet daily as needed for 3-5lb weight gain, increased swelling). 30 tablet 2  . levothyroxine (SYNTHROID, LEVOTHROID) 50 MCG tablet TAKE 1 TABLET (50 MCG TOTAL) BY MOUTH DAILY. 90 tablet 0  . lisinopril (PRINIVIL,ZESTRIL) 5 MG tablet Take 2.5 mg by mouth daily.     Marland Kitchen loratadine (CLARITIN) 10 MG tablet TAKE 1 TABLET BY MOUTH DAILY AS NEEDED FOR ALLERGY RELIEF  0  . Magnesium 250 MG TABS Take 500 mg by mouth every evening.     . montelukast (SINGULAIR) 10 MG tablet TAKE 1 TABLET BY  MOUTH EVERY DAY 90 tablet 0  . Multiple Vitamin (MULTIVITAMIN WITH MINERALS) TABS tablet Take 1 tablet by mouth daily.    . NONFORMULARY OR COMPOUNDED ITEM Place 1 each vaginally See admin instructions. Estriol/testosterone 0.25-0.25 vaginal suppository compound. Insert vaginally on Monday Tuesday Thursday Friday    . NONFORMULARY OR COMPOUNDED ITEM Place 1 application onto the skin See admin instructions. Biest 8/2 HRT compound  Apply topically every day except Saturday.    . NONFORMULARY OR COMPOUNDED ITEM Apply 1 each topically See admin instructions. Progesterone 8%/69ml Apply 0.53mls topically every day except Saturday.    Vladimir Faster Glycol-Propyl Glycol (SYSTANE OP) Place 1 drop into both eyes daily.    . Probiotic Product (PROBIOTIC DAILY PO) Take 1 tablet by mouth daily.    . Turmeric 500 MG CAPS Take 500 mg by mouth 2 (two) times daily.    . carvedilol (COREG) 3.125 MG tablet Take 3.125 mg by mouth 2 (two) times daily with a meal.     No current facility-administered medications for this visit.     Allergies:   Lipitor [atorvastatin]; Metoprolol; Rythmol [propafenone]; and Sulfa drugs cross reactors   Social History:  The patient  reports that she has never smoked. She has never used smokeless tobacco. She reports that she drinks about 2.4 oz of alcohol per week . She reports that she does not use drugs.   Family History:  The patient's family history includes Diabetes in her father; Heart attack in her brother and mother; Heart disease in her father and mother; Hypertension in her father and mother.    ROS:  Please see the history of present illness.   Otherwise, review of systems is positive for cough.   All other systems are reviewed and negative.    PHYSICAL EXAM: VS:  BP 124/80   Pulse 68   Ht 5' (1.524 m)   Wt 220 lb (99.8 kg)   LMP 10/29/2013 (Approximate)   BMI 42.97 kg/m  , BMI Body mass index is 42.97 kg/m. GEN: Well nourished, well developed, in no acute distress   HEENT: normal  Neck: no JVD, carotid bruits, or masses Cardiac: RRR; no murmurs, rubs, or gallops,no edema  Respiratory:  clear to auscultation bilaterally, normal work of breathing GI: soft, nontender, nondistended, + BS MS: no deformity or atrophy  Skin: warm and dry Neuro:  Strength and sensation are intact Psych: euthymic mood, full affect  EKG:  EKG is not ordered today. Personal review of the ekg ordered 04/13/16 shows sinus rhythm, rate 66   Recent Labs: 10/20/2015: ALT 22; B Natriuretic Peptide 209.7 01/14/2016: BUN 12; Creatinine, Ser 0.84; Magnesium 2.2; Potassium 4.4; Sodium 139 01/26/2016: Hemoglobin 13.7; Platelets 315; TSH 1.39    Lipid Panel     Component Value Date/Time   CHOL 215 (H) 01/26/2016 1600   TRIG 226 (H) 01/26/2016 1600   HDL 42 (L) 01/26/2016 1600  CHOLHDL 5.1 (H) 01/26/2016 1600   VLDL 45 (H) 01/26/2016 1600   LDLCALC 128 01/26/2016 1600     Wt Readings from Last 3 Encounters:  07/11/16 220 lb (99.8 kg)  04/13/16 226 lb 9.6 oz (102.8 kg)  02/10/16 226 lb 12.8 oz (102.9 kg)      Other studies Reviewed: Additional studies/ records that were reviewed today include: TTE 10/26/15  Review of the above records today demonstrates:  - Left ventricle: The cavity size was normal. Systolic function was   moderately reduced. The estimated ejection fraction was in the   range of 35% to 40%. Diffuse hypokinesis. Features are consistent   with a pseudonormal left ventricular filling pattern, with   concomitant abnormal relaxation and increased filling pressure   (grade 2 diastolic dysfunction). Doppler parameters are   consistent with elevated ventricular end-diastolic filling   pressure. - Aortic valve: Trileaflet; normal thickness leaflets. There was no   regurgitation. - Aortic root: The aortic root was normal in size. - Ascending aorta: The ascending aorta was normal in size. - Mitral valve: There was mild regurgitation. - Left atrium: The atrium  was mildly dilated. - Tricuspid valve: There was mild regurgitation. - Pulmonary arteries: Systolic pressure was mildly increased. PA   peak pressure: 38 mm Hg (S). - Inferior vena cava: The vessel was normal in size. - Pericardium, extracardiac: A trivial pericardial effusion was   identified posterior to the heart.   ASSESSMENT AND PLAN:  1.  Persistent atrial fibrillation: On Eliquis. AF ablation 01/13/16. She's had no further atrial fibrillation. Tikosyn stopped 04/13/16.Has when necessary multi can home. Is feeling well without major complaint. Continue current medications.  This patients CHA2DS2-VASc Score and unadjusted Ischemic Stroke Rate (% per year) is equal to 3.2 % stroke rate/year from a score of 3  Above score calculated as 1 point each if present [CHF, HTN, DM, Vascular=MI/PAD/Aortic Plaque, Age if 65-74, or Female] Above score calculated as 2 points each if present [Age > 75, or Stroke/TIA/TE]  2. Hypertension: Well controlled today.  3. Mild OSA: Currently losing weight and trying conservative measures prior to CPAP.  Current medicines are reviewed at length with the patient today.   The patient does not have concerns regarding her medicines.  The following changes were made today:  none  Labs/ tests ordered today include:  No orders of the defined types were placed in this encounter.    Disposition:   FU with Aaditya Letizia 6 months  Signed, Melaney Tellefsen Meredith Leeds, MD  07/11/2016 8:44 AM     [facility] [address] [place] [address] [phone] (office) [phone] (fax)

## 2016-07-11 ENCOUNTER — Ambulatory Visit (INDEPENDENT_AMBULATORY_CARE_PROVIDER_SITE_OTHER): Payer: 59 | Admitting: Cardiology

## 2016-07-11 VITALS — BP 124/80 | HR 68 | Ht 66.0 in | Wt 220.0 lb

## 2016-07-11 DIAGNOSIS — I480 Paroxysmal atrial fibrillation: Secondary | ICD-10-CM | POA: Diagnosis not present

## 2016-07-11 NOTE — Patient Instructions (Signed)
Medication Instructions:    Your physician recommends that you continue on your current medications as directed. Please refer to the Current Medication list given to you today.  --- If you need a refill on your cardiac medications before your next appointment, please call your pharmacy. ---  Labwork:  None ordered  Testing/Procedures:  None ordered  Follow-Up:  Your physician wants you to follow-up in: 6 months with Dr. Curt Bears.  You will receive a reminder letter in the mail two months in advance. If you don't receive a letter, please call our office to schedule the follow-up appointment.   Any Other Special Instructions Will Be Listed Below (If Applicable). We will contact you about your Eliquis -  We will attempt a tier exception to look into cheaper cost.   Thank you for choosing [facility]!!   Trinidad Curet, RN [phone]

## 2016-07-29 ENCOUNTER — Other Ambulatory Visit: Payer: Self-pay | Admitting: Physician Assistant

## 2016-08-16 ENCOUNTER — Ambulatory Visit (INDEPENDENT_AMBULATORY_CARE_PROVIDER_SITE_OTHER): Payer: 59 | Admitting: Internal Medicine

## 2016-08-16 VITALS — BP 124/82 | HR 72 | Temp 97.4°F | Resp 16 | Ht 65.75 in | Wt 224.0 lb

## 2016-08-16 DIAGNOSIS — Z0000 Encounter for general adult medical examination without abnormal findings: Secondary | ICD-10-CM | POA: Diagnosis not present

## 2016-08-16 DIAGNOSIS — Z79899 Other long term (current) drug therapy: Secondary | ICD-10-CM

## 2016-08-16 DIAGNOSIS — Z111 Encounter for screening for respiratory tuberculosis: Secondary | ICD-10-CM

## 2016-08-16 DIAGNOSIS — R7309 Other abnormal glucose: Secondary | ICD-10-CM

## 2016-08-16 DIAGNOSIS — E559 Vitamin D deficiency, unspecified: Secondary | ICD-10-CM

## 2016-08-16 DIAGNOSIS — E039 Hypothyroidism, unspecified: Secondary | ICD-10-CM

## 2016-08-16 DIAGNOSIS — Z1212 Encounter for screening for malignant neoplasm of rectum: Secondary | ICD-10-CM

## 2016-08-16 DIAGNOSIS — I10 Essential (primary) hypertension: Secondary | ICD-10-CM

## 2016-08-16 DIAGNOSIS — Z0001 Encounter for general adult medical examination with abnormal findings: Secondary | ICD-10-CM

## 2016-08-16 DIAGNOSIS — R5383 Other fatigue: Secondary | ICD-10-CM

## 2016-08-16 DIAGNOSIS — Z136 Encounter for screening for cardiovascular disorders: Secondary | ICD-10-CM | POA: Diagnosis not present

## 2016-08-16 DIAGNOSIS — E782 Mixed hyperlipidemia: Secondary | ICD-10-CM

## 2016-08-16 LAB — BASIC METABOLIC PANEL WITH GFR
BUN: 19 mg/dL (ref 7–25)
CO2: 24 mmol/L (ref 20–31)
Calcium: 9.5 mg/dL (ref 8.6–10.4)
Chloride: 103 mmol/L (ref 98–110)
Creat: 0.96 mg/dL (ref 0.50–1.05)
GFR, Est African American: 76 mL/min (ref >=60)
GFR, Est Non African American: 66 mL/min (ref >=60)
Glucose, Bld: 88 mg/dL (ref 65–99)
Potassium: 4.5 mmol/L (ref 3.5–5.3)
Sodium: 138 mmol/L (ref 135–146)

## 2016-08-16 LAB — CBC WITH DIFFERENTIAL/PLATELET
Basophils Absolute: 0 cells/uL (ref 0–200)
Basophils Relative: 0 %
Eosinophils Absolute: 117 cells/uL (ref 15–500)
Eosinophils Relative: 1 %
HCT: 41.5 % (ref 35.0–45.0)
Hemoglobin: 13.4 g/dL (ref 11.7–15.5)
Lymphocytes Relative: 34 %
Lymphs Abs: 3978 cells/uL — ABNORMAL HIGH (ref 850–3900)
MCH: 28.3 pg (ref 27.0–33.0)
MCHC: 32.3 g/dL (ref 32.0–36.0)
MCV: 87.6 fL (ref 80.0–100.0)
MPV: 9.7 fL (ref 7.5–12.5)
Monocytes Absolute: 1053 cells/uL — ABNORMAL HIGH (ref 200–950)
Monocytes Relative: 9 %
Neutro Abs: 6552 cells/uL (ref 1500–7800)
Neutrophils Relative %: 56 %
Platelets: 310 10*3/uL (ref 140–400)
RBC: 4.74 MIL/uL (ref 3.80–5.10)
RDW: 14.1 % (ref 11.0–15.0)
WBC: 11.7 10*3/uL — ABNORMAL HIGH (ref 3.8–10.8)

## 2016-08-16 LAB — HEPATIC FUNCTION PANEL
ALT: 13 U/L (ref 6–29)
AST: 16 U/L (ref 10–35)
Albumin: 4.5 g/dL (ref 3.6–5.1)
Alkaline Phosphatase: 52 U/L (ref 33–130)
Bilirubin, Direct: 0.1 mg/dL (ref <=0.2)
Indirect Bilirubin: 0.4 mg/dL (ref 0.2–1.2)
Total Bilirubin: 0.5 mg/dL (ref 0.2–1.2)
Total Protein: 7.2 g/dL (ref 6.1–8.1)

## 2016-08-16 LAB — IRON AND TIBC
%SAT: 13 % (ref 11–50)
Iron: 44 ug/dL — ABNORMAL LOW (ref 45–160)
TIBC: 352 ug/dL (ref 250–450)
UIBC: 308 ug/dL (ref 125–400)

## 2016-08-16 LAB — LIPID PANEL
Cholesterol: 218 mg/dL — ABNORMAL HIGH (ref <200)
HDL: 38 mg/dL — ABNORMAL LOW (ref >50)
LDL Cholesterol: 134 mg/dL — ABNORMAL HIGH (ref <100)
Total CHOL/HDL Ratio: 5.7 Ratio — ABNORMAL HIGH (ref <5.0)
Triglycerides: 230 mg/dL — ABNORMAL HIGH (ref <150)
VLDL: 46 mg/dL — ABNORMAL HIGH (ref <30)

## 2016-08-16 NOTE — Progress Notes (Signed)
[facility] Unk Pinto, M.D.      Uvaldo Bristle. Silverio Lay, P.A.-C [facility]                [address] [place] [address] Telephone [phone] Telefax [phone]  Annual Screening/Preventative Visit & Comprehensive Evaluation &  Examination     This very nice 56 y.o. DWF presents for a Screening/Preventative Visit & comprehensive evaluation and management of multiple medical co-morbidities.  Patient has been followed for HTN, hx/o pAfib,  Prediabetes, Hyperlipidemia and Vitamin D Deficiency.      HTN predates since 2013. Patient's BP has been controlled. Patient has long hx/o pAfib with intolerance to rescue meds and she underwent an ablation on 01/13/2016 by Dr Curt Bears.  Since then she has been tapered off of all cardiac meds except Coreg and she denies any cardiac symptoms as chest pain, palpitations, shortness of breath, dizziness or ankle swelling. Today's BP is at goal - 124/82.      Patient's hyperlipidemia is not controlled with diet with hx/o intolerance to Atorvastatin.  Patient denies myalgias or other medication SE's. Last lipids were not at goal: Lab Results  Component Value Date   CHOL 215 (H) 01/26/2016   HDL 42 (L) 01/26/2016   LDLCALC 128 01/26/2016   TRIG 226 (H) 01/26/2016   CHOLHDL 5.1 (H) 01/26/2016      Patient has Morbid Obesity (BMI 36+) and is monitored expectantly for prediabetes  and patient denies reactive hypoglycemic symptoms, visual blurring, diabetic polys, or paresthesias. Last A1c was at goal: Lab Results  Component Value Date   HGBA1C 5.5 01/26/2016      Finally, patient has history of Vitamin D Deficiency and last Vitamin D was at goal: Lab Results  Component Value Date   VD25OH 60 01/26/2016   Current Outpatient Prescriptions on File Prior to Visit  Medication Sig  . augmented betamethasone dipropionate (DIPROLENE-AF) 0.05 % cream Apply 1  application topically daily as needed (Psoriasis).  . Calcium 600-400 MG-UNIT CHEW Chew 1 each by mouth 2 (two) times daily.   . carvedilol (COREG) 3.125 MG tablet Take 3.125 mg by mouth 2 (two) times daily with a meal.  . carvedilol (COREG) 6.25 MG tablet Take 3.125 mg by mouth daily.  . Cholecalciferol (VITAMIN D PO) Take 10,000 Units by mouth daily.   . Coenzyme Q10 (CO Q10) 100 MG CAPS Take 100 mg by mouth daily.   Marland Kitchen dronedarone (MULTAQ) 400 MG tablet Take 1 tablet (400 mg total) by mouth 2 (two) times daily as needed. For AFib episode/s  . ELIQUIS 5 MG TABS tablet TAKE 1 TABLET (5 MG TOTAL) BY MOUTH 2 (TWO) TIMES DAILY.  . furosemide (LASIX) 20 MG tablet Take 1 tablet (20 mg total) by mouth as needed (take 1 tablet daily as needed for 3-5lb weight gain, increased swelling).  Marland Kitchen levothyroxine (SYNTHROID, LEVOTHROID) 50 MCG tablet TAKE 1 TABLET (50 MCG TOTAL) BY MOUTH DAILY.  Marland Kitchen lisinopril (PRINIVIL,ZESTRIL) 5 MG tablet Take 2.5 mg by mouth daily.   Marland Kitchen loratadine (CLARITIN) 10 MG tablet TAKE 1 TABLET BY MOUTH DAILY AS NEEDED FOR ALLERGY RELIEF  . Magnesium 250 MG TABS Take 500 mg by mouth every evening.   . montelukast (SINGULAIR) 10 MG tablet TAKE 1 TABLET BY MOUTH EVERY DAY  . Multiple Vitamin (MULTIVITAMIN WITH MINERALS) TABS tablet Take 1 tablet by mouth  daily.  . NONFORMULARY OR COMPOUNDED ITEM Place 1 each vaginally See admin instructions. Estriol/testosterone 0.25-0.25 vaginal suppository compound. Insert vaginally on Monday Tuesday Thursday Friday  . NONFORMULARY OR COMPOUNDED ITEM Place 1 application onto the skin See admin instructions. Biest 8/2 HRT compound  Apply topically every day except Saturday.  . NONFORMULARY OR COMPOUNDED ITEM Apply 1 each topically See admin instructions. Progesterone 8%/10ml Apply 0.31mls topically every day except Saturday.  Vladimir Faster Glycol-Propyl Glycol (SYSTANE OP) Place 1 drop into both eyes daily.  . Probiotic Product (PROBIOTIC DAILY PO) Take 1  tablet by mouth daily.  . Turmeric 500 MG CAPS Take 500 mg by mouth 2 (two) times daily.   No current facility-administered medications on file prior to visit.    Allergies  Allergen Reactions  . Lipitor [Atorvastatin] Other (See Comments)    "neck pain"  . Metoprolol Other (See Comments)    DIDN'T WORK FOR PATIENT AND GAINED WEIGHT MIGHT HAVE BEEN TAKEN WITH RYTHMOL  . Rythmol [Propafenone] Other (See Comments)    "weight gain"  . Sulfa Drugs Cross Reactors Other (See Comments)    "soreness all over"   Past Medical History:  Diagnosis Date  . Arthritis    "probably in my knees" (10/25/2015)  . Asthma attack X 1 10/2015  . Atrial flutter ([place]) 10/2015  . Heart murmur   . Hepatitis 1972  . Hypothyroidism   . Malignant melanoma of leg ([place])    "right thigh"  . PAF (paroxysmal atrial fibrillation) ([place])   . Pneumonia 1966; 1967   "left lung collapsed one of these times"  . Psoriasis   . Rosacea   . Vitamin D deficiency    Health Maintenance  Topic Date Due  . PAP SMEAR  10/01/1979  . COLONOSCOPY  09/30/2008  . INFLUENZA VACCINE  11/14/2016  . MAMMOGRAM  12/06/2017  . TETANUS/TDAP  06/22/2025  . Hepatitis C Screening  Completed  . HIV Screening  Completed   Immunization History  Administered Date(s) Administered  . PPD Test 12/07/2013, 12/22/2014  . Tdap 06/23/2015   Past Surgical History:  Procedure Laterality Date  . CARDIOVERSION N/A 10/25/2015   Procedure: CARDIOVERSION;  Surgeon: Sueanne Margarita, MD;  Location: [facility] ENDOSCOPY;  Service: Cardiovascular;  Laterality: N/A;  . CERVICAL ABLATION  ~ 2012  . CESAREAN SECTION  1980  . CESAREAN SECTION WITH BILATERAL TUBAL LIGATION  1982  . ELECTROPHYSIOLOGIC STUDY N/A 01/13/2016   Procedure: Atrial Fibrillation Ablation;  Surgeon: Will Meredith Leeds, MD;  Location: [place] [facility];  Service: Cardiovascular;  Laterality: N/A;  . MELANOMA EXCISION Right 10/1975   outer thigh "malignant"  . REFRACTIVE SURGERY Bilateral  1990s  . TEE WITHOUT CARDIOVERSION N/A 10/25/2015   Procedure: TRANSESOPHAGEAL ECHOCARDIOGRAM (TEE);  Surgeon: Sueanne Margarita, MD;  Location: [facility] ENDOSCOPY;  Service: Cardiovascular;  Laterality: N/A;  . TEE WITHOUT CARDIOVERSION N/A 01/11/2016   Procedure: TRANSESOPHAGEAL ECHOCARDIOGRAM (TEE);  Surgeon: Josue Hector, MD;  Location: [facility] ENDOSCOPY;  Service: Cardiovascular;  Laterality: N/A;  . TONSILLECTOMY  1967   Family History  Problem Relation Age of Onset  . Heart disease Mother   . Hypertension Mother   . Heart attack Mother   . Heart disease Father   . Diabetes Father   . Hypertension Father   . Heart attack Brother   . Colon cancer Neg Hx   . Stroke Neg Hx    Social History  Substance Use Topics  . Smoking status: Never Smoker  .  Smokeless tobacco: Never Used  . Alcohol use 2.4 oz/week    2 Shots of liquor, 2 Glasses of wine per week     Comment: 10/25/2015 "drink socially on the weekends"    ROS Constitutional: Denies fever, chills, weight loss/gain, headaches, insomnia,  night sweats, and change in appetite. Does c/o fatigue. Eyes: Denies redness, blurred vision, diplopia, discharge, itchy, watery eyes.  ENT: Denies discharge, congestion, post nasal drip, epistaxis, sore throat, earache, hearing loss, dental pain, Tinnitus, Vertigo, Sinus pain, snoring.  Cardio: Denies chest pain, palpitations, irregular heartbeat, syncope, dyspnea, diaphoresis, orthopnea, PND, claudication, edema Respiratory: denies cough, dyspnea, DOE, pleurisy, hoarseness, laryngitis, wheezing.  Gastrointestinal: Denies dysphagia, heartburn, reflux, water brash, pain, cramps, nausea, vomiting, bloating, diarrhea, constipation, hematemesis, melena, hematochezia, jaundice, hemorrhoids Genitourinary: Denies dysuria, frequency, urgency, nocturia, hesitancy, discharge, hematuria, flank pain Breast: Breast lumps, nipple discharge, bleeding.  Musculoskeletal: Denies arthralgia, myalgia, stiffness, Jt. Swelling,  pain, limp, and strain/sprain. Denies falls. Skin: Denies puritis, rash, hives, warts, acne, eczema, changing in skin lesion Neuro: No weakness, tremor, incoordination, spasms, paresthesia, pain Psychiatric: Denies confusion, memory loss, sensory loss. Denies Depression. Endocrine: Denies change in weight, skin, hair change, nocturia, and paresthesia, diabetic polys, visual blurring, hyper / hypo glycemic episodes.  Heme/Lymph: No excessive bleeding, bruising, enlarged lymph nodes.  Physical Exam  BP 124/82   Pulse 72   Temp 97.4 F (36.3 C)   Resp 16   Ht 5' 5.75" (1.67 m)   Wt 224 lb (101.6 kg)   LMP 10/29/2013 (Approximate)   BMI 36.43 kg/m   General Appearance: Well nourished, well groomed and in no apparent distress.  Eyes: PERRLA, EOMs, conjunctiva no swelling or erythema, normal fundi and vessels. Sinuses: No frontal/maxillary tenderness ENT/Mouth: EACs patent / TMs  nl. Nares clear without erythema, swelling, mucoid exudates. Oral hygiene is good. No erythema, swelling, or exudate. Tongue normal, non-obstructing. Tonsils not swollen or erythematous. Hearing normal.  Neck: Supple, thyroid normal. No bruits, nodes or JVD. Respiratory: Respiratory effort normal.  BS equal and clear bilateral without rales, rhonci, wheezing or stridor. Cardio: Heart sounds are normal with regular rate and rhythm and no murmurs, rubs or gallops. Peripheral pulses are normal and equal bilaterally without edema. No aortic or femoral bruits. Chest: symmetric with normal excursions and percussion. Breasts: Symmetric, without lumps, nipple discharge, retractions, or fibrocystic changes.  Abdomen: Flat, soft with bowel sounds active. Nontender, no guarding, rebound, hernias, masses, or organomegaly.  Lymphatics: Non tender without lymphadenopathy.  Genitourinary:  Musculoskeletal: Full ROM all peripheral extremities, joint stability, 5/5 strength, and normal gait. Skin: Warm and dry without rashes,  lesions, cyanosis, clubbing or  ecchymosis.  Neuro: Cranial nerves intact, reflexes equal bilaterally. Normal muscle tone, no cerebellar symptoms. Sensation intact.  Pysch: Alert and oriented X 3, normal affect, Insight and Judgment appropriate.   Assessment and Plan  1. Annual Preventative Screening Examination  2. Essential hypertension  - EKG 12-Lead - [place], RETROPERITNL ABD,  LTD - Urinalysis, Routine w reflex microscopic - Microalbumin / creatinine urine ratio - CBC with Differential/Platelet - BASIC METABOLIC PANEL WITH GFR - Magnesium - TSH  3. Hyperlipidemia, mixed  - EKG 12-Lead - [place], RETROPERITNL ABD,  LTD - Hepatic function panel - Lipid panel - TSH  4. Abnormal glucose  - EKG 12-Lead - [place], RETROPERITNL ABD,  LTD - Hemoglobin A1c - Insulin, random  5. Vitamin D deficiency  - VITAMIN D 25 Hydroxy  6. Hypothyroidism  - TSH  7. Screening for rectal cancer  -  POC Hemoccult Bld/Stl  8. Screening for ischemic heart disease  - EKG 12-Lead  9. Screening for AAA (aortic abdominal aneurysm)  - [place], RETROPERITNL ABD,  LTD  10. Fatigue, unspecified type  - Vitamin B12 - Iron and TIBC - CBC with Differential/Platelet  11. Medication management  - Urinalysis, Routine w reflex microscopic - Microalbumin / creatinine urine ratio - CBC with Differential/Platelet - BASIC METABOLIC PANEL WITH GFR - Hepatic function panel - Magnesium - Lipid panel - TSH - Hemoglobin A1c - Insulin, random - VITAMIN D 25 Hydroxy   12. Screening examination for pulmonary tuberculosis  - PPD      Patient was counseled in prudent diet to achieve/maintain BMI less than 25 for weight control, BP monitoring, regular exercise and medications. Discussed med's effects and SE's. Screening labs and tests as requested with regular follow-up as recommended. Over 40 minutes of exam, counseling, chart review and high complex critical decision making was performed.

## 2016-08-16 NOTE — Patient Instructions (Signed)

## 2016-08-17 ENCOUNTER — Encounter: Payer: Self-pay | Admitting: Internal Medicine

## 2016-08-17 DIAGNOSIS — Z111 Encounter for screening for respiratory tuberculosis: Secondary | ICD-10-CM | POA: Diagnosis not present

## 2016-08-17 LAB — HEMOGLOBIN A1C
Hgb A1c MFr Bld: 5.3 % (ref <5.7)
Mean Plasma Glucose: 105 mg/dL

## 2016-08-17 LAB — URINALYSIS, ROUTINE W REFLEX MICROSCOPIC
Bilirubin Urine: NEGATIVE
Glucose, UA: NEGATIVE
Hgb urine dipstick: NEGATIVE
Ketones, ur: NEGATIVE
Leukocytes, UA: NEGATIVE
Nitrite: NEGATIVE
Protein, ur: NEGATIVE
Specific Gravity, Urine: 1.008 (ref 1.001–1.035)
pH: 6 (ref 5.0–8.0)

## 2016-08-17 LAB — VITAMIN B12: Vitamin B-12: 674 pg/mL (ref 200–1100)

## 2016-08-17 LAB — TSH: TSH: 2.11 mIU/L

## 2016-08-17 LAB — MICROALBUMIN / CREATININE URINE RATIO
Creatinine, Urine: 35 mg/dL (ref 20–320)
Microalb Creat Ratio: 9 mcg/mg creat (ref <30)
Microalb, Ur: 0.3 mg/dL

## 2016-08-17 LAB — MAGNESIUM: Magnesium: 2.4 mg/dL (ref 1.5–2.5)

## 2016-08-17 LAB — INSULIN, RANDOM: Insulin: 18.8 u[IU]/mL (ref 2.0–19.6)

## 2016-08-17 LAB — VITAMIN D 25 HYDROXY (VIT D DEFICIENCY, FRACTURES): Vit D, 25-Hydroxy: 84 ng/mL (ref 30–100)

## 2016-08-22 ENCOUNTER — Telehealth: Payer: Self-pay | Admitting: *Deleted

## 2016-08-22 NOTE — Telephone Encounter (Signed)
Left detailed message (DPR on file) informing pt Eliquis $10 co pay card left at front desk for the patient to pick up at her convenience. Asked pt to call office back if she would like mailed to home address and I would be glad to do that.

## 2016-08-23 ENCOUNTER — Telehealth: Payer: Self-pay | Admitting: Cardiology

## 2016-08-23 NOTE — Telephone Encounter (Signed)
Card placed in mail

## 2016-08-23 NOTE — Telephone Encounter (Signed)
New Message   pt verbalized that she is returning call for rn   She wants the card mailed to her

## 2016-09-06 ENCOUNTER — Encounter: Payer: Self-pay | Admitting: Internal Medicine

## 2016-09-26 ENCOUNTER — Other Ambulatory Visit: Payer: Self-pay | Admitting: Internal Medicine

## 2016-10-02 ENCOUNTER — Other Ambulatory Visit: Payer: Self-pay | Admitting: *Deleted

## 2016-10-03 ENCOUNTER — Telehealth: Payer: Self-pay | Admitting: *Deleted

## 2016-10-03 MED ORDER — PREDNISONE 10 MG PO TABS: ORAL_TABLET | ORAL | 0 refills | Status: DC

## 2016-10-03 MED ORDER — TRAMADOL HCL 50 MG PO TABS: ORAL_TABLET | ORAL | 0 refills | Status: DC

## 2016-10-03 NOTE — Telephone Encounter (Signed)
Patient called and states she is going to [place] on vacation and would like to have a pain RX, if needed, for her knee and back pain. The  Patient is on Eliquis, so she can not take the Ibuprofen 800 mg she requested.  Per Dr Melford Aase, an [place] has been sent in for Tramadol 50 mg and Prednisone 10 mg.  The patient was advised to take, if needed.

## 2016-10-24 ENCOUNTER — Other Ambulatory Visit: Payer: Self-pay | Admitting: Internal Medicine

## 2016-10-24 DIAGNOSIS — Z1231 Encounter for screening mammogram for malignant neoplasm of breast: Secondary | ICD-10-CM

## 2016-11-04 ENCOUNTER — Other Ambulatory Visit: Payer: Self-pay | Admitting: Internal Medicine

## 2016-12-02 ENCOUNTER — Other Ambulatory Visit: Payer: Self-pay | Admitting: Internal Medicine

## 2016-12-02 ENCOUNTER — Other Ambulatory Visit: Payer: Self-pay | Admitting: Cardiology

## 2016-12-02 NOTE — Progress Notes (Addendum)
Cardiology Office Note Date:  12/05/2016  Patient ID:  Aristea, Posada 1961-03-24, MRN [id number] PCP:  Unk Pinto, MD  Cardiologist:  Dr. Radford Pax Electrophysiologist: Dr. Curt Bears   Chief Complaint: palpitations, more AF symptoms  History of Present Illness: Aybree Lanyon is a 56 y.o. female with history of persistent [place], historically on [place] with breakthrough Af and underwent ablation 01/13/16 and Tikosyn d/c'd in Dec, she has Rx for Multaq PRN for palpitations, hypothyroidism, asthma, HTN, mild OSA trying lifestyle measures prior to consideration of CPAP.  She comes in today to be seen for Dr. Curt Bears, last seen by him in March, at that time doing well without need for her PRN med.  She has felt like she has been having AF.  In the last 3-4 weeks, she has had to use the [facility] and it has settled it done after a little while, but Saturday started and despite taking the medicine did not stop and has been taking it BID starting Saturday as well as her caoreg 6.25mg  BID rather then 1/2 tab.  The AF makes her feel winded easily and much more tired then usual.  No CP, no near syncope or syncope.  She reports compliance with her Eliquis, no missed doses in the last 4 weeks for sure if she has ever missed a dose.  No bleeding or signs of bleeding.  AFib Hx: AAD Tikosyn, d/c Dec after ablation >> PRN Multaq EPS, ablation 12/2015  Past Medical History:  Diagnosis Date  . Arthritis    "probably in my knees" (10/25/2015)  . Asthma attack X 1 10/2015  . Atrial flutter ([place]) 10/2015  . Heart murmur   . Hepatitis 1972  . Hypothyroidism   . Malignant melanoma of leg ([place])    "right thigh"  . PAF (paroxysmal atrial fibrillation) ([place])   . Pneumonia 1966; 1967   "left lung collapsed one of these times"  . Psoriasis   . Rosacea   . Vitamin D deficiency     Past Surgical History:  Procedure Laterality Date  . CARDIOVERSION N/A 10/25/2015   Procedure: CARDIOVERSION;  Surgeon: Sueanne Margarita, MD;  Location: [place] ENDOSCOPY;  Service: Cardiovascular;  Laterality: N/A;  . CERVICAL ABLATION  ~ 2012  . CESAREAN SECTION  1980  . CESAREAN SECTION WITH BILATERAL TUBAL LIGATION  1982  . ELECTROPHYSIOLOGIC STUDY N/A 01/13/2016   Procedure: Atrial Fibrillation Ablation;  Surgeon: Will Meredith Leeds, MD;  Location: [place] [facility];  Service: Cardiovascular;  Laterality: N/A;  . MELANOMA EXCISION Right 10/1975   outer thigh "malignant"  . REFRACTIVE SURGERY Bilateral 1990s  . TEE WITHOUT CARDIOVERSION N/A 10/25/2015   Procedure: TRANSESOPHAGEAL ECHOCARDIOGRAM (TEE);  Surgeon: Sueanne Margarita, MD;  Location: [facility] ENDOSCOPY;  Service: Cardiovascular;  Laterality: N/A;  . TEE WITHOUT CARDIOVERSION N/A 01/11/2016   Procedure: TRANSESOPHAGEAL ECHOCARDIOGRAM (TEE);  Surgeon: Josue Hector, MD;  Location: [facility] ENDOSCOPY;  Service: Cardiovascular;  Laterality: N/A;  . TONSILLECTOMY  1967    Current Outpatient Prescriptions  Medication Sig Dispense Refill  . augmented betamethasone dipropionate (DIPROLENE-AF) 0.05 % cream Apply 1 application topically daily as needed (Psoriasis).    . Calcium 600-400 MG-UNIT CHEW Chew 1 each by mouth 2 (two) times daily.     . carvedilol (COREG) 6.25 MG tablet Take 6.25 mg by mouth 2 (two) times daily with a meal.     . Cholecalciferol (VITAMIN D PO) Take 10,000 Units by mouth daily.     . Coenzyme Q10 (  CO Q10) 100 MG CAPS Take 100 mg by mouth daily.     Marland Kitchen dronedarone (MULTAQ) 400 MG tablet Take 1 tablet (400 mg total) by mouth 2 (two) times daily as needed. For AFib episode/s 10 tablet 1  . ELIQUIS 5 MG TABS tablet TAKE 1 TABLET (5 MG TOTAL) BY MOUTH 2 (TWO) TIMES DAILY. 60 tablet 5  . furosemide (LASIX) 20 MG tablet Take 1 tablet (20 mg total) by mouth as needed (take 1 tablet daily as needed for 3-5lb weight gain, increased swelling). 30 tablet 2  . levothyroxine (SYNTHROID, LEVOTHROID) 50 MCG tablet TAKE 1 TABLET (50 MCG TOTAL) BY MOUTH DAILY. 90 tablet 1  .  lisinopril (PRINIVIL,ZESTRIL) 5 MG tablet TAKE 1 TABLET (5 MG TOTAL) BY MOUTH DAILY. 90 tablet 1  . loratadine (CLARITIN) 10 MG tablet TAKE 1 TABLET BY MOUTH DAILY AS NEEDED FOR ALLERGY RELIEF  0  . Magnesium 250 MG TABS Take 500 mg by mouth every evening.     . montelukast (SINGULAIR) 10 MG tablet TAKE 1 TABLET BY MOUTH EVERY DAY 90 tablet 1  . Multiple Vitamin (MULTIVITAMIN WITH MINERALS) TABS tablet Take 1 tablet by mouth daily.    . NONFORMULARY OR COMPOUNDED ITEM Place 1 each vaginally See admin instructions. Estriol/testosterone 0.25-0.25 vaginal suppository compound. Insert vaginally on Monday Tuesday Thursday Friday    . NONFORMULARY OR COMPOUNDED ITEM Place 1 application onto the skin See admin instructions. Biest 8/2 HRT compound  Apply topically every day except Saturday.    . NONFORMULARY OR COMPOUNDED ITEM Apply 1 each topically See admin instructions. Progesterone 8%/88ml Apply 0.71mls topically every day except Saturday.    Vladimir Faster Glycol-Propyl Glycol (SYSTANE OP) Place 1 drop into both eyes daily.    . Probiotic Product (PROBIOTIC DAILY PO) Take 1 tablet by mouth daily.    . traMADol (ULTRAM) 50 MG tablet Take 1 tablet qid PRN for pain. 30 tablet 0  . Turmeric 500 MG CAPS Take 500 mg by mouth 2 (two) times daily.     No current facility-administered medications for this visit.     Allergies:   Lipitor [atorvastatin]; Metoprolol; Rythmol [propafenone]; and Sulfa drugs cross reactors   Social History:  The patient  reports that she has never smoked. She has never used smokeless tobacco. She reports that she drinks about 2.4 oz of alcohol per week . She reports that she does not use drugs.   Family History:  The patient's family history includes Diabetes in her father; Heart attack in her brother and mother; Heart disease in her father and mother; Hypertension in her father and mother.  ROS:  Please see the history of present illness. All other systems are reviewed and  otherwise negative.   PHYSICAL EXAM:  VS:  BP 112/76   Pulse 98   Ht 5' 5.75" (1.67 m)   Wt 228 lb (103.4 kg)   LMP 10/29/2013 (Approximate)   SpO2 97%   BMI 37.08 kg/m  BMI: Body mass index is 37.08 kg/m. Well nourished, well developed, in no acute distress  HEENT: normocephalic, atraumatic  Neck: no JVD, carotid bruits or masses Cardiac:  IRRR; no significant murmurs, no rubs, or gallops Lungs:  CTA b/l, no wheezing, rhonchi or rales  Abd: soft, nontender, obese MS: no deformity or atrophy Ext: no edema  Skin: warm and dry, no rash Neuro:  No gross deficits appreciated Psych: euthymic mood, full affect   EKG:  Done today and reviewed by myself shows Aflutter (  typical), 98bpm, QRS 62ms, QTc 460ms  01/13/16: EPS/ablation CONCLUSIONS: 1. Sinus rhythm upon presentation.   2. Successful electrical isolation and anatomical encircling of all four pulmonary veins with radiofrequency current. 3. No inducible arrhythmias following ablation both on and off of dobutamine 4. No early apparent complications.  TTE 10/26/15  Review of the above records today demonstrates:  - Left ventricle: The cavity size was normal. Systolic function was moderately reduced. The estimated ejection fraction was in the range of 35% to 40%. Diffuse hypokinesis. Features are consistent with a pseudonormal left ventricular filling pattern, with concomitant abnormal relaxation and increased filling pressure (grade 2 diastolic dysfunction). Doppler parameters are consistent with elevated ventricular end-diastolic filling pressure. - Aortic valve: Trileaflet; normal thickness leaflets. There was no regurgitation. - Aortic root: The aortic root was normal in size. - Ascending aorta: The ascending aorta was normal in size. - Mitral valve: There was mild regurgitation. - Left atrium: The atrium was mildly dilated. - Tricuspid valve: There was mild regurgitation. - Pulmonary arteries: Systolic  pressure was mildly increased. PA peak pressure: 38 mm Hg (S). - Inferior vena cava: The vessel was normal in size. - Pericardium, extracardiac: A trivial pericardial effusion was identified posterior to the heart.   Recent Labs: 08/16/2016: ALT 13; Magnesium 2.4; TSH 2.11 12/05/2016: BUN 19; Creatinine, Ser 1.06; Hemoglobin WILL FOLLOW; Platelets WILL FOLLOW; Potassium 4.9; Sodium 140  08/16/2016: Cholesterol 218; HDL 38; LDL Cholesterol 134; Total CHOL/HDL Ratio 5.7; Triglycerides 230; VLDL 46   Estimated Creatinine Clearance: 70 mL/min (A) (by C-G formula based on SCr of 1.06 mg/dL (H)).   Wt Readings from Last 3 Encounters:  12/05/16 228 lb (103.4 kg)  08/16/16 224 lb (101.6 kg)  07/11/16 220 lb (99.8 kg)     Other studies reviewed: Additional studies/records reviewed today include: summarized above  ASSESSMENT AND PLAN:  1. Persistent Afib     CHA2DS2Vasc is at least 2, on Eliquis, appropriately dosed     AFib ablation 12/2015  2. AFlutter today     Discussed tx options, she would much rather have an ablation then stay chronically on a AAD drug if possible.  She will continue taking the Multaq BID for now, she has not missed any doses of her Eliquis in the last 4 weeks and will plan for DCCV.  She will keep her appt with Dr. Curt Bears in October to discuss ablation.  She is instructed and counseled regarding importance of not missing any Eliquis doses   3. HTN      Looks OK, no changes  Disposition: as above, sooner if needed  ADDEND: I discussed at the time of the visit, DCCV procedure, risks, benefits, she has had previously without issue and wants to proceed.  Current medicines are reviewed at length with the patient today.  The patient did not have any concerns regarding medicines.  Haywood Lasso, PA-C 12/05/2016 4:15 PM     [address] [place] [address] [phone] (office)  [phone] (fax)

## 2016-12-03 NOTE — Telephone Encounter (Signed)
Eliquis refill request; pt is 56 yrs old, wt-101.6kg, Crea-0.96 on 08/16/16, last seen by Dr. Curt Bears on 07/11/16. Will send in refill request to requested pharmacy.

## 2016-12-05 ENCOUNTER — Ambulatory Visit (INDEPENDENT_AMBULATORY_CARE_PROVIDER_SITE_OTHER): Payer: 59 | Admitting: Physician Assistant

## 2016-12-05 ENCOUNTER — Encounter: Payer: Self-pay | Admitting: Physician Assistant

## 2016-12-05 VITALS — BP 112/76 | HR 98 | Ht 65.75 in | Wt 228.0 lb

## 2016-12-05 DIAGNOSIS — I10 Essential (primary) hypertension: Secondary | ICD-10-CM | POA: Diagnosis not present

## 2016-12-05 DIAGNOSIS — I481 Persistent atrial fibrillation: Secondary | ICD-10-CM

## 2016-12-05 DIAGNOSIS — I483 Typical atrial flutter: Secondary | ICD-10-CM | POA: Diagnosis not present

## 2016-12-05 DIAGNOSIS — I4819 Other persistent atrial fibrillation: Secondary | ICD-10-CM

## 2016-12-05 NOTE — Patient Instructions (Addendum)
Medication Instructions:  Your physician recommends that you continue on your current medications as directed. Please refer to the Current Medication list given to you today.   Labwork: Your physician recommends that you return for lab work today for BMET, CBC, PT/INR  Testing/Procedures: Your physician has recommended that you have a Cardioversion (DCCV). Electrical Cardioversion uses a jolt of electricity to your heart either through paddles or wired patches attached to your chest. This is a controlled, usually prescheduled, procedure. Defibrillation is done under light anesthesia in the hospital, and you usually go home the day of the procedure. This is done to get your heart back into a normal rhythm. You are not awake for the procedure. Please see the instruction sheet given to you today.  Nothing to eat or drink after midnight the night before Arrive at the [facility] at 8:00 AM for a 10:00 AM procedure time Please have someone to drive you home after procedure and stay with you for the first 24 hours You may take your medications the morning of your procedure.     If you need a refill on your cardiac medications before your next appointment, please call your pharmacy.

## 2016-12-06 LAB — BASIC METABOLIC PANEL
BUN/Creatinine Ratio: 18 (ref 9–23)
BUN: 19 mg/dL (ref 6–24)
CO2: 22 mmol/L (ref 20–29)
Calcium: 9.6 mg/dL (ref 8.7–10.2)
Chloride: 101 mmol/L (ref 96–106)
Creatinine, Ser: 1.06 mg/dL — ABNORMAL HIGH (ref 0.57–1.00)
GFR calc Af Amer: 67 mL/min/{1.73_m2} (ref >59)
GFR calc non Af Amer: 58 mL/min/{1.73_m2} — ABNORMAL LOW (ref >59)
Glucose: 99 mg/dL (ref 65–99)
Potassium: 4.9 mmol/L (ref 3.5–5.2)
Sodium: 140 mmol/L (ref 134–144)

## 2016-12-06 LAB — CBC
Hematocrit: 41.9 % (ref 34.0–46.6)
Hemoglobin: 13.8 g/dL (ref 11.1–15.9)
MCH: 29 pg (ref 26.6–33.0)
MCHC: 32.9 g/dL (ref 31.5–35.7)
MCV: 88 fL (ref 79–97)
Platelets: 331 10*3/uL (ref 150–379)
RBC: 4.76 x10E6/uL (ref 3.77–5.28)
RDW: 13.9 % (ref 12.3–15.4)
WBC: 10.8 10*3/uL (ref 3.4–10.8)

## 2016-12-06 LAB — PROTIME-INR
INR: 1 (ref 0.8–1.2)
Prothrombin Time: 10.7 s (ref 9.1–12.0)

## 2016-12-07 ENCOUNTER — Ambulatory Visit
Admission: RE | Admit: 2016-12-07 | Discharge: 2016-12-07 | Disposition: A | Discharge status: Home / Self Care | Payer: 59 | Source: Ambulatory Visit | Attending: Internal Medicine | Admitting: Internal Medicine

## 2016-12-07 DIAGNOSIS — Z1231 Encounter for screening mammogram for malignant neoplasm of breast: Secondary | ICD-10-CM

## 2016-12-12 ENCOUNTER — Telehealth: Payer: Self-pay | Admitting: Cardiology

## 2016-12-12 DIAGNOSIS — I4819 Other persistent atrial fibrillation: Secondary | ICD-10-CM

## 2016-12-12 NOTE — Telephone Encounter (Signed)
Patient states that she has been in and out of Afib and is not sure if she should proceed with her DCCV on 8/31. Sh is asking if it is okay to wait until she sees Dr. Curt Bears in October. Also, the patient is taking multaq 400 mg (1 tab) BID and wants to know if she can take an extra 200 mg (1/2 tab) at lunch time. Made patient information would be forwarded to Dr. Curt Bears and his RN for review and recommendation. Patient verbalized understanding.

## 2016-12-12 NOTE — Telephone Encounter (Signed)
New message   Has cardio version schedule 12/14/16 she is in normal rhythm right now , should she cancel ? Please call   Wants to change her Multaq dosage by adding a 1/2 a pill at lunch time.

## 2016-12-13 MED ORDER — DRONEDARONE HCL 400 MG PO TABS: 400.0000 mg | ORAL_TABLET | Freq: Two times a day (BID) | ORAL | 0 refills | Status: DC

## 2016-12-13 MED ORDER — DRONEDARONE HCL 400 MG PO TABS: 400.0000 mg | ORAL_TABLET | Freq: Two times a day (BID) | ORAL | 1 refills | Status: DC

## 2016-12-13 NOTE — Telephone Encounter (Signed)
Monica Morgan is wanting to speak to you about cancelling her the cardioversion and about her prescription (Multaq  400mg ) which has not been sent to [facility] and she will be out of it by her Wednesday dose . (Tuesday Evening will be last dose) .  She is going to need prior Auth and [facility] says that it has not been started . She says she will need a 30 day supply called into [facility] on [address] or [facility] in [place] . Please call

## 2016-12-13 NOTE — Telephone Encounter (Signed)
Pt in NSR, DCCV cancelled for tomorrow (Dr. Curt Bears aware and approved). Pt asks that rx for Multaq be sent to [facility] as she is almost out of the drug. Will send to [facility] and leave 2 weeks of samples at front desk while we get prior auth completed. Will call pt to schedule OV w/ [place] once echo is scheduled. Patient verbalized understanding and agreeable to plan.

## 2016-12-14 ENCOUNTER — Ambulatory Visit (HOSPITAL_COMMUNITY): Admission: RE | Admit: 2016-12-14 | Payer: 59 | Source: Ambulatory Visit | Admitting: Cardiovascular Disease

## 2016-12-14 ENCOUNTER — Encounter (HOSPITAL_COMMUNITY): Admission: RE | Payer: Self-pay | Source: Ambulatory Visit

## 2016-12-14 ENCOUNTER — Telehealth: Payer: Self-pay | Admitting: *Deleted

## 2016-12-14 SURGERY — CARDIOVERSION
Anesthesia: General

## 2016-12-14 MED ORDER — DRONEDARONE HCL 400 MG PO TABS: 400.0000 mg | ORAL_TABLET | Freq: Two times a day (BID) | ORAL | 1 refills | Status: DC

## 2016-12-14 MED ORDER — DRONEDARONE HCL 400 MG PO TABS: 400.0000 mg | ORAL_TABLET | Freq: Two times a day (BID) | ORAL | 0 refills | Status: DC

## 2016-12-14 NOTE — Telephone Encounter (Signed)
Scheduled f/u OV w/ Camntiz for week after echo. Pt is agreeable.  Multaq sent to wrong pharmacy.  Will send both rx again.

## 2016-12-14 NOTE — Telephone Encounter (Signed)
[place] [facility] states that this patient needs Prior Authorization for the medication MULTAQ 400 mg. The number to reach them is [phone].

## 2016-12-14 NOTE — Telephone Encounter (Signed)
F/u message ° °Pt returning Rn call .please call back to discuss  °

## 2016-12-19 ENCOUNTER — Other Ambulatory Visit: Payer: Self-pay

## 2016-12-19 ENCOUNTER — Ambulatory Visit (HOSPITAL_COMMUNITY): Payer: 59 | Attending: Cardiology

## 2016-12-19 DIAGNOSIS — I4819 Other persistent atrial fibrillation: Secondary | ICD-10-CM

## 2016-12-19 DIAGNOSIS — J45909 Unspecified asthma, uncomplicated: Secondary | ICD-10-CM | POA: Diagnosis not present

## 2016-12-19 DIAGNOSIS — K759 Inflammatory liver disease, unspecified: Secondary | ICD-10-CM | POA: Diagnosis not present

## 2016-12-19 DIAGNOSIS — I340 Nonrheumatic mitral (valve) insufficiency: Secondary | ICD-10-CM | POA: Insufficient documentation

## 2016-12-19 DIAGNOSIS — Z8249 Family history of ischemic heart disease and other diseases of the circulatory system: Secondary | ICD-10-CM | POA: Diagnosis not present

## 2016-12-19 DIAGNOSIS — I4892 Unspecified atrial flutter: Secondary | ICD-10-CM | POA: Insufficient documentation

## 2016-12-19 DIAGNOSIS — I481 Persistent atrial fibrillation: Secondary | ICD-10-CM | POA: Diagnosis not present

## 2016-12-19 DIAGNOSIS — R002 Palpitations: Secondary | ICD-10-CM | POA: Insufficient documentation

## 2016-12-19 NOTE — Telephone Encounter (Signed)
I have done a Multaq PA over the phone with Deamber at [facility]. Per Deamber we should have answer within 5 days.

## 2016-12-21 ENCOUNTER — Encounter: Payer: Self-pay | Admitting: Cardiology

## 2016-12-21 ENCOUNTER — Telehealth: Payer: Self-pay

## 2016-12-21 NOTE — Telephone Encounter (Signed)
F/u message  Pt returning RN call about echo results. Please call back to discuss

## 2016-12-21 NOTE — Telephone Encounter (Signed)
This encounter was created in error - please disregard.

## 2016-12-21 NOTE — Telephone Encounter (Signed)
Approval of Multaq received from [facility]. Valid through 12/19/2017. Local pharmacy notified.

## 2016-12-24 NOTE — Telephone Encounter (Signed)
Patient notified this was approved. 90 day supply already sent to [facility].

## 2016-12-26 ENCOUNTER — Other Ambulatory Visit: Payer: Self-pay | Admitting: Cardiology

## 2016-12-26 ENCOUNTER — Encounter: Payer: Self-pay | Admitting: Cardiology

## 2016-12-26 ENCOUNTER — Ambulatory Visit (INDEPENDENT_AMBULATORY_CARE_PROVIDER_SITE_OTHER): Payer: 59 | Admitting: Cardiology

## 2016-12-26 VITALS — BP 122/82 | HR 64 | Ht 66.0 in | Wt 229.8 lb

## 2016-12-26 DIAGNOSIS — I480 Paroxysmal atrial fibrillation: Secondary | ICD-10-CM | POA: Diagnosis not present

## 2016-12-26 DIAGNOSIS — G4733 Obstructive sleep apnea (adult) (pediatric): Secondary | ICD-10-CM

## 2016-12-26 DIAGNOSIS — I483 Typical atrial flutter: Secondary | ICD-10-CM

## 2016-12-26 NOTE — Progress Notes (Signed)
Electrophysiology Office Note   Date:  12/26/2016   ID:  Trystin Hargrove, DOB 08-16-1960, MRN [id number]  PCP:  Unk Pinto, MD  Cardiologist:  Radford Pax Primary Electrophysiologist:  Fidencio Duddy Meredith Leeds, MD    Chief Complaint  Patient presents with  . Follow-up    Persistent Afib/Typical AFlutter  . Shortness of Breath     History of Present Illness: Monica Morgan is a 56 y.o. female who presents today for electrophysiology evaluation.   Recent admission for tikosyn loading.  Had TEE/CV which restored sinus rhythm.  TEE noted EF 35-40%.  Discharged in sinus rhythm.  Continuing to have AF symptoms. Had AF ablation 01/13/16. Taken off tikosyn 12/17. She is felt well since last being seen. She presented to clinic in atrial flutter. She was in and out of atrial flutter and thus no cardioversion was performed.  Today, denies symptoms of palpitations, chest pain, shortness of breath, orthopnea, PND, lower extremity edema, claudication, dizziness, presyncope, syncope, bleeding, or neurologic sequela. The patient is tolerating medications without difficulties. He does feel weak and fatigued since starting the carvedilol and lisinopril. She does not want to be on antiarrhythmics at this time.    Past Medical History:  Diagnosis Date  . Arthritis    "probably in my knees" (10/25/2015)  . Asthma attack X 1 10/2015  . Atrial flutter ([place]) 10/2015  . Heart murmur   . Hepatitis 1972  . Hypothyroidism   . Malignant melanoma of leg ([place])    "right thigh"  . PAF (paroxysmal atrial fibrillation) ([place])   . Pneumonia 1966; 1967   "left lung collapsed one of these times"  . Psoriasis   . Rosacea   . Vitamin D deficiency    Past Surgical History:  Procedure Laterality Date  . CARDIOVERSION N/A 10/25/2015   Procedure: CARDIOVERSION;  Surgeon: Sueanne Margarita, MD;  Location: [place] ENDOSCOPY;  Service: Cardiovascular;  Laterality: N/A;  . CERVICAL ABLATION  ~ 2012  . CESAREAN SECTION  1980  .  CESAREAN SECTION WITH BILATERAL TUBAL LIGATION  1982  . ELECTROPHYSIOLOGIC STUDY N/A 01/13/2016   Procedure: Atrial Fibrillation Ablation;  Surgeon: Chriss Redel Meredith Leeds, MD;  Location: [place] CV LAB;  Service: Cardiovascular;  Laterality: N/A;  . MELANOMA EXCISION Right 10/1975   outer thigh "malignant"  . REFRACTIVE SURGERY Bilateral 1990s  . TEE WITHOUT CARDIOVERSION N/A 10/25/2015   Procedure: TRANSESOPHAGEAL ECHOCARDIOGRAM (TEE);  Surgeon: Sueanne Margarita, MD;  Location: [facility] ENDOSCOPY;  Service: Cardiovascular;  Laterality: N/A;  . TEE WITHOUT CARDIOVERSION N/A 01/11/2016   Procedure: TRANSESOPHAGEAL ECHOCARDIOGRAM (TEE);  Surgeon: Josue Hector, MD;  Location: [facility] ENDOSCOPY;  Service: Cardiovascular;  Laterality: N/A;  . TONSILLECTOMY  1967     Current Outpatient Prescriptions  Medication Sig Dispense Refill  . augmented betamethasone dipropionate (DIPROLENE-AF) 0.05 % cream Apply 1 application topically daily as needed (Psoriasis).    . Calcium 600-400 MG-UNIT CHEW Chew 1 each by mouth 2 (two) times daily.     . carvedilol (COREG) 6.25 MG tablet Take 6.25 mg by mouth 2 (two) times daily with a meal.     . Cholecalciferol (VITAMIN D PO) Take 10,000 Units by mouth daily.     . Coenzyme Q10 (CO Q10) 100 MG CAPS Take 100 mg by mouth daily.     Marland Kitchen dronedarone (MULTAQ) 400 MG tablet Take 1 tablet (400 mg total) by mouth 2 (two) times daily with a meal. 60 tablet 0  . ELIQUIS 5 MG TABS tablet TAKE  1 TABLET (5 MG TOTAL) BY MOUTH 2 (TWO) TIMES DAILY. 60 tablet 5  . furosemide (LASIX) 20 MG tablet Take 1 tablet (20 mg total) by mouth as needed (take 1 tablet daily as needed for 3-5lb weight gain, increased swelling). 30 tablet 2  . levothyroxine (SYNTHROID, LEVOTHROID) 50 MCG tablet TAKE 1 TABLET (50 MCG TOTAL) BY MOUTH DAILY. 90 tablet 1  . lisinopril (PRINIVIL,ZESTRIL) 5 MG tablet TAKE 1 TABLET (5 MG TOTAL) BY MOUTH DAILY. 90 tablet 1  . loratadine (CLARITIN) 10 MG tablet TAKE 1 TABLET BY MOUTH  DAILY AS NEEDED FOR ALLERGY RELIEF  0  . Magnesium 250 MG TABS Take 500 mg by mouth every evening.     . montelukast (SINGULAIR) 10 MG tablet TAKE 1 TABLET BY MOUTH EVERY DAY 90 tablet 1  . Multiple Vitamin (MULTIVITAMIN WITH MINERALS) TABS tablet Take 1 tablet by mouth daily.    . NONFORMULARY OR COMPOUNDED ITEM Place 1 each vaginally See admin instructions. Estriol/testosterone 0.25-0.25 vaginal suppository compound. Insert vaginally on Monday Tuesday Thursday Friday    . NONFORMULARY OR COMPOUNDED ITEM Place 1 application onto the skin See admin instructions. Biest 8/2 HRT compound  Apply topically every day except Saturday.    . NONFORMULARY OR COMPOUNDED ITEM Apply 1 each topically See admin instructions. Progesterone 8%/15ml Apply 0.15mls topically every day except Saturday.    Vladimir Faster Glycol-Propyl Glycol (SYSTANE OP) Place 1 drop into both eyes daily.    . Probiotic Product (PROBIOTIC DAILY PO) Take 1 tablet by mouth daily.    . traMADol (ULTRAM) 50 MG tablet Take 1 tablet qid PRN for pain. 30 tablet 0  . Turmeric 500 MG CAPS Take 500 mg by mouth 2 (two) times daily.     No current facility-administered medications for this visit.     Allergies:   Lipitor [atorvastatin]; Metoprolol; Rythmol [propafenone]; and Sulfa drugs cross reactors   Social History:  The patient  reports that she has never smoked. She has never used smokeless tobacco. She reports that she drinks about 2.4 oz of alcohol per week . She reports that she does not use drugs.   Family History:  The patient's family history includes Diabetes in her father; Heart attack in her brother and mother; Heart disease in her father and mother; Hypertension in her father and mother.    ROS:  Please see the history of present illness.   Otherwise, review of systems is positive for Palpitations, cough, back pain, dizziness, balance problems.   All other systems are reviewed and negative.   PHYSICAL EXAM: VS:  BP 122/82    Pulse 64   Ht 5\' 6"  (1.676 m)   Wt 229 lb 12.8 oz (104.2 kg)   LMP 10/29/2013 (Approximate)   BMI 37.09 kg/m  , BMI Body mass index is 37.09 kg/m. GEN: Well nourished, well developed, in no acute distress  HEENT: normal  Neck: no JVD, carotid bruits, or masses Cardiac: RRR; no murmurs, rubs, or gallops,no edema  Respiratory:  clear to auscultation bilaterally, normal work of breathing GI: soft, nontender, nondistended, + BS MS: no deformity or atrophy  Skin: warm and dry Neuro:  Strength and sensation are intact Psych: euthymic mood, full affect  EKG:  EKG is not ordered today. Personal review of the ekg ordered 12/05/16 shows atrial flutter, rate 98    Recent Labs: 08/16/2016: ALT 13; Magnesium 2.4; TSH 2.11 12/05/2016: BUN 19; Creatinine, Ser 1.06; Hemoglobin 13.8; Platelets 331; Potassium 4.9; Sodium 140  Lipid Panel     Component Value Date/Time   CHOL 218 (H) 08/16/2016 1647   TRIG 230 (H) 08/16/2016 1647   HDL 38 (L) 08/16/2016 1647   CHOLHDL 5.7 (H) 08/16/2016 1647   VLDL 46 (H) 08/16/2016 1647   LDLCALC 134 (H) 08/16/2016 1647     Wt Readings from Last 3 Encounters:  12/26/16 229 lb 12.8 oz (104.2 kg)  12/05/16 228 lb (103.4 kg)  08/16/16 224 lb (101.6 kg)      Other studies Reviewed: Additional studies/ records that were reviewed today include: TTE 12/19/16 Review of the above records today demonstrates:  - Left ventricle: The cavity size was normal. Wall thickness was   normal. Systolic function was normal. The estimated ejection   fraction was in the range of 55% to 60%. Wall motion was normal;   there were no regional wall motion abnormalities. Features are   consistent with a pseudonormal left ventricular filling pattern,   with concomitant abnormal relaxation and increased filling   pressure (grade 2 diastolic dysfunction). - Aortic valve: There was no stenosis. - Mitral valve: There was mild regurgitation. - Left atrium: The atrium was mildly  dilated. - Right ventricle: The cavity size was normal. Systolic function   was normal. - Right atrium: The atrium was mildly dilated. - Tricuspid valve: Peak RV-RA gradient (S): 32 mm Hg. - Pulmonary arteries: PA peak pressure: 35 mm Hg (S). - Inferior vena cava: The vessel was normal in size. The   respirophasic diameter changes were in the normal range (>= 50%),   consistent with normal central venous pressure.   ASSESSMENT AND PLAN:  1.  Persistent atrial fibrillation: On Eliquis. Had ablation 01/13/16. No obvious recurrence.  This patients CHA2DS2-VASc Score and unadjusted Ischemic Stroke Rate (% per year) is equal to 3.2 % stroke rate/year from a score of 3  Above score calculated as 1 point each if present [CHF, HTN, DM, Vascular=MI/PAD/Aortic Plaque, Age if 65-74, or Female] Above score calculated as 2 points each if present [Age > 75, or Stroke/TIA/TE]  2. Hypertension: Well-controlled today  3. Mild OSA: Trying conservative measures prior to using CPAP such as weight loss.  4. Atrial flutter, typical: Has not had cardioversion as she is in and out of rhythm. Discussed antiarrhythmics versus ablation. Risks and benefits of ablation were discussed. Risks include bleeding, tamponade, heart block, stroke, and damage to surrounding organs. She understands these risks and has agreed to the procedure.  Current medicines are reviewed at length with the patient today.   The patient does not have concerns regarding her medicines.  The following changes were made today:  none  Labs/ tests ordered today include:  No orders of the defined types were placed in this encounter.    Disposition:   FU with Brylan Seubert 3 months  Signed, Chandani Rogowski Meredith Leeds, MD  12/26/2016 12:36 PM     [address] [place] [address] [phone] (office) [phone] (fax)

## 2016-12-28 ENCOUNTER — Telehealth: Payer: Self-pay | Admitting: *Deleted

## 2016-12-28 ENCOUNTER — Encounter: Payer: Self-pay | Admitting: *Deleted

## 2016-12-28 NOTE — Telephone Encounter (Signed)
Scheduled AFlutter ablation 10/26 H&P and pre procedure labs scheduled for 10/22. Letter of instructions reviewed with patient and sent via MyChart and by mail (per pt request). Pt requesting later procedure time and understands I will call her if I am able to schedule another procedure and move her case to a later time.  She is appreciative. Patient verbalized understanding and agreeable to plan.

## 2017-01-14 ENCOUNTER — Ambulatory Visit: Payer: 59 | Admitting: Cardiology

## 2017-01-21 ENCOUNTER — Encounter: Payer: Self-pay | Admitting: *Deleted

## 2017-02-03 NOTE — Progress Notes (Signed)
Electrophysiology Office Note   Date:  02/04/2017   ID:  Monica Morgan, DOB 02-Jan-1961, MRN [id number]  PCP:  Unk Pinto, MD  Cardiologist:  Radford Pax Primary Electrophysiologist:  Emmauel Hallums Meredith Leeds, MD    Chief Complaint  Patient presents with  . Follow-up    Typical AFlutter/ H&P Pre procedure labs     History of Present Illness: Monica Morgan is a 56 y.o. female who presents today for electrophysiology evaluation.   Recent admission for tikosyn loading.  Had TEE/CV which restored sinus rhythm.  TEE noted EF 35-40%.  Discharged in sinus rhythm.  Continuing to have AF symptoms. Had AF ablation 01/13/16. Taken off tikosyn 12/17. She is felt well since last being seen. She presented to clinic in atrial flutter. She was in and out of atrial flutter and thus no cardioversion was performed. Plan for atrial flutter ablation 02/08/17.  Today, denies symptoms of palpitations, chest pain, shortness of breath, orthopnea, PND, lower extremity edema, claudication, dizziness, presyncope, syncope, bleeding, or neurologic sequela. The patient is tolerating medications without difficulties. She continues to feel fatigued and weak. She is anxious about the ablation.     Past Medical History:  Diagnosis Date  . Arthritis    "probably in my knees" (10/25/2015)  . Asthma attack X 1 10/2015  . Atrial flutter ([place]) 10/2015  . Heart murmur   . Hepatitis 1972  . Hypothyroidism   . Malignant melanoma of leg ([place])    "right thigh"  . PAF (paroxysmal atrial fibrillation) ([place])   . Pneumonia 1966; 1967   "left lung collapsed one of these times"  . Psoriasis   . Rosacea   . Vitamin D deficiency    Past Surgical History:  Procedure Laterality Date  . CARDIOVERSION N/A 10/25/2015   Procedure: CARDIOVERSION;  Surgeon: Sueanne Margarita, MD;  Location: [facility];  Service: Cardiovascular;  Laterality: N/A;  . CERVICAL ABLATION  ~ 2012  . CESAREAN SECTION  1980  . CESAREAN SECTION WITH BILATERAL  TUBAL LIGATION  1982  . ELECTROPHYSIOLOGIC STUDY N/A 01/13/2016   Procedure: Atrial Fibrillation Ablation;  Surgeon: Cortland Crehan Meredith Leeds, MD;  Location: [facility];  Service: Cardiovascular;  Laterality: N/A;  . MELANOMA EXCISION Right 10/1975   outer thigh "malignant"  . REFRACTIVE SURGERY Bilateral 1990s  . TEE WITHOUT CARDIOVERSION N/A 10/25/2015   Procedure: TRANSESOPHAGEAL ECHOCARDIOGRAM (TEE);  Surgeon: Sueanne Margarita, MD;  Location: [facility] ENDOSCOPY;  Service: Cardiovascular;  Laterality: N/A;  . TEE WITHOUT CARDIOVERSION N/A 01/11/2016   Procedure: TRANSESOPHAGEAL ECHOCARDIOGRAM (TEE);  Surgeon: Josue Hector, MD;  Location[facility] END[facility]COPY;  Service: Cardiovascular;  Laterality: N/A;  . TONSILLECTOMY  1967     Current Outpatient Prescriptions  Medication Sig Dispense Refill  . augmented betamethasone dipropionate (DIPROLENE-AF) 0.05 % cream Apply 1 application topically daily as needed (Psoriasis).    . Calcium 600-400 MG-UNIT CHEW Chew 1 each by mouth 2 (two) times daily.     . carvedilol (COREG) 6.25 MG tablet Take 6.25 mg by mouth 2 (two) times daily with a meal.     . Cholecalciferol (VITAMIN D PO) Take 10,000 Units by mouth daily.     . Coenzyme Q10 (CO Q10) 100 MG CAPS Take 100 mg by mouth daily.     Marland Kitchen ELIQUIS 5 MG TABS tablet TAKE 1 TABLET (5 MG TOTAL) BY MOUTH 2 (TWO) TIMES DAILY. 60 tablet 5  . furosemide (LASIX) 20 MG tablet Take 1 tablet (20 mg total) by mouth as  needed (take 1 tablet daily as needed for 3-5lb weight gain, increased swelling). 30 tablet 2  . levothyroxine (SYNTHROID, LEVOTHROID) 50 MCG tablet TAKE 1 TABLET (50 MCG TOTAL) BY MOUTH DAILY. 90 tablet 1  . lisinopril (PRINIVIL,ZESTRIL) 5 MG tablet TAKE 1 TABLET (5 MG TOTAL) BY MOUTH DAILY. 90 tablet 1  . loratadine (CLARITIN) 10 MG tablet TAKE 1 TABLET BY MOUTH DAILY AS NEEDED FOR ALLERGY RELIEF  0  . Magnesium 250 MG TABS Take 500 mg by mouth every evening.     . montelukast (SINGULAIR) 10 MG tablet TAKE 1  TABLET BY MOUTH EVERY DAY 90 tablet 1  . Multiple Vitamin (MULTIVITAMIN WITH MINERALS) TABS tablet Take 1 tablet by mouth daily.    . NONFORMULARY OR COMPOUNDED ITEM Place 1 each vaginally See admin instructions. Estriol/testosterone 0.25-0.25 vaginal suppository compound. Insert vaginally on Monday Tuesday Thursday Friday    . NONFORMULARY OR COMPOUNDED ITEM Place 1 application onto the skin See admin instructions. Biest 8/2 HRT compound  Apply topically every day except Saturday.    . NONFORMULARY OR COMPOUNDED ITEM Apply 1 each topically See admin instructions. Progesterone 8%/18ml Apply 0.1mls topically every day except Saturday.    Vladimir Faster Glycol-Propyl Glycol (SYSTANE OP) Place 1 drop into both eyes daily.    . Probiotic Product (PROBIOTIC DAILY PO) Take 1 tablet by mouth daily.    . traMADol (ULTRAM) 50 MG tablet Take 1 tablet qid PRN for pain. 30 tablet 0  . Turmeric 500 MG CAPS Take 500 mg by mouth 2 (two) times daily.     No current facility-administered medications for this visit.     Allergies:   Lipitor [atorvastatin]; Metoprolol; Rythmol [propafenone]; and Sulfa drugs cross reactors   Social History:  The patient  reports that she has never smoked. She has never used smokeless tobacco. She reports that she drinks about 2.4 oz of alcohol per week . She reports that she does not use drugs.   Family History:  The patient's family history includes Diabetes in her father; Heart attack in her brother and mother; Heart disease in her father and mother; Hypertension in her father and mother.    ROS:  Please see the history of present illness.   Otherwise, review of systems is positive for palpitations.   All other systems are reviewed and negative.   PHYSICAL EXAM: VS:  BP 112/72   Pulse (!) 103   Ht 5\' 6"  (1.676 m)   Wt 233 lb 6.4 oz (105.9 kg)   LMP 10/29/2013 (Approximate)   SpO2 96%   BMI 37.67 kg/m  , BMI Body mass index is 37.67 kg/m. GEN: Well nourished, well  developed, in no acute distress  HEENT: normal  Neck: no JVD, carotid bruits, or masses Cardiac: tachycardic; no murmurs, rubs, or gallops,no edema  Respiratory:  clear to auscultation bilaterally, normal work of breathing GI: soft, nontender, nondistended, + BS MS: no deformity or atrophy  Skin: warm and dry Neuro:  Strength and sensation are intact Psych: euthymic mood, full affect  EKG:  EKG is not ordered today. Personal review of the ekg ordered 12/05/16 shows atrial flutter    Recent Labs: 08/16/2016: ALT 13; Magnesium 2.4; TSH 2.11 12/05/2016: BUN 19; Creatinine, Ser 1.06; Hemoglobin 13.8; Platelets 331; Potassium 4.9; Sodium 140    Lipid Panel     Component Value Date/Time   CHOL 218 (H) 08/16/2016 1647   TRIG 230 (H) 08/16/2016 1647   HDL 38 (L) 08/16/2016 1647  CHOLHDL 5.7 (H) 08/16/2016 1647   VLDL 46 (H) 08/16/2016 1647   LDLCALC 134 (H) 08/16/2016 1647     Wt Readings from Last 3 Encounters:  02/04/17 233 lb 6.4 oz (105.9 kg)  12/26/16 229 lb 12.8 oz (104.2 kg)  12/05/16 228 lb (103.4 kg)      Other studies Reviewed: Additional studies/ records that were reviewed today include: TTE 12/19/16 Review of the above records today demonstrates:  - Left ventricle: The cavity size was normal. Wall thickness was   normal. Systolic function was normal. The estimated ejection   fraction was in the range of 55% to 60%. Wall motion was normal;   there were no regional wall motion abnormalities. Features are   consistent with a pseudonormal left ventricular filling pattern,   with concomitant abnormal relaxation and increased filling   pressure (grade 2 diastolic dysfunction). - Aortic valve: There was no stenosis. - Mitral valve: There was mild regurgitation. - Left atrium: The atrium was mildly dilated. - Right ventricle: The cavity size was normal. Systolic function   was normal. - Right atrium: The atrium was mildly dilated. - Tricuspid valve: Peak RV-RA gradient  (S): 32 mm Hg. - Pulmonary arteries: PA peak pressure: 35 mm Hg (S). - Inferior vena cava: The vessel was normal in size. The   respirophasic diameter changes were in the normal range (>= 50%),   consistent with normal central venous pressure.   ASSESSMENT AND PLAN:  1.  Persistent atrial fibrillation: On Eliquis Ablation 01/13/16. No other recurrences. We'll plan to stop Multaq today.  This patients CHA2DS2-VASc Score and unadjusted Ischemic Stroke Rate (% per year) is equal to 3.2 % stroke rate/year from a score of 3  Above score calculated as 1 point each if present [CHF, HTN, DM, Vascular=MI/PAD/Aortic Plaque, Age if 65-74, or Female] Above score calculated as 2 points each if present [Age > 75, or Stroke/TIA/TE]  2. Hypertension: Well-controlled today. No changes.  3. Mild OSA: Continues to try conservative measures for therapy. No changes.  4. Atrial flutter, typical: Plan for ablation 02/08/17. Risks and benefits discussed. Risks include bleeding, tamponade, heart block, and stroke, among others. She understands these risks and has agreed to the procedure.  Current medicines are reviewed at length with the patient today.   The patient does not have concerns regarding her medicines.  The following changes were made today:  Stop Multaq  Labs/ tests ordered today include:  Orders Placed This Encounter  Procedures  . Basic Metabolic Panel (BMET)  . CBC w/Diff     Disposition:   FU with Maciej Schweitzer 1 months  Signed, Lacresia Darwish Meredith Leeds, MD  02/04/2017 1:50 PM     [address] [place] [address] [phone] (office) [phone] (fax)

## 2017-02-04 ENCOUNTER — Ambulatory Visit (INDEPENDENT_AMBULATORY_CARE_PROVIDER_SITE_OTHER): Payer: 59 | Admitting: Cardiology

## 2017-02-04 ENCOUNTER — Encounter: Payer: Self-pay | Admitting: Cardiology

## 2017-02-04 VITALS — BP 112/72 | HR 103 | Ht 66.0 in | Wt 233.4 lb

## 2017-02-04 DIAGNOSIS — I10 Essential (primary) hypertension: Secondary | ICD-10-CM

## 2017-02-04 DIAGNOSIS — I481 Persistent atrial fibrillation: Secondary | ICD-10-CM

## 2017-02-04 DIAGNOSIS — I4819 Other persistent atrial fibrillation: Secondary | ICD-10-CM

## 2017-02-04 DIAGNOSIS — G4733 Obstructive sleep apnea (adult) (pediatric): Secondary | ICD-10-CM

## 2017-02-04 DIAGNOSIS — I483 Typical atrial flutter: Secondary | ICD-10-CM

## 2017-02-04 DIAGNOSIS — Z01812 Encounter for preprocedural laboratory examination: Secondary | ICD-10-CM

## 2017-02-04 NOTE — Patient Instructions (Addendum)
Medication Instructions:  Your physician has recommended you make the following change in your medication: 1. STOP Multaq  Labwork: Pre procedure labs today: BMET & CBC w/ diff  Testing/Procedures: None ordered  Follow-Up: Your physician recommends that you schedule a follow-up appointment in: 4 weeks, after your procedure on 02/08/2017, with Dr. Curt Bears.  -- If you need a refill on your cardiac medications before your next appointment, please call your pharmacy. --  Thank you for choosing [facility]!!   Trinidad Curet, RN [phone]

## 2017-02-05 LAB — CBC WITH DIFFERENTIAL/PLATELET
Basophils Absolute: 0.1 10*3/uL (ref 0.0–0.2)
Basos: 0 %
EOS (ABSOLUTE): 0.3 10*3/uL (ref 0.0–0.4)
Eos: 3 %
Hematocrit: 41.9 % (ref 34.0–46.6)
Hemoglobin: 13.9 g/dL (ref 11.1–15.9)
Immature Grans (Abs): 0 10*3/uL (ref 0.0–0.1)
Immature Granulocytes: 0 %
Lymphocytes Absolute: 4.2 10*3/uL — ABNORMAL HIGH (ref 0.7–3.1)
Lymphs: 34 %
MCH: 28.8 pg (ref 26.6–33.0)
MCHC: 33.2 g/dL (ref 31.5–35.7)
MCV: 87 fL (ref 79–97)
Monocytes Absolute: 0.8 10*3/uL (ref 0.1–0.9)
Monocytes: 7 %
Neutrophils Absolute: 6.9 10*3/uL (ref 1.4–7.0)
Neutrophils: 56 %
Platelets: 357 10*3/uL (ref 150–379)
RBC: 4.82 x10E6/uL (ref 3.77–5.28)
RDW: 13.8 % (ref 12.3–15.4)
WBC: 12.4 10*3/uL — ABNORMAL HIGH (ref 3.4–10.8)

## 2017-02-05 LAB — BASIC METABOLIC PANEL
BUN/Creatinine Ratio: 19 (ref 9–23)
BUN: 17 mg/dL (ref 6–24)
CO2: 22 mmol/L (ref 20–29)
Calcium: 9.6 mg/dL (ref 8.7–10.2)
Chloride: 100 mmol/L (ref 96–106)
Creatinine, Ser: 0.9 mg/dL (ref 0.57–1.00)
GFR calc Af Amer: 82 mL/min/{1.73_m2} (ref >59)
GFR calc non Af Amer: 71 mL/min/{1.73_m2} (ref >59)
Glucose: 97 mg/dL (ref 65–99)
Potassium: 4.4 mmol/L (ref 3.5–5.2)
Sodium: 141 mmol/L (ref 134–144)

## 2017-02-08 ENCOUNTER — Ambulatory Visit (HOSPITAL_COMMUNITY): Payer: 59 | Admitting: Certified Registered Nurse Anesthetist

## 2017-02-08 ENCOUNTER — Encounter (HOSPITAL_COMMUNITY): Payer: Self-pay | Admitting: Certified Registered Nurse Anesthetist

## 2017-02-08 ENCOUNTER — Encounter (HOSPITAL_COMMUNITY)
Admission: RE | Disposition: A | Discharge status: Home / Self Care | Payer: Self-pay | Source: Ambulatory Visit | Attending: Cardiology

## 2017-02-08 ENCOUNTER — Other Ambulatory Visit: Payer: Self-pay | Admitting: Physician Assistant

## 2017-02-08 ENCOUNTER — Ambulatory Visit (HOSPITAL_COMMUNITY)
Admission: RE | Admit: 2017-02-08 | Discharge: 2017-02-08 | Disposition: A | Discharge status: Home / Self Care | Payer: 59 | Source: Ambulatory Visit | Attending: Cardiology | Admitting: Cardiology

## 2017-02-08 DIAGNOSIS — E039 Hypothyroidism, unspecified: Secondary | ICD-10-CM | POA: Diagnosis not present

## 2017-02-08 DIAGNOSIS — J45909 Unspecified asthma, uncomplicated: Secondary | ICD-10-CM | POA: Insufficient documentation

## 2017-02-08 DIAGNOSIS — I4892 Unspecified atrial flutter: Secondary | ICD-10-CM | POA: Insufficient documentation

## 2017-02-08 DIAGNOSIS — I483 Typical atrial flutter: Secondary | ICD-10-CM | POA: Diagnosis not present

## 2017-02-08 DIAGNOSIS — I471 Supraventricular tachycardia: Secondary | ICD-10-CM | POA: Insufficient documentation

## 2017-02-08 HISTORY — PX: A-FLUTTER ABLATION: EP1230

## 2017-02-08 SURGERY — A-FLUTTER ABLATION
Anesthesia: Monitor Anesthesia Care

## 2017-02-08 MED ORDER — PHENYLEPHRINE HCL 10 MG/ML IJ SOLN
INTRAMUSCULAR | Status: DC | PRN
  Administered 2017-02-08: 40 ug via INTRAVENOUS

## 2017-02-08 MED ORDER — BUPIVACAINE HCL (PF) 0.25 % IJ SOLN
INTRAMUSCULAR | Status: DC | PRN
  Administered 2017-02-08: 30 mL

## 2017-02-08 MED ORDER — PROPOFOL 500 MG/50ML IV EMUL
INTRAVENOUS | Status: DC | PRN
  Administered 2017-02-08: 100 ug/kg/min via INTRAVENOUS

## 2017-02-08 MED ORDER — ACETAMINOPHEN 325 MG PO TABS
650.0000 mg | ORAL_TABLET | ORAL | Status: DC | PRN
  Filled 2017-02-08: qty 2

## 2017-02-08 MED ORDER — HEPARIN (PORCINE) IN NACL 2-0.9 UNIT/ML-% IJ SOLN
INTRAMUSCULAR | Status: AC
  Filled 2017-02-08: qty 500

## 2017-02-08 MED ORDER — SODIUM CHLORIDE 0.9% FLUSH: 3.0000 mL | Freq: Two times a day (BID) | INTRAVENOUS | Status: DC

## 2017-02-08 MED ORDER — LIDOCAINE 2% (20 MG/ML) 5 ML SYRINGE
INTRAMUSCULAR | Status: DC | PRN
  Administered 2017-02-08: 40 mg via INTRAVENOUS

## 2017-02-08 MED ORDER — SODIUM CHLORIDE 0.9 % IV SOLN
INTRAVENOUS | Status: DC | PRN
  Administered 2017-02-08: 10:00:00 via INTRAVENOUS

## 2017-02-08 MED ORDER — BUPIVACAINE HCL (PF) 0.25 % IJ SOLN
INTRAMUSCULAR | Status: AC
  Filled 2017-02-08: qty 30

## 2017-02-08 MED ORDER — ONDANSETRON HCL 4 MG/2ML IJ SOLN
INTRAMUSCULAR | Status: DC | PRN
  Administered 2017-02-08: 4 mg via INTRAVENOUS

## 2017-02-08 MED ORDER — SODIUM CHLORIDE 0.9 % IV SOLN: 250.0000 mL | INTRAVENOUS | Status: DC | PRN

## 2017-02-08 MED ORDER — SODIUM CHLORIDE 0.9% FLUSH: 3.0000 mL | INTRAVENOUS | Status: DC | PRN

## 2017-02-08 MED ORDER — HEPARIN (PORCINE) IN NACL 2-0.9 UNIT/ML-% IJ SOLN
INTRAMUSCULAR | Status: AC | PRN
  Administered 2017-02-08: 1000 mL

## 2017-02-08 MED ORDER — FENTANYL CITRATE (PF) 100 MCG/2ML IJ SOLN
INTRAMUSCULAR | Status: DC | PRN
  Administered 2017-02-08 (×4): 25 ug via INTRAVENOUS

## 2017-02-08 MED ORDER — FLECAINIDE ACETATE 100 MG PO TABS: 100.0000 mg | ORAL_TABLET | Freq: Two times a day (BID) | ORAL | 6 refills | Status: DC

## 2017-02-08 MED ORDER — ONDANSETRON HCL 4 MG/2ML IJ SOLN: 4.0000 mg | Freq: Four times a day (QID) | INTRAMUSCULAR | Status: DC | PRN

## 2017-02-08 MED ORDER — MIDAZOLAM HCL 5 MG/5ML IJ SOLN
INTRAMUSCULAR | Status: DC | PRN
  Administered 2017-02-08: 2 mg via INTRAVENOUS

## 2017-02-08 SURGICAL SUPPLY — 12 items
BLANKET WARM UNDERBOD FULL ACC (MISCELLANEOUS) ×2 IMPLANT
CATH DUODECA HALO/ISMUS 7FR (CATHETERS) ×2 IMPLANT
CATH EZ STEER NAV 8MM D-F CUR (ABLATOR) ×2 IMPLANT
CATH JOSEPHSON QUAD-ALLRED 6FR (CATHETERS) ×2 IMPLANT
CATH WEBSTER BI DIR CS D-F CRV (CATHETERS) ×2 IMPLANT
PACK EP LATEX FREE (CUSTOM PROCEDURE TRAY) ×1
PACK EP LF (CUSTOM PROCEDURE TRAY) ×1 IMPLANT
PAD DEFIB LIFELINK (PAD) ×2 IMPLANT
PATCH CARTO3 (PAD) ×2 IMPLANT
SHEATH PINNACLE 6F 10CM (SHEATH) ×2 IMPLANT
SHEATH PINNACLE 7F 10CM (SHEATH) ×2 IMPLANT
SHEATH PINNACLE 8F 10CM (SHEATH) ×2 IMPLANT

## 2017-02-08 NOTE — Discharge Instructions (Signed)
Femoral Site Care Refer to this sheet in the next few weeks. These instructions provide you with information about caring for yourself after your procedure. Your health care provider may also give you more specific instructions. Your treatment has been planned according to current medical practices, but problems sometimes occur. Call your health care provider if you have any problems or questions after your procedure. What can I expect after the procedure? After your procedure, it is typical to have the following:  Bruising at the site that usually fades within 1-2 weeks.  Blood collecting in the tissue (hematoma) that may be painful to the touch. It should usually decrease in size and tenderness within 1-2 weeks.  Follow these instructions at home:  Take medicines only as directed by your health care provider.  You may shower 24-48 hours after the procedure or as directed by your health care provider. Remove the bandage (dressing) and gently wash the site with plain soap and water. Pat the area dry with a clean towel. Do not rub the site, because this may cause bleeding.  Do not take baths, swim, or use a hot tub until your health care provider approves.  Check your insertion site every day for redness, swelling, or drainage.  Do not apply powder or lotion to the site.  Limit use of stairs to twice a day for the first 2-3 days or as directed by your health care provider.  Do not squat for the first 2-3 days or as directed by your health care provider.  Do not lift over 10 lb (4.5 kg) for 5 days after your procedure or as directed by your health care provider.  Ask your health care provider when it is okay to: ? Return to work or school. ? Resume usual physical activities or sports. ? Resume sexual activity.  Do not drive home if you are discharged the same day as the procedure. Have someone else drive you.  You may drive 24 hours after the procedure unless otherwise instructed by  your health care provider.  Do not operate machinery or power tools for 24 hours after the procedure or as directed by your health care provider.  If your procedure was done as an outpatient procedure, which means that you went home the same day as your procedure, a responsible adult should be with you for the first 24 hours after you arrive home.  Keep all follow-up visits as directed by your health care provider. This is important. Contact a health care provider if:  You have a fever.  You have chills.  You have increased bleeding from the site. Hold pressure on the site. Get help right away if:  You have unusual pain at the site.  You have redness, warmth, or swelling at the site.  You have drainage (other than a small amount of blood on the dressing) from the site.  The site is bleeding, and the bleeding does not stop after 30 minutes of holding steady pressure on the site.  Your leg or foot becomes pale, cool, tingly, or numb. This information is not intended to replace advice given to you by your health care provider. Make sure you discuss any questions you have with your health care provider. Document Released: 12/04/2013 Document Revised: 09/08/2015 Document Reviewed: 10/20/2013 Elsevier Interactive Patient Education  2018 [employer]. No driving for 4 days. No lifting over 5 lbs for 1 week. No vigorous or sexual activity for 1 week. You may return to work on 02/15/17.  Keep procedure site clean & dry. If you notice increased pain, swelling, bleeding or pus, call/return!  You may shower, but no soaking baths/hot tubs/pools for 1 week.

## 2017-02-08 NOTE — Anesthesia Preprocedure Evaluation (Signed)
Anesthesia Evaluation  Patient identified by MRN, date of birth, ID band Patient awake    Reviewed: Allergy & Precautions, NPO status , Patient's Chart, lab work & pertinent test results  History of Anesthesia Complications Negative for: history of anesthetic complications  Airway Mallampati: II  TM Distance: >3 FB Neck ROM: Full    Dental no notable dental hx. (+) Dental Advisory Given   Pulmonary asthma , pneumonia,    breath sounds clear to auscultation       Cardiovascular + Valvular Problems/Murmurs  Rhythm:Irregular Rate:Normal  Impressions:  - Normal LV size with EF 55-60%. Moderate diastolic dysfunction.   Normal RV size and systolic function. Mild mitral regurgitation.   Biatrial enlargement.   Neuro/Psych negative neurological ROS  negative psych ROS   GI/Hepatic   Endo/Other  Hypothyroidism   Renal/GU      Musculoskeletal   Abdominal   Peds  Hematology   Anesthesia Other Findings   Reproductive/Obstetrics                             Anesthesia Physical  Anesthesia Plan  ASA: III  Anesthesia Plan: MAC   Post-op Pain Management:    Induction: Intravenous  PONV Risk Score and Plan:   Airway Management Planned: Simple Face Mask  Additional Equipment:   Intra-op Plan:   Post-operative Plan:   Informed Consent:   Dental advisory given  Plan Discussed with: Anesthesiologist and CRNA  Anesthesia Plan Comments:         Anesthesia Quick Evaluation

## 2017-02-08 NOTE — H&P (Signed)
Monica Morgan is a 56 y.o. female with a history of atrial fibrllation s/p ablation. She presetned to clinic in atrial flutter. Flutter appears to be typical. She thus presents for ablatio of atrial flutter. On exam, iRRR, no murmurs, lungs clear. Risks and benefits discussed. Risks include bleeding, tamponade, heart block and stroke. She understands the risks and has agreed to the procedure.  Doran Nestle Curt Bears, MD 02/08/2017 9:20 AM

## 2017-02-08 NOTE — Progress Notes (Signed)
Patient is seen post procedure by Dr. Curt Bears. No CP or SOB, no palpitations, patient feels well without site pain VSS, SR on telemetry R groin is dry, non-tender, no bleeding Activity instructions and site care were reviewed with the patient. Starting Flecainide 100mg  BID, (patient is on coreg) Stress test is arranged to follow Routine EP follow up in place  Tommye Standard, PA-C  Allegra Lai, MD

## 2017-02-08 NOTE — Anesthesia Postprocedure Evaluation (Signed)
Anesthesia Post Note  Patient: Monica Morgan  Procedure(s) Performed: A-Flutter Ablation (N/A )     Patient location during evaluation: Cath Lab Anesthesia Type: MAC Level of consciousness: awake Pain management: pain level controlled Vital Signs Assessment: post-procedure vital signs reviewed and stable Respiratory status: spontaneous breathing Cardiovascular status: stable Anesthetic complications: no    Last Vitals:  Vitals:   02/08/17 1545 02/08/17 1600  BP: (!) 137/49 (!) 130/48  Pulse: 68 69  Resp: 15 19  Temp:    SpO2: 97% 97%    Last Pain:  Vitals:   02/08/17 1308  TempSrc: Oral  PainSc:                  Monica Morgan

## 2017-02-08 NOTE — Transfer of Care (Signed)
Immediate Anesthesia Transfer of Care Note  Patient: Monica Morgan  Procedure(s) Performed: A-Flutter Ablation (N/A )  Patient Location: PACU  Anesthesia Type:MAC  Level of Consciousness: patient cooperative and responds to stimulation  Airway & Oxygen Therapy: Patient Spontanous Breathing  Post-op Assessment: Report given to RN and Post -op Vital signs reviewed and stable  Post vital signs: Reviewed and stable  Last Vitals:  Vitals:   02/08/17 0731 02/08/17 1203  BP: 127/86   Pulse: 70   Temp: 36.9 C (!) 36.3 C  SpO2: 99%     Last Pain:  Vitals:   02/08/17 0731  TempSrc: Oral      Patients Stated Pain Goal: 5 (62/13/08 6578)  Complications: No apparent anesthesia complications

## 2017-02-11 ENCOUNTER — Encounter (HOSPITAL_COMMUNITY): Payer: Self-pay | Admitting: Cardiology

## 2017-02-12 ENCOUNTER — Telehealth: Payer: Self-pay | Admitting: Cardiology

## 2017-02-12 ENCOUNTER — Encounter: Payer: Self-pay | Admitting: *Deleted

## 2017-02-12 NOTE — Telephone Encounter (Signed)
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 Curt Bears.  Letter stating this faxed to [phone]  She also reports "the new medicine that I am on is not working. I am having AFib".   Says that is started Sunday morning and lasted until this morning, almost non-stop.  Reports HR never got higher than 100, but she was very tired/fatigued after being out of rhythm for several days. BP today 123/76, HR 69  (after she was in rhythm)   She also inquires about how long she will have to wait for "ablation on the left side"?  She would like to have before the end of the year.  She understands I will discuss with him tomorrow and let her know his recommendation/s.

## 2017-02-12 NOTE — Telephone Encounter (Signed)
Pt would like a call back has questions about her procedure.   Pt would also like a note to go back to work on [employer]. Oct.  31st please.

## 2017-02-12 NOTE — Telephone Encounter (Signed)
Will forward to Dr. Curt Bears for advisement on return to work.

## 2017-02-13 NOTE — Telephone Encounter (Signed)
Informed patient to continue to monitor AFib/Flutter issues.  Advised that Dr. Curt Bears does not want to make any med changes at this time. Advised to call the office back if she begins to experience issues or stays out of rhythm for more than 24 hours. Informed that we can schedule her next ablation at the end of the year.  She agreed to going over those instructions when she comes in for her follow up appt on 11/26. Patient verbalized understanding and agreeable to plan.

## 2017-02-21 ENCOUNTER — Ambulatory Visit: Payer: Self-pay | Admitting: Internal Medicine

## 2017-02-26 ENCOUNTER — Ambulatory Visit (INDEPENDENT_AMBULATORY_CARE_PROVIDER_SITE_OTHER): Payer: 59

## 2017-02-26 DIAGNOSIS — I471 Supraventricular tachycardia: Secondary | ICD-10-CM

## 2017-02-26 LAB — EXERCISE TOLERANCE TEST
Estimated workload: 5.8 METS
Exercise duration (min): 4 min
Exercise duration (sec): 3 s
MPHR: 162 {beats}/min
Peak HR: 106 {beats}/min
Percent HR: 65 %
RPE: 17
Rest HR: 63 {beats}/min

## 2017-03-04 ENCOUNTER — Encounter: Payer: Self-pay | Admitting: Physician Assistant

## 2017-03-04 ENCOUNTER — Ambulatory Visit (INDEPENDENT_AMBULATORY_CARE_PROVIDER_SITE_OTHER): Payer: 59 | Admitting: Physician Assistant

## 2017-03-04 VITALS — BP 128/80 | HR 100 | Temp 97.3°F | Resp 16 | Ht 66.0 in | Wt 228.6 lb

## 2017-03-04 DIAGNOSIS — I481 Persistent atrial fibrillation: Secondary | ICD-10-CM | POA: Diagnosis not present

## 2017-03-04 DIAGNOSIS — R059 Cough, unspecified: Secondary | ICD-10-CM

## 2017-03-04 DIAGNOSIS — R05 Cough: Secondary | ICD-10-CM | POA: Diagnosis not present

## 2017-03-04 DIAGNOSIS — I4819 Other persistent atrial fibrillation: Secondary | ICD-10-CM

## 2017-03-04 MED ORDER — DOXYCYCLINE HYCLATE 100 MG PO CAPS: ORAL_CAPSULE | ORAL | 0 refills | Status: DC

## 2017-03-04 MED ORDER — ALBUTEROL SULFATE HFA 108 (90 BASE) MCG/ACT IN AERS: 2.0000 | INHALATION_SPRAY | RESPIRATORY_TRACT | 0 refills | Status: DC | PRN

## 2017-03-04 MED ORDER — BENZONATATE 100 MG PO CAPS: 100.0000 mg | ORAL_CAPSULE | Freq: Three times a day (TID) | ORAL | 0 refills | Status: DC | PRN

## 2017-03-04 NOTE — Progress Notes (Signed)
Subjective:    Patient ID: Monica Morgan, female    DOB: 25-Jun-1960, 56 y.o.   MRN: [id number]  HPI 55 y.o. morbidly obese WF with history of Afib on eliquis, HTN on ACE presents with cough x 2 weeks. Was getting better but then it got worse. Has been using dylsum, singulair, cough drops, she is not claritin. No fever, chills. Some mucus with cough, clear, some rhinorrhea. No SOB, some wheezing, no CP.   Blood pressure 128/80, pulse 100, temperature (!) 97.3 F (36.3 C), resp. rate 16, height 5\' 6"  (1.676 m), weight 228 lb 9.6 oz (103.7 kg), last menstrual period 10/29/2013, SpO2 98 %.  BMI Readings from Last 1 Encounters:  03/04/17 36.90 kg/m    Medications Current Outpatient Medications on File Prior to Visit  Medication Sig  . augmented betamethasone dipropionate (DIPROLENE-AF) 0.05 % cream Apply 1 application topically daily as needed (for psoriasis).   . Calcium 600-400 MG-UNIT CHEW Chew 1 each by mouth 2 (two) times daily.   . carvedilol (COREG) 6.25 MG tablet Take 6.25 mg by mouth 2 (two) times daily with a meal.   . Cholecalciferol (VITAMIN D PO) Take 10,000 Units by mouth daily.   . Coenzyme Q10 (CO Q10) 100 MG CAPS Take 100 mg by mouth daily.   Marland Kitchen ELIQUIS 5 MG TABS tablet TAKE 1 TABLET (5 MG TOTAL) BY MOUTH 2 (TWO) TIMES DAILY.  . flecainide (TAMBOCOR) 100 MG tablet Take 1 tablet (100 mg total) by mouth 2 (two) times daily.  . furosemide (LASIX) 20 MG tablet Take 1 tablet (20 mg total) by mouth as needed (take 1 tablet daily as needed for 3-5lb weight gain, increased swelling).  Marland Kitchen levothyroxine (SYNTHROID, LEVOTHROID) 50 MCG tablet TAKE 1 TABLET (50 MCG TOTAL) BY MOUTH DAILY.  Marland Kitchen lisinopril (PRINIVIL,ZESTRIL) 5 MG tablet TAKE 1 TABLET (5 MG TOTAL) BY MOUTH DAILY.  Marland Kitchen loratadine (CLARITIN) 10 MG tablet TAKE 10 MG BY MOUTH DAILY AS NEEDED FOR ALLERGY RELIEF  . Magnesium 500 MG TABS Take 500 mg by mouth every evening.   . montelukast (SINGULAIR) 10 MG tablet TAKE 1 TABLET BY  MOUTH EVERY DAY (Patient taking differently: TAKE 10 MG BY MOUTH EVERY DAY)  . Multiple Vitamin (MULTIVITAMIN WITH MINERALS) TABS tablet Take 1 tablet by mouth daily.  . NONFORMULARY OR COMPOUNDED ITEM Place 1 each vaginally See admin instructions. Estriol/testosterone 0.25-0.25 vaginal suppository compound. Insert vaginally on Sunday, Monday and Wednesday  . NONFORMULARY OR COMPOUNDED ITEM Place 1 application onto the skin See admin instructions. Biest 8/2 HRT compound  Apply topically every day except Saturday.  . NONFORMULARY OR COMPOUNDED ITEM Apply 1 each topically See admin instructions. Progesterone 8%/13ml Apply 0.40mls topically every day except Saturday.  Vladimir Faster Glycol-Propyl Glycol (SYSTANE OP) Place 1 drop into both eyes daily.  . Probiotic Product (PROBIOTIC DAILY PO) Take 1 tablet by mouth daily.  . traMADol (ULTRAM) 50 MG tablet Take 1 tablet qid PRN for pain. (Patient taking differently: Take 50 mg by mouth 4 (four) times daily as needed for moderate pain. )  . Turmeric 500 MG CAPS Take 500 mg by mouth 2 (two) times daily.   No current facility-administered medications on file prior to visit.     Problem list She has Mixed hyperlipidemia; AF (paroxysmal atrial fibrillation) ([place]); Vitamin D deficiency; Medication management; Hypothyroidism; Abnormal glucose; Obesity; Atrial flutter with rapid ventricular response ([place]); Leukocytosis; Persistent atrial fibrillation ([employer]); and AF (atrial fibrillation) ([place]) on their problem list.  Review  of Systems  Constitutional: Positive for fatigue. Negative for chills and fever.  HENT: Positive for rhinorrhea. Negative for congestion, dental problem, ear discharge, ear pain, nosebleeds, sinus pressure, sore throat, trouble swallowing and voice change.   Respiratory: Positive for cough and wheezing. Negative for chest tightness and shortness of breath.   Cardiovascular: Negative.   Gastrointestinal: Negative.   Genitourinary: Negative.    Musculoskeletal: Negative.   Neurological: Negative.        Objective:   Physical Exam  Constitutional: She appears well-developed and well-nourished.  obese  HENT:  Head: Normocephalic and atraumatic.  Right Ear: External ear normal.  Nose: Right sinus exhibits maxillary sinus tenderness. Right sinus exhibits no frontal sinus tenderness. Left sinus exhibits maxillary sinus tenderness. Left sinus exhibits no frontal sinus tenderness.  Eyes: Conjunctivae and EOM are normal.  Neck: Normal range of motion. Neck supple.  Cardiovascular: Normal rate, normal heart sounds and intact distal pulses. An irregularly irregular rhythm present.  Pulmonary/Chest: Effort normal and breath sounds normal. No respiratory distress. She has no wheezes.  Abdominal: Soft. Bowel sounds are normal.  Lymphadenopathy:    She has no cervical adenopathy.  Skin: Skin is warm and dry.       Assessment & Plan:    Persistent atrial fibrillation (HCC) Follow up cardiology  COUGH LUNGS CTAB Will hold the ABX and take if she is not getting better, increase fluids, rest, cont allergy pill -     benzonatate (TESSALON PERLES) 100 MG capsule; Take 1 capsule (100 mg total) 3 (three) times daily as needed by mouth for cough (Max: 600mg  per day). -     albuterol (VENTOLIN HFA) 108 (90 Base) MCG/ACT inhaler; Inhale 2 puffs every 4 (four) hours as needed into the lungs for wheezing or shortness of breath. -     doxycycline (VIBRAMYCIN) 100 MG capsule; Take 1 capsule twice daily with food     Future Appointments  Date Time Provider [place]  03/20/2017  8:30 AM Constance Haw, MD [facility]  05/14/2017  2:45 PM Unk Pinto, MD [facility] None

## 2017-03-04 NOTE — Patient Instructions (Signed)
Get on claritin  Tessalon as needed Hold onto zpak, take if not better Can do albuterol AS needed for wheezing and shortness of breath  Breo once daily  Can do steroid inhaler, NEED TO DO DAILY, this is NOT a rescue inhaler so if you are acutely short of breath please use your albuterol or call 911.  Do 1 puff once a day.  Do before you brush your teeth OR wash your mouth afterwards.  IF YOU DO NOT [place] YOUR MOUTH OUT IT CAN CAUSE YEAST Can do 2 tsp vinegar with water and switch to help prevent yeast or help yeast in your mouth.   Go to the ER if any chest pain, shortness of breath, nausea, dizziness, severe HA, changes vision/speech  HOW TO TREAT VIRAL COUGH AND COLD SYMPTOMS:  -Symptoms usually last at least 1 week with the worst symptoms being around day 4.  - colds usually start with a sore throat and end with a cough, and the cough can take 2 weeks to get better.  -No antibiotics are needed for colds, flu, sore throats, cough, bronchitis UNLESS symptoms are longer than 7 days OR if you are getting better then get drastically worse.  -There are a lot of combination medications (Dayquil, Nyquil, Vicks 44, tyelnol cold and sinus, ETC). Please look at the ingredients on the back so that you are treating the correct symptoms and not doubling up on medications/ingredients.    Medicines you can use  Nasal congestion  - pseudoephedrine (Sudafed)- behind the counter, do not use if you have high blood pressure, medicine that have -D in them.  - phenylephrine (Sudafed PE) -Dextormethorphan + chlorpheniramine (Coridcidin HBP)- okay if you have high blood pressure -Oxymetazoline (Afrin) nasal spray- LIMIT to 3 days -Saline nasal spray -Neti pot (used distilled or bottled water)  Ear pain/congestion  -pseudoephedrine (sudafed) - Nasonex/flonase nasal spray  Fever  -Acetaminophen (Tyelnol) -Ibuprofen (Advil, motrin, aleve)  Sore Throat  -Acetaminophen (Tyelnol) -Ibuprofen (Advil,  motrin, aleve) -Drink a lot of water -Gargle with salt water - Rest your voice (don't talk) -Throat sprays -Cough drops  Body Aches  -Acetaminophen (Tyelnol) -Ibuprofen (Advil, motrin, aleve)  Headache  -Acetaminophen (Tyelnol) -Ibuprofen (Advil, motrin, aleve) - Exedrin, Exedrin Migraine  Allergy symptoms (cough, sneeze, runny nose, itchy eyes) -Claritin or loratadine cheapest but likely the weakest  -Zyrtec or certizine at night because it can make you sleepy -The strongest is allegra or fexafinadine  Cheapest at [employer], [employer], [employer]  Cough  -Dextromethorphan (Delsym)- medicine that has DM in it -Guafenesin (Mucinex/Robitussin) - cough drops - drink lots of water  Chest Congestion  -Guafenesin (Mucinex/Robitussin)  Red Itchy Eyes  - Naphcon-A  Upset Stomach  - Bland diet (nothing spicy, greasy, fried, and high acid foods like tomatoes, oranges, berries) -OKAY- cereal, bread, soup, crackers, rice -Eat smaller more frequent meals -reduce caffeine, no alcohol -Loperamide (Imodium-AD) if diarrhea -Prevacid for heart burn  General health when sick  -Hydration -wash your hands frequently -keep surfaces clean -change pillow cases and sheets often -Get fresh air but do not exercise strenuously -Vitamin D, double up on it - Vitamin C -Zinc

## 2017-03-11 ENCOUNTER — Telehealth: Payer: Self-pay | Admitting: *Deleted

## 2017-03-11 ENCOUNTER — Ambulatory Visit: Payer: 59 | Admitting: Cardiology

## 2017-03-11 NOTE — Telephone Encounter (Signed)
LMOVM OF NORMAL RESULTS AND TO CALL CLINIC BACK FOR ANY QUESTIONS OR CONCERNED

## 2017-03-11 NOTE — Telephone Encounter (Signed)
-----   Message from [facility] [place] sent at 03/06/2017  9:36 AM EST ----- Please let the patient know I reviewed her stress test with Dr. Curt Bears, looks OK to continue with her medicine unchanged.  To keep her scheduled appointment next month.  Thanks [employer]

## 2017-03-14 ENCOUNTER — Other Ambulatory Visit: Payer: Self-pay | Admitting: *Deleted

## 2017-03-14 DIAGNOSIS — I4819 Other persistent atrial fibrillation: Secondary | ICD-10-CM

## 2017-03-20 ENCOUNTER — Ambulatory Visit: Payer: 59 | Admitting: Cardiology

## 2017-03-21 ENCOUNTER — Other Ambulatory Visit: Payer: Self-pay | Admitting: Internal Medicine

## 2017-04-02 ENCOUNTER — Encounter: Payer: Self-pay | Admitting: *Deleted

## 2017-04-04 ENCOUNTER — Ambulatory Visit (HOSPITAL_COMMUNITY): Payer: 59

## 2017-04-04 ENCOUNTER — Encounter: Payer: Self-pay | Admitting: *Deleted

## 2017-04-04 ENCOUNTER — Encounter: Payer: Self-pay | Admitting: Cardiology

## 2017-04-04 ENCOUNTER — Other Ambulatory Visit: Payer: Self-pay | Admitting: Cardiology

## 2017-04-04 ENCOUNTER — Ambulatory Visit (INDEPENDENT_AMBULATORY_CARE_PROVIDER_SITE_OTHER): Payer: 59 | Admitting: Cardiology

## 2017-04-04 ENCOUNTER — Ambulatory Visit (HOSPITAL_COMMUNITY)
Admission: RE | Admit: 2017-04-04 | Discharge: 2017-04-04 | Disposition: A | Discharge status: Home / Self Care | Payer: 59 | Source: Ambulatory Visit | Attending: Cardiology | Admitting: Cardiology

## 2017-04-04 VITALS — BP 120/84 | HR 136 | Ht 65.5 in | Wt 232.0 lb

## 2017-04-04 DIAGNOSIS — I4819 Other persistent atrial fibrillation: Secondary | ICD-10-CM

## 2017-04-04 DIAGNOSIS — I483 Typical atrial flutter: Secondary | ICD-10-CM | POA: Diagnosis not present

## 2017-04-04 DIAGNOSIS — I481 Persistent atrial fibrillation: Secondary | ICD-10-CM

## 2017-04-04 DIAGNOSIS — I10 Essential (primary) hypertension: Secondary | ICD-10-CM

## 2017-04-04 DIAGNOSIS — I4891 Unspecified atrial fibrillation: Secondary | ICD-10-CM | POA: Diagnosis not present

## 2017-04-04 DIAGNOSIS — I517 Cardiomegaly: Secondary | ICD-10-CM | POA: Diagnosis not present

## 2017-04-04 DIAGNOSIS — G4733 Obstructive sleep apnea (adult) (pediatric): Secondary | ICD-10-CM | POA: Diagnosis not present

## 2017-04-04 MED ORDER — METOPROLOL TARTRATE 5 MG/5ML IV SOLN
INTRAVENOUS | Status: AC
  Filled 2017-04-04: qty 10

## 2017-04-04 MED ORDER — LOSARTAN POTASSIUM 25 MG PO TABS: 25.0000 mg | ORAL_TABLET | Freq: Every day | ORAL | 3 refills | Status: DC

## 2017-04-04 MED ORDER — IOPAMIDOL (ISOVUE-370) INJECTION 76%
INTRAVENOUS | Status: AC
  Administered 2017-04-04: 80 mL
  Filled 2017-04-04: qty 100

## 2017-04-04 MED ORDER — METOPROLOL TARTRATE 5 MG/5ML IV SOLN
5.0000 mg | INTRAVENOUS | Status: DC | PRN
  Administered 2017-04-04: 5 mg via INTRAVENOUS
  Filled 2017-04-04: qty 5

## 2017-04-04 NOTE — Addendum Note (Signed)
Addended by: Allegra Lai M on: 04/04/2017 04:51 PM   Modules accepted: Orders

## 2017-04-04 NOTE — Patient Instructions (Addendum)
Medication Instructions:  Your physician has recommended you make the following change in your medication: 1. STOP Lisinopril 2. START Losartan 25 mg once daily  * If you need a refill on your cardiac medications before your next appointment, please call your pharmacy. *  Labwork: Pre procedure labs today: BMET, CBC w/ diff  Testing/Procedures: Your physician has recommended that you have an ablation. Catheter ablation is a medical procedure used to treat some cardiac arrhythmias (irregular heartbeats). During catheter ablation, a long, thin, flexible tube is put into a blood vessel in your groin (upper thigh), or neck. This tube is called an ablation catheter. It is then guided to your heart through the blood vessel. Radio frequency waves destroy small areas of heart tissue where abnormal heartbeats may cause an arrhythmia to start. Please see the instruction sheet given to you today.  Follow-Up: Your physician recommends that you schedule a follow-up appointment in: 4 weeks, after your procedure on 04/11/17, with Roderic Palau in the  AFib clinic.  Your physician recommends that you schedule a follow-up appointment in: 3 months, after your procedure on 04/11/17, with Dr. Curt Bears.  Thank you for choosing [facility]!!   Trinidad Curet, RN [phone]  Any Other Special Instructions Will Be Listed Below (If Applicable).   Cardiac Ablation Cardiac ablation is a procedure to disable (ablate) a small amount of heart tissue in very specific places. The heart has many electrical connections. Sometimes these connections are abnormal and can cause the heart to beat very fast or irregularly. Ablating some of the problem areas can improve the heart rhythm or return it to normal. Ablation may be done for people who:  Have Wolff-Parkinson-White syndrome.  Have fast heart rhythms (tachycardia).  Have taken medicines for an abnormal heart rhythm (arrhythmia) that were not effective or caused  side effects.  Have a high-risk heartbeat that may be life-threatening.  During the procedure, a small incision is made in the neck or the groin, and a long, thin, flexible tube (catheter) is inserted into the incision and moved to the heart. Small devices (electrodes) on the tip of the catheter will send out electrical currents. A type of X-ray (fluoroscopy) will be used to help guide the catheter and to provide images of the heart. Tell a health care provider about:  Any allergies you have.  All medicines you are taking, including vitamins, herbs, eye drops, creams, and over-the-counter medicines.  Any problems you or family members have had with anesthetic medicines.  Any blood disorders you have.  Any surgeries you have had.  Any medical conditions you have, such as kidney failure.  Whether you are pregnant or may be pregnant. What are the risks? Generally, this is a safe procedure. However, problems may occur, including:  Infection.  Bruising and bleeding at the catheter insertion site.  Bleeding into the chest, especially into the sac that surrounds the heart. This is a serious complication.  Stroke or blood clots.  Damage to other structures or organs.  Allergic reaction to medicines or dyes.  Need for a permanent pacemaker if the normal electrical system is damaged. A pacemaker is a small computer that sends electrical signals to the heart and helps your heart beat normally.  The procedure not being fully effective. This may not be recognized until months later. Repeat ablation procedures are sometimes required.  What happens before the procedure?  Follow instructions from your health care provider about eating or drinking restrictions.  Ask your health care provider  about: ? Changing or stopping your regular medicines. This is especially important if you are taking diabetes medicines or blood thinners. ? Taking medicines such as aspirin and ibuprofen. These  medicines can thin your blood. Do not take these medicines before your procedure if your health care provider instructs you not to.  Plan to have someone take you home from the hospital or clinic.  If you will be going home right after the procedure, plan to have someone with you for 24 hours. What happens during the procedure?  To lower your risk of infection: ? Your health care team will wash or sanitize their hands. ? Your skin will be washed with soap. ? Hair may be removed from the incision area.  An IV tube will be inserted into one of your veins.  You will be given a medicine to help you relax (sedative).  The skin on your neck or groin will be numbed.  An incision will be made in your neck or your groin.  A needle will be inserted through the incision and into a large vein in your neck or groin.  A catheter will be inserted into the needle and moved to your heart.  Dye may be injected through the catheter to help your surgeon see the area of the heart that needs treatment.  Electrical currents will be sent from the catheter to ablate heart tissue in desired areas. There are three types of energy that may be used to ablate heart tissue: ? Heat (radiofrequency energy). ? Laser energy. ? Extreme cold (cryoablation).  When the necessary tissue has been ablated, the catheter will be removed.  Pressure will be held on the catheter insertion area to prevent excessive bleeding.  A bandage (dressing) will be placed over the catheter insertion area. The procedure may vary among health care providers and hospitals. What happens after the procedure?  Your blood pressure, heart rate, breathing rate, and blood oxygen level will be monitored until the medicines you were given have worn off.  Your catheter insertion area will be monitored for bleeding. You will need to lie still for a few hours to ensure that you do not bleed from the catheter insertion area.  Do not drive for 24  hours or as long as directed by your health care provider. Summary  Cardiac ablation is a procedure to disable (ablate) a small amount of heart tissue in very specific places. Ablating some of the problem areas can improve the heart rhythm or return it to normal.  During the procedure, electrical currents will be sent from the catheter to ablate heart tissue in desired areas. This information is not intended to replace advice given to you by your health care provider. Make sure you discuss any questions you have with your health care provider. Document Released: 08/19/2008 Document Revised: 02/20/2016 Document Reviewed: 02/20/2016 Elsevier Interactive Patient Education  [employer].

## 2017-04-04 NOTE — Progress Notes (Addendum)
Electrophysiology Office Note   Date:  04/04/2017   ID:  Monica Morgan, DOB 14-Jul-1960, MRN [id number]  PCP:  Unk Pinto, MD  Cardiologist:  Monica Morgan Primary Electrophysiologist:  Monica Stahle Meredith Leeds, MD    Chief Complaint  Patient presents with  . Follow-up    Persistent Afib/H&P Pre labs     History of Present Illness: Monica Morgan is a 56 y.o. female who presents today for electrophysiology evaluation.   Recent admission for tikosyn loading.  Had TEE/CV which restored sinus rhythm.  TEE noted EF 35-40%.  Discharged in sinus rhythm.  Continuing to have AF symptoms. Had AF ablation 01/13/16. Taken off tikosyn 12/17. She is felt well since last being seen. She presented to clinic in atrial flutter. She was in and out of atrial flutter and thus no cardioversion was performed.  Patient for typical atrial flutter on 03/11/17.  During the procedure, patient also had left atrial arrhythmias.  Plan for repeat atrial fibrillation ablation on 04/11/17.  Today, denies symptoms of palpitations, chest pain, shortness of breath, orthopnea, PND, lower extremity edema, claudication, dizziness, presyncope, syncope, bleeding, or neurologic sequela. The patient is tolerating medications without difficulties.  She continues to have issues with fatigue and weakness.  She does not note palpitations.  She has had more atrial fibrillation and likely atypical atrial flutter since her most recent procedure.   Past Medical History:  Diagnosis Date  . Arthritis    "probably in my knees" (10/25/2015)  . Asthma attack X 1 10/2015  . Atrial flutter ([place]) 10/2015  . Heart murmur   . Hepatitis 1972  . Hypothyroidism   . Malignant melanoma of leg ([place])    "right thigh"  . PAF (paroxysmal atrial fibrillation) ([place])   . Pneumonia 1966; 1967   "left lung collapsed one of these times"  . Psoriasis   . Rosacea   . Vitamin D deficiency    Past Surgical History:  Procedure Laterality Date  . A-FLUTTER  ABLATION N/A 02/08/2017   Procedure: A-Flutter Ablation;  Surgeon: Monica Haw, MD;  Location: [facility];  Service: Cardiovascular;  Laterality: N/A;  . CARDIOVERSION N/A 10/25/2015   Procedure: CARDIOVERSION;  Surgeon: Monica Margarita, MD;  Location: [facility];  Service: Cardiovascular;  Laterality: N/A;  . CERVICAL ABLATION  ~ 2012  . CESAREAN SECTION  1980  . CESAREAN SECTION WITH BILATERAL TUBAL LIGATION  1982  . ELECTROPHYSIOLOGIC STUDY N/A 01/13/2016   Procedure: Atrial Fibrillation Ablation;  Surgeon: Monica Antonelli Meredith Leeds, MD;  Location: [facility];  Service: Cardiovascular;  Laterality: N/A;  . MELANOMA EXCISION Right 10/1975   outer thigh "malignant"  . REFRACTIVE SURGERY Bilateral 1990s  . TEE WITHOUT CARDIOVERSION N/A 10/25/2015   Procedure: TRANSESOPHAGEAL ECHOCARDIOGRAM (TEE);  Surgeon: Monica Margarita, MD;  Location: [facility];  Service: Cardiovascular;  Laterality: N/A;  . TEE WITHOUT CARDIOVERSION N/A 01/11/2016   Procedure: TRANSESOPHAGEAL ECHOCARDIOGRAM (TEE);  Surgeon: Monica Hector, MD;  Location: [facility];  Service: Cardiovascular;  Laterality: N/A;  . TONSILLECTOMY  1967     Current Outpatient Medications  Medication Sig Dispense Refill  . augmented betamethasone dipropionate (DIPROLENE-AF) 0.05 % cream Apply 1 application topically daily as needed (for psoriasis).     . Calcium 600-400 MG-UNIT CHEW Chew 1 each by mouth 2 (two) times daily.     . carvedilol (COREG) 6.25 MG tablet Take 6.25 mg by mouth 2 (two) times daily with a meal.     . Cholecalciferol (  VITAMIN D PO) Take 10,000 Units by mouth daily.     . Coenzyme Q10 (CO Q10) 100 MG CAPS Take 100 mg by mouth daily.     Marland Kitchen ELIQUIS 5 MG TABS tablet TAKE 1 TABLET (5 MG TOTAL) BY MOUTH 2 (TWO) TIMES DAILY. 60 tablet 5  . flecainide (TAMBOCOR) 100 MG tablet Take 1 tablet (100 mg total) by mouth 2 (two) times daily. 60 tablet 6  . furosemide (LASIX) 20 MG tablet Take 1 tablet (20 mg total)  by mouth as needed (take 1 tablet daily as needed for 3-5lb weight gain, increased swelling). 30 tablet 2  . ibuprofen (ADVIL,MOTRIN) 200 MG tablet Take 600 mg by mouth every 6 (six) hours as needed for headache or moderate pain.    Marland Kitchen levothyroxine (SYNTHROID, LEVOTHROID) 50 MCG tablet TAKE 1 TABLET (50 MCG TOTAL) BY MOUTH DAILY. 90 tablet 1  . loratadine (CLARITIN) 10 MG tablet TAKE 10 MG BY MOUTH DAILY AS NEEDED FOR ALLERGY RELIEF  0  . Magnesium 500 MG TABS Take 500 mg by mouth every evening.     . montelukast (SINGULAIR) 10 MG tablet TAKE 1 TABLET BY MOUTH EVERY DAY (Patient taking differently: TAKE 10 MG BY MOUTH EVERY DAY) 90 tablet 1  . Multiple Vitamin (MULTIVITAMIN WITH MINERALS) TABS tablet Take 1 tablet by mouth daily.    . NONFORMULARY OR COMPOUNDED ITEM Place 1 each vaginally See admin instructions. Estriol/testosterone 0.25-0.25 vaginal suppository compound. Insert vaginally on Sunday, Monday and Wednesday    . NONFORMULARY OR COMPOUNDED ITEM Place 1 application onto the skin See admin instructions. Biest 8/2 HRT compound  Apply topically every day except Saturday.    . NONFORMULARY OR COMPOUNDED ITEM Apply 1 each topically See admin instructions. Progesterone 8%/78ml Apply 0.22mls topically every day except Saturday.    Vladimir Faster Glycol-Propyl Glycol (SYSTANE OP) Place 1 drop into both eyes daily.    . Probiotic Product (PROBIOTIC DAILY PO) Take 1 tablet by mouth daily.    . traMADol (ULTRAM) 50 MG tablet Take 1 tablet qid PRN for pain. 30 tablet 0  . Turmeric 500 MG CAPS Take 500 mg by mouth 2 (two) times daily.    Marland Kitchen losartan (COZAAR) 25 MG tablet Take 1 tablet (25 mg total) by mouth daily. 30 tablet 3   No current facility-administered medications for this visit.    Facility-Administered Medications Ordered in Other Visits  Medication Dose Route Frequency Provider Last Rate Last Dose  . metoprolol tartrate (LOPRESSOR) 5 MG/5ML injection           . metoprolol tartrate  (LOPRESSOR) injection 5 mg  5 mg Intravenous Q5 min PRN Monica Hector, MD   5 mg at 04/04/17 1309    Allergies:   Lipitor [atorvastatin]; Metoprolol; Rythmol [propafenone]; and Sulfa drugs cross reactors   Social History:  The patient  reports that  has never smoked. she has never used smokeless tobacco. She reports that she drinks about 2.4 oz of alcohol per week. She reports that she does not use drugs.   Family History:  The patient's family history includes Diabetes in her father; Heart attack in her brother and mother; Heart disease in her father and mother; Hypertension in her father and mother.    ROS:  Please see the history of present illness.   Otherwise, review of systems is positive for rotation, cough, wheezing, back pain.   All other systems are reviewed and negative.   PHYSICAL EXAM: VS:  BP 120/84  Pulse (!) 136   Ht 5' 5.5" (1.664 m)   Wt 232 lb (105.2 kg)   LMP 10/29/2013 (Approximate)   BMI 38.02 kg/m  , BMI Body mass index is 38.02 kg/m. GEN: Well nourished, well developed, in no acute distress  HEENT: normal  Neck: no JVD, carotid bruits, or masses Cardiac: Tachycardic, irregular; no murmurs, rubs, or gallops,no edema  Respiratory:  clear to auscultation bilaterally, normal work of breathing GI: soft, nontender, nondistended, + BS MS: no deformity or atrophy  Skin: warm and dry Neuro:  Strength and sensation are intact Psych: euthymic mood, full affect  EKG:  EKG is not ordered today. Personal review of the ekg ordered 02/08/17 shows sinus rhythm, rate 67, anterior TWI    Recent Labs: 08/16/2016: ALT 13; Magnesium 2.4; TSH 2.11 02/04/2017: BUN 17; Creatinine, Ser 0.90; Hemoglobin 13.9; Platelets 357; Potassium 4.4; Sodium 141    Lipid Panel     Component Value Date/Time   CHOL 218 (H) 08/16/2016 1647   TRIG 230 (H) 08/16/2016 1647   HDL 38 (L) 08/16/2016 1647   CHOLHDL 5.7 (H) 08/16/2016 1647   VLDL 46 (H) 08/16/2016 1647   LDLCALC 134 (H)  08/16/2016 1647     Wt Readings from Last 3 Encounters:  04/04/17 232 lb (105.2 kg)  03/04/17 228 lb 9.6 oz (103.7 kg)  02/08/17 220 lb (99.8 kg)      Other studies Reviewed: Additional studies/ records that were reviewed today include: TTE 12/19/16 Review of the above records today demonstrates:  - Left ventricle: The cavity size was normal. Wall thickness was   normal. Systolic function was normal. The estimated ejection   fraction was in the range of 55% to 60%. Wall motion was normal;   there were no regional wall motion abnormalities. Features are   consistent with a pseudonormal left ventricular filling pattern,   with concomitant abnormal relaxation and increased filling   pressure (grade 2 diastolic dysfunction). - Aortic valve: There was no stenosis. - Mitral valve: There was mild regurgitation. - Left atrium: The atrium was mildly dilated. - Right ventricle: The cavity size was normal. Systolic function   was normal. - Right atrium: The atrium was mildly dilated. - Tricuspid valve: Peak RV-RA gradient (S): 32 mm Hg. - Pulmonary arteries: PA peak pressure: 35 mm Hg (S). - Inferior vena cava: The vessel was normal in size. The   respirophasic diameter changes were in the normal range (>= 50%),   consistent with normal central venous pressure.   ASSESSMENT AND PLAN:  1.  Persistent atrial fibrillation: Currently on Eliquis.  Has had both in atrial fibrillation and atrial flutter ablation in the past.  Remains with left atrial arrhythmias.  Plan for repeat atrial fibrillation ablation on 04/10/17.  This patients CHA2DS2-VASc Score and unadjusted Ischemic Stroke Rate (% per year) is equal to 3.2 % stroke rate/year from a score of 3  Above score calculated as 1 point each if present [CHF, HTN, DM, Vascular=MI/PAD/Aortic Plaque, Age if 65-74, or Female] Above score calculated as 2 points each if present [Age > 75, or Stroke/TIA/TE]  2. Hypertension: Until today.  No  changes.  3. Mild OSA: Continue to try conservative measures for therapy.  No changes.  4. Atrial flutter, typical: Had ablation 02/08/17.  Was in and out of atrial fibrillation during that time.  Plan for re-ablation for atrial fibrillation 04/10/17.  Current medicines are reviewed at length with the patient today.   The patient does not  have concerns regarding her medicines.  The following changes were made today: None  Labs/ tests ordered today include:  Orders Placed This Encounter  Procedures  . Basic Metabolic Panel (BMET)  . CBC     Disposition:   FU with Antonious Omahoney 3 months  Signed, Marven Veley Meredith Leeds, MD  04/04/2017 4:51 PM     [address] [place] [address] [phone] (office) [phone] (fax)

## 2017-04-04 NOTE — Addendum Note (Signed)
Addended by: Stanton Kidney on: 04/04/2017 04:38 PM   Modules accepted: Orders

## 2017-04-05 LAB — CBC
Hematocrit: 42.1 % (ref 34.0–46.6)
Hemoglobin: 13.9 g/dL (ref 11.1–15.9)
MCH: 29 pg (ref 26.6–33.0)
MCHC: 33 g/dL (ref 31.5–35.7)
MCV: 88 fL (ref 79–97)
Platelets: 343 10*3/uL (ref 150–379)
RBC: 4.8 x10E6/uL (ref 3.77–5.28)
RDW: 13.8 % (ref 12.3–15.4)
WBC: 12.3 10*3/uL — ABNORMAL HIGH (ref 3.4–10.8)

## 2017-04-05 LAB — BASIC METABOLIC PANEL
BUN/Creatinine Ratio: 14 (ref 9–23)
BUN: 18 mg/dL (ref 6–24)
CO2: 25 mmol/L (ref 20–29)
Calcium: 9.7 mg/dL (ref 8.7–10.2)
Chloride: 98 mmol/L (ref 96–106)
Creatinine, Ser: 1.29 mg/dL — ABNORMAL HIGH (ref 0.57–1.00)
GFR calc Af Amer: 53 mL/min/{1.73_m2} — ABNORMAL LOW (ref >59)
GFR calc non Af Amer: 46 mL/min/{1.73_m2} — ABNORMAL LOW (ref >59)
Glucose: 76 mg/dL (ref 65–99)
Potassium: 4.7 mmol/L (ref 3.5–5.2)
Sodium: 141 mmol/L (ref 134–144)

## 2017-04-11 ENCOUNTER — Encounter (HOSPITAL_COMMUNITY)
Admission: RE | Disposition: A | Discharge status: Home / Self Care | Payer: Self-pay | Source: Ambulatory Visit | Attending: Cardiology

## 2017-04-11 ENCOUNTER — Ambulatory Visit (HOSPITAL_COMMUNITY): Payer: 59 | Admitting: Anesthesiology

## 2017-04-11 ENCOUNTER — Ambulatory Visit (HOSPITAL_COMMUNITY)
Admission: RE | Admit: 2017-04-11 | Discharge: 2017-04-12 | Disposition: A | Discharge status: Home / Self Care | Payer: 59 | Source: Ambulatory Visit | Attending: Cardiology | Admitting: Cardiology

## 2017-04-11 ENCOUNTER — Other Ambulatory Visit: Payer: Self-pay

## 2017-04-11 ENCOUNTER — Encounter (HOSPITAL_COMMUNITY): Payer: Self-pay | Admitting: Cardiology

## 2017-04-11 DIAGNOSIS — G4733 Obstructive sleep apnea (adult) (pediatric): Secondary | ICD-10-CM | POA: Diagnosis not present

## 2017-04-11 DIAGNOSIS — Z85820 Personal history of malignant melanoma of skin: Secondary | ICD-10-CM | POA: Insufficient documentation

## 2017-04-11 DIAGNOSIS — Z79890 Hormone replacement therapy (postmenopausal): Secondary | ICD-10-CM | POA: Insufficient documentation

## 2017-04-11 DIAGNOSIS — L409 Psoriasis, unspecified: Secondary | ICD-10-CM | POA: Diagnosis not present

## 2017-04-11 DIAGNOSIS — J45909 Unspecified asthma, uncomplicated: Secondary | ICD-10-CM | POA: Insufficient documentation

## 2017-04-11 DIAGNOSIS — I4891 Unspecified atrial fibrillation: Secondary | ICD-10-CM | POA: Diagnosis present

## 2017-04-11 DIAGNOSIS — Z6837 Body mass index (BMI) 37.0-37.9, adult: Secondary | ICD-10-CM | POA: Diagnosis not present

## 2017-04-11 DIAGNOSIS — E039 Hypothyroidism, unspecified: Secondary | ICD-10-CM | POA: Diagnosis not present

## 2017-04-11 DIAGNOSIS — I484 Atypical atrial flutter: Secondary | ICD-10-CM | POA: Insufficient documentation

## 2017-04-11 DIAGNOSIS — Z79891 Long term (current) use of opiate analgesic: Secondary | ICD-10-CM | POA: Insufficient documentation

## 2017-04-11 DIAGNOSIS — Z7901 Long term (current) use of anticoagulants: Secondary | ICD-10-CM | POA: Insufficient documentation

## 2017-04-11 DIAGNOSIS — E559 Vitamin D deficiency, unspecified: Secondary | ICD-10-CM | POA: Insufficient documentation

## 2017-04-11 DIAGNOSIS — Z79899 Other long term (current) drug therapy: Secondary | ICD-10-CM | POA: Diagnosis not present

## 2017-04-11 DIAGNOSIS — I481 Persistent atrial fibrillation: Secondary | ICD-10-CM | POA: Insufficient documentation

## 2017-04-11 DIAGNOSIS — I10 Essential (primary) hypertension: Secondary | ICD-10-CM | POA: Insufficient documentation

## 2017-04-11 HISTORY — PX: ATRIAL FIBRILLATION ABLATION: EP1191

## 2017-04-11 HISTORY — DX: Pure hypercholesterolemia, unspecified: E78.00

## 2017-04-11 LAB — POCT ACTIVATED CLOTTING TIME
Activated Clotting Time: 268 seconds
Activated Clotting Time: 290 seconds
Activated Clotting Time: 318 seconds
Activated Clotting Time: 324 seconds
Activated Clotting Time: 153 seconds

## 2017-04-11 SURGERY — ATRIAL FIBRILLATION ABLATION
Anesthesia: General

## 2017-04-11 MED ORDER — APIXABAN 5 MG PO TABS
5.0000 mg | ORAL_TABLET | Freq: Two times a day (BID) | ORAL | Status: DC
  Administered 2017-04-11 – 2017-04-12 (×2): 5 mg via ORAL
  Filled 2017-04-11 (×3): qty 1

## 2017-04-11 MED ORDER — LOSARTAN POTASSIUM 25 MG PO TABS
25.0000 mg | ORAL_TABLET | Freq: Every day | ORAL | Status: DC
  Administered 2017-04-11 – 2017-04-12 (×2): 25 mg via ORAL
  Filled 2017-04-11 (×2): qty 1

## 2017-04-11 MED ORDER — GUAIFENESIN-CODEINE 100-10 MG/5ML PO SOLN
ORAL | Status: AC
  Filled 2017-04-11: qty 5

## 2017-04-11 MED ORDER — HEPARIN (PORCINE) IN NACL 2-0.9 UNIT/ML-% IJ SOLN
INTRAMUSCULAR | Status: AC
  Filled 2017-04-11: qty 500

## 2017-04-11 MED ORDER — CO Q10 100 MG PO CAPS: 100.0000 mg | ORAL_CAPSULE | Freq: Every day | ORAL | Status: DC

## 2017-04-11 MED ORDER — CALCIUM CARBONATE-VITAMIN D 500-200 MG-UNIT PO TABS
1.0000 | ORAL_TABLET | Freq: Two times a day (BID) | ORAL | Status: DC
  Administered 2017-04-11 – 2017-04-12 (×2): 1 via ORAL
  Filled 2017-04-11 (×2): qty 1

## 2017-04-11 MED ORDER — PROTAMINE SULFATE 10 MG/ML IV SOLN
INTRAVENOUS | Status: DC | PRN
  Administered 2017-04-11 (×2): 10 mg via INTRAVENOUS
  Administered 2017-04-11: 20 mg via INTRAVENOUS

## 2017-04-11 MED ORDER — FLECAINIDE ACETATE 100 MG PO TABS
100.0000 mg | ORAL_TABLET | Freq: Two times a day (BID) | ORAL | Status: DC
  Administered 2017-04-11 – 2017-04-12 (×2): 100 mg via ORAL
  Filled 2017-04-11 (×2): qty 1

## 2017-04-11 MED ORDER — PHENYLEPHRINE HCL 10 MG/ML IJ SOLN
INTRAMUSCULAR | Status: DC | PRN
  Administered 2017-04-11: 50 ug/min via INTRAVENOUS

## 2017-04-11 MED ORDER — ONDANSETRON HCL 4 MG/2ML IJ SOLN: 4.0000 mg | Freq: Four times a day (QID) | INTRAMUSCULAR | Status: DC | PRN

## 2017-04-11 MED ORDER — FENTANYL CITRATE (PF) 100 MCG/2ML IJ SOLN
INTRAMUSCULAR | Status: DC | PRN
  Administered 2017-04-11: 100 ug via INTRAVENOUS

## 2017-04-11 MED ORDER — MAGNESIUM 500 MG PO TABS: 500.0000 mg | ORAL_TABLET | Freq: Every evening | ORAL | Status: DC

## 2017-04-11 MED ORDER — ACETAMINOPHEN 325 MG PO TABS: 650.0000 mg | ORAL_TABLET | ORAL | Status: DC | PRN

## 2017-04-11 MED ORDER — SODIUM CHLORIDE 0.9% FLUSH
3.0000 mL | Freq: Two times a day (BID) | INTRAVENOUS | Status: DC
  Administered 2017-04-11 – 2017-04-12 (×3): 3 mL via INTRAVENOUS

## 2017-04-11 MED ORDER — CARVEDILOL 6.25 MG PO TABS
6.2500 mg | ORAL_TABLET | Freq: Two times a day (BID) | ORAL | Status: DC
  Administered 2017-04-11 – 2017-04-12 (×2): 6.25 mg via ORAL
  Filled 2017-04-11 (×2): qty 1

## 2017-04-11 MED ORDER — MIDAZOLAM HCL 5 MG/5ML IJ SOLN
INTRAMUSCULAR | Status: DC | PRN
  Administered 2017-04-11: 2 mg via INTRAVENOUS

## 2017-04-11 MED ORDER — GUAIFENESIN-CODEINE 100-10 MG/5ML PO SOLN
5.0000 mL | ORAL | Status: DC | PRN
  Administered 2017-04-11: 5 mL via ORAL

## 2017-04-11 MED ORDER — CALCIUM 600-400 MG-UNIT PO CHEW: 1.0000 | CHEWABLE_TABLET | Freq: Two times a day (BID) | ORAL | Status: DC

## 2017-04-11 MED ORDER — FUROSEMIDE 10 MG/ML IJ SOLN
40.0000 mg | Freq: Once | INTRAMUSCULAR | Status: AC
  Administered 2017-04-11: 40 mg via INTRAVENOUS

## 2017-04-11 MED ORDER — TRAMADOL HCL 50 MG PO TABS: 50.0000 mg | ORAL_TABLET | Freq: Four times a day (QID) | ORAL | Status: DC | PRN

## 2017-04-11 MED ORDER — FUROSEMIDE 10 MG/ML IJ SOLN
INTRAMUSCULAR | Status: AC
  Filled 2017-04-11: qty 4

## 2017-04-11 MED ORDER — SODIUM CHLORIDE 0.9 % IV SOLN: 250.0000 mL | INTRAVENOUS | Status: DC | PRN

## 2017-04-11 MED ORDER — MONTELUKAST SODIUM 10 MG PO TABS
10.0000 mg | ORAL_TABLET | Freq: Every day | ORAL | Status: DC
  Administered 2017-04-11 – 2017-04-12 (×2): 10 mg via ORAL
  Filled 2017-04-11 (×2): qty 1

## 2017-04-11 MED ORDER — PHENYLEPHRINE HCL 10 MG/ML IJ SOLN
INTRAMUSCULAR | Status: DC | PRN
  Administered 2017-04-11 (×2): 80 ug via INTRAVENOUS

## 2017-04-11 MED ORDER — HEPARIN SODIUM (PORCINE) 1000 UNIT/ML IJ SOLN
INTRAMUSCULAR | Status: DC | PRN
  Administered 2017-04-11: 1000 [IU] via INTRAVENOUS

## 2017-04-11 MED ORDER — BUPIVACAINE HCL (PF) 0.25 % IJ SOLN
INTRAMUSCULAR | Status: DC | PRN
  Administered 2017-04-11: 30 mL

## 2017-04-11 MED ORDER — MAGNESIUM OXIDE 400 (241.3 MG) MG PO TABS
400.0000 mg | ORAL_TABLET | Freq: Every day | ORAL | Status: DC
  Administered 2017-04-11: 400 mg via ORAL
  Filled 2017-04-11: qty 1

## 2017-04-11 MED ORDER — LEVOTHYROXINE SODIUM 50 MCG PO TABS
50.0000 ug | ORAL_TABLET | Freq: Every day | ORAL | Status: DC
  Administered 2017-04-12: 50 ug via ORAL
  Filled 2017-04-11: qty 1

## 2017-04-11 MED ORDER — SODIUM CHLORIDE 0.9% FLUSH: 3.0000 mL | INTRAVENOUS | Status: DC | PRN

## 2017-04-11 MED ORDER — HEPARIN SODIUM (PORCINE) 1000 UNIT/ML IJ SOLN
INTRAMUSCULAR | Status: AC
  Filled 2017-04-11: qty 1

## 2017-04-11 MED ORDER — SODIUM CHLORIDE 0.9 % IV SOLN
INTRAVENOUS | Status: DC
  Administered 2017-04-11: 06:00:00 via INTRAVENOUS

## 2017-04-11 MED ORDER — PROPOFOL 10 MG/ML IV BOLUS
INTRAVENOUS | Status: DC | PRN
  Administered 2017-04-11: 200 mg via INTRAVENOUS

## 2017-04-11 MED ORDER — TURMERIC 500 MG PO CAPS: 500.0000 mg | ORAL_CAPSULE | Freq: Two times a day (BID) | ORAL | Status: DC

## 2017-04-11 MED ORDER — HEPARIN (PORCINE) IN NACL 2-0.9 UNIT/ML-% IJ SOLN
INTRAMUSCULAR | Status: AC | PRN
  Administered 2017-04-11: 2500 mL

## 2017-04-11 MED ORDER — BUPIVACAINE HCL (PF) 0.25 % IJ SOLN
INTRAMUSCULAR | Status: AC
  Filled 2017-04-11: qty 30

## 2017-04-11 MED ORDER — FUROSEMIDE 20 MG PO TABS: 20.0000 mg | ORAL_TABLET | ORAL | Status: DC | PRN

## 2017-04-11 MED ORDER — SUGAMMADEX SODIUM 200 MG/2ML IV SOLN
INTRAVENOUS | Status: DC | PRN
  Administered 2017-04-11: 200 mg via INTRAVENOUS

## 2017-04-11 MED ORDER — ONDANSETRON HCL 4 MG/2ML IJ SOLN
INTRAMUSCULAR | Status: DC | PRN
  Administered 2017-04-11: 4 mg via INTRAVENOUS

## 2017-04-11 MED ORDER — HEPARIN SODIUM (PORCINE) 1000 UNIT/ML IJ SOLN
INTRAMUSCULAR | Status: DC | PRN
  Administered 2017-04-11: 14000 [IU] via INTRAVENOUS
  Administered 2017-04-11: 2000 [IU] via INTRAVENOUS
  Administered 2017-04-11: 6000 [IU] via INTRAVENOUS
  Administered 2017-04-11: 2000 [IU] via INTRAVENOUS
  Administered 2017-04-11: 3000 [IU] via INTRAVENOUS

## 2017-04-11 MED ORDER — IBUPROFEN 600 MG PO TABS: 600.0000 mg | ORAL_TABLET | Freq: Four times a day (QID) | ORAL | Status: DC | PRN

## 2017-04-11 MED ORDER — ROCURONIUM BROMIDE 100 MG/10ML IV SOLN
INTRAVENOUS | Status: DC | PRN
  Administered 2017-04-11: 20 mg via INTRAVENOUS
  Administered 2017-04-11 (×2): 10 mg via INTRAVENOUS
  Administered 2017-04-11: 60 mg via INTRAVENOUS
  Administered 2017-04-11: 10 mg via INTRAVENOUS
  Administered 2017-04-11: 20 mg via INTRAVENOUS
  Administered 2017-04-11: 10 mg via INTRAVENOUS

## 2017-04-11 MED ORDER — ADULT MULTIVITAMIN W/MINERALS CH
1.0000 | ORAL_TABLET | Freq: Every day | ORAL | Status: DC
  Administered 2017-04-12: 1 via ORAL
  Filled 2017-04-11: qty 1

## 2017-04-11 MED ORDER — LIDOCAINE HCL (CARDIAC) 20 MG/ML IV SOLN
INTRAVENOUS | Status: DC | PRN
  Administered 2017-04-11: 80 mg via INTRAVENOUS

## 2017-04-11 SURGICAL SUPPLY — 19 items
BLANKET WARM UNDERBOD FULL ACC (MISCELLANEOUS) ×2 IMPLANT
CATH MAPPNG PENTARAY F 2-6-2MM (CATHETERS) ×1 IMPLANT
CATH SMTCH THERMOCOOL SF DF (CATHETERS) ×2 IMPLANT
CATH SOUNDSTAR 3D IMAGING (CATHETERS) ×2 IMPLANT
CATH WEBSTER BI DIR CS D-F CRV (CATHETERS) ×2 IMPLANT
COVER SWIFTLINK CONNECTOR (BAG) ×2 IMPLANT
PACK EP LATEX FREE (CUSTOM PROCEDURE TRAY) ×1
PACK EP LF (CUSTOM PROCEDURE TRAY) ×1 IMPLANT
PAD DEFIB LIFELINK (PAD) ×2 IMPLANT
PATCH CARTO3 (PAD) ×2 IMPLANT
PENTARAY F 2-6-2MM (CATHETERS) ×2
SHEATH AVANTI 11F 11CM (SHEATH) ×2 IMPLANT
SHEATH BAYLIS SUREFLEX  M 8.5 (SHEATH) ×2
SHEATH BAYLIS SUREFLEX M 8.5 (SHEATH) ×2 IMPLANT
SHEATH BAYLIS TRANSSEPTAL 98CM (NEEDLE) ×2 IMPLANT
SHEATH PINNACLE 7F 10CM (SHEATH) ×2 IMPLANT
SHEATH PINNACLE 8F 10CM (SHEATH) ×4 IMPLANT
SHEATH PINNACLE 9F 10CM (SHEATH) ×4 IMPLANT
TUBING SMART ABLATE COOLFLOW (TUBING) ×2 IMPLANT

## 2017-04-11 NOTE — Progress Notes (Addendum)
Site area: rt groin fv sheaths x2 Site Prior to Removal:  Level 0 Pressure Applied For: 30 minutes Manual:   yes Patient Status During Pull:  stable Post Pull Site:  Level 0 Post Pull Instructions Given:  yes Post Pull Pulses Present: palpable Dressing Applied:  Gauze and tegaderm Bedrest begins @  Comments: Patient is increasingly coughing. Dr. Curt Bears made aware. Orders in

## 2017-04-11 NOTE — Anesthesia Procedure Notes (Signed)
Procedure Name: Intubation Date/Time: 04/11/2017 7:54 AM Performed by: Rica Koyanagi, MD Pre-anesthesia Checklist: Patient identified, Emergency Drugs available, Suction available, Patient being monitored and Timeout performed Patient Re-evaluated:Patient Re-evaluated prior to induction Oxygen Delivery Method: Circle system utilized Preoxygenation: Pre-oxygenation with 100% oxygen Induction Type: IV induction Ventilation: Mask ventilation without difficulty Laryngoscope Size: Mac and 3 Grade View: Grade II Tube type: Oral Tube size: 7.5 mm Number of attempts: 1 Airway Equipment and Method: Stylet Placement Confirmation: ETT inserted through vocal cords under direct vision,  positive ETCO2 and breath sounds checked- equal and bilateral Secured at: 21 cm Tube secured with: Tape Dental Injury: Teeth and Oropharynx as per pre-operative assessment

## 2017-04-11 NOTE — Transfer of Care (Signed)
Immediate Anesthesia Transfer of Care Note  Patient: Monica Morgan  Procedure(s) Performed: ATRIAL FIBRILLATION ABLATION (N/A )  Patient Location: Cath Lab  Anesthesia Type:General  Level of Consciousness: awake, alert  and oriented  Airway & Oxygen Therapy: Patient Spontanous Breathing  Post-op Assessment: Post -op Vital signs reviewed and stable  Post vital signs: stable  Last Vitals:  Vitals:   04/11/17 0547  BP: (!) 161/98  Pulse: (!) 104  Temp: 36.8 C  SpO2: 98%    Last Pain:  Vitals:   04/11/17 0547  TempSrc: Oral      Patients Stated Pain Goal: 3 (01/64/29 0379)  Complications: No apparent anesthesia complications

## 2017-04-11 NOTE — H&P (Signed)
Monica Morgan has presented today for surgery, with the diagnosis of atrial fibrillation/flutter.  The various methods of treatment have been discussed with the patient and family. After consideration of risks, benefits and other options for treatment, the patient has consented to  Procedure(s): Catheter ablation as a surgical intervention .  Risks include but not limited to bleeding, tamponade, heart block, stroke, damage to surrounding organs, among others. The patient's history has been reviewed, patient examined, no change in status, stable for surgery.  I have reviewed the patient's chart and labs.  Questions were answered to the patient's satisfaction.    Jasmina Gendron Curt Bears, MD 04/11/2017 7:09 AM

## 2017-04-11 NOTE — Progress Notes (Signed)
In and out cath for 1400cc clear yellow uop. Coughing much less.

## 2017-04-11 NOTE — Anesthesia Preprocedure Evaluation (Addendum)
Anesthesia Evaluation  Patient identified by MRN, date of birth, ID band Patient awake    Reviewed: Allergy & Precautions, NPO status , Patient's Chart, lab work & pertinent test results  Airway Mallampati: II  TM Distance: >3 FB Neck ROM: Full    Dental no notable dental hx. (+) Teeth Intact   Pulmonary asthma ,    breath sounds clear to auscultation       Cardiovascular + dysrhythmias  Rhythm:Irregular Rate:Normal     Neuro/Psych negative neurological ROS     GI/Hepatic (+) Hepatitis -  Endo/Other  Hypothyroidism Morbid obesity  Renal/GU      Musculoskeletal  (+) Arthritis ,   Abdominal (+) + obese,   Peds  Hematology   Anesthesia Other Findings   Reproductive/Obstetrics                            Anesthesia Physical Anesthesia Plan  ASA: II  Anesthesia Plan: General   Post-op Pain Management:    Induction: Intravenous  PONV Risk Score and Plan: 3 and Treatment may vary due to age or medical condition  Airway Management Planned: Oral ETT  Additional Equipment:   Intra-op Plan:   Post-operative Plan: Extubation in OR  Informed Consent: I have reviewed the patients History and Physical, chart, labs and discussed the procedure including the risks, benefits and alternatives for the proposed anesthesia with the patient or authorized representative who has indicated his/her understanding and acceptance.   Dental advisory given  Plan Discussed with: CRNA  Anesthesia Plan Comments:        Anesthesia Quick Evaluation

## 2017-04-11 NOTE — Progress Notes (Signed)
Will watch pts temperature since it is slightly elevated at the first of shift.

## 2017-04-11 NOTE — Progress Notes (Signed)
Site area: left groin fv sheaths x2 pulled and pressure held by Eben Burow Site Prior to Removal:  Level 0 Pressure Applied For: 20 minutes Manual:   yes Patient Status During Pull:  stable Post Pull Site:  Level 0 Post Pull Instructions Given:  yes Post Pull Pulses Present: palpable Dressing Applied:  Gauze and tegaderm Bedrest begins @ 0626 Comments: IV saline locked

## 2017-04-11 NOTE — Anesthesia Postprocedure Evaluation (Signed)
Anesthesia Post Note  Patient: Monica Morgan  Procedure(s) Performed: ATRIAL FIBRILLATION ABLATION (N/A )     Patient location during evaluation: PACU Anesthesia Type: General Level of consciousness: awake and alert Pain management: pain level controlled Vital Signs Assessment: post-procedure vital signs reviewed and stable Respiratory status: spontaneous breathing, nonlabored ventilation, respiratory function stable and patient connected to nasal cannula oxygen Cardiovascular status: blood pressure returned to baseline and stable Postop Assessment: no apparent nausea or vomiting Anesthetic complications: no    Last Vitals:  Vitals:   04/11/17 1415 04/11/17 1419  BP: 139/74   Pulse: 65   Resp: (!) 9   Temp:  (!) 36.2 C  SpO2: 99%     Last Pain:  Vitals:   04/11/17 1419  TempSrc: Temporal                 Finis Hendricksen,JAMES TERRILL

## 2017-04-12 DIAGNOSIS — E039 Hypothyroidism, unspecified: Secondary | ICD-10-CM | POA: Diagnosis not present

## 2017-04-12 DIAGNOSIS — I484 Atypical atrial flutter: Secondary | ICD-10-CM | POA: Diagnosis not present

## 2017-04-12 DIAGNOSIS — I481 Persistent atrial fibrillation: Secondary | ICD-10-CM

## 2017-04-12 DIAGNOSIS — I10 Essential (primary) hypertension: Secondary | ICD-10-CM | POA: Diagnosis not present

## 2017-04-12 NOTE — Discharge Summary (Signed)
ELECTROPHYSIOLOGY PROCEDURE DISCHARGE SUMMARY    Patient ID: Monica Morgan,  MRN: [id number], DOB/AGE: 11-27-1960 56 y.o.  Admit date: 04/11/2017 Discharge date: 04/12/2017  Primary Care Physician: Unk Pinto, MD  Primary Cardiologist: Dr. Radford Pax Electrophysiologist: Dr. Curt Bears  Primary Discharge Diagnosis:  1. persistent AFib/flutter     CHA2DS2Vasc is 3, on Eliquis  Secondary Discharge Diagnosis:  1. HTN 2. NICM     EF has recovered   Procedures This Admission:  1.  Electrophysiology study and radiofrequency catheter ablation on 04/11/17 by Dr Curt Bears.   This study demonstrated   CONCLUSIONS: 1. Atrial fibrillation upon presentation.   2. Successful electrical isolation and anatomical encircling of all four pulmonary veins with radiofrequency current.  A WACA approach was used 3. Additional left atrial ablation was performed for left atrial flutter ablating along the left atrial roof, right superior pulmonary vein to mitral valve, and left inferior pulmonary vein to mitral valve 4.  Successful ablation of the caval tricuspid isthmus to treat typical atrial flutter  5. Atrial fibrillation successfully cardioverted to sinus rhythm. 6. No early apparent complications.   Brief HPI: Monica Morgan is a 56 y.o. female with a history of  Persistent atrial fibrillation/flutter.  She has failed medical therapy with Tiksoyn. Risks, benefits, and alternatives to catheter ablation of atrial fibrillation/flutter were reviewed with the patient who wished to proceed.  The patient underwent cardiac CT prior to the procedure which demonstrated no LAA thrombus.    Hospital Course:  The patient was admitted and underwent EPS/RFCA of atrial fibrillation with details as outlined above.  She was monitored on telemetry overnight which demonstrated SR with some PVCs, though not persistently frequent.  B/l groins were without complication on the day of discharge.  The patient was  examined  By Dr. Rayann Heman and considered to be stable for discharge.  Wound care and restrictions were reviewed with the patient.  The patient feels well, without complaints.  The patient will be seen back by Roderic Palau, NP in 4 weeks and Dr Rayann Heman in 12 weeks for post ablation follow up.      Physical Exam: Vitals:   04/11/17 1504 04/11/17 1854 04/11/17 2031 04/12/17 0532  BP: (!) 155/75  (!) 144/67 (!) 121/54  Pulse: 66  70 64  Resp: 13     Temp: 98.7 F (37.1 C) 99 F (37.2 C) 99.4 F (37.4 C) 99.2 F (37.3 C)  TempSrc: Oral  Oral Oral  SpO2: 100%  99% 97%  Weight:    232 lb 4.8 oz (105.4 kg)  Height:        GEN- The patient is well appearing, alert and oriented x 3 today.   HEENT: normocephalic, atraumatic; sclera clear, conjunctiva pink; hearing intact; oropharynx clear; neck supple  Lungs- CTA b/l, normal work of breathing.  No wheezes, rales, rhonchi Heart- RRR, no murmurs, rubs or gallops  GI- soft, non-tender Extremities- no clubbing, cyanosis, or edema; DP/PT/radial pulses 2+ bilaterally,  b/l groins without hematoma/bruit/bleeding MS- no significant deformity or atrophy Skin- warm and dry, no rash or lesion Psych- euthymic mood, full affect Neuro- strength and sensation are intact   Labs:   Lab Results  Component Value Date   WBC 12.3 (H) 04/04/2017   HGB 13.9 04/04/2017   HCT 42.1 04/04/2017   MCV 88 04/04/2017   PLT 343 04/04/2017   No results for input(s): NA, K, CL, CO2, BUN, CREATININE, CALCIUM, PROT, BILITOT, ALKPHOS, ALT, AST, GLUCOSE in the last 168 hours.  Invalid input(s): LABALBU   Discharge Medications:  Allergies as of 04/12/2017      Reactions   Lipitor [atorvastatin] Other (See Comments)   "neck pain"   Metoprolol Other (See Comments)   DIDN'T WORK FOR PATIENT AND GAINED WEIGHT MIGHT HAVE BEEN TAKEN WITH RYTHMOL   Rythmol [propafenone] Other (See Comments)   "weight gain"   Sulfa Drugs Cross Reactors Other (See Comments)    "soreness all over"      Medication List    TAKE these medications   augmented betamethasone dipropionate 0.05 % cream Commonly known as:  DIPROLENE-AF Apply 1 application topically daily as needed (for psoriasis).   Calcium 600-400 MG-UNIT Chew Chew 1 each by mouth 2 (two) times daily.   carvedilol 6.25 MG tablet Commonly known as:  COREG Take 6.25 mg by mouth 2 (two) times daily with a meal.   Co Q10 100 MG Caps Take 100 mg by mouth daily.   ELIQUIS 5 MG Tabs tablet Generic drug:  apixaban TAKE 1 TABLET (5 MG TOTAL) BY MOUTH 2 (TWO) TIMES DAILY.   flecainide 100 MG tablet Commonly known as:  TAMBOCOR Take 1 tablet (100 mg total) by mouth 2 (two) times daily.   furosemide 20 MG tablet Commonly known as:  LASIX Take 1 tablet (20 mg total) by mouth as needed (take 1 tablet daily as needed for 3-5lb weight gain, increased swelling).   ibuprofen 200 MG tablet Commonly known as:  ADVIL,MOTRIN Take 600 mg by mouth every 6 (six) hours as needed for headache or moderate pain.   levothyroxine 50 MCG tablet Commonly known as:  SYNTHROID, LEVOTHROID TAKE 1 TABLET (50 MCG TOTAL) BY MOUTH DAILY.   loratadine 10 MG tablet Commonly known as:  CLARITIN TAKE 10 MG BY MOUTH DAILY AS NEEDED FOR ALLERGY RELIEF   losartan 25 MG tablet Commonly known as:  COZAAR Take 1 tablet (25 mg total) by mouth daily.   Magnesium 500 MG Tabs Take 500 mg by mouth every evening.   montelukast 10 MG tablet Commonly known as:  SINGULAIR TAKE 1 TABLET BY MOUTH EVERY DAY What changed:    how much to take  how to take this  when to take this   multivitamin with minerals Tabs tablet Take 1 tablet by mouth daily.   NONFORMULARY OR COMPOUNDED ITEM Apply 1 each topically See admin instructions. Progesterone 8%/39ml Apply 0.63mls topically every day except Saturday.   NONFORMULARY OR COMPOUNDED ITEM Place 1 each vaginally See admin instructions. Estriol/testosterone 0.25-0.25 vaginal  suppository compound. Insert vaginally on Sunday, Monday and Wednesday   NONFORMULARY OR COMPOUNDED ITEM Place 1 application onto the skin See admin instructions. Biest 8/2 HRT compound  Apply topically every day except Saturday.   PROBIOTIC DAILY PO Take 1 tablet by mouth daily.   SYSTANE OP Place 1 drop into both eyes daily.   traMADol 50 MG tablet Commonly known as:  ULTRAM Take 1 tablet qid PRN for pain.   Turmeric 500 MG Caps Take 500 mg by mouth 2 (two) times daily.   VITAMIN D PO Take 10,000 Units by mouth daily.       Disposition:   Home  Follow-up Information    [facility] Follow up on 05/09/2017.   Specialty:  Cardiology Why:  8:30AM Contact information: [address] [id number] [place] [address] [phone]       Constance Haw, MD Follow up on 07/12/2017.   Specialty:  Cardiology Why:  3:30PM Contact information: [address] [place] [address] [phone]           Duration of Discharge Encounter: Greater than 30 minutes including physician time.  Signed, Tommye Standard, PA-C 04/12/2017 8:07 AM   I have seen, examined the patient, and reviewed the above assessment and plan.  Changes to above are made where necessary.  On exam, RRR.  Wants to go home.  Denies CP, SOB or any new concerns.  DC to home with routine follow-up.  Co Sign: Thompson Grayer, MD 04/12/2017 11:41 AM

## 2017-04-12 NOTE — Discharge Instructions (Signed)
No driving for 4 days. No lifting over 5 lbs for 1 week. No vigorous or sexual activity for 1 week. You may return to work in one week. Keep procedure site clean & dry. If you notice increased pain, swelling, bleeding or pus, call/return!  You may shower, but no soaking baths/hot tubs/pools for 1 week.  ° ° °

## 2017-04-12 NOTE — Progress Notes (Signed)
Pt discharged to home. PIV removed, AVS reviewed. Bilateral groin sites level 0 upon dc. Pt to follow up in clinic. Pt left unit via wheelchair, belongings in hand. Pt to be transported home by friend,  Monica Martinez.

## 2017-05-02 ENCOUNTER — Other Ambulatory Visit: Payer: Self-pay | Admitting: Internal Medicine

## 2017-05-06 ENCOUNTER — Other Ambulatory Visit: Payer: Self-pay | Admitting: Cardiology

## 2017-05-09 ENCOUNTER — Encounter (HOSPITAL_COMMUNITY): Payer: Self-pay | Admitting: Nurse Practitioner

## 2017-05-09 ENCOUNTER — Ambulatory Visit (HOSPITAL_COMMUNITY): Payer: 59 | Admitting: Nurse Practitioner

## 2017-05-09 ENCOUNTER — Ambulatory Visit (HOSPITAL_COMMUNITY)
Admission: RE | Admit: 2017-05-09 | Discharge: 2017-05-09 | Disposition: A | Discharge status: Home / Self Care | Payer: 59 | Source: Ambulatory Visit | Attending: Nurse Practitioner | Admitting: Nurse Practitioner

## 2017-05-09 VITALS — BP 144/82 | HR 73 | Ht 66.0 in | Wt 234.0 lb

## 2017-05-09 DIAGNOSIS — Z9889 Other specified postprocedural states: Secondary | ICD-10-CM | POA: Insufficient documentation

## 2017-05-09 DIAGNOSIS — E039 Hypothyroidism, unspecified: Secondary | ICD-10-CM | POA: Diagnosis not present

## 2017-05-09 DIAGNOSIS — I4891 Unspecified atrial fibrillation: Secondary | ICD-10-CM | POA: Insufficient documentation

## 2017-05-09 DIAGNOSIS — Z882 Allergy status to sulfonamides status: Secondary | ICD-10-CM | POA: Diagnosis not present

## 2017-05-09 DIAGNOSIS — Z85820 Personal history of malignant melanoma of skin: Secondary | ICD-10-CM | POA: Insufficient documentation

## 2017-05-09 DIAGNOSIS — Z7901 Long term (current) use of anticoagulants: Secondary | ICD-10-CM | POA: Diagnosis not present

## 2017-05-09 DIAGNOSIS — Z9851 Tubal ligation status: Secondary | ICD-10-CM | POA: Diagnosis not present

## 2017-05-09 DIAGNOSIS — Z79891 Long term (current) use of opiate analgesic: Secondary | ICD-10-CM | POA: Insufficient documentation

## 2017-05-09 DIAGNOSIS — Z8249 Family history of ischemic heart disease and other diseases of the circulatory system: Secondary | ICD-10-CM | POA: Diagnosis not present

## 2017-05-09 DIAGNOSIS — I10 Essential (primary) hypertension: Secondary | ICD-10-CM | POA: Diagnosis not present

## 2017-05-09 DIAGNOSIS — Z79899 Other long term (current) drug therapy: Secondary | ICD-10-CM | POA: Diagnosis not present

## 2017-05-09 DIAGNOSIS — Z833 Family history of diabetes mellitus: Secondary | ICD-10-CM | POA: Insufficient documentation

## 2017-05-09 DIAGNOSIS — I480 Paroxysmal atrial fibrillation: Secondary | ICD-10-CM

## 2017-05-09 NOTE — Progress Notes (Signed)
Primary Care Physician: Unk Pinto, MD Referring Physician: Dr. Chelsea Primus Ayo is a 57 y.o. female with a h/o atrial flutter ablation and ablation for afib x 2, last one 04/11/17. She has not noted any afib. She continues on flecainide. No swallowing or groin issues. Continues on eliquis.  Today, she denies symptoms of palpitations, chest pain, shortness of breath, orthopnea, PND, lower extremity edema, dizziness, presyncope, syncope, or neurologic sequela. The patient is tolerating medications without difficulties and is otherwise without complaint today.   Past Medical History:  Diagnosis Date  . Arthritis    "probably in my knees; maybe in my hands" (04/11/2017)  . Asthma attack 10/2015 X 1  . Atrial flutter ([place]) 10/2015  . Heart murmur    "comes and goes" (04/11/2017)  . Hepatitis 1972   "don't know what kind"  . High cholesterol   . Hypertension   . Hypothyroidism   . Malignant melanoma of leg ([place])    "right thigh"  . PAF (paroxysmal atrial fibrillation) ([place])   . Pneumonia 1966; 1967   "left lung collapsed one of these times"  . Psoriasis   . Rosacea   . Vitamin D deficiency    Past Surgical History:  Procedure Laterality Date  . A-FLUTTER ABLATION N/A 02/08/2017   Procedure: A-Flutter Ablation;  Surgeon: Constance Haw, MD;  Location: [facility];  Service: Cardiovascular;  Laterality: N/A;  . ATRIAL FIBRILLATION ABLATION N/A 04/11/2017   Procedure: ATRIAL FIBRILLATION ABLATION;  Surgeon: Constance Haw, MD;  Location: [facility];  Service: Cardiovascular;  Laterality: N/A;  . CARDIOVERSION N/A 10/25/2015   Procedure: CARDIOVERSION;  Surgeon: Sueanne Margarita, MD;  Location: [facility];  Service: Cardiovascular;  Laterality: N/A;  . [place]  . CESAREAN SECTION WITH BILATERAL TUBAL LIGATION  1982  . ELECTROPHYSIOLOGIC STUDY N/A 01/13/2016   Procedure: Atrial Fibrillation Ablation;  Surgeon: Will Meredith Leeds, MD;  Location: [facility];  Service: Cardiovascular;  Laterality: N/A;  . ENDOMETRIAL ABLATION  ~ 2012  . MELANOMA EXCISION Right 10/1975   outer thigh "malignant"  . REFRACTIVE SURGERY Bilateral 1990s  . TEE WITHOUT CARDIOVERSION N/A 10/25/2015   Procedure: TRANSESOPHAGEAL ECHOCARDIOGRAM (TEE);  Surgeon: Sueanne Margarita, MD;  Location: [facility] ENDOSCOPY;  Service: Cardiovascular;  Laterality: N/A;  . TEE WITHOUT CARDIOVERSION N/A 01/11/2016   Procedure: TRANSESOPHAGEAL ECHOCARDIOGRAM (TEE);  Surgeon: Josue Hector, MD;  Location: [facility];  Service: Cardiovascular;  Laterality: N/A;  . TONSILLECTOMY  1967  . TUBAL LIGATION      Current Outpatient Medications  Medication Sig Dispense Refill  . augmented betamethasone dipropionate (DIPROLENE-AF) 0.05 % cream Apply 1 application topically daily as needed (for psoriasis).     . Calcium 600-400 MG-UNIT CHEW Chew 1 each by mouth 2 (two) times daily.     . carvedilol (COREG) 6.25 MG tablet TAKE 1 TABLET (6.25 MG TOTAL) BY MOUTH 2 (TWO) TIMES DAILY WITH A MEAL. 180 tablet 3  . Cholecalciferol (VITAMIN D PO) Take 10,000 Units by mouth daily.     . Coenzyme Q10 (CO Q10) 100 MG CAPS Take 100 mg by mouth daily.     Marland Kitchen ELIQUIS 5 MG TABS tablet TAKE 1 TABLET (5 MG TOTAL) BY MOUTH 2 (TWO) TIMES DAILY. 60 tablet 5  . flecainide (TAMBOCOR) 100 MG tablet Take 1 tablet (100 mg total) by mouth 2 (two) times daily. 60 tablet 6  . furosemide (LASIX) 20 MG tablet  Take 1 tablet (20 mg total) by mouth as needed (take 1 tablet daily as needed for 3-5lb weight gain, increased swelling). 30 tablet 2  . ibuprofen (ADVIL,MOTRIN) 200 MG tablet Take 600 mg by mouth every 6 (six) hours as needed for headache or moderate pain.    Marland Kitchen levothyroxine (SYNTHROID, LEVOTHROID) 50 MCG tablet TAKE 1 TABLET (50 MCG TOTAL) BY MOUTH DAILY. 90 tablet 1  . loratadine (CLARITIN) 10 MG tablet TAKE 10 MG BY MOUTH DAILY AS NEEDED FOR ALLERGY RELIEF  0  . losartan (COZAAR) 25 MG  tablet Take 1 tablet (25 mg total) by mouth daily. 30 tablet 3  . Magnesium 500 MG TABS Take 500 mg by mouth every evening.     . montelukast (SINGULAIR) 10 MG tablet TAKE 1 TABLET BY MOUTH EVERY DAY (Patient taking differently: TAKE 10 MG BY MOUTH EVERY DAY) 90 tablet 1  . Multiple Vitamin (MULTIVITAMIN WITH MINERALS) TABS tablet Take 1 tablet by mouth daily.    . NONFORMULARY OR COMPOUNDED ITEM Place 1 each vaginally See admin instructions. Estriol/testosterone 0.25-0.25 vaginal suppository compound. Insert vaginally on Sunday, Monday and Wednesday    . NONFORMULARY OR COMPOUNDED ITEM Place 1 application onto the skin See admin instructions. Biest 8/2 HRT compound  Apply topically every day except Saturday.    . NONFORMULARY OR COMPOUNDED ITEM Apply 1 each topically See admin instructions. Progesterone 8%/47ml Apply 0.11mls topically every day except Saturday.    Vladimir Faster Glycol-Propyl Glycol (SYSTANE OP) Place 1 drop into both eyes daily.    . Probiotic Product (PROBIOTIC DAILY PO) Take 1 tablet by mouth daily.    . traMADol (ULTRAM) 50 MG tablet Take 1 tablet qid PRN for pain. 30 tablet 0  . Turmeric 500 MG CAPS Take 500 mg by mouth 2 (two) times daily.     No current facility-administered medications for this encounter.     Allergies  Allergen Reactions  . Lipitor [Atorvastatin] Other (See Comments)    "neck pain"  . Metoprolol Other (See Comments)    DIDN'T WORK FOR PATIENT AND GAINED WEIGHT MIGHT HAVE BEEN TAKEN WITH RYTHMOL  . Rythmol [Propafenone] Other (See Comments)    "weight gain"  . Sulfa Drugs Cross Reactors Other (See Comments)    "soreness all over"    Social History   Socioeconomic History  . Marital status: Divorced    Spouse name: Not on file  . Number of children: Not on file  . Years of education: Not on file  . Highest education level: Not on file  Social Needs  . Financial resource strain: Not on file  . Food insecurity - worry: Not on file  . Food  insecurity - inability: Not on file  . Transportation needs - medical: Not on file  . Transportation needs - non-medical: Not on file  Occupational History  . Not on file  Tobacco Use  . Smoking status: Never Smoker  . Smokeless tobacco: Never Used  Substance and Sexual Activity  . Alcohol use: Yes    Comment: 04/11/2017 "q couple months I'll have a couple drinks "  . Drug use: No  . Sexual activity: Not Currently  Other Topics Concern  . Not on file  Social History Narrative  . Not on file    Family History  Problem Relation Age of Onset  . Heart disease Mother   . Hypertension Mother   . Heart attack Mother   . Heart disease Father   . Diabetes Father   .  Hypertension Father   . Heart attack Brother   . Colon cancer Neg Hx   . Stroke Neg Hx     ROS- All systems are reviewed and negative except as per the HPI above  Physical Exam: Vitals:   05/09/17 0852  BP: (!) 144/82  Pulse: 73  Weight: 234 lb (106.1 kg)  Height: 5\' 6"  (1.676 m)   Wt Readings from Last 3 Encounters:  05/09/17 234 lb (106.1 kg)  04/12/17 232 lb 4.8 oz (105.4 kg)  04/04/17 232 lb (105.2 kg)    Labs: Lab Results  Component Value Date   NA 141 04/04/2017   K 4.7 04/04/2017   CL 98 04/04/2017   CO2 25 04/04/2017   GLUCOSE 76 04/04/2017   BUN 18 04/04/2017   CREATININE 1.29 (H) 04/04/2017   CALCIUM 9.7 04/04/2017   MG 2.4 08/16/2016   Lab Results  Component Value Date   INR 1.0 12/05/2016   Lab Results  Component Value Date   CHOL 218 (H) 08/16/2016   HDL 38 (L) 08/16/2016   LDLCALC 134 (H) 08/16/2016   TRIG 230 (H) 08/16/2016     GEN- The patient is well appearing, alert and oriented x 3 today.   Head- normocephalic, atraumatic Eyes-  Sclera clear, conjunctiva pink Ears- hearing intact Oropharynx- clear Neck- supple, no JVP Lymph- no cervical lymphadenopathy Lungs- Clear to ausculation bilaterally, normal work of breathing Heart- Regular rate and rhythm, no murmurs,  rubs or gallops, PMI not laterally displaced GI- soft, NT, ND, + BS Extremities- no clubbing, cyanosis, or edema MS- no significant deformity or atrophy Skin- no rash or lesion Psych- euthymic mood, full affect Neuro- strength and sensation are intact  EKG- NSR at 73 bpm, ILBBB, PR int 192 ms, qrs int 1124 ms, qtc 447 ms Epic records reviewed    Assessment and Plan: 1. Afib Recent second ablation one month ago Maintaining SR Continue flecainide Continue eliquis for chadsvasc score of at least 2, knows not to interrupt anticoagulation for full 3 month healing period  F/u with Dr. Curt Bears 3/29  Geroge Baseman. [facility], [facility] [address] [place], [place] [address] [phone]

## 2017-05-13 NOTE — Patient Instructions (Signed)

## 2017-05-13 NOTE — Progress Notes (Signed)
This very nice 57 y.o. DWF presents for 3 month follow up with Hypertension, Hyperlipidemia, Pre-Diabetes and Vitamin D Deficiency.      Patient is treated for HTN (2013)  & BP has been controlled at home. Today's BP is at goal - 106/64.  Patient has long hx/o pAfib (CHA2DS2VASC 2 ) with intolerance to rescue meds and had her 1st Ablation 12/2015 by Dr Curt Bears and just recently completed her 3rd Ablation on 12/278/2018 by Dr Rayann Heman and she reports no palpitations since. She is maintained on Eliquis. Patient has had no complaints of any cardiac type chest pain, dyspnea / orthopnea / PND, dizziness, claudication, or dependent edema.     Hyperlipidemia is controlled with diet & meds. Patient denies myalgias or other med SE's. Last Lipids were not at goal on diet and she is reticent to take Statins for hx/o intolerance to Lipitor.  Lab Results  Component Value Date   CHOL 218 (H) 08/16/2016   HDL 38 (L) 08/16/2016   LDLCALC 134 (H) 08/16/2016   TRIG 230 (H) 08/16/2016   CHOLHDL 5.7 (H) 08/16/2016      Also, the patient has history of Morbid Obesity with BMI now risen to 38+ and she's monitored expectantly for PreDiabetes. She denies symptoms of reactive hypoglycemia, diabetic polys, paresthesias or visual blurring.  Last A1c was Normal & at goal: Lab Results  Component Value Date   HGBA1C 5.3 08/16/2016      Patient has been on Thyroid Replacement since Nov 2014. Further, the patient also has history of Vitamin D Deficiency and supplements vitamin D without any suspected side-effects. Last vitamin D was at goal: Lab Results  Component Value Date   VD25OH 84 08/16/2016   Current Outpatient Medications on File Prior to Visit  Medication Sig  . augmented betamethasone dipropionate (DIPROLENE-AF) 0.05 % cream Apply 1 application topically daily as needed (for psoriasis).   . Calcium 600-400 MG-UNIT CHEW Chew 1 each by mouth 2 (two) times daily.   . carvedilol (COREG) 6.25 MG tablet TAKE 1  TABLET (6.25 MG TOTAL) BY MOUTH 2 (TWO) TIMES DAILY WITH A MEAL.  Marland Kitchen Cholecalciferol (VITAMIN D PO) Take 10,000 Units by mouth daily.   . Coenzyme Q10 (CO Q10) 100 MG CAPS Take 100 mg by mouth daily.   Marland Kitchen ELIQUIS 5 MG TABS tablet TAKE 1 TABLET (5 MG TOTAL) BY MOUTH 2 (TWO) TIMES DAILY.  . flecainide (TAMBOCOR) 100 MG tablet Take 1 tablet (100 mg total) by mouth 2 (two) times daily.  . furosemide (LASIX) 20 MG tablet Take 1 tablet (20 mg total) by mouth as needed (take 1 tablet daily as needed for 3-5lb weight gain, increased swelling).  Marland Kitchen levothyroxine (SYNTHROID, LEVOTHROID) 50 MCG tablet TAKE 1 TABLET (50 MCG TOTAL) BY MOUTH DAILY.  Marland Kitchen loratadine (CLARITIN) 10 MG tablet TAKE 10 MG BY MOUTH DAILY AS NEEDED FOR ALLERGY RELIEF  . losartan (COZAAR) 25 MG tablet Take 1 tablet (25 mg total) by mouth daily.  . Magnesium 500 MG TABS Take 500 mg by mouth every evening.   . montelukast (SINGULAIR) 10 MG tablet TAKE 1 TABLET BY MOUTH EVERY DAY (Patient taking differently: TAKE 10 MG BY MOUTH EVERY DAY)  . Multiple Vitamin (MULTIVITAMIN WITH MINERALS) TABS tablet Take 1 tablet by mouth daily.  . NONFORMULARY OR COMPOUNDED ITEM Place 1 each vaginally See admin instructions. Estriol/testosterone 0.25-0.25 vaginal suppository compound. Insert vaginally on Sunday, Monday and Wednesday  . NONFORMULARY OR COMPOUNDED ITEM Place  1 application onto the skin See admin instructions. Biest 8/2 HRT compound  Apply topically every day except Saturday.  . NONFORMULARY OR COMPOUNDED ITEM Apply 1 each topically See admin instructions. Progesterone 8%/2ml Apply 0.51mls topically every day except Saturday.  Vladimir Faster Glycol-Propyl Glycol (SYSTANE OP) Place 1 drop into both eyes daily.  . Probiotic Product (PROBIOTIC DAILY PO) Take 1 tablet by mouth daily.  . traMADol (ULTRAM) 50 MG tablet Take 1 tablet qid PRN for pain.  . Turmeric 500 MG CAPS Take 500 mg by mouth 2 (two) times daily.   No current facility-administered  medications on file prior to visit.    Allergies  Allergen Reactions  . Lipitor [Atorvastatin] Other (See Comments)    "neck pain"  . Metoprolol Other (See Comments)    DIDN'T WORK FOR PATIENT AND GAINED WEIGHT MIGHT HAVE BEEN TAKEN WITH RYTHMOL  . Rythmol [Propafenone] Other (See Comments)    "weight gain"  . Sulfa Drugs Cross Reactors Other (See Comments)    "soreness all over"   PMHx:   Past Medical History:  Diagnosis Date  . Arthritis    "probably in my knees; maybe in my hands" (04/11/2017)  . Asthma attack 10/2015 X 1  . Atrial flutter ([place]) 10/2015  . Heart murmur    "comes and goes" (04/11/2017)  . Hepatitis 1972   "don't know what kind"  . High cholesterol   . Hypertension   . Hypothyroidism   . Malignant melanoma of leg ([place])    "right thigh"  . PAF (paroxysmal atrial fibrillation) ([place])   . Pneumonia 1966; 1967   "left lung collapsed one of these times"  . Psoriasis   . Rosacea   . Vitamin D deficiency    Immunization History  Administered Date(s) Administered  . PPD Test 12/07/2013, 12/22/2014, 08/17/2016  . Tdap 06/23/2015   Past Surgical History:  Procedure Laterality Date  . A-FLUTTER ABLATION N/A 02/08/2017   Procedure: A-Flutter Ablation;  Surgeon: Constance Haw, MD;  Location: [facility];  Service: Cardiovascular;  Laterality: N/A;  . ATRIAL FIBRILLATION ABLATION N/A 04/11/2017   Procedure: ATRIAL FIBRILLATION ABLATION;  Surgeon: Constance Haw, MD;  Location: [facility];  Service: Cardiovascular;  Laterality: N/A;  . CARDIOVERSION N/A 10/25/2015   Procedure: CARDIOVERSION;  Surgeon: Sueanne Margarita, MD;  Location: [place];  Service: Cardiovascular;  Laterality: N/A;  . [place]  . CESAREAN SECTION WITH BILATERAL TUBAL LIGATION  1982  . ELECTROPHYSIOLOGIC STUDY N/A 01/13/2016   Procedure: Atrial Fibrillation Ablation;  Surgeon: Will Meredith Leeds, MD;  Location: [facility];  Service:  Cardiovascular;  Laterality: N/A;  . ENDOMETRIAL ABLATION  ~ 2012  . MELANOMA EXCISION Right 10/1975   outer thigh "malignant"  . REFRACTIVE SURGERY Bilateral 1990s  . TEE WITHOUT CARDIOVERSION N/A 10/25/2015   Procedure: TRANSESOPHAGEAL ECHOCARDIOGRAM (TEE);  Surgeon: Sueanne Margarita, MD;  Location: [facility] ENDOSCOPY;  Service: Cardiovascular;  Laterality: N/A;  . TEE WITHOUT CARDIOVERSION N/A 01/11/2016   Procedure: TRANSESOPHAGEAL ECHOCARDIOGRAM (TEE);  Surgeon: Josue Hector, MD;  Location: [facility];  Service: Cardiovascular;  Laterality: N/A;  . TONSILLECTOMY  1967  . TUBAL LIGATION     FHx:    Reviewed / unchanged  SHx:    Reviewed / unchanged   Systems Review:  Constitutional: Denies fever, chills, wt changes, headaches, insomnia, fatigue, night sweats, change in appetite. Eyes: Denies redness, blurred vision, diplopia, discharge, itchy, watery eyes.  ENT: Denies  discharge, congestion, post nasal drip, epistaxis, sore throat, earache, hearing loss, dental pain, tinnitus, vertigo, sinus pain, snoring.  CV: Denies chest pain, palpitations, irregular heartbeat, syncope, dyspnea, diaphoresis, orthopnea, PND, claudication or edema. Respiratory: denies cough, dyspnea, DOE, pleurisy, hoarseness, laryngitis, wheezing.  Gastrointestinal: Denies dysphagia, odynophagia, heartburn, reflux, water brash, abdominal pain or cramps, nausea, vomiting, bloating, diarrhea, constipation, hematemesis, melena, hematochezia  or hemorrhoids. Genitourinary: Denies dysuria, frequency, urgency, nocturia, hesitancy, discharge, hematuria or flank pain. Musculoskeletal: Denies arthralgias, myalgias, stiffness, jt. swelling, pain, limping or strain/sprain.  Skin: Denies pruritus, rash, hives, warts, acne, eczema or change in skin lesion(s). Neuro: No weakness, tremor, incoordination, spasms, paresthesia or pain. Psychiatric: Denies confusion, memory loss or sensory loss. Endo: Denies change in weight, skin or hair  change.  Heme/Lymph: No excessive bleeding, bruising or enlarged lymph nodes.  Physical Exam  BP 106/64   Pulse 68   Temp 97.8 F (36.6 C)   Resp 16   Ht 5' 5.75" (1.67 m)   Wt 234 lb 12.8 oz (106.5 kg)   LMP 10/29/2013 (Approximate)   BMI 38.19 kg/m   Appears well nourished, well groomed  and in no distress.  Eyes: PERRLA, EOMs, conjunctiva no swelling or erythema. Sinuses: No frontal/maxillary tenderness ENT/Mouth: EAC's clear, TM's nl w/o erythema, bulging. Nares clear w/o erythema, swelling, exudates. Oropharynx clear without erythema or exudates. Oral hygiene is good. Tongue normal, non obstructing. Hearing intact.  Neck: Supple. Thyroid nl. Car 2+/2+ without bruits, nodes or JVD. Chest: Respirations nl with BS clear & equal w/o rales, rhonchi, wheezing or stridor.  Cor: Heart sounds normal w/ regular rate and rhythm without sig. murmurs, gallops, clicks or rubs. Peripheral pulses normal and equal  without edema.  Abdomen: Soft & bowel sounds normal. Non-tender w/o guarding, rebound, hernias, masses or organomegaly.  Lymphatics: Unremarkable.  Musculoskeletal: Full ROM all peripheral extremities, joint stability, 5/5 strength and normal gait.  Skin: Warm, dry without exposed rashes, lesions or ecchymosis apparent.  Neuro: Cranial nerves intact, reflexes equal bilaterally. Sensory-motor testing grossly intact. Tendon reflexes grossly intact.  Pysch: Alert & oriented x 3.  Insight and judgement nl & appropriate. No ideations.  Assessment and Plan:  1. Essential hypertension  - Continue medication, monitor blood pressure at home.  - Continue DASH diet. Reminder to go to the ER if any CP,  SOB, nausea, dizziness, severe HA, changes vision/speech.  - CBC with Differential/Platelet - BASIC METABOLIC PANEL WITH GFR - Magnesium - TSH  2. Hyperlipidemia, mixed  - Continue diet/meds, exercise,& lifestyle modifications.  - Continue monitor periodic cholesterol/liver & renal  functions   - Hepatic function panel - Lipid panel - TSH  3. Abnormal glucose  - Continue diet, exercise, lifestyle modifications.  - Monitor appropriate labs.  - Hemoglobin A1c - Insulin, random  4. Vitamin D deficiency  - Continue supplementation. - VITAMIN D 25 Hydroxy  5. Hypothyroidism  - Hemoglobin A1c - Insulin, random  6. Medication management  - CBC with Differential/Platelet - BASIC METABOLIC PANEL WITH GFR - Hepatic function panel - Magnesium - Lipid panel - TSH - Hemoglobin A1c - Insulin, random - VITAMIN D 25 Hydroxy        Discussed  regular exercise, BP monitoring, weight control to achieve/maintain BMI less than 25 and discussed med and SE's. Recommended labs to assess and monitor clinical status with further disposition pending results of labs. Over 30 minutes of exam, counseling, chart review was performed.

## 2017-05-14 ENCOUNTER — Encounter: Payer: Self-pay | Admitting: Internal Medicine

## 2017-05-14 ENCOUNTER — Ambulatory Visit: Payer: 59 | Admitting: Internal Medicine

## 2017-05-14 VITALS — BP 106/64 | HR 68 | Temp 97.8°F | Resp 16 | Ht 65.75 in | Wt 234.8 lb

## 2017-05-14 DIAGNOSIS — E039 Hypothyroidism, unspecified: Secondary | ICD-10-CM | POA: Diagnosis not present

## 2017-05-14 DIAGNOSIS — E559 Vitamin D deficiency, unspecified: Secondary | ICD-10-CM

## 2017-05-14 DIAGNOSIS — I10 Essential (primary) hypertension: Secondary | ICD-10-CM | POA: Diagnosis not present

## 2017-05-14 DIAGNOSIS — Z79899 Other long term (current) drug therapy: Secondary | ICD-10-CM | POA: Diagnosis not present

## 2017-05-14 DIAGNOSIS — R7309 Other abnormal glucose: Secondary | ICD-10-CM | POA: Diagnosis not present

## 2017-05-14 DIAGNOSIS — E782 Mixed hyperlipidemia: Secondary | ICD-10-CM

## 2017-05-15 ENCOUNTER — Other Ambulatory Visit: Payer: Self-pay | Admitting: Internal Medicine

## 2017-05-15 DIAGNOSIS — E782 Mixed hyperlipidemia: Secondary | ICD-10-CM

## 2017-05-15 LAB — CBC WITH DIFFERENTIAL/PLATELET
Basophils Absolute: 57 cells/uL (ref 0–200)
Basophils Relative: 0.5 %
Eosinophils Absolute: 205 cells/uL (ref 15–500)
Eosinophils Relative: 1.8 %
HCT: 39 % (ref 35.0–45.0)
Hemoglobin: 13.2 g/dL (ref 11.7–15.5)
Lymphs Abs: 3397 cells/uL (ref 850–3900)
MCH: 28.7 pg (ref 27.0–33.0)
MCHC: 33.8 g/dL (ref 32.0–36.0)
MCV: 84.8 fL (ref 80.0–100.0)
MPV: 10.1 fL (ref 7.5–12.5)
Monocytes Relative: 6.7 %
Neutro Abs: 6977 cells/uL (ref 1500–7800)
Neutrophils Relative %: 61.2 %
Platelets: 299 10*3/uL (ref 140–400)
RBC: 4.6 10*6/uL (ref 3.80–5.10)
RDW: 13 % (ref 11.0–15.0)
Total Lymphocyte: 29.8 %
WBC mixed population: 764 cells/uL (ref 200–950)
WBC: 11.4 10*3/uL — ABNORMAL HIGH (ref 3.8–10.8)

## 2017-05-15 LAB — HEPATIC FUNCTION PANEL
AG Ratio: 1.7 (calc) (ref 1.0–2.5)
ALT: 11 U/L (ref 6–29)
AST: 12 U/L (ref 10–35)
Albumin: 4.5 g/dL (ref 3.6–5.1)
Alkaline phosphatase (APISO): 57 U/L (ref 33–130)
Bilirubin, Direct: 0.1 mg/dL (ref 0.0–0.2)
Globulin: 2.7 g/dL (calc) (ref 1.9–3.7)
Indirect Bilirubin: 0.4 mg/dL (calc) (ref 0.2–1.2)
Total Bilirubin: 0.5 mg/dL (ref 0.2–1.2)
Total Protein: 7.2 g/dL (ref 6.1–8.1)

## 2017-05-15 LAB — LIPID PANEL
Cholesterol: 241 mg/dL — ABNORMAL HIGH (ref <200)
HDL: 43 mg/dL — ABNORMAL LOW (ref >50)
LDL Cholesterol (Calc): 155 mg/dL (calc) — ABNORMAL HIGH
Non-HDL Cholesterol (Calc): 198 mg/dL (calc) — ABNORMAL HIGH (ref <130)
Total CHOL/HDL Ratio: 5.6 (calc) — ABNORMAL HIGH (ref <5.0)
Triglycerides: 261 mg/dL — ABNORMAL HIGH (ref <150)

## 2017-05-15 LAB — BASIC METABOLIC PANEL WITH GFR
BUN: 23 mg/dL (ref 7–25)
CO2: 28 mmol/L (ref 20–32)
Calcium: 9.7 mg/dL (ref 8.6–10.4)
Chloride: 102 mmol/L (ref 98–110)
Creat: 0.98 mg/dL (ref 0.50–1.05)
GFR, Est African American: 74 mL/min/{1.73_m2} (ref >=60)
GFR, Est Non African American: 64 mL/min/{1.73_m2} (ref >=60)
Glucose, Bld: 90 mg/dL (ref 65–99)
Potassium: 4.2 mmol/L (ref 3.5–5.3)
Sodium: 139 mmol/L (ref 135–146)

## 2017-05-15 LAB — VITAMIN D 25 HYDROXY (VIT D DEFICIENCY, FRACTURES): Vit D, 25-Hydroxy: 60 ng/mL (ref 30–100)

## 2017-05-15 LAB — HEMOGLOBIN A1C
Hgb A1c MFr Bld: 5.5 % of total Hgb (ref <5.7)
Mean Plasma Glucose: 111 (calc)
eAG (mmol/L): 6.2 (calc)

## 2017-05-15 LAB — MAGNESIUM: Magnesium: 2.5 mg/dL (ref 1.5–2.5)

## 2017-05-15 LAB — TSH: TSH: 1.85 mIU/L (ref 0.40–4.50)

## 2017-05-15 LAB — INSULIN, RANDOM: Insulin: 21.7 u[IU]/mL — ABNORMAL HIGH (ref 2.0–19.6)

## 2017-05-15 MED ORDER — ROSUVASTATIN CALCIUM 20 MG PO TABS: ORAL_TABLET | ORAL | 11 refills | Status: DC

## 2017-05-21 ENCOUNTER — Other Ambulatory Visit: Payer: Self-pay | Admitting: Internal Medicine

## 2017-06-01 ENCOUNTER — Other Ambulatory Visit: Payer: Self-pay | Admitting: Cardiology

## 2017-06-05 ENCOUNTER — Other Ambulatory Visit: Payer: Self-pay | Admitting: Internal Medicine

## 2017-06-06 ENCOUNTER — Encounter: Payer: Self-pay | Admitting: Internal Medicine

## 2017-06-24 ENCOUNTER — Other Ambulatory Visit: Payer: Self-pay | Admitting: Internal Medicine

## 2017-06-28 ENCOUNTER — Encounter (INDEPENDENT_AMBULATORY_CARE_PROVIDER_SITE_OTHER): Payer: Self-pay | Admitting: Orthopaedic Surgery

## 2017-06-28 ENCOUNTER — Ambulatory Visit (INDEPENDENT_AMBULATORY_CARE_PROVIDER_SITE_OTHER): Payer: 59 | Admitting: Orthopaedic Surgery

## 2017-06-28 ENCOUNTER — Ambulatory Visit (INDEPENDENT_AMBULATORY_CARE_PROVIDER_SITE_OTHER): Payer: Self-pay

## 2017-06-28 ENCOUNTER — Ambulatory Visit (INDEPENDENT_AMBULATORY_CARE_PROVIDER_SITE_OTHER): Payer: 59

## 2017-06-28 VITALS — BP 141/83 | HR 68 | Resp 18 | Ht 65.0 in | Wt 230.0 lb

## 2017-06-28 DIAGNOSIS — M25551 Pain in right hip: Secondary | ICD-10-CM | POA: Diagnosis not present

## 2017-06-28 MED ORDER — BUPIVACAINE HCL 0.5 % IJ SOLN
2.0000 mL | INTRAMUSCULAR | Status: AC | PRN
  Administered 2017-06-28: 2 mL via INTRA_ARTICULAR

## 2017-06-28 MED ORDER — LIDOCAINE HCL 1 % IJ SOLN
2.0000 mL | INTRAMUSCULAR | Status: AC | PRN
  Administered 2017-06-28: 2 mL

## 2017-06-28 MED ORDER — METHYLPREDNISOLONE ACETATE 40 MG/ML IJ SUSP
80.0000 mg | INTRAMUSCULAR | Status: AC | PRN
  Administered 2017-06-28: 80 mg

## 2017-06-28 NOTE — Progress Notes (Signed)
Office Visit Note   Patient: Monica Morgan           Date of Birth: Nov 04, 1960           MRN: [id number] Visit Date: 06/28/2017              Requested by: Unk Pinto, [address] [place], [place] [address] PCP: Unk Pinto, MD   Assessment & Plan: Visit Diagnoses:  1. Pain of right hip joint     Plan: Right hip pain could be localized greater trochanteric issue, pain referred from mild arthritis of her right hip or pain referred from degenerative changes at L4-5 and L5-S1.  Discussion regarding potential etiologies and review of x-rays.  Try a localized cortisone injection over the greater trochanter and monitor response.  Follow-Up Instructions: No Follow-up on file.   Orders:  Orders Placed This Encounter  Procedures  . XR Lumbar Spine 2-3 Views  . XR Pelvis 1-2 Views   No orders of the defined types were placed in this encounter.     Procedures: Large Joint Inj: R glenohumeral on 06/28/2017 9:15 AM Indications: pain and diagnostic evaluation Details: 25 G 1.5 in and 3.5 in needle, posterior approach  Arthrogram: No  Medications: 2 mL lidocaine 1 %; 2 mL bupivacaine 0.5 %; 80 mg methylPREDNISolone acetate 40 MG/ML Outcome: tolerated well, no immediate complications Consent was given by the patient. Immediately prior to procedure a time out was called to verify the correct patient, procedure, equipment, support staff and site/side marked as required. Patient was prepped and draped in the usual sterile fashion.       Clinical Data: No additional findings.   Subjective: Chief Complaint  Patient presents with  . Right Hip - Pain    MRS Westall IS 57 YO F HERE FOR RIGH HIP PAIN, WAS A RUNNER AND TRACK WHILE IN COLLEGE, HURTS STARTING TO STAND AND WHEN STORMS COME IN ITS VERY HARD TO WALK.  At least a 71-month history of insidious onset right hip pain.  Pain is localized laterally without any referred pain proximally or distally.   Denies low back pain.  No groin pain.  No numbness or tingling.  Does have a history of psoriasis.  So history of heart failure.  Considerable difficulty when she first gets up from a sitting position with the above pain that seems to improve as she "moves".  No history of recent fever chills or injury  HPI  Review of Systems  Constitutional: Negative for fatigue and fever.  HENT: Negative for ear pain.   Eyes: Negative for pain.  Respiratory: Positive for cough. Negative for shortness of breath.   Cardiovascular: Positive for leg swelling.  Gastrointestinal: Negative for blood in stool, constipation and diarrhea.  Genitourinary: Negative for dysuria.  Musculoskeletal: Negative for back pain and neck pain.  Skin: Negative for rash and wound.  Allergic/Immunologic: Negative for food allergies.  Neurological: Negative for dizziness, weakness, light-headedness, numbness and headaches.  Hematological: Bruises/bleeds easily.  Psychiatric/Behavioral: Positive for sleep disturbance.     Objective: Vital Signs: BP (!) 141/83 (BP Location: Right Arm, Patient Position: Sitting, Cuff Size: Normal)   Pulse 68   Resp 18   Ht 5\' 5"  (1.651 m)   Wt 230 lb (104.3 kg)   LMP 10/29/2013 (Approximate)   BMI 38.27 kg/m   Physical Exam  Ortho Exam awake alert and oriented x3.  A little bit of a limp when she first gets up from a sitting  position.  Painless range of motion of right hip with internal/external rotation flexion and extension.  Multiple areas of tenderness over the area of the greater trochanter right hip. skin intact.  Straight leg raise negative.  No percussible tenderness of the lumbar spine  Specialty Comments:  No specialty comments available.  Imaging: No results found.   PMFS History: Patient Active Problem List   Diagnosis Date Noted  . AF (atrial fibrillation) ([place]) 04/11/2017  . Persistent atrial fibrillation ([place])   . Leukocytosis 10/22/2015  . Atrial flutter with  rapid ventricular response ([place]) 10/20/2015  . Obesity 12/25/2014  . Abnormal glucose 06/09/2014  . Medication management 06/04/2013  . Hypothyroidism 06/04/2013  . Vitamin D deficiency   . Mixed hyperlipidemia 12/20/2011   Past Medical History:  Diagnosis Date  . Arthritis    "probably in my knees; maybe in my hands" (04/11/2017)  . Asthma attack 10/2015 X 1  . Atrial flutter ([place]) 10/2015  . Heart murmur    "comes and goes" (04/11/2017)  . Hepatitis 1972   "don't know what kind"  . High cholesterol   . Hypertension   . Hypothyroidism   . Malignant melanoma of leg ([place])    "right thigh"  . PAF (paroxysmal atrial fibrillation) ([place])   . Pneumonia 1966; 1967   "left lung collapsed one of these times"  . Psoriasis   . Rosacea   . Vitamin D deficiency     Family History  Problem Relation Age of Onset  . Heart disease Mother   . Hypertension Mother   . Heart attack Mother   . Heart disease Father   . Diabetes Father   . Hypertension Father   . Heart attack Brother   . Colon cancer Neg Hx   . Stroke Neg Hx     Past Surgical History:  Procedure Laterality Date  . A-FLUTTER ABLATION N/A 02/08/2017   Procedure: A-Flutter Ablation;  Surgeon: Constance Haw, MD;  Location: [facility];  Service: Cardiovascular;  Laterality: N/A;  . ATRIAL FIBRILLATION ABLATION N/A 04/11/2017   Procedure: ATRIAL FIBRILLATION ABLATION;  Surgeon: Constance Haw, MD;  Location: [facility];  Service: Cardiovascular;  Laterality: N/A;  . CARDIOVERSION N/A 10/25/2015   Procedure: CARDIOVERSION;  Surgeon: Sueanne Margarita, MD;  Location: [facility];  Service: Cardiovascular;  Laterality: N/A;  . [place]  . CESAREAN SECTION WITH BILATERAL TUBAL LIGATION  1982  . ELECTROPHYSIOLOGIC STUDY N/A 01/13/2016   Procedure: Atrial Fibrillation Ablation;  Surgeon: Will Meredith Leeds, MD;  Location: [facility];  Service: Cardiovascular;  Laterality: N/A;  .  ENDOMETRIAL ABLATION  ~ 2012  . MELANOMA EXCISION Right 10/1975   outer thigh "malignant"  . REFRACTIVE SURGERY Bilateral 1990s  . TEE WITHOUT CARDIOVERSION N/A 10/25/2015   Procedure: TRANSESOPHAGEAL ECHOCARDIOGRAM (TEE);  Surgeon: Sueanne Margarita, MD;  Location[facility];  Service: Cardiovascular;  Laterality: N/A;  . TEE WITHOUT CARDIOVERSION N/A 01/11/2016   Procedure: TRANSESOPHAGEAL ECHOCARDIOGRAM (TEE);  Surgeon: Josue Hector, MD;  Location: [facility];  Service: Cardiovascular;  Laterality: N/A;  . TONSILLECTOMY  1967  . TUBAL LIGATION     Social History   Occupational History  . Not on file  Tobacco Use  . Smoking status: Never Smoker  . Smokeless tobacco: Never Used  Substance and Sexual Activity  . Alcohol use: Yes    Comment: 04/11/2017 "q couple months I'll have a couple drinks "  . Drug use:  No  . Sexual activity: Not Currently

## 2017-07-12 ENCOUNTER — Encounter: Payer: Self-pay | Admitting: Cardiology

## 2017-07-12 ENCOUNTER — Ambulatory Visit: Payer: 59 | Admitting: Cardiology

## 2017-07-12 VITALS — BP 134/78 | HR 64 | Ht 65.0 in | Wt 235.0 lb

## 2017-07-12 DIAGNOSIS — I484 Atypical atrial flutter: Secondary | ICD-10-CM | POA: Diagnosis not present

## 2017-07-12 DIAGNOSIS — I481 Persistent atrial fibrillation: Secondary | ICD-10-CM

## 2017-07-12 DIAGNOSIS — I4819 Other persistent atrial fibrillation: Secondary | ICD-10-CM

## 2017-07-12 DIAGNOSIS — I10 Essential (primary) hypertension: Secondary | ICD-10-CM | POA: Diagnosis not present

## 2017-07-12 DIAGNOSIS — G4733 Obstructive sleep apnea (adult) (pediatric): Secondary | ICD-10-CM

## 2017-07-12 DIAGNOSIS — I483 Typical atrial flutter: Secondary | ICD-10-CM

## 2017-07-12 MED ORDER — DILTIAZEM HCL ER COATED BEADS 180 MG PO CP24: 180.0000 mg | ORAL_CAPSULE | Freq: Every day | ORAL | 6 refills | Status: DC

## 2017-07-12 NOTE — Progress Notes (Signed)
Electrophysiology Office Note   Date:  07/12/2017   ID:  Monica Morgan, DOB [birth year], MRN [id number]  PCP:  Unk Pinto, MD  Cardiologist:  Radford Pax Primary Electrophysiologist:  Will Meredith Leeds, MD    Chief Complaint  Patient presents with  . Follow-up    Persistent Afib/Typical AFlutter     History of Present Illness: Monica Morgan is a 57 y.o. female who presents today for electrophysiology evaluation.   Recent admission for tikosyn loading.  Had TEE/CV which restored sinus rhythm.  TEE noted EF 35-40%.  Discharged in sinus rhythm.  Continuing to have AF symptoms. Had AF ablation 01/13/16. Taken off tikosyn 12/17. She is felt well since last being seen. She presented to clinic in atrial flutter. She was in and out of atrial flutter and thus no cardioversion was performed.  Patient for typical atrial flutter on 03/11/17.  During the procedure, patient also had left atrial arrhythmias.  She had a repeat ablation on 04/11/17 with extensive left atrial ablation for multiple atypical atrial flutter's.  Required cardioversion to return to sinus rhythm.  She was discharged on flecainide.  Today, denies symptoms of palpitations, chest pain, shortness of breath, orthopnea, PND, lower extremity edema, claudication, dizziness, presyncope, syncope, bleeding, or neurologic sequela. The patient is tolerating medications without difficulties.  Overall she is feeling well.  Her main complaint is fatigue, weakness, and right hip pain.  She does have arthritis in her hip and she is required multiple injections.  She is also having fatigue with up to a 35 pound weight gain.  She would like to lose this weight but is having difficulty.  She feels like it might be due to her medications.   Past Medical History:  Diagnosis Date  . Arthritis    "probably in my knees; maybe in my hands" (04/11/2017)  . Asthma attack 10/2015 X 1  . Atrial flutter ([place]) 10/2015  . Heart murmur    "comes and goes"  (04/11/2017)  . Hepatitis 1972   "don't know what kind"  . High cholesterol   . Hypertension   . Hypothyroidism   . Malignant melanoma of leg ([place])    "right thigh"  . PAF (paroxysmal atrial fibrillation) ([place])   . Pneumonia 1966; 1967   "left lung collapsed one of these times"  . Psoriasis   . Rosacea   . Vitamin D deficiency    Past Surgical History:  Procedure Laterality Date  . A-FLUTTER ABLATION N/A 02/08/2017   Procedure: A-Flutter Ablation;  Surgeon: Constance Haw, MD;  Location: [facility];  Service: Cardiovascular;  Laterality: N/A;  . ATRIAL FIBRILLATION ABLATION N/A 04/11/2017   Procedure: ATRIAL FIBRILLATION ABLATION;  Surgeon: Constance Haw, MD;  Location: [facility];  Service: Cardiovascular;  Laterality: N/A;  . CARDIOVERSION N/A 10/25/2015   Procedure: CARDIOVERSION;  Surgeon: Sueanne Margarita, MD;  Location: [facility];  Service: Cardiovascular;  Laterality: N/A;  . [place]  . CESAREAN SECTION WITH BILATERAL TUBAL LIGATION  1982  . ELECTROPHYSIOLOGIC STUDY N/A 01/13/2016   Procedure: Atrial Fibrillation Ablation;  Surgeon: Will Meredith Leeds, MD;  Location: [facility];  Service: Cardiovascular;  Laterality: N/A;  . ENDOMETRIAL ABLATION  ~ 2012  . MELANOMA EXCISION Right 10/1975   outer thigh "malignant"  . REFRACTIVE SURGERY Bilateral 1990s  . TEE WITHOUT CARDIOVERSION N/A 10/25/2015   Procedure: TRANSESOPHAGEAL ECHOCARDIOGRAM (TEE);  Surgeon: Sueanne Margarita, MD;  Location: [facility] [place] [facility];  Service: Cardiovascular;  Laterality: N/A;  . TEE WITHOUT CARDIOVERSION N/A 01/11/2016   Procedure: TRANSESOPHAGEAL ECHOCARDIOGRAM (TEE);  Surgeon: Josue Hector, MD;  Location: [facility];  Service: Cardiovascular;  Laterality: N/A;  . TONSILLECTOMY  1967  . TUBAL LIGATION       Current Outpatient Medications  Medication Sig Dispense Refill  . augmented betamethasone dipropionate (DIPROLENE-AF) 0.05 % cream Apply 1  application topically daily as needed (for psoriasis).     . Calcium 600-400 MG-UNIT CHEW Chew 1 each by mouth 2 (two) times daily.     . carvedilol (COREG) 6.25 MG tablet TAKE 1 TABLET (6.25 MG TOTAL) BY MOUTH 2 (TWO) TIMES DAILY WITH A MEAL. 180 tablet 3  . Cholecalciferol (VITAMIN D PO) Take 10,000 Units by mouth daily.     . Coenzyme Q10 (CO Q10) 100 MG CAPS Take 100 mg by mouth daily.     Marland Kitchen ELIQUIS 5 MG TABS tablet TAKE 1 TABLET BY MOUTH TWICE A DAY 60 tablet 5  . flecainide (TAMBOCOR) 100 MG tablet Take 1 tablet (100 mg total) by mouth 2 (two) times daily. 60 tablet 6  . furosemide (LASIX) 20 MG tablet Take 1 tablet (20 mg total) by mouth as needed (take 1 tablet daily as needed for 3-5lb weight gain, increased swelling). 30 tablet 2  . levothyroxine (SYNTHROID, LEVOTHROID) 50 MCG tablet TAKE 1 TABLET (50 MCG TOTAL) BY MOUTH DAILY. 90 tablet 1  . lisinopril (PRINIVIL,ZESTRIL) 5 MG tablet TAKE 1 TABLET (5 MG TOTAL) BY MOUTH DAILY. 90 tablet 1  . loratadine (CLARITIN) 10 MG tablet TAKE 10 MG BY MOUTH DAILY AS NEEDED FOR ALLERGY RELIEF  0  . losartan (COZAAR) 25 MG tablet Take 1 tablet (25 mg total) by mouth daily. 30 tablet 3  . Magnesium 500 MG TABS Take 500 mg by mouth every evening.     . montelukast (SINGULAIR) 10 MG tablet TAKE 1 TABLET BY MOUTH EVERY DAY (Patient taking differently: TAKE 10 MG BY MOUTH EVERY DAY) 90 tablet 1  . Multiple Vitamin (MULTIVITAMIN WITH MINERALS) TABS tablet Take 1 tablet by mouth daily.    . NONFORMULARY OR COMPOUNDED ITEM Place 1 each vaginally See admin instructions. Estriol/testosterone 0.25-0.25 vaginal suppository compound. Insert vaginally on Sunday, Monday and Wednesday    . NONFORMULARY OR COMPOUNDED ITEM Place 1 application onto the skin See admin instructions. Biest 8/2 HRT compound  Apply topically every day except Saturday.    . NONFORMULARY OR COMPOUNDED ITEM Apply 1 each topically See admin instructions. Progesterone 8%/14ml Apply 0.37mls  topically every day except Saturday.    Vladimir Faster Glycol-Propyl Glycol (SYSTANE OP) Place 1 drop into both eyes daily.    . Probiotic Product (PROBIOTIC DAILY PO) Take 1 tablet by mouth daily.    . rosuvastatin (CRESTOR) 20 MG tablet Take 1/2 to 1 tablet at bedtime or as directed to prevent heart attack / stroke 30 tablet 11  . traMADol (ULTRAM) 50 MG tablet TAKE 1 TABLET BY MOUTH 4 TIMES A DAY AS NEEDED FOR PAIN 20 tablet 0  . Turmeric 500 MG CAPS Take 500 mg by mouth 2 (two) times daily.     No current facility-administered medications for this visit.     Allergies:   Lipitor [atorvastatin]; Metoprolol; Rythmol [propafenone]; and Sulfa drugs cross reactors   Social History:  The patient  reports that she has never smoked. She has never used smokeless tobacco. She reports that she drinks alcohol. She reports that she does not use drugs.  Family History:  The patient's family history includes Diabetes in her father; Heart attack in her brother and mother; Heart disease in her father and mother; Hypertension in her father and mother.   ROS:  Please see the history of present illness.   Otherwise, review of systems is positive for none.   All other systems are reviewed and negative.   PHYSICAL EXAM: VS:  BP 134/78   Pulse 64   Ht 5\' 5"  (1.651 m)   Wt 235 lb (106.6 kg)   LMP 10/29/2013 (Approximate)   BMI 39.11 kg/m  , BMI Body mass index is 39.11 kg/m. GEN: Well nourished, well developed, in no acute distress  HEENT: normal  Neck: no JVD, carotid bruits, or masses Cardiac: RRR; no murmurs, rubs, or gallops,no edema  Respiratory:  clear to auscultation bilaterally, normal work of breathing GI: soft, nontender, nondistended, + BS MS: no deformity or atrophy  Skin: warm and dry Neuro:  Strength and sensation are intact Psych: euthymic mood, full affect  EKG:  EKG is ordered today. Personal review of the ekg ordered shows sinus rhythm, PVC   Recent Labs: 05/14/2017: ALT 11; BUN  23; Creat 0.98; Hemoglobin 13.2; Magnesium 2.5; Platelets 299; Potassium 4.2; Sodium 139; TSH 1.85    Lipid Panel     Component Value Date/Time   CHOL 241 (H) 05/14/2017 1619   TRIG 261 (H) 05/14/2017 1619   HDL 43 (L) 05/14/2017 1619   CHOLHDL 5.6 (H) 05/14/2017 1619   VLDL 46 (H) 08/16/2016 1647   LDLCALC 155 (H) 05/14/2017 1619     Wt Readings from Last 3 Encounters:  07/12/17 235 lb (106.6 kg)  06/28/17 230 lb (104.3 kg)  05/14/17 234 lb 12.8 oz (106.5 kg)      Other studies Reviewed: Additional studies/ records that were reviewed today include: TTE 12/19/16 Review of the above records today demonstrates:  - Left ventricle: The cavity size was normal. Wall thickness was   normal. Systolic function was normal. The estimated ejection   fraction was in the range of 55% to 60%. Wall motion was normal;   there were no regional wall motion abnormalities. Features are   consistent with a pseudonormal left ventricular filling pattern,   with concomitant abnormal relaxation and increased filling   pressure (grade 2 diastolic dysfunction). - Aortic valve: There was no stenosis. - Mitral valve: There was mild regurgitation. - Left atrium: The atrium was mildly dilated. - Right ventricle: The cavity size was normal. Systolic function   was normal. - Right atrium: The atrium was mildly dilated. - Tricuspid valve: Peak RV-RA gradient (S): 32 mm Hg. - Pulmonary arteries: PA peak pressure: 35 mm Hg (S). - Inferior vena cava: The vessel was normal in size. The   respirophasic diameter changes were in the normal range (>= 50%),   consistent with normal central venous pressure.   ASSESSMENT AND PLAN:  1.  Persistent atrial fibrillation: He on Eliquis.  Had repeat ablation for both atrial fibrillation and atrial flutter 04/10/17.  I am concerned that without antiarrhythmics she will continue to have atrial arrhythmias.  Sinus rhythm today.  Her only complaint is fatigue and weakness.  It  is possible that this is due to her carvedilol.  We will stop it and start her on 180 mg of diltiazem.  This patients CHA2DS2-VASc Score and unadjusted Ischemic Stroke Rate (% per year) is equal to 3.2 % stroke rate/year from a score of 3  Above score calculated as  1 point each if present [CHF, HTN, DM, Vascular=MI/PAD/Aortic Plaque, Age if 65-74, or Female] Above score calculated as 2 points each if present [Age > 75, or Stroke/TIA/TE]   2. Hypertension: Pressure well controlled.  No changes.  3. Mild OSA: 10 you to try conservative measures.  4. Atrial flutter, typical: Ablation 02/08/17 with repeat ablation 04/10/17.  Remains in sinus rhythm.  Current medicines are reviewed at length with the patient today.   The patient does not have concerns regarding her medicines.  The following changes were made today: Stop carvedilol start diltiazem  Labs/ tests ordered today include:  Orders Placed This Encounter  Procedures  . EKG 12-Lead     Disposition:   FU with Will Camnitz 6 months  Signed, Will Meredith Leeds, MD  07/12/2017 3:52 PM     [address] [place] [address] [phone] (office) [phone] (fax)

## 2017-07-12 NOTE — Patient Instructions (Signed)
Medication Instructions:  Your physician has recommended you make the following change in your medication:  1. STOP Carvedilol 2. START Diltiazem 180 mg once daily  Labwork: None ordered  Testing/Procedures: None ordered  Follow-Up: Your physician wants you to follow-up in: 6 months with Dr. Curt Bears.  You will receive a reminder letter in the mail two months in advance. If you don't receive a letter, please call our office to schedule the follow-up appointment.  * If you need a refill on your cardiac medications before your next appointment, please call your pharmacy.   *Please note that any paperwork needing to be filled out by the provider will need to be addressed at the front desk prior to seeing the provider. Please note that any FMLA, disability or other documents regarding health condition is subject to a $25.00 charge that must be received prior to completion of paperwork in the form of a money order or check.  Thank you for choosing [facility]!!   Trinidad Curet, RN [phone]  Any Other Special Instructions Will Be Listed Below (If Applicable).  Diltiazem extended-release capsules or tablets What is this medicine? DILTIAZEM (dil TYE a zem) is a calcium-channel blocker. It affects the amount of calcium found in your heart and muscle cells. This relaxes your blood vessels, which can reduce the amount of work the heart has to do. This medicine is used to treat high blood pressure and chest pain caused by angina. This medicine may be used for other purposes; ask your health care provider or pharmacist if you have questions. COMMON BRAND NAME(S): Cardizem CD, Cardizem LA, Cardizem SR, Cartia XT, Dilacor XR, Dilt-CD, Diltia XT, Diltzac, Matzim LA, Rema Fendt, Tiamate, Tiazac What should I tell my health care provider before I take this medicine? They need to know if you have any of these conditions: -heart problems, low blood pressure, irregular heartbeat -liver  disease -previous heart attack -an unusual or allergic reaction to diltiazem, other medicines, foods, dyes, or preservatives -pregnant or trying to get pregnant -breast-feeding How should I use this medicine? Take this medicine by mouth with a glass of water. Follow the directions on the prescription label. Swallow whole, do not crush or chew. Ask your doctor or pharmacist if your should take this medicine with food. Take your doses at regular intervals. Do not take your medicine more often then directed. Do not stop taking except on the advice of your doctor or health care professional. Ask your doctor or health care professional how to gradually reduce the dose. Talk to your pediatrician regarding the use of this medicine in children. Special care may be needed. Overdosage: If you think you have taken too much of this medicine contact a poison control center or emergency room at once. NOTE: This medicine is only for you. Do not share this medicine with others. What if I miss a dose? If you miss a dose, take it as soon as you can. If it is almost time for your next dose, take only that dose. Do not take double or extra doses. What may interact with this medicine? Do not take this medicine with any of the following medications: -cisapride -hawthorn -pimozide -ranolazine -red yeast rice This medicine may also interact with the following medications: -buspirone -carbamazepine -cimetidine -cyclosporine -digoxin -local anesthetics or general anesthetics -lovastatin -medicines for anxiety or difficulty sleeping like midazolam and triazolam -medicines for high blood pressure or heart problems -quinidine -rifampin, rifabutin, or rifapentine This list may not describe all possible  interactions. Give your health care provider a list of all the medicines, herbs, non-prescription drugs, or dietary supplements you use. Also tell them if you smoke, drink alcohol, or use illegal drugs. Some items  may interact with your medicine. What should I watch for while using this medicine? Check your blood pressure and pulse rate regularly. Ask your doctor or health care professional what your blood pressure and pulse rate should be and when you should contact him or her. You may feel dizzy or lightheaded. Do not drive, use machinery, or do anything that needs mental alertness until you know how this medicine affects you. To reduce the risk of dizzy or fainting spells, do not sit or stand up quickly, especially if you are an older patient. Alcohol can make you more dizzy or increase flushing and rapid heartbeats. Avoid alcoholic drinks. What side effects may I notice from receiving this medicine? Side effects that you should report to your doctor or health care professional as soon as possible: -allergic reactions like skin rash, itching or hives, swelling of the face, lips, or tongue -confusion, mental depression -feeling faint or lightheaded, falls -redness, blistering, peeling or loosening of the skin, including inside the mouth -slow, irregular heartbeat -swelling of the feet and ankles -unusual bleeding or bruising, pinpoint red spots on the skin Side effects that usually do not require medical attention (report to your doctor or health care professional if they continue or are bothersome): -constipation or diarrhea -difficulty sleeping -facial flushing -headache -nausea, vomiting -sexual dysfunction -weak or tired This list may not describe all possible side effects. Call your doctor for medical advice about side effects. You may report side effects to FDA at 1-800-FDA-1088. Where should I keep my medicine? Keep out of the reach of children. Store at room temperature between 15 and 30 degrees C (59 and 86 degrees F). Protect from humidity. Throw away any unused medicine after the expiration date. NOTE: This sheet is a summary. It may not cover all possible information. If you have  questions about this medicine, talk to your doctor, pharmacist, or health care provider.  2018 Elsevier/Gold Standard (2007-07-24 14:35:47)

## 2017-07-12 NOTE — Addendum Note (Signed)
Addended by: Stanton Kidney on: 07/12/2017 03:58 PM   Modules accepted: Orders

## 2017-07-31 ENCOUNTER — Other Ambulatory Visit: Payer: Self-pay | Admitting: Cardiology

## 2017-08-21 ENCOUNTER — Ambulatory Visit: Payer: Self-pay | Admitting: Adult Health

## 2017-08-26 ENCOUNTER — Other Ambulatory Visit: Payer: Self-pay | Admitting: Physician Assistant

## 2017-09-19 ENCOUNTER — Encounter: Payer: Self-pay | Admitting: Internal Medicine

## 2017-09-20 ENCOUNTER — Other Ambulatory Visit: Payer: Self-pay | Admitting: Internal Medicine

## 2017-10-26 ENCOUNTER — Other Ambulatory Visit: Payer: Self-pay | Admitting: Internal Medicine

## 2017-11-13 ENCOUNTER — Other Ambulatory Visit: Payer: Self-pay | Admitting: Obstetrics and Gynecology

## 2017-11-13 DIAGNOSIS — Z1231 Encounter for screening mammogram for malignant neoplasm of breast: Secondary | ICD-10-CM

## 2017-11-18 ENCOUNTER — Other Ambulatory Visit: Payer: Self-pay | Admitting: Internal Medicine

## 2017-11-19 DIAGNOSIS — Z6839 Body mass index (BMI) 39.0-39.9, adult: Secondary | ICD-10-CM | POA: Diagnosis not present

## 2017-11-19 DIAGNOSIS — Z79899 Other long term (current) drug therapy: Secondary | ICD-10-CM | POA: Diagnosis not present

## 2017-11-19 DIAGNOSIS — N951 Menopausal and female climacteric states: Secondary | ICD-10-CM | POA: Diagnosis not present

## 2017-11-19 DIAGNOSIS — E669 Obesity, unspecified: Secondary | ICD-10-CM | POA: Diagnosis not present

## 2017-11-27 ENCOUNTER — Ambulatory Visit: Payer: BLUE CROSS/BLUE SHIELD | Admitting: Internal Medicine

## 2017-11-27 ENCOUNTER — Encounter: Payer: Self-pay | Admitting: Internal Medicine

## 2017-11-27 ENCOUNTER — Other Ambulatory Visit: Payer: Self-pay | Admitting: *Deleted

## 2017-11-27 VITALS — BP 122/80 | HR 68 | Temp 97.3°F | Resp 18 | Ht 65.5 in | Wt 235.8 lb

## 2017-11-27 DIAGNOSIS — Z131 Encounter for screening for diabetes mellitus: Secondary | ICD-10-CM

## 2017-11-27 DIAGNOSIS — Z1211 Encounter for screening for malignant neoplasm of colon: Secondary | ICD-10-CM

## 2017-11-27 DIAGNOSIS — Z111 Encounter for screening for respiratory tuberculosis: Secondary | ICD-10-CM

## 2017-11-27 DIAGNOSIS — Z0001 Encounter for general adult medical examination with abnormal findings: Secondary | ICD-10-CM

## 2017-11-27 DIAGNOSIS — E559 Vitamin D deficiency, unspecified: Secondary | ICD-10-CM

## 2017-11-27 DIAGNOSIS — Z6838 Body mass index (BMI) 38.0-38.9, adult: Secondary | ICD-10-CM

## 2017-11-27 DIAGNOSIS — Z1212 Encounter for screening for malignant neoplasm of rectum: Secondary | ICD-10-CM

## 2017-11-27 DIAGNOSIS — Z8249 Family history of ischemic heart disease and other diseases of the circulatory system: Secondary | ICD-10-CM

## 2017-11-27 DIAGNOSIS — Z13220 Encounter for screening for lipoid disorders: Secondary | ICD-10-CM

## 2017-11-27 DIAGNOSIS — I10 Essential (primary) hypertension: Secondary | ICD-10-CM

## 2017-11-27 DIAGNOSIS — Z1389 Encounter for screening for other disorder: Secondary | ICD-10-CM

## 2017-11-27 DIAGNOSIS — Z136 Encounter for screening for cardiovascular disorders: Secondary | ICD-10-CM

## 2017-11-27 DIAGNOSIS — Z130 Encounter for screening for diseases of the blood and blood-forming organs and certain disorders involving the immune mechanism: Secondary | ICD-10-CM

## 2017-11-27 DIAGNOSIS — E039 Hypothyroidism, unspecified: Secondary | ICD-10-CM

## 2017-11-27 DIAGNOSIS — Z79899 Other long term (current) drug therapy: Secondary | ICD-10-CM

## 2017-11-27 DIAGNOSIS — E782 Mixed hyperlipidemia: Secondary | ICD-10-CM

## 2017-11-27 DIAGNOSIS — Z0000 Encounter for general adult medical examination without abnormal findings: Secondary | ICD-10-CM

## 2017-11-27 DIAGNOSIS — I480 Paroxysmal atrial fibrillation: Secondary | ICD-10-CM

## 2017-11-27 DIAGNOSIS — R7309 Other abnormal glucose: Secondary | ICD-10-CM

## 2017-11-27 DIAGNOSIS — R5383 Other fatigue: Secondary | ICD-10-CM

## 2017-11-27 MED ORDER — TRAMADOL HCL 50 MG PO TABS: ORAL_TABLET | ORAL | 0 refills | Status: DC

## 2017-11-27 MED ORDER — TOPIRAMATE 50 MG PO TABS
ORAL_TABLET | ORAL | 5 refills | Status: DC
Start: 2017-11-27 — End: 2018-01-08

## 2017-11-27 MED ORDER — PHENTERMINE HCL 37.5 MG PO TABS: ORAL_TABLET | ORAL | 5 refills | Status: DC

## 2017-11-27 MED ORDER — FUROSEMIDE 20 MG PO TABS: 20.0000 mg | ORAL_TABLET | ORAL | 2 refills | Status: DC | PRN

## 2017-11-27 NOTE — Patient Instructions (Signed)
Bleeding Precautions When on Anticoagulant Therapy  WHAT IS ANTICOAGULANT THERAPY? Anticoagulant therapy is taking medicine to prevent or reduce blood clots. It is also called blood thinner therapy. Blood clots that form in your blood vessels can be dangerous. They can break loose and travel to your heart, lungs, or brain. This increases your risk of a heart attack or stroke. Anticoagulant therapy causes blood to clot more slowly. You may need anticoagulant therapy if you have:  A medical condition that increases the likelihood that blood clots will form.  A heart defect or a problem with heart rhythm. It is also a common treatment after heart surgery, such as valve replacement. WHAT ARE COMMON TYPES OF ANTICOAGULANT THERAPY? Anticoagulant medicine can be injected or taken by mouth.If you need anticoagulant therapy quickly at the hospital, the medicine may be injected under your skin or given through an IV tube. Heparin is a common example of an anticoagulant that you may get at the hospital. Most anticoagulant therapy is in the form of pills that you take at home every day. These may include:  Aspirin. This common blood thinner works by preventing blood cells (platelets) from sticking together to form a clot. Aspirin is not as strong as anticoagulants that slow down the time that it takes for your body to form a clot.  Clopidogrel. This is a newer type of drug that affects platelets. It is stronger than aspirin.  Warfarin. This is the most common anticoagulant. It changes the way your body uses vitamin K, a vitamin that helps your blood to clot. The risk of bleeding is higher with warfarin than with aspirin. You will need frequent blood tests to make sure you are taking the safest amount.  New anticoagulants. Several new drugs have been approved. They are all taken by mouth. Studies show that these drugs work as well as warfarin. They do not require blood testing. They may cause less bleeding  risk than warfarin. WHAT DO I NEED TO REMEMBER WHEN TAKING ANTICOAGULANT THERAPY? Anticoagulant therapy decreases your risk of forming a blood clot, but it increases your risk of bleeding. Work closely with your health care provider to make sure you are taking your medicine safely. These tips can help:  Learn ways to reduce your risk of bleeding.  If you are taking warfarin: ? Have blood tests as ordered by your health care provider. ? Do not make any sudden changes to your diet. Vitamin K in your diet can make warfarin less effective. ? Do not get pregnant. This medicine may cause birth defects.  Take your medicine at the same time every day. If you forget to take your medicine, take it as soon as you remember. If you miss a whole day, do not double your dose of medicine. Take your normal dose and call your health care provider to check in.  Do not stop taking your medicine on your own.  Tell your health care provider before you start taking any new medicine, vitamin, or herbal product. Some of these could interfere with your therapy.  Tell all of your health care providers that you are on anticoagulant therapy.  Do not have surgery, medical procedures, or dental work until you tell your health care provider that you are on anticoagulant therapy. WHAT CAN AFFECT HOW ANTICOAGULANTS WORK? Certain foods, vitamins, medicines, supplements, and herbal medicines change the way that anticoagulant therapy works. They may increase or decrease the effects of your anticoagulant therapy. Either result can be dangerous for you.    Many over-the-counter medicines for pain, colds, or stomach problems interfere with anticoagulant therapy. Take these only as told by your health care provider.  Do not drink alcohol. It can interfere with your medicine and increase your risk of an injury that causes bleeding.  If you are taking warfarin, do not begin eating more foods that contain vitamin K. These include  leafy green vegetables. Ask your health care provider if you should avoid any foods. WHAT ARE SOME WAYS TO PREVENT BLEEDING? You can prevent bleeding by taking certain precautions:  Be extra careful when you use knives, scissors, or other sharp objects.  Use an electric razor instead of a blade.  Do not use toothpicks.  Use a soft toothbrush.  Wear shoes that have nonskid soles.  Use bath mats and handrails in your bathroom.  Wear gloves while you do yard work.  Wear a helmet when you ride a bike.  Wear your seat belt.  Prevent falls by removing loose rugs and extension cords from areas where you walk.  Do not play contact sports or participate in other activities that have a high risk of injury. [place] PROVIDER? Call your health care provider if:  You miss a dose of medicine: ? And you are not sure what to do. ? For more than one day.  You have: ? Menstrual bleeding that is heavier than normal. ? Blood in your urine. ? A bloody nose or bleeding gums. ? Easy bruising. ? Blood in your stool (feces) or have black and tarry stool. ? Side effects from your medicine.  You feel weak or dizzy.  You become pregnant. Seek immediate medical care if:  You have bleeding that will not stop.  You have sudden and severe headache or belly pain.  You vomit or you cough up bright red blood.  You have a severe blow to your head. WHAT ARE SOME QUESTIONS TO ASK MY HEALTH CARE PROVIDER?  What is the best anticoagulant therapy for my condition?  What side effects should I watch for?  When should I take my medicine? What should I do if I forget to take it?  Will I need to have regular blood tests?  Do I need to change my diet? Are there foods or drinks that I should avoid?  What activities are safe for me?  What should I do if I want to get pregnant? This information is not intended to replace advice given to you by your health care provider.  Make sure you discuss any questions you have with your health care provider. Document Released: 03/14/2015 Document Reviewed: 03/14/2015 Elsevier Interactive Patient Education  2017 [employer].  ++++++++++++++++++++++++ Preventive Care for Adults  A healthy lifestyle and preventive care can promote health and wellness. Preventive health guidelines for women include the following key practices.  A routine yearly physical is a good way to check with your health care provider about your health and preventive screening. It is a chance to share any concerns and updates on your health and to receive a thorough exam.  Visit your dentist for a routine exam and preventive care every 6 months. Brush your teeth twice a day and floss once a day. Good oral hygiene prevents tooth decay and gum disease.  The frequency of eye exams is based on your age, health, family medical history, use of contact lenses, and other factors. Follow your health care provider's recommendations for frequency of eye exams.  Eat a healthy  diet. Foods like vegetables, fruits, whole grains, low-fat dairy products, and lean protein foods contain the nutrients you need without too many calories. Decrease your intake of foods high in solid fats, added sugars, and salt. Eat the right amount of calories for you. Get information about a proper diet from your health care provider, if necessary.  Regular physical exercise is one of the most important things you can do for your health. Most adults should get at least 150 minutes of moderate-intensity exercise (any activity that increases your heart rate and causes you to sweat) each week. In addition, most adults need muscle-strengthening exercises on 2 or more days a week.  Maintain a healthy weight. The body mass index (BMI) is a screening tool to identify possible weight problems. It provides an estimate of body fat based on height and weight. Your health care provider can find your BMI  and can help you achieve or maintain a healthy weight. For adults 20 years and older:  A BMI below 18.5 is considered underweight.  A BMI of 18.5 to 24.9 is normal.  A BMI of 25 to 29.9 is considered overweight.  A BMI of 30 and above is considered obese.  Maintain normal blood lipids and cholesterol levels by exercising and minimizing your intake of saturated fat. Eat a balanced diet with plenty of fruit and vegetables. Blood tests for lipids and cholesterol should begin at age 71 and be repeated every 5 years. If your lipid or cholesterol levels are high, you are over 50, or you are at high risk for heart disease, you may need your cholesterol levels checked more frequently. Ongoing high lipid and cholesterol levels should be treated with medicines if diet and exercise are not working.  If you smoke, find out from your health care provider how to quit. If you do not use tobacco, do not start.  Lung cancer screening is recommended for adults aged 56-80 years who are at high risk for developing lung cancer because of a history of smoking. A yearly low-dose CT scan of the lungs is recommended for people who have at least a 30-pack-year history of smoking and are a current smoker or have quit within the past 15 years. A pack year of smoking is smoking an average of 1 pack of cigarettes a day for 1 year (for example: 1 pack a day for 30 years or 2 packs a day for 15 years). Yearly screening should continue until the smoker has stopped smoking for at least 15 years. Yearly screening should be stopped for people who develop a health problem that would prevent them from having lung cancer treatment.  High blood pressure causes heart disease and increases the risk of stroke. Your blood pressure should be checked at least every 1 to 2 years. Ongoing high blood pressure should be treated with medicines if weight loss and exercise do not work.  If you are 62-107 years old, ask your health care provider if you  should take aspirin to prevent strokes.  Diabetes screening involves taking a blood sample to check your fasting blood sugar level. This should be done once every 3 years, after age 62, if you are within normal weight and without risk factors for diabetes. Testing should be considered at a younger age or be carried out more frequently if you are overweight and have at least 1 risk factor for diabetes.  Breast cancer screening is essential preventive care for women. You should practice "breast self-awareness." This means understanding  the normal appearance and feel of your breasts and may include breast self-examination. Any changes detected, no matter how small, should be reported to a health care provider. Women in their 56s and 30s should have a clinical breast exam (CBE) by a health care provider as part of a regular health exam every 1 to 3 years. After age 92, women should have a CBE every year. Starting at age 37, women should consider having a mammogram (breast X-ray test) every year. Women who have a family history of breast cancer should talk to their health care provider about genetic screening. Women at a high risk of breast cancer should talk to their health care providers about having an MRI and a mammogram every year.  Breast cancer gene (BRCA)-related cancer risk assessment is recommended for women who have family members with BRCA-related cancers. BRCA-related cancers include breast, ovarian, tubal, and peritoneal cancers. Having family members with these cancers may be associated with an increased risk for harmful changes (mutations) in the breast cancer genes BRCA1 and BRCA2. Results of the assessment will determine the need for genetic counseling and BRCA1 and BRCA2 testing.  Routine pelvic exams to screen for cancer are no longer recommended for nonpregnant women who are considered low risk for cancer of the pelvic organs (ovaries, uterus, and vagina) and who do not have symptoms. Ask  your health care provider if a screening pelvic exam is right for you.  If you have had past treatment for cervical cancer or a condition that could lead to cancer, you need Pap tests and screening for cancer for at least 20 years after your treatment. If Pap tests have been discontinued, your risk factors (such as having a new sexual partner) need to be reassessed to determine if screening should be resumed. Some women have medical problems that increase the chance of getting cervical cancer. In these cases, your health care provider may recommend more frequent screening and Pap tests.  Colorectal cancer can be detected and often prevented. Most routine colorectal cancer screening begins at the age of 66 years and continues through age 78 years. However, your health care provider may recommend screening at an earlier age if you have risk factors for colon cancer. On a yearly basis, your health care provider may provide home test kits to check for hidden blood in the stool. Use of a small camera at the end of a tube, to directly examine the colon (sigmoidoscopy or colonoscopy), can detect the earliest forms of colorectal cancer. Talk to your health care provider about this at age 54, when routine screening begins.  Direct exam of the colon should be repeated every 5-10 years through age 52 years, unless early forms of pre-cancerous polyps or small growths are found.  Hepatitis C blood testing is recommended for all people born from [birth year] through 1965 and any individual with known risks for hepatitis C.  Pra  Osteoporosis is a disease in which the bones lose minerals and strength with aging. This can result in serious bone fractures or breaks. The risk of osteoporosis can be identified using a bone density scan. Women ages 9 years and over and women at risk for fractures or osteoporosis should discuss screening with their health care providers. Ask your health care provider whether you should take a calcium  supplement or vitamin D to reduce the rate of osteoporosis.  Menopause can be associated with physical symptoms and risks. Hormone replacement therapy is available to decrease symptoms and risks. You should  talk to your health care provider about whether hormone replacement therapy is right for you.  Use sunscreen. Apply sunscreen liberally and repeatedly throughout the day. You should seek shade when your shadow is shorter than you. Protect yourself by wearing long sleeves, pants, a wide-brimmed hat, and sunglasses year round, whenever you are outdoors.  Once a month, do a whole body skin exam, using a mirror to look at the skin on your back. Tell your health care provider of new moles, moles that have irregular borders, moles that are larger than a pencil eraser, or moles that have changed in shape or color.  Stay current with required vaccines (immunizations).  Influenza vaccine. All adults should be immunized every year.  Tetanus, diphtheria, and acellular pertussis (Td, Tdap) vaccine. Pregnant women should receive 1 dose of Tdap vaccine during each pregnancy. The dose should be obtained regardless of the length of time since the last dose. Immunization is preferred during the 27th-36th week of gestation. An adult who has not previously received Tdap or who does not know her vaccine status should receive 1 dose of Tdap. This initial dose should be followed by tetanus and diphtheria toxoids (Td) booster doses every 10 years. Adults with an unknown or incomplete history of completing a 3-dose immunization series with Td-containing vaccines should begin or complete a primary immunization series including a Tdap dose. Adults should receive a Td booster every 10 years.  Varicella vaccine. An adult without evidence of immunity to varicella should receive 2 doses or a second dose if she has previously received 1 dose. Pregnant females who do not have evidence of immunity should receive the first dose  after pregnancy. This first dose should be obtained before leaving the health care facility. The second dose should be obtained 4-8 weeks after the first dose.  Human papillomavirus (HPV) vaccine. Females aged 13-26 years who have not received the vaccine previously should obtain the 3-dose series. The vaccine is not recommended for use in pregnant females. However, pregnancy testing is not needed before receiving a dose. If a female is found to be pregnant after receiving a dose, no treatment is needed. In that case, the remaining doses should be delayed until after the pregnancy. Immunization is recommended for any person with an immunocompromised condition through the age of 8 years if she did not get any or all doses earlier. During the 3-dose series, the second dose should be obtained 4-8 weeks after the first dose. The third dose should be obtained 24 weeks after the first dose and 16 weeks after the second dose.  Zoster vaccine. One dose is recommended for adults aged 42 years or older unless certain conditions are present.  Measles, mumps, and rubella (MMR) vaccine. Adults born before [birth year] generally are considered immune to measles and mumps. Adults born in [birth year] or later should have 1 or more doses of MMR vaccine unless there is a contraindication to the vaccine or there is laboratory evidence of immunity to each of the three diseases. A routine second dose of MMR vaccine should be obtained at least 28 days after the first dose for students attending postsecondary schools, health care workers, or international travelers. People who received inactivated measles vaccine or an unknown type of measles vaccine during 1963-1967 should receive 2 doses of MMR vaccine. People who received inactivated mumps vaccine or an unknown type of mumps vaccine before 1979 and are at high risk for mumps infection should consider immunization with 2 doses of MMR vaccine. For  females of childbearing age, rubella immunity  should be determined. If there is no evidence of immunity, females who are not pregnant should be vaccinated. If there is no evidence of immunity, females who are pregnant should delay immunization until after pregnancy. Unvaccinated health care workers born before [birth year] who lack laboratory evidence of measles, mumps, or rubella immunity or laboratory confirmation of disease should consider measles and mumps immunization with 2 doses of MMR vaccine or rubella immunization with 1 dose of MMR vaccine.  Pneumococcal 13-valent conjugate (PCV13) vaccine. When indicated, a person who is uncertain of her immunization history and has no record of immunization should receive the PCV13 vaccine. An adult aged 61 years or older who has certain medical conditions and has not been previously immunized should receive 1 dose of PCV13 vaccine. This PCV13 should be followed with a dose of pneumococcal polysaccharide (PPSV23) vaccine. The PPSV23 vaccine dose should be obtained at least 1 or more year(s) after the dose of PCV13 vaccine. An adult aged 37 years or older who has certain medical conditions and previously received 1 or more doses of PPSV23 vaccine should receive 1 dose of PCV13. The PCV13 vaccine dose should be obtained 1 or more years after the last PPSV23 vaccine dose.    Pneumococcal polysaccharide (PPSV23) vaccine. When PCV13 is also indicated, PCV13 should be obtained first. All adults aged 38 years and older should be immunized. An adult younger than age 78 years who has certain medical conditions should be immunized. Any person who resides in a nursing home or long-term care facility should be immunized. An adult smoker should be immunized. People with an immunocompromised condition and certain other conditions should receive both PCV13 and PPSV23 vaccines. People with human immunodeficiency virus (HIV) infection should be immunized as soon as possible after diagnosis. Immunization during chemotherapy or  radiation therapy should be avoided. Routine use of PPSV23 vaccine is not recommended for American Indians, [place] Natives, or people younger than 65 years unless there are medical conditions that require PPSV23 vaccine. When indicated, people who have unknown immunization and have no record of immunization should receive PPSV23 vaccine. One-time revaccination 5 years after the first dose of PPSV23 is recommended for people aged 19-64 years who have chronic kidney failure, nephrotic syndrome, asplenia, or immunocompromised conditions. People who received 1-2 doses of PPSV23 before age 82 years should receive another dose of PPSV23 vaccine at age 50 years or later if at least 5 years have passed since the previous dose. Doses of PPSV23 are not needed for people immunized with PPSV23 at or after age 88 years.  Preventive Services / Frequency   Ages 43 to 72 years  Blood pressure check.  Lipid and cholesterol check.  Lung cancer screening. / Every year if you are aged 99-80 years and have a 30-pack-year history of smoking and currently smoke or have quit within the past 15 years. Yearly screening is stopped once you have quit smoking for at least 15 years or develop a health problem that would prevent you from having lung cancer treatment.  Clinical breast exam.** / Every year after age 13 years.   BRCA-related cancer risk assessment.** / For women who have family members with a BRCA-related cancer (breast, ovarian, tubal, or peritoneal cancers).  Mammogram.** / Every year beginning at age 85 years and continuing for as long as you are in good health. Consult with your health care provider.  Pap test.** / Every 3 years starting at age 84 years through age  65 or 70 years with a history of 3 consecutive normal Pap tests.  HPV screening.** / Every 3 years from ages 89 years through ages 27 to 8 years with a history of 3 consecutive normal Pap tests.  Fecal occult blood test (FOBT) of stool. / Every  year beginning at age 64 years and continuing until age 5 years. You may not need to do this test if you get a colonoscopy every 10 years.  Flexible sigmoidoscopy or colonoscopy.** / Every 5 years for a flexible sigmoidoscopy or every 10 years for a colonoscopy beginning at age 14 years and continuing until age 61 years.  Hepatitis C blood test.** / For all people born from [birth year] through 1967 and any individual with known risks for hepatitis C.  Skin self-exam. / Monthly.  Influenza vaccine. / Every year.  Tetanus, diphtheria, and acellular pertussis (Tdap/Td) vaccine.** / Consult your health care provider. Pregnant women should receive 1 dose of Tdap vaccine during each pregnancy. 1 dose of Td every 10 years.  Varicella vaccine.** / Consult your health care provider. Pregnant females who do not have evidence of immunity should receive the first dose after pregnancy.  Zoster vaccine.** / 1 dose for adults aged 66 years or older.  Pneumococcal 13-valent conjugate (PCV13) vaccine.** / Consult your health care provider.  Pneumococcal polysaccharide (PPSV23) vaccine.** / 1 to 2 doses if you smoke cigarettes or if you have certain conditions.  Meningococcal vaccine.** / Consult your health care provider.  Hepatitis A vaccine.** / Consult your health care provider.  Hepatitis B vaccine.** / Consult your health care provider. Screening for abdominal aortic aneurysm (AAA)  by ultrasound is recommended for people over 50 who have history of high blood pressure or who are current or former smokers. ++++++++++++++++++ Recommend Adult Low Dose Aspirin or  coated  Aspirin 81 mg daily  To reduce risk of Colon Cancer 20 %,  Skin Cancer 26 % ,  Melanoma 46%  and  Pancreatic cancer 60% +++++++++++++++++++ Vitamin D goal  is between 70-100.  Please make sure that you are taking your Vitamin D as directed.  It is very important as a natural anti-inflammatory  helping hair, skin, and nails, as  well as reducing stroke and heart attack risk.  It helps your bones and helps with mood. It also decreases numerous cancer risks so please take it as directed.  Low Vit D is associated with a 200-300% higher risk for CANCER  and 200-300% higher risk for HEART   ATTACK  &  STROKE.   .....................................[place] Kitchen It is also associated with higher death rate at younger ages,  autoimmune diseases like Rheumatoid arthritis, Lupus, Multiple Sclerosis.    Also many other serious conditions, like depression, Alzheimer's Dementia, infertility, muscle aches, fatigue, fibromyalgia - just to name a few. ++++++++++++++++++ Recommend the book "The END of DIETING" by Dr Excell Seltzer  & the book "The END of DIABETES " by Dr Excell Seltzer At [email or web address] - get book & Audio CD's    Being diabetic has a  300% increased risk for heart attack, stroke, cancer, and alzheimer- type vascular dementia. It is very important that you work harder with diet by avoiding all foods that are white. Avoid white rice (brown & wild rice is OK), white potatoes (sweetpotatoes in moderation is OK), White bread or wheat bread or anything made out of white flour like bagels, donuts, rolls, buns, biscuits, cakes, pastries, cookies, pizza crust, and pasta (made from white flour & egg  whites) - vegetarian pasta or spinach or wheat pasta is OK. Multigrain breads like Arnold's or [employer], or multigrain sandwich thins or flatbreads.  Diet, exercise and weight loss can reverse and cure diabetes in the early stages.  Diet, exercise and weight loss is very important in the control and prevention of complications of diabetes which affects every system in your body, ie. Brain - dementia/stroke, eyes - glaucoma/blindness, heart - heart attack/heart failure, kidneys - dialysis, stomach - gastric paralysis, intestines - malabsorption, nerves - severe painful neuritis, circulation - gangrene & loss of a leg(s), and finally cancer and  Alzheimers.    I recommend avoid fried & greasy foods,  sweets/candy, white rice (brown or wild rice or Quinoa is OK), white potatoes (sweet potatoes are OK) - anything made from white flour - bagels, doughnuts, rolls, buns, biscuits,white and wheat breads, pizza crust and traditional pasta made of white flour & egg white(vegetarian pasta or spinach or wheat pasta is OK).  Multi-grain bread is OK - like multi-grain flat bread or sandwich thins. Avoid alcohol in excess. Exercise is also important.    Eat all the vegetables you want - avoid meat, especially red meat and dairy - especially cheese.  Cheese is the most concentrated form of trans-fats which is the worst thing to clog up our arteries. Veggie cheese is OK which can be found in the fresh produce section at [employer] or [employer] or [employer]  ++++++++++++++++++++++ DASH Eating Plan  DASH stands for "Dietary Approaches to Stop Hypertension."   The DASH eating plan is a healthy eating plan that has been shown to reduce high blood pressure (hypertension). Additional health benefits may include reducing the risk of type 2 diabetes mellitus, heart disease, and stroke. The DASH eating plan may also help with weight loss. WHAT DO I NEED TO KNOW ABOUT THE DASH EATING PLAN? For the DASH eating plan, you will follow these general guidelines:  Choose foods with a percent daily value for sodium of less than 5% (as listed on the food label).  Use salt-free seasonings or herbs instead of table salt or sea salt.  Check with your health care provider or pharmacist before using salt substitutes.  Eat lower-sodium products, often labeled as "lower sodium" or "no salt added."  Eat fresh foods.  Eat more vegetables, fruits, and low-fat dairy products.  Choose whole grains. Look for the word "whole" as the first word in the ingredient list.  Choose fish   Limit sweets, desserts, sugars, and sugary drinks.  Choose heart-healthy fats.  Eat  veggie cheese   Eat more home-cooked food and less restaurant, buffet, and fast food.  Limit fried foods.  Cook foods using methods other than frying.  Limit canned vegetables. If you do use them, rinse them well to decrease the sodium.  When eating at a restaurant, ask that your food be prepared with less salt, or no salt if possible.                      WHAT FOODS CAN I EAT? Read Dr Fara Olden Fuhrman's books on The End of Dieting & The End of Diabetes  Grains Whole grain or whole wheat bread. Brown rice. Whole grain or whole wheat pasta. Quinoa, bulgur, and whole grain cereals. Low-sodium cereals. Corn or whole wheat flour tortillas. Whole grain cornbread. Whole grain crackers. Low-sodium crackers.  Vegetables Fresh or frozen vegetables (raw, steamed, roasted, or grilled). Low-sodium or reduced-sodium tomato and vegetable juices.  Low-sodium or reduced-sodium tomato sauce and paste. Low-sodium or reduced-sodium canned vegetables.   Fruits All fresh, canned (in natural juice), or frozen fruits.  Protein Products  All fish and seafood.  Dried beans, peas, or lentils. Unsalted nuts and seeds. Unsalted canned beans.  Dairy Low-fat dairy products, such as skim or 1% milk, 2% or reduced-fat cheeses, low-fat ricotta or cottage cheese, or plain low-fat yogurt. Low-sodium or reduced-sodium cheeses.  Fats and Oils Tub margarines without trans fats. Light or reduced-fat mayonnaise and salad dressings (reduced sodium). Avocado. Safflower, olive, or canola oils. Natural peanut or almond butter.  Other Unsalted popcorn and pretzels. The items listed above may not be a complete list of recommended foods or beverages. Contact your dietitian for more options.  ++++++++++++++++++  WHAT FOODS ARE NOT RECOMMENDED? Grains/ White flour or wheat flour White bread. White pasta. White rice. Refined cornbread. Bagels and croissants. Crackers that contain trans fat.  Vegetables  Creamed or fried  vegetables. Vegetables in a . Regular canned vegetables. Regular canned tomato sauce and paste. Regular tomato and vegetable juices.  Fruits Dried fruits. Canned fruit in light or heavy syrup. Fruit juice.  Meat and Other Protein Products Meat in general - RED meat & White meat.  Fatty cuts of meat. Ribs, chicken wings, all processed meats as bacon, sausage, bologna, salami, fatback, hot dogs, bratwurst and packaged luncheon meats.  Dairy Whole or 2% milk, cream, half-and-half, and cream cheese. Whole-fat or sweetened yogurt. Full-fat cheeses or blue cheese. Non-dairy creamers and whipped toppings. Processed cheese, cheese spreads, or cheese curds.  Condiments Onion and garlic salt, seasoned salt, table salt, and sea salt. Canned and packaged gravies. Worcestershire sauce. Tartar sauce. Barbecue sauce. Teriyaki sauce. Soy sauce, including reduced sodium. Steak sauce. Fish sauce. Oyster sauce. Cocktail sauce. Horseradish. Ketchup and mustard. Meat flavorings and tenderizers. Bouillon cubes. Hot sauce. Tabasco sauce. Marinades. Taco seasonings. Relishes.  Fats and Oils Butter, stick margarine, lard, shortening and bacon fat. Coconut, palm kernel, or palm oils. Regular salad dressings.  Pickles and olives. Salted popcorn and pretzels.  The items listed above may not be a complete list of foods and beverages to avoid.

## 2017-11-27 NOTE — Progress Notes (Signed)
[facility] Unk Pinto, M.D.     Uvaldo Bristle. Silverio Lay, P.A.-C Liane Comber, [place], [place] [address] Telephone [phone] Telefax [phone] Annual Screening/Preventative Visit & Comprehensive Evaluation &  Examination     This very nice 57 y.o. DWF  presents for a Screening /Preventative Visit & comprehensive evaluation and management of multiple medical co-morbidities.  Patient has been followed for HTN, HLD, Prediabetes  and Vitamin D Deficiency.    Patient has long hx/o pAfib (CHA2DS2VASC 2 ) maintained on Eliquis. She had AF ablation 01/13/16 by Dr Curt Bears.  She was in and out of atrial flutter  She had a repeat ablation on 04/11/17 with extensive left atrial ablation for multiple atypical atrial flutter's. Required cardioversion to return to sinus rhythm.  She completed her 3rd Ablation on 12/278/2018 by Dr Rayann Heman      HTN predates since 2013 . Patient's BP has been controlled at home and patient denies any cardiac symptoms as chest pain, palpitations, shortness of breath, dizziness or ankle swelling. Today's BP is at goal - 122/80.      Patient's hyperlipidemia is not controlled with diet and she is reticent to take Statins for hx/o intolerance to Lipitor. . Patient denies myalgias or other medication SE's. Last lipids were not at goal: Lab Results  Component Value Date   CHOL 231 (H) 11/27/2017   HDL 45 (L) 11/27/2017   LDLCALC 149 (H) 11/27/2017   TRIG 228 (H) 11/27/2017   CHOLHDL 5.1 (H) 11/27/2017      Patient has hx/o  Morbid Obesity  (BMI 38+) and is monitored expectantly for prediabetes predating since      and patient denies reactive hypoglycemic symptoms, visual blurring, diabetic polys, or paresthesias. Last A1c was Normal: Lab Results  Component Value Date   HGBA1C 5.7 (H) 11/27/2017       Patient has been on Thyroid Replacement since Nov 2014.      Finally,  patient has history of Vitamin D Deficiency and last Vitamin D was at goal:  Lab Results  Component Value Date   VD25OH 86 11/27/2017   Current Outpatient Medications on File Prior to Visit  Medication Sig  . augmented betamethasone dipropionate (DIPROLENE-AF) 0.05 % cream Apply 1 application topically daily as needed (for psoriasis).   . Calcium 600-400 MG-UNIT CHEW Chew 1 each by mouth 2 (two) times daily.   . Cholecalciferol (VITAMIN D PO) Take 10,000 Units by mouth daily.   . Coenzyme Q10 (CO Q10) 100 MG CAPS Take 100 mg by mouth daily.   Marland Kitchen ELIQUIS 5 MG TABS tablet TAKE 1 TABLET BY MOUTH TWICE A DAY  . flecainide (TAMBOCOR) 100 MG tablet TAKE 1 TABLET BY MOUTH TWICE A DAY  . levothyroxine (SYNTHROID, LEVOTHROID) 50 MCG tablet TAKE 1 TABLET (50 MCG TOTAL) BY MOUTH DAILY.  Marland Kitchen loratadine (CLARITIN) 10 MG tablet TAKE 10 MG BY MOUTH DAILY AS NEEDED FOR ALLERGY RELIEF  . losartan (COZAAR) 25 MG tablet Take 25 mg by mouth daily.  . Magnesium 500 MG TABS Take 500 mg by mouth every evening.   . montelukast (SINGULAIR) 10 MG tablet TAKE 1 TABLET BY MOUTH EVERY DAY  . Multiple Vitamin (MULTIVITAMIN WITH MINERALS) TABS tablet Take 1 tablet by mouth daily.  . NONFORMULARY OR COMPOUNDED ITEM Place 1 each vaginally See admin instructions. Estriol/testosterone 0.25-0.25 vaginal suppository compound. Insert vaginally on Sunday, Monday and Wednesday  . NONFORMULARY OR COMPOUNDED ITEM Place  1 application onto the skin See admin instructions. Biest 8/2 HRT compound  Apply topically every day except Saturday.  . NONFORMULARY OR COMPOUNDED ITEM Apply 1 each topically See admin instructions. Progesterone 8%/12ml Apply 0.31mls topically every day except Saturday.  Vladimir Faster Glycol-Propyl Glycol (SYSTANE OP) Place 1 drop into both eyes daily.  . Probiotic Product (PROBIOTIC DAILY PO) Take 1 tablet by mouth daily.  . Turmeric 500 MG CAPS Take 500 mg by mouth 2 (two) times daily.  Marland Kitchen diltiazem (CARDIZEM CD) 180  MG 24 hr capsule Take 1 capsule (180 mg total) by mouth daily.   No current facility-administered medications on file prior to visit.    Allergies  Allergen Reactions  . Lipitor [Atorvastatin] Other (See Comments)    "neck pain"  . Metoprolol Other (See Comments)    DIDN'T WORK FOR PATIENT AND GAINED WEIGHT MIGHT HAVE BEEN TAKEN WITH RYTHMOL  . Rythmol [Propafenone] Other (See Comments)    "weight gain"  . Sulfa Drugs Cross Reactors Other (See Comments)    "soreness all over"   Past Medical History:  Diagnosis Date  . Arthritis    "probably in my knees; maybe in my hands" (04/11/2017)  . Asthma attack 10/2015 X 1  . Atrial flutter ([facility] [place]) 10/2015  . Heart murmur    "comes and goes" (04/11/2017)  . Hepatitis 1972   "don't know what kind"  . High cholesterol   . Hypertension   . Hypothyroidism   . Malignant melanoma of leg ([place])    "right thigh"  . PAF (paroxysmal atrial fibrillation) ([facility] [facility])   . Pneumonia 1966; 1967   "left lung collapsed one of these times"  . Psoriasis   . Rosacea   . Vitamin D deficiency    Health Maintenance  Topic Date Due  . PAP SMEAR  10/01/1979  . COLONOSCOPY  09/30/2008  . INFLUENZA VACCINE  11/14/2017  . MAMMOGRAM  12/08/2018  . TETANUS/TDAP  06/22/2025  . Hepatitis C Screening  Completed  . HIV Screening  Completed   Immunization History  Administered Date(s) Administered  . PPD Test 12/07/2013, 12/22/2014, 08/17/2016, 11/27/2017  . Tdap 06/23/2015   Last Colon - never - declines  Last MGM - 12/07/2016 and f/u schedules 12/18/2017  Past Surgical History:  Procedure Laterality Date  . A-FLUTTER ABLATION N/A 02/08/2017   Procedure: A-Flutter Ablation;  Surgeon: Constance Haw, MD;  Location: [facility];  Service: Cardiovascular;  Laterality: N/A;  . ATRIAL FIBRILLATION ABLATION N/A 04/11/2017   Procedure: ATRIAL FIBRILLATION ABLATION;  Surgeon: Constance Haw, MD;  Location: [facility];  Service: Cardiovascular;   Laterality: N/A;  . CARDIOVERSION N/A 10/25/2015   Procedure: CARDIOVERSION;  Surgeon: Sueanne Margarita, MD;  Location: [facility];  Service: Cardiovascular;  Laterality: N/A;  . [place]  . CESAREAN SECTION WITH BILATERAL TUBAL LIGATION  1982  . ELECTROPHYSIOLOGIC STUDY N/A 01/13/2016   Procedure: Atrial Fibrillation Ablation;  Surgeon: Will Meredith Leeds, MD;  Location: [facility];  Service: Cardiovascular;  Laterality: N/A;  . ENDOMETRIAL ABLATION  ~ 2012  . MELANOMA EXCISION Right 10/1975   outer thigh "malignant"  . REFRACTIVE SURGERY Bilateral 1990s  . TEE WITHOUT CARDIOVERSION N/A 10/25/2015   Procedure: TRANSESOPHAGEAL ECHOCARDIOGRAM (TEE);  Surgeon: Sueanne Margarita, MD;  Location[facility];  Service: Cardiovascular;  Laterality: N/A;  . TEE WITHOUT CARDIOVERSION N/A 01/11/2016   Procedure: TRANSESOPHAGEAL ECHOCARDIOGRAM (TEE);  Surgeon: Josue Hector, MD;  Location: [facility]  END[facility]COPY;  Service: Cardiovascular;  Laterality: N/A;  . TONSILLECTOMY  1967  . TUBAL LIGATION     Family History  Problem Relation Age of Onset  . Heart disease Mother   . Hypertension Mother   . Heart attack Mother   . Heart disease Father   . Diabetes Father   . Hypertension Father   . Heart attack Brother   . Colon cancer Neg Hx   . Stroke Neg Hx    Social History   Tobacco Use  . Smoking status: Never Smoker  . Smokeless tobacco: Never Used  Substance Use Topics  . Alcohol use: Yes    Comment: 04/11/2017 "q couple months I'll have a couple drinks "  . Drug use: No    ROS Constitutional: Denies fever, chills, weight loss/gain, headaches, insomnia,  night sweats, and change in appetite. Does c/o fatigue. Eyes: Denies redness, blurred vision, diplopia, discharge, itchy, watery eyes.  ENT: Denies discharge, congestion, post nasal drip, epistaxis, sore throat, earache, hearing loss, dental pain, Tinnitus, Vertigo, Sinus pain, snoring.  Cardio: Denies chest pain, palpitations,  irregular heartbeat, syncope, dyspnea, diaphoresis, orthopnea, PND, claudication, edema Respiratory: denies cough, dyspnea, DOE, pleurisy, hoarseness, laryngitis, wheezing.  Gastrointestinal: Denies dysphagia, heartburn, reflux, water brash, pain, cramps, nausea, vomiting, bloating, diarrhea, constipation, hematemesis, melena, hematochezia, jaundice, hemorrhoids Genitourinary: Denies dysuria, frequency, urgency, nocturia, hesitancy, discharge, hematuria, flank pain Breast: Breast lumps, nipple discharge, bleeding.  Musculoskeletal: Denies arthralgia, myalgia, stiffness, Jt. Swelling, pain, limp, and strain/sprain. Denies falls. Skin: Denies puritis, rash, hives, warts, acne, eczema, changing in skin lesion Neuro: No weakness, tremor, incoordination, spasms, paresthesia, pain Psychiatric: Denies confusion, memory loss, sensory loss. Denies Depression. Endocrine: Denies change in weight, skin, hair change, nocturia, and paresthesia, diabetic polys, visual blurring, hyper / hypo glycemic episodes.  Heme/Lymph: No excessive bleeding, bruising, enlarged lymph nodes.  Physical Exam  BP 122/80   Pulse 68   Temp (!) 97.3 F (36.3 C)   Resp 18   Ht 5' 5.5" (1.664 m)   Wt 235 lb 12.8 oz (107 kg)   LMP 10/29/2013 (Approximate)   BMI 38.64 kg/m   General Appearance: Well nourished, well groomed and in no apparent distress.  Eyes: PERRLA, EOMs, conjunctiva no swelling or erythema, normal fundi and vessels. Sinuses: No frontal/maxillary tenderness ENT/Mouth: EACs patent / TMs  nl. Nares clear without erythema, swelling, mucoid exudates. Oral hygiene is good. No erythema, swelling, or exudate. Tongue normal, non-obstructing. Tonsils not swollen or erythematous. Hearing normal.  Neck: Supple, thyroid not palpable. No bruits, nodes or JVD. Respiratory: Respiratory effort normal.  BS equal and clear bilateral without rales, rhonci, wheezing or stridor. Cardio: Heart sounds are normal with regular rate  and rhythm and no murmurs, rubs or gallops. Peripheral pulses are normal and equal bilaterally without edema. No aortic or femoral bruits. Chest: symmetric with normal excursions and percussion. Breasts: Symmetric, without lumps, nipple discharge, retractions, or fibrocystic changes.  Abdomen: Flat, soft with bowel sounds active. Nontender, no guarding, rebound, hernias, masses, or organomegaly.  Lymphatics: Non tender without lymphadenopathy.  Genitourinary:  Musculoskeletal: Full ROM all peripheral extremities, joint stability, 5/5 strength, and normal gait. Skin: Warm and dry without rashes, lesions, cyanosis, clubbing or  ecchymosis.  Neuro: Cranial nerves intact, reflexes equal bilaterally. Normal muscle tone, no cerebellar symptoms. Sensation intact.  Pysch: Alert and oriented X 3, normal affect, Insight and Judgment appropriate.   Assessment and Plan  1. Annual Preventative Screening Examination  2. Essential hypertension  - EKG 12-Lead -  [place], RETROPERITNL ABD,  LTD - Urinalysis, Routine w reflex microscopic - Microalbumin / creatinine urine ratio - CBC with Differential/Platelet - COMPLETE METABOLIC PANEL WITH GFR - Magnesium - TSH  3. Hyperlipidemia, mixed  - EKG 12-Lead - Lipid panel - TSH  4. Abnormal glucose  - EKG 12-Lead - [place], RETROPERITNL ABD,  LTD - Hemoglobin A1c - Insulin, random  5. Vitamin D deficiency  - VITAMIN D 25 Hydroxyl  6. Hypothyroidism  - TSH  7. Paroxysmal atrial fibrillation (HCC)  - EKG 12-Lead - TSH  8. Screening examination for pulmonary tuberculosis  - PPD  9. Screening for ischemic heart disease  - EKG 12-Lead  10. FHx: heart disease  - EKG 12-Lead - [place], RETROPERITNL ABD,  LTD  11. Screening for AAA (aortic abdominal aneurysm)  - [place], RETROPERITNL ABD,  LTD  12. Screening for colorectal cancer  - POC Hemoccult Bld/Stl  13. Fatigue  - Iron,Total/Total Iron Binding Cap - Vitamin B12 - CBC with  Differential/Platelet - TSH  14. Medication management  - Urinalysis, Routine w reflex microscopic - Microalbumin / creatinine urine ratio - CBC with Differential/Platelet        Patient was counseled in prudent diet to achieve/maintain BMI less than 25 for weight control, BP monitoring, regular exercise and medications. Discussed med's effects and SE's. Screening labs and tests as requested with regular follow-up as recommended. Over 40 minutes of exam, counseling, chart review and high complex critical decision making was performed.

## 2017-11-28 ENCOUNTER — Other Ambulatory Visit: Payer: Self-pay | Admitting: Internal Medicine

## 2017-11-28 LAB — LIPID PANEL
Cholesterol: 231 mg/dL — ABNORMAL HIGH (ref <200)
HDL: 45 mg/dL — ABNORMAL LOW (ref >50)
LDL Cholesterol (Calc): 149 mg/dL (calc) — ABNORMAL HIGH
Non-HDL Cholesterol (Calc): 186 mg/dL (calc) — ABNORMAL HIGH (ref <130)
Total CHOL/HDL Ratio: 5.1 (calc) — ABNORMAL HIGH (ref <5.0)
Triglycerides: 228 mg/dL — ABNORMAL HIGH (ref <150)

## 2017-11-28 LAB — CBC WITH DIFFERENTIAL/PLATELET
Basophils Absolute: 77 cells/uL (ref 0–200)
Basophils Relative: 0.6 %
Eosinophils Absolute: 141 cells/uL (ref 15–500)
Eosinophils Relative: 1.1 %
HCT: 40.8 % (ref 35.0–45.0)
Hemoglobin: 13.3 g/dL (ref 11.7–15.5)
Lymphs Abs: 3738 cells/uL (ref 850–3900)
MCH: 28.2 pg (ref 27.0–33.0)
MCHC: 32.6 g/dL (ref 32.0–36.0)
MCV: 86.4 fL (ref 80.0–100.0)
MPV: 10.2 fL (ref 7.5–12.5)
Monocytes Relative: 7 %
Neutro Abs: 7949 cells/uL — ABNORMAL HIGH (ref 1500–7800)
Neutrophils Relative %: 62.1 %
Platelets: 335 10*3/uL (ref 140–400)
RBC: 4.72 10*6/uL (ref 3.80–5.10)
RDW: 12.8 % (ref 11.0–15.0)
Total Lymphocyte: 29.2 %
WBC mixed population: 896 cells/uL (ref 200–950)
WBC: 12.8 10*3/uL — ABNORMAL HIGH (ref 3.8–10.8)

## 2017-11-28 LAB — COMPLETE METABOLIC PANEL WITH GFR
AG Ratio: 1.8 (calc) (ref 1.0–2.5)
ALT: 10 U/L (ref 6–29)
AST: 12 U/L (ref 10–35)
Albumin: 4.7 g/dL (ref 3.6–5.1)
Alkaline phosphatase (APISO): 54 U/L (ref 33–130)
BUN: 24 mg/dL (ref 7–25)
CO2: 27 mmol/L (ref 20–32)
Calcium: 9.9 mg/dL (ref 8.6–10.4)
Chloride: 101 mmol/L (ref 98–110)
Creat: 0.9 mg/dL (ref 0.50–1.05)
GFR, Est African American: 81 mL/min/{1.73_m2} (ref >=60)
GFR, Est Non African American: 70 mL/min/{1.73_m2} (ref >=60)
Globulin: 2.6 g/dL (calc) (ref 1.9–3.7)
Glucose, Bld: 100 mg/dL — ABNORMAL HIGH (ref 65–99)
Potassium: 4.5 mmol/L (ref 3.5–5.3)
Sodium: 138 mmol/L (ref 135–146)
Total Bilirubin: 0.4 mg/dL (ref 0.2–1.2)
Total Protein: 7.3 g/dL (ref 6.1–8.1)

## 2017-11-28 LAB — URINALYSIS, ROUTINE W REFLEX MICROSCOPIC
Bilirubin Urine: NEGATIVE
Glucose, UA: NEGATIVE
Hgb urine dipstick: NEGATIVE
Ketones, ur: NEGATIVE
Leukocytes, UA: NEGATIVE
Nitrite: NEGATIVE
Protein, ur: NEGATIVE
Specific Gravity, Urine: 1.01 (ref 1.001–1.03)
pH: 5.5 (ref 5.0–8.0)

## 2017-11-28 LAB — HEMOGLOBIN A1C
Hgb A1c MFr Bld: 5.7 % of total Hgb — ABNORMAL HIGH (ref <5.7)
Mean Plasma Glucose: 117 (calc)
eAG (mmol/L): 6.5 (calc)

## 2017-11-28 LAB — MAGNESIUM: Magnesium: 2.4 mg/dL (ref 1.5–2.5)

## 2017-11-28 LAB — VITAMIN B12: Vitamin B-12: >2000 pg/mL — ABNORMAL HIGH (ref 200–1100)

## 2017-11-28 LAB — IRON, TOTAL/TOTAL IRON BINDING CAP
%SAT: 12 % (calc) — ABNORMAL LOW (ref 16–45)
Iron: 45 ug/dL (ref 45–160)
TIBC: 365 mcg/dL (calc) (ref 250–450)

## 2017-11-28 LAB — MICROALBUMIN / CREATININE URINE RATIO
Creatinine, Urine: 35 mg/dL (ref 20–275)
Microalb, Ur: <0.2 mg/dL

## 2017-11-28 LAB — VITAMIN D 25 HYDROXY (VIT D DEFICIENCY, FRACTURES): Vit D, 25-Hydroxy: 75 ng/mL (ref 30–100)

## 2017-11-28 LAB — TSH: TSH: 3.21 mIU/L (ref 0.40–4.50)

## 2017-11-28 LAB — INSULIN, RANDOM: Insulin: 7.2 u[IU]/mL (ref 2.0–19.6)

## 2017-11-28 MED ORDER — EZETIMIBE 10 MG PO TABS: ORAL_TABLET | ORAL | 1 refills | Status: DC

## 2017-12-01 ENCOUNTER — Encounter: Payer: Self-pay | Admitting: Internal Medicine

## 2017-12-02 LAB — TB SKIN TEST
Induration: 0 mm
TB Skin Test: NEGATIVE

## 2017-12-03 ENCOUNTER — Other Ambulatory Visit: Payer: Self-pay | Admitting: Internal Medicine

## 2017-12-04 ENCOUNTER — Other Ambulatory Visit: Payer: Self-pay | Admitting: Internal Medicine

## 2017-12-04 ENCOUNTER — Encounter: Payer: Self-pay | Admitting: *Deleted

## 2017-12-04 DIAGNOSIS — L409 Psoriasis, unspecified: Secondary | ICD-10-CM | POA: Insufficient documentation

## 2017-12-04 MED ORDER — BETAMETHASONE DIPROPIONATE AUG 0.05 % EX CREA: 1.0000 "application " | TOPICAL_CREAM | Freq: Every day | CUTANEOUS | 5 refills | Status: DC | PRN

## 2017-12-04 MED ORDER — CLOBETASOL PROPIONATE EMULSION 0.05 % EX FOAM: CUTANEOUS | 5 refills | Status: DC

## 2017-12-08 ENCOUNTER — Other Ambulatory Visit: Payer: Self-pay | Admitting: Internal Medicine

## 2017-12-08 DIAGNOSIS — L409 Psoriasis, unspecified: Secondary | ICD-10-CM

## 2017-12-08 DIAGNOSIS — L4050 Arthropathic psoriasis, unspecified: Secondary | ICD-10-CM

## 2017-12-08 MED ORDER — HALOBETASOL PROPIONATE 0.05 % EX OINT: TOPICAL_OINTMENT | Freq: Two times a day (BID) | CUTANEOUS | 11 refills | Status: DC

## 2017-12-11 ENCOUNTER — Ambulatory Visit: Payer: 59

## 2017-12-15 ENCOUNTER — Other Ambulatory Visit: Payer: Self-pay | Admitting: Cardiology

## 2017-12-18 ENCOUNTER — Ambulatory Visit
Admission: RE | Admit: 2017-12-18 | Discharge: 2017-12-18 | Disposition: A | Discharge status: Home / Self Care | Payer: BLUE CROSS/BLUE SHIELD | Source: Ambulatory Visit | Attending: Obstetrics and Gynecology | Admitting: Obstetrics and Gynecology

## 2017-12-18 ENCOUNTER — Other Ambulatory Visit: Payer: Self-pay | Admitting: Internal Medicine

## 2017-12-18 DIAGNOSIS — Z1231 Encounter for screening mammogram for malignant neoplasm of breast: Secondary | ICD-10-CM

## 2017-12-25 DIAGNOSIS — M47812 Spondylosis without myelopathy or radiculopathy, cervical region: Secondary | ICD-10-CM | POA: Diagnosis not present

## 2017-12-25 DIAGNOSIS — M1711 Unilateral primary osteoarthritis, right knee: Secondary | ICD-10-CM | POA: Diagnosis not present

## 2017-12-25 DIAGNOSIS — M25561 Pain in right knee: Secondary | ICD-10-CM | POA: Diagnosis not present

## 2017-12-25 DIAGNOSIS — M19042 Primary osteoarthritis, left hand: Secondary | ICD-10-CM | POA: Diagnosis not present

## 2017-12-25 DIAGNOSIS — M1990 Unspecified osteoarthritis, unspecified site: Secondary | ICD-10-CM | POA: Diagnosis not present

## 2017-12-25 DIAGNOSIS — M19041 Primary osteoarthritis, right hand: Secondary | ICD-10-CM | POA: Diagnosis not present

## 2017-12-25 DIAGNOSIS — M79642 Pain in left hand: Secondary | ICD-10-CM | POA: Diagnosis not present

## 2017-12-25 DIAGNOSIS — M25562 Pain in left knee: Secondary | ICD-10-CM | POA: Diagnosis not present

## 2017-12-25 DIAGNOSIS — M79641 Pain in right hand: Secondary | ICD-10-CM | POA: Diagnosis not present

## 2017-12-25 DIAGNOSIS — L4050 Arthropathic psoriasis, unspecified: Secondary | ICD-10-CM | POA: Diagnosis not present

## 2017-12-25 DIAGNOSIS — M1712 Unilateral primary osteoarthritis, left knee: Secondary | ICD-10-CM | POA: Diagnosis not present

## 2017-12-25 DIAGNOSIS — M79643 Pain in unspecified hand: Secondary | ICD-10-CM | POA: Diagnosis not present

## 2017-12-25 DIAGNOSIS — M542 Cervicalgia: Secondary | ICD-10-CM | POA: Diagnosis not present

## 2018-01-06 DIAGNOSIS — H40013 Open angle with borderline findings, low risk, bilateral: Secondary | ICD-10-CM | POA: Diagnosis not present

## 2018-01-06 DIAGNOSIS — Z9889 Other specified postprocedural states: Secondary | ICD-10-CM | POA: Diagnosis not present

## 2018-01-06 DIAGNOSIS — H35412 Lattice degeneration of retina, left eye: Secondary | ICD-10-CM | POA: Diagnosis not present

## 2018-01-08 ENCOUNTER — Ambulatory Visit: Payer: BLUE CROSS/BLUE SHIELD | Admitting: Cardiology

## 2018-01-08 ENCOUNTER — Encounter: Payer: Self-pay | Admitting: Cardiology

## 2018-01-08 VITALS — BP 130/80 | HR 66 | Ht 65.5 in | Wt 238.0 lb

## 2018-01-08 DIAGNOSIS — I10 Essential (primary) hypertension: Secondary | ICD-10-CM

## 2018-01-08 DIAGNOSIS — I481 Persistent atrial fibrillation: Secondary | ICD-10-CM

## 2018-01-08 DIAGNOSIS — I483 Typical atrial flutter: Secondary | ICD-10-CM | POA: Diagnosis not present

## 2018-01-08 DIAGNOSIS — G4733 Obstructive sleep apnea (adult) (pediatric): Secondary | ICD-10-CM

## 2018-01-08 DIAGNOSIS — I4819 Other persistent atrial fibrillation: Secondary | ICD-10-CM

## 2018-01-08 MED ORDER — DILTIAZEM HCL ER COATED BEADS 120 MG PO CP24: 120.0000 mg | ORAL_CAPSULE | Freq: Every day | ORAL | 3 refills | Status: DC

## 2018-01-08 NOTE — Patient Instructions (Addendum)
Medication Instructions:  Your physician has recommended you make the following change in your medication:  1. START Diltiazem 120 mg once daily  * If you need a refill on your cardiac medications before your next appointment, please call your pharmacy.   Labwork: None ordered *We will only notify you of abnormal results, otherwise continue current treatment plan.  Testing/Procedures: None ordered  Follow-Up: Your physician wants you to follow-up in: 6 months with Dr. Curt Bears.  You will receive a reminder letter in the mail two months in advance. If you don't receive a letter, please call our office to schedule the follow-up appointment.   Thank you for choosing [facility]!!   Trinidad Curet, RN [phone]

## 2018-01-08 NOTE — Progress Notes (Signed)
Electrophysiology Office Note   Date:  01/08/2018   ID:  Monica Morgan, DOB 1961/03/06, MRN [id number]  PCP:  Unk Pinto, MD  Cardiologist:  Radford Pax Primary Electrophysiologist:  Lakeshia Dohner Meredith Leeds, MD    No chief complaint on file.    History of Present Illness: Monica Morgan is a 57 y.o. female who presents today for electrophysiology evaluation.   Recent admission for tikosyn loading.  Had TEE/CV which restored sinus rhythm.  TEE noted EF 35-40%.  Discharged in sinus rhythm.  Continuing to have AF symptoms. Had AF ablation 01/13/16. Taken off tikosyn 12/17. She is felt well since last being seen. She presented to clinic in atrial flutter. She was in and out of atrial flutter and thus no cardioversion was performed.  Patient for typical atrial flutter on 03/11/17.  During the procedure, patient also had left atrial arrhythmias.  She had a repeat ablation on 04/11/17 with extensive left atrial ablation for multiple atypical atrial flutter's.  Required cardioversion to return to sinus rhythm.  She was discharged on flecainide.  Today, denies symptoms of palpitations, chest pain, shortness of breath, orthopnea, PND, lower extremity edema, claudication, dizziness, presyncope, syncope, bleeding, or neurologic sequela. The patient is tolerating medications without difficulties.  Overall she is feeling well.  She has noted no further episodes of atrial fibrillation or atrial flutter.  Unfortunately she continues to be fatigued and short of breath.  This was improved by switching from carvedilol to diltiazem.   Past Medical History:  Diagnosis Date  . Arthritis    "probably in my knees; maybe in my hands" (04/11/2017)  . Asthma attack 10/2015 X 1  . Atrial flutter ([place]) 10/2015  . Heart murmur    "comes and goes" (04/11/2017)  . Hepatitis 1972   "don't know what kind"  . High cholesterol   . Hypertension   . Hypothyroidism   . Malignant melanoma of leg ([place])    "right thigh"  .  PAF (paroxysmal atrial fibrillation) ([place])   . Pneumonia 1966; 1967   "left lung collapsed one of these times"  . Psoriasis   . Rosacea   . Vitamin D deficiency    Past Surgical History:  Procedure Laterality Date  . A-FLUTTER ABLATION N/A 02/08/2017   Procedure: A-Flutter Ablation;  Surgeon: Constance Haw, MD;  Location: [facility];  Service: Cardiovascular;  Laterality: N/A;  . ATRIAL FIBRILLATION ABLATION N/A 04/11/2017   Procedure: ATRIAL FIBRILLATION ABLATION;  Surgeon: Constance Haw, MD;  Location: [place];  Service: Cardiovascular;  Laterality: N/A;  . CARDIOVERSION N/A 10/25/2015   Procedure: CARDIOVERSION;  Surgeon: Sueanne Margarita, MD;  Location: [facility];  Service: Cardiovascular;  Laterality: N/A;  . [place]  . CESAREAN SECTION WITH BILATERAL TUBAL LIGATION  1982  . ELECTROPHYSIOLOGIC STUDY N/A 01/13/2016   Procedure: Atrial Fibrillation Ablation;  Surgeon: Yerik Zeringue Meredith Leeds, MD;  Location: [facility];  Service: Cardiovascular;  Laterality: N/A;  . ENDOMETRIAL ABLATION  ~ 2012  . MELANOMA EXCISION Right 10/1975   outer thigh "malignant"  . REFRACTIVE SURGERY Bilateral 1990s  . TEE WITHOUT CARDIOVERSION N/A 10/25/2015   Procedure: TRANSESOPHAGEAL ECHOCARDIOGRAM (TEE);  Surgeon: Sueanne Margarita, MD;  Location: [facility];  Service: Cardiovascular;  Laterality: N/A;  . TEE WITHOUT CARDIOVERSION N/A 01/11/2016   Procedure: TRANSESOPHAGEAL ECHOCARDIOGRAM (TEE);  Surgeon: Josue Hector, MD;  Location: [place];  Service: Cardiovascular;  Laterality: N/A;  . TONSILLECTOMY  1967  .  TUBAL LIGATION       Current Outpatient Medications  Medication Sig Dispense Refill  . augmented betamethasone dipropionate (DIPROLENE-AF) 0.05 % cream Apply 1 application topically daily as needed (for psoriasis). Apply very sparingly to Psoriasis rash (Do NOT use on face ! ) 50 g 5  . Calcium 600-400 MG-UNIT CHEW Chew 1 each by mouth 2  (two) times daily.     . Cholecalciferol (VITAMIN D PO) Take 10,000 Units by mouth daily.     . Coenzyme Q10 (CO Q10) 100 MG CAPS Take 100 mg by mouth daily.     Marland Kitchen ELIQUIS 5 MG TABS tablet TAKE 1 TABLET BY MOUTH TWICE A DAY 60 tablet 5  . ezetimibe (ZETIA) 10 MG tablet Take 1 tablet daily for Cholesterol 90 tablet 1  . flecainide (TAMBOCOR) 100 MG tablet TAKE 1 TABLET BY MOUTH TWICE A DAY 60 tablet 10  . furosemide (LASIX) 20 MG tablet Take 1 tablet (20 mg total) by mouth as needed (take 1 tablet daily as needed for 3-5lb weight gain, increased swelling). 30 tablet 2  . levothyroxine (SYNTHROID, LEVOTHROID) 50 MCG tablet TAKE 1 TABLET (50 MCG TOTAL) BY MOUTH DAILY. 90 tablet 1  . loratadine (CLARITIN) 10 MG tablet TAKE 10 MG BY MOUTH DAILY AS NEEDED FOR ALLERGY RELIEF  0  . losartan (COZAAR) 25 MG tablet Take 25 mg by mouth daily.    . Magnesium 500 MG TABS Take 500 mg by mouth every evening.     . montelukast (SINGULAIR) 10 MG tablet TAKE 1 TABLET BY MOUTH EVERY DAY 90 tablet 0  . Multiple Vitamin (MULTIVITAMIN WITH MINERALS) TABS tablet Take 1 tablet by mouth daily.    . NONFORMULARY OR COMPOUNDED ITEM Place 1 each vaginally See admin instructions. Estriol/testosterone 0.25-0.25 vaginal suppository compound. Insert vaginally on Sunday, Monday and Wednesday    . NONFORMULARY OR COMPOUNDED ITEM Apply 1 each topically See admin instructions. Progesterone 8%/66ml Apply 0.67mls topically every day except Saturday.    Vladimir Faster Glycol-Propyl Glycol (SYSTANE OP) Place 1 drop into both eyes daily.    . Probiotic Product (PROBIOTIC DAILY PO) Take 1 tablet by mouth daily.    . traMADol (ULTRAM) 50 MG tablet Take 1/2 to 1 tablet every 4 hours  as needed for pain 30 tablet 0  . Turmeric 500 MG CAPS Take 500 mg by mouth 2 (two) times daily.    Marland Kitchen diltiazem (CARDIZEM CD) 120 MG 24 hr capsule Take 1 capsule (120 mg total) by mouth daily. 90 capsule 3   No current facility-administered medications for this  visit.     Allergies:   Lipitor [atorvastatin]; Metoprolol; Rythmol [propafenone]; and Sulfa drugs cross reactors   Social History:  The patient  reports that she has never smoked. She has never used smokeless tobacco. She reports that she drinks alcohol. She reports that she does not use drugs.   Family History:  The patient's family history includes Diabetes in her father; Heart attack in her brother and mother; Heart disease in her father and mother; Hypertension in her father and mother.   ROS:  Please see the history of present illness.   Otherwise, review of systems is positive for cough.   All other systems are reviewed and negative.   PHYSICAL EXAM: VS:  BP 130/80   Pulse 66   Ht 5' 5.5" (1.664 m)   Wt 238 lb (108 kg)   LMP 10/29/2013 (Approximate)   BMI 39.00 kg/m  , BMI Body  mass index is 39 kg/m. GEN: Well nourished, well developed, in no acute distress  HEENT: normal  Neck: no JVD, carotid bruits, or masses Cardiac: RRR; no murmurs, rubs, or gallops,no edema  Respiratory:  clear to auscultation bilaterally, normal work of breathing GI: soft, nontender, nondistended, + BS MS: no deformity or atrophy  Skin: warm and dry Neuro:  Strength and sensation are intact Psych: euthymic mood, full affect  EKG:  EKG is ordered today. Personal review of the ekg ordered shows sinus rhythm, nonspecific ST changes, rate 66  Recent Labs: 11/27/2017: ALT 10; BUN 24; Creat 0.90; Hemoglobin 13.3; Magnesium 2.4; Platelets 335; Potassium 4.5; Sodium 138; TSH 3.21    Lipid Panel     Component Value Date/Time   CHOL 231 (H) 11/27/2017 1556   TRIG 228 (H) 11/27/2017 1556   HDL 45 (L) 11/27/2017 1556   CHOLHDL 5.1 (H) 11/27/2017 1556   VLDL 46 (H) 08/16/2016 1647   LDLCALC 149 (H) 11/27/2017 1556     Wt Readings from Last 3 Encounters:  01/08/18 238 lb (108 kg)  11/27/17 235 lb 12.8 oz (107 kg)  07/12/17 235 lb (106.6 kg)      Other studies Reviewed: Additional studies/  records that were reviewed today include: TTE 12/19/16 Review of the above records today demonstrates:  - Left ventricle: The cavity size was normal. Wall thickness was   normal. Systolic function was normal. The estimated ejection   fraction was in the range of 55% to 60%. Wall motion was normal;   there were no regional wall motion abnormalities. Features are   consistent with a pseudonormal left ventricular filling pattern,   with concomitant abnormal relaxation and increased filling   pressure (grade 2 diastolic dysfunction). - Aortic valve: There was no stenosis. - Mitral valve: There was mild regurgitation. - Left atrium: The atrium was mildly dilated. - Right ventricle: The cavity size was normal. Systolic function   was normal. - Right atrium: The atrium was mildly dilated. - Tricuspid valve: Peak RV-RA gradient (S): 32 mm Hg. - Pulmonary arteries: PA peak pressure: 35 mm Hg (S). - Inferior vena cava: The vessel was normal in size. The   respirophasic diameter changes were in the normal range (>= 50%),   consistent with normal central venous pressure.   ASSESSMENT AND PLAN:  1.  Persistent atrial fibrillation: On Eliquis.  Had repeat ablation for both atrial fibrillation and atrial flutter 04/10/2017.  She is concerned that she Wynn Alldredge have arrhythmias off antiarrhythmics.  Continue flecainide.  Trace Cederberg decrease diltiazem to 120 mg.   This patients CHA2DS2-VASc Score and unadjusted Ischemic Stroke Rate (% per year) is equal to 3.2 % stroke rate/year from a score of 3  Above score calculated as 1 point each if present [CHF, HTN, DM, Vascular=MI/PAD/Aortic Plaque, Age if 65-74, or Female] Above score calculated as 2 points each if present [Age > 75, or Stroke/TIA/TE]  2. Hypertension: Well-controlled.  No changes.  3. Mild OSA: Not using CPAP  4. Atrial flutter, typical: Ablation 02/08/2017 with repeat ablation 04/10/2017.  No recurrences.  5.  Morbid obesity: Diet and weight loss  encouraged.  Current medicines are reviewed at length with the patient today.   The patient does not have concerns regarding her medicines.  The following changes were made today: Decrease diltiazem  Labs/ tests ordered today include:  Orders Placed This Encounter  Procedures  . EKG 12-Lead     Disposition:   FU with Winton Offord 6 months  Signed, Deaven Urwin Meredith Leeds, MD  01/08/2018 10:37 AM     [facility] [address] [place] [address] [phone] (office) [phone] (fax)

## 2018-01-15 DIAGNOSIS — M79643 Pain in unspecified hand: Secondary | ICD-10-CM | POA: Diagnosis not present

## 2018-01-15 DIAGNOSIS — L4050 Arthropathic psoriasis, unspecified: Secondary | ICD-10-CM | POA: Diagnosis not present

## 2018-01-15 DIAGNOSIS — M1990 Unspecified osteoarthritis, unspecified site: Secondary | ICD-10-CM | POA: Diagnosis not present

## 2018-01-15 DIAGNOSIS — M542 Cervicalgia: Secondary | ICD-10-CM | POA: Diagnosis not present

## 2018-01-19 ENCOUNTER — Other Ambulatory Visit: Payer: Self-pay | Admitting: Cardiology

## 2018-01-20 MED ORDER — DILTIAZEM HCL ER COATED BEADS 120 MG PO CP24: 120.0000 mg | ORAL_CAPSULE | Freq: Every day | ORAL | 3 refills | Status: DC

## 2018-02-12 ENCOUNTER — Other Ambulatory Visit: Payer: Self-pay | Admitting: Internal Medicine

## 2018-02-25 DIAGNOSIS — L700 Acne vulgaris: Secondary | ICD-10-CM | POA: Diagnosis not present

## 2018-02-25 DIAGNOSIS — L409 Psoriasis, unspecified: Secondary | ICD-10-CM | POA: Diagnosis not present

## 2018-03-04 ENCOUNTER — Ambulatory Visit: Payer: Self-pay | Admitting: Adult Health

## 2018-03-16 ENCOUNTER — Other Ambulatory Visit: Payer: Self-pay | Admitting: Internal Medicine

## 2018-03-25 ENCOUNTER — Encounter: Payer: Self-pay | Admitting: Internal Medicine

## 2018-03-25 ENCOUNTER — Ambulatory Visit: Payer: BLUE CROSS/BLUE SHIELD | Admitting: Internal Medicine

## 2018-03-25 VITALS — BP 128/66 | HR 68 | Temp 98.4°F | Ht 65.5 in | Wt 240.4 lb

## 2018-03-25 DIAGNOSIS — E039 Hypothyroidism, unspecified: Secondary | ICD-10-CM

## 2018-03-25 DIAGNOSIS — Z6839 Body mass index (BMI) 39.0-39.9, adult: Secondary | ICD-10-CM

## 2018-03-25 DIAGNOSIS — Z79899 Other long term (current) drug therapy: Secondary | ICD-10-CM

## 2018-03-25 DIAGNOSIS — N951 Menopausal and female climacteric states: Secondary | ICD-10-CM

## 2018-03-25 NOTE — Progress Notes (Addendum)
Subjective:     Patient ID: Monica Morgan , female    DOB: [birth year] , 57 y.o.   MRN: [id number]   Chief Complaint  Patient presents with  . hormones f/u    HPI  She is here today for f/u BHRT. She has weaned herself off of Biest completely. She is using progesterone cream nightly along with estriol/testosterone cream three days weekly. She does report that this does seem to help with vaginal dryness.     Past Medical History:  Diagnosis Date  . Arthritis    "probably in my knees; maybe in my hands" (04/11/2017)  . Asthma attack 10/2015 X 1  . Atrial flutter ([place]) 10/2015  . Heart murmur    "comes and goes" (04/11/2017)  . Hepatitis 1972   "don't know what kind"  . High cholesterol   . Hypertension   . Hypothyroidism   . Malignant melanoma of leg ([place])    "right thigh"  . PAF (paroxysmal atrial fibrillation) ([place])   . Pneumonia 1966; 1967   "left lung collapsed one of these times"  . Psoriasis   . Rosacea   . Vitamin D deficiency      Family History  Problem Relation Age of Onset  . Heart disease Mother   . Hypertension Mother   . Heart attack Mother   . Heart disease Father   . Diabetes Father   . Hypertension Father   . Heart attack Brother   . Colon cancer Neg Hx   . Stroke Neg Hx      Current Outpatient Medications:  .  augmented betamethasone dipropionate (DIPROLENE-AF) 0.05 % cream, Apply 1 application topically daily as needed (for psoriasis). Apply very sparingly to Psoriasis rash (Do NOT use on face ! ), Disp: 50 g, Rfl: 5 .  Calcium 600-400 MG-UNIT CHEW, Chew 1 each by mouth 2 (two) times daily. , Disp: , Rfl:  .  Cholecalciferol (VITAMIN D PO), Take 10,000 Units by mouth daily. , Disp: , Rfl:  .  Coenzyme Q10 (CO Q10) 100 MG CAPS, Take 100 mg by mouth daily. , Disp: , Rfl:  .  diltiazem (CARDIZEM CD) 120 MG 24 hr capsule, Take 1 capsule (120 mg total) by mouth daily., Disp: 90 capsule, Rfl: 3 .  ELIQUIS 5 MG TABS tablet, TAKE 1 TABLET BY MOUTH  TWICE A DAY, Disp: 60 tablet, Rfl: 5 .  ezetimibe (ZETIA) 10 MG tablet, Take 1 tablet daily for Cholesterol, Disp: 90 tablet, Rfl: 1 .  flecainide (TAMBOCOR) 100 MG tablet, TAKE 1 TABLET BY MOUTH TWICE A DAY, Disp: 60 tablet, Rfl: 10 .  furosemide (LASIX) 20 MG tablet, TAKE 1 TABLET BY MOUTH AS NEEDED FOR 3-5LB WEIGHT GAIN, INCREASED SWELLING, Disp: 90 tablet, Rfl: 1 .  levothyroxine (SYNTHROID, LEVOTHROID) 50 MCG tablet, TAKE 1 TABLET (50 MCG TOTAL) BY MOUTH DAILY., Disp: 90 tablet, Rfl: 1 .  loratadine (CLARITIN) 10 MG tablet, TAKE 10 MG BY MOUTH DAILY AS NEEDED FOR ALLERGY RELIEF, Disp: , Rfl: 0 .  losartan (COZAAR) 25 MG tablet, Take 25 mg by mouth daily., Disp: , Rfl:  .  Magnesium 500 MG TABS, Take 500 mg by mouth every evening. , Disp: , Rfl:  .  montelukast (SINGULAIR) 10 MG tablet, TAKE 1 TABLET BY MOUTH EVERY DAY, Disp: 90 tablet, Rfl: 0 .  Multiple Vitamin (MULTIVITAMIN WITH MINERALS) TABS tablet, Take 1 tablet by mouth daily., Disp: , Rfl:  .  NONFORMULARY OR COMPOUNDED ITEM, Place 1 each vaginally  See admin instructions. Estriol/testosterone 0.25-0.25 vaginal suppository compound. Insert vaginally on Sunday, Monday and Wednesday, Disp: , Rfl:  .  NONFORMULARY OR COMPOUNDED ITEM, Apply 1 each topically See admin instructions. Progesterone 8%/7ml Apply 0.89mls topically every day except Saturday., Disp: , Rfl:  .  Polyethyl Glycol-Propyl Glycol (SYSTANE OP), Place 1 drop into both eyes daily., Disp: , Rfl:  .  Probiotic Product (PROBIOTIC DAILY PO), Take 1 tablet by mouth daily., Disp: , Rfl:  .  traMADol (ULTRAM) 50 MG tablet, Take 1/2 to 1 tablet every 4 hours  as needed for pain, Disp: 30 tablet, Rfl: 0 .  Turmeric 500 MG CAPS, Take 500 mg by mouth 2 (two) times daily., Disp: , Rfl:    Allergies  Allergen Reactions  . Lipitor [Atorvastatin] Other (See Comments)    "neck pain"  . Metoprolol Other (See Comments)    DIDN'T WORK FOR PATIENT AND GAINED WEIGHT MIGHT HAVE BEEN TAKEN  WITH RYTHMOL  . Rythmol [Propafenone] Other (See Comments)    "weight gain"  . Sulfa Drugs Cross Reactors Other (See Comments)    "soreness all over"     Review of Systems  Constitutional: Negative.   Respiratory: Negative.   Cardiovascular: Negative.   Gastrointestinal: Negative.   Genitourinary: Negative.   Neurological: Negative.      Today's Vitals   03/25/18 1553  BP: 128/66  Pulse: 68  Temp: 98.4 F (36.9 C)  TempSrc: Oral  Weight: 240 lb 6.4 oz (109 kg)  Height: 5' 5.5" (1.664 m)   Body mass index is 39.4 kg/m.   Objective:  Physical Exam Vitals signs and nursing note reviewed.  Constitutional:      Appearance: Normal appearance. She is obese.  HENT:     Head: Normocephalic and atraumatic.  Neck:     Musculoskeletal: Normal range of motion.  Cardiovascular:     Rate and Rhythm: Normal rate.     Heart sounds: Normal heart sounds.  Pulmonary:     Effort: Pulmonary effort is normal.     Breath sounds: Normal breath sounds.  Skin:    General: Skin is warm and dry.  Neurological:     General: No focal deficit present.     Mental Status: She is alert.  Psychiatric:        Mood and Affect: Mood normal.         Assessment And Plan:     1. Female climacteric state  Again, she is now off of bi-est. She will continue with progesterone 8% cream nightly except Sundays, and estriol/testosterone vaginally. [facility] contacted for refills of her current meds. I do plan to decrease progesterone concentration at her next visit. She will rto in four months for re-evaluation.    2. Primary hypothyroidism  I will check labs today. I want to confirm her thyroid function is not contributing to her weight gain.   - TSH  3. Drug therapy  - CBC no Diff - Testosterone, Total  4. Class 2 severe obesity due to excess calories with serious comorbidity and body mass index (BMI) of 39.0 to 39.9 in adult [facility])  She is encouraged to incorporate more exercise into her  daily routine. She is encouraged to aim for 30 minutes five days weekly.   Maximino Greenland, MD

## 2018-03-26 LAB — TSH: TSH: 2.01 u[IU]/mL (ref 0.450–4.500)

## 2018-03-26 LAB — CBC
Hematocrit: 39.6 % (ref 34.0–46.6)
Hemoglobin: 13.5 g/dL (ref 11.1–15.9)
MCH: 29.1 pg (ref 26.6–33.0)
MCHC: 34.1 g/dL (ref 31.5–35.7)
MCV: 85 fL (ref 79–97)
Platelets: 335 10*3/uL (ref 150–450)
RBC: 4.64 x10E6/uL (ref 3.77–5.28)
RDW: 13 % (ref 12.3–15.4)
WBC: 12.1 10*3/uL — ABNORMAL HIGH (ref 3.4–10.8)

## 2018-03-26 LAB — TESTOSTERONE: Testosterone: 10 ng/dL (ref 3–41)

## 2018-03-27 DIAGNOSIS — Z6841 Body Mass Index (BMI) 40.0 and over, adult: Secondary | ICD-10-CM | POA: Diagnosis not present

## 2018-03-27 DIAGNOSIS — Z01419 Encounter for gynecological examination (general) (routine) without abnormal findings: Secondary | ICD-10-CM | POA: Diagnosis not present

## 2018-03-27 NOTE — Progress Notes (Signed)
Here are your lab results:  Your thyroid function is normal. Blood count is normal, white count is elevated. This can sometimes be related to obesity (inflammation). Your serum testosterone level is within normal limits.   Happy holidays to you! Safe travels to the beach!  Sincerely,    Joaquim Tolen N. Baird Cancer, MD

## 2018-03-30 ENCOUNTER — Encounter: Payer: Self-pay | Admitting: Internal Medicine

## 2018-05-14 ENCOUNTER — Other Ambulatory Visit: Payer: Self-pay | Admitting: Internal Medicine

## 2018-05-19 DIAGNOSIS — M9901 Segmental and somatic dysfunction of cervical region: Secondary | ICD-10-CM | POA: Diagnosis not present

## 2018-05-19 DIAGNOSIS — M9903 Segmental and somatic dysfunction of lumbar region: Secondary | ICD-10-CM | POA: Diagnosis not present

## 2018-05-19 DIAGNOSIS — M25511 Pain in right shoulder: Secondary | ICD-10-CM | POA: Diagnosis not present

## 2018-05-19 DIAGNOSIS — M62838 Other muscle spasm: Secondary | ICD-10-CM | POA: Diagnosis not present

## 2018-05-20 DIAGNOSIS — M62838 Other muscle spasm: Secondary | ICD-10-CM | POA: Diagnosis not present

## 2018-05-20 DIAGNOSIS — M25511 Pain in right shoulder: Secondary | ICD-10-CM | POA: Diagnosis not present

## 2018-05-20 DIAGNOSIS — M9901 Segmental and somatic dysfunction of cervical region: Secondary | ICD-10-CM | POA: Diagnosis not present

## 2018-05-20 DIAGNOSIS — M9903 Segmental and somatic dysfunction of lumbar region: Secondary | ICD-10-CM | POA: Diagnosis not present

## 2018-05-27 ENCOUNTER — Other Ambulatory Visit: Payer: Self-pay | Admitting: Cardiology

## 2018-05-31 ENCOUNTER — Other Ambulatory Visit: Payer: Self-pay | Admitting: Internal Medicine

## 2018-06-02 ENCOUNTER — Other Ambulatory Visit: Payer: Self-pay | Admitting: *Deleted

## 2018-06-02 MED ORDER — APIXABAN 5 MG PO TABS: 5.0000 mg | ORAL_TABLET | Freq: Two times a day (BID) | ORAL | 5 refills | Status: DC

## 2018-06-02 NOTE — Telephone Encounter (Signed)
Pt last saw Dr Curt Bears 01/08/18, last labs 11/27/17 Creat 0.90, age 59, weight 109kg, based on specified criteria pt is on appropriate dosage of Eliquis 5mg  BId.  Will refill rx.

## 2018-06-04 ENCOUNTER — Ambulatory Visit (INDEPENDENT_AMBULATORY_CARE_PROVIDER_SITE_OTHER): Payer: BLUE CROSS/BLUE SHIELD | Admitting: Internal Medicine

## 2018-06-04 ENCOUNTER — Encounter: Payer: Self-pay | Admitting: Internal Medicine

## 2018-06-04 VITALS — BP 124/76 | HR 72 | Temp 97.0°F | Resp 18 | Ht 65.5 in | Wt 240.8 lb

## 2018-06-04 DIAGNOSIS — E782 Mixed hyperlipidemia: Secondary | ICD-10-CM

## 2018-06-04 DIAGNOSIS — I10 Essential (primary) hypertension: Secondary | ICD-10-CM

## 2018-06-04 DIAGNOSIS — Z79899 Other long term (current) drug therapy: Secondary | ICD-10-CM

## 2018-06-04 DIAGNOSIS — E039 Hypothyroidism, unspecified: Secondary | ICD-10-CM

## 2018-06-04 DIAGNOSIS — R7303 Prediabetes: Secondary | ICD-10-CM | POA: Diagnosis not present

## 2018-06-04 DIAGNOSIS — Z6839 Body mass index (BMI) 39.0-39.9, adult: Secondary | ICD-10-CM

## 2018-06-04 DIAGNOSIS — I480 Paroxysmal atrial fibrillation: Secondary | ICD-10-CM

## 2018-06-04 DIAGNOSIS — G4700 Insomnia, unspecified: Secondary | ICD-10-CM

## 2018-06-04 DIAGNOSIS — E559 Vitamin D deficiency, unspecified: Secondary | ICD-10-CM | POA: Diagnosis not present

## 2018-06-04 MED ORDER — TRAZODONE HCL 150 MG PO TABS: ORAL_TABLET | ORAL | 1 refills | Status: DC

## 2018-06-04 MED ORDER — PHENTERMINE HCL 37.5 MG PO TABS: ORAL_TABLET | ORAL | 5 refills | Status: DC

## 2018-06-04 MED ORDER — TOPIRAMATE 50 MG PO TABS: ORAL_TABLET | ORAL | 1 refills | Status: DC

## 2018-06-04 NOTE — Patient Instructions (Signed)

## 2018-06-04 NOTE — Progress Notes (Signed)
This very nice 58 y.o. DWF presents for 3 month follow up with HTN, HLD, Pre-Diabetes and Vitamin D Deficiency.      Patient is treated for HTN & BP has been controlled at home. Today's BP is at goal - 124/76. Patient has long hx/o pAfib (CHADsVASc=2) and had an ablation by Dr Curt Bears in Sept 2017 and a repeat in Oct  2018 and 2sd repeat ablation in Dec 2018. Patient has had no complaints of any cardiac type chest pain, palpitations, dyspnea / orthopnea / PND, dizziness, claudication, or dependent edema.     Hyperlipidemia is not controlled with diet & patient has been very resistant to take Chol meds in the past. Last Lipids were not at goal: Lab Results  Component Value Date   CHOL 231 (H) 11/27/2017   HDL 45 (L) 11/27/2017   LDLCALC 149 (H) 11/27/2017   TRIG 228 (H) 11/27/2017   CHOLHDL 5.1 (H) 11/27/2017      Also, the patient has Morbid Obesity (BMI 39+) and is monitored for glucose intolerance / Prediabetes and has had no symptoms of reactive hypoglycemia, diabetic polys, paresthesias or visual blurring. She does indicate a willingness to retry Phentermine & Topiramate at low dose for her Obesity. Last A1c was not at goal: Lab Results  Component Value Date   HGBA1C 5.7 (H) 11/27/2017      Patient has been on Thyroid replacement since 2014.      Further, the patient also has history of Vitamin D Deficiency and supplements vitamin D without any suspected side-effects. Last vitamin D was at goal: Lab Results  Component Value Date   VD25OH 58 11/27/2017   Current Outpatient Medications on File Prior to Visit  Medication Sig  . apixaban (ELIQUIS) 5 MG TABS tablet Take 1 tablet (5 mg total) by mouth 2 (two) times daily.  Marland Kitchen augmented betamethasone dipropionate (DIPROLENE-AF) 0.05 % cream Apply 1 application topically daily as needed (for psoriasis). Apply very sparingly to Psoriasis rash (Do NOT use on face ! )  . Calcium 600-400 MG-UNIT CHEW Chew 1 each by mouth 2 (two) times  daily.   . Cholecalciferol (VITAMIN D PO) Take 10,000 Units by mouth daily.   . Coenzyme Q10 (CO Q10) 100 MG CAPS Take 100 mg by mouth daily.   Marland Kitchen diltiazem (CARDIZEM CD) 120 MG 24 hr capsule Take 1 capsule (120 mg total) by mouth daily.  Marland Kitchen ezetimibe (ZETIA) 10 MG tablet Take 1 tablet Daily  for Cholestrerol  . flecainide (TAMBOCOR) 100 MG tablet TAKE 1 TABLET BY MOUTH TWICE A DAY  . furosemide (LASIX) 20 MG tablet TAKE 1 TABLET BY MOUTH AS NEEDED FOR 3-5LB WEIGHT GAIN, INCREASED SWELLING  . levothyroxine (SYNTHROID, LEVOTHROID) 50 MCG tablet TAKE 1 TABLET (50 MCG TOTAL) BY MOUTH DAILY.  Marland Kitchen loratadine (CLARITIN) 10 MG tablet TAKE 10 MG BY MOUTH DAILY AS NEEDED FOR ALLERGY RELIEF  . losartan (COZAAR) 25 MG tablet TAKE 1 TABLET BY MOUTH EVERY DAY *STOPPING LISINOPRIL*  . Magnesium 500 MG TABS Take 500 mg by mouth every evening.   . montelukast (SINGULAIR) 10 MG tablet TAKE 1 TABLET BY MOUTH EVERY DAY  . Multiple Vitamin (MULTIVITAMIN WITH MINERALS) TABS tablet Take 1 tablet by mouth daily.  . NONFORMULARY OR COMPOUNDED ITEM Place 1 each vaginally See admin instructions. Estriol/testosterone 0.25-0.25 vaginal suppository compound. Insert vaginally on Sunday, Monday and Wednesday  . NONFORMULARY OR COMPOUNDED ITEM Apply 1 each topically See admin instructions. Progesterone 8%/17ml Apply  0.56mls topically every day except Saturday.  Marland Kitchen OVER THE COUNTER MEDICATION Taking Ferrous Sulfate 325 mg daily.  Vladimir Faster Glycol-Propyl Glycol (SYSTANE OP) Place 1 drop into both eyes daily.  . Probiotic Product (PROBIOTIC DAILY PO) Take 1 tablet by mouth daily.  . traMADol (ULTRAM) 50 MG tablet Take 1/2 to 1 tablet every 4 hours  as needed for pain  . Turmeric 500 MG CAPS Take 500 mg by mouth 2 (two) times daily.   No current facility-administered medications on file prior to visit.    Allergies  Allergen Reactions  . Lipitor [Atorvastatin] Other (See Comments)    "neck pain"  . Metoprolol Other (See  Comments)    DIDN'T WORK FOR PATIENT AND GAINED WEIGHT MIGHT HAVE BEEN TAKEN WITH RYTHMOL  . Rythmol [Propafenone] Other (See Comments)    "weight gain"  . Sulfa Drugs Cross Reactors Other (See Comments)    "soreness all over"   PMHx:   Past Medical History:  Diagnosis Date  . Arthritis    "probably in my knees; maybe in my hands" (04/11/2017)  . Asthma attack 10/2015 X 1  . Atrial flutter ([place]) 10/2015  . Heart murmur    "comes and goes" (04/11/2017)  . Hepatitis 1972   "don't know what kind"  . High cholesterol   . Hypertension   . Hypothyroidism   . Malignant melanoma of leg ([place])    "right thigh"  . PAF (paroxysmal atrial fibrillation) ([place])   . Pneumonia 1966; 1967   "left lung collapsed one of these times"  . Psoriasis   . Rosacea   . Vitamin D deficiency    Immunization History  Administered Date(s) Administered  . PPD Test 12/07/2013, 12/22/2014, 08/17/2016, 11/27/2017  . Tdap 06/23/2015   Past Surgical History:  Procedure Laterality Date  . A-FLUTTER ABLATION N/A 02/08/2017   Procedure: A-Flutter Ablation;  Surgeon: Constance Haw, MD;  Location: [place] [facility];  Service: Cardiovascular;  Laterality: N/A;  . ATRIAL FIBRILLATION ABLATION N/A 04/11/2017   Procedure: ATRIAL FIBRILLATION ABLATION;  Surgeon: Constance Haw, MD;  Location: [place] [facility];  Service: Cardiovascular;  Laterality: N/A;  . CARDIOVERSION N/A 10/25/2015   Procedure: CARDIOVERSION;  Surgeon: Sueanne Margarita, MD;  Location: [place];  Service: Cardiovascular;  Laterality: N/A;  . [facility]  . CESAREAN SECTION WITH BILATERAL TUBAL LIGATION  1982  . ELECTROPHYSIOLOGIC STUDY N/A 01/13/2016   Procedure: Atrial Fibrillation Ablation;  Surgeon: Will Meredith Leeds, MD;  Location: [place] [facility];  Service: Cardiovascular;  Laterality: N/A;  . ENDOMETRIAL ABLATION  ~ 2012  . MELANOMA EXCISION Right 10/1975   outer thigh "malignant"  . REFRACTIVE SURGERY  Bilateral 1990s  . TEE WITHOUT CARDIOVERSION N/A 10/25/2015   Procedure: TRANSESOPHAGEAL ECHOCARDIOGRAM (TEE);  Surgeon: Sueanne Margarita, MD;  Location: [facility];  Service: Cardiovascular;  Laterality: N/A;  . TEE WITHOUT CARDIOVERSION N/A 01/11/2016   Procedure: TRANSESOPHAGEAL ECHOCARDIOGRAM (TEE);  Surgeon: Josue Hector, MD;  Location: [place];  Service: Cardiovascular;  Laterality: N/A;  . TONSILLECTOMY  1967  . TUBAL LIGATION     FHx:    Reviewed / unchanged  SHx:    Reviewed / unchanged   Systems Review:  Constitutional: Denies fever, chills, wt changes, headaches, insomnia, fatigue, night sweats, change in appetite. Eyes: Denies redness, blurred vision, diplopia, discharge, itchy, watery eyes.  ENT: Denies discharge, congestion, post nasal drip, epistaxis, sore throat, earache, hearing loss, dental pain, tinnitus, vertigo, sinus  pain, snoring.  CV: Denies chest pain, palpitations, irregular heartbeat, syncope, dyspnea, diaphoresis, orthopnea, PND, claudication or edema. Respiratory: denies cough, dyspnea, DOE, pleurisy, hoarseness, laryngitis, wheezing.  Gastrointestinal: Denies dysphagia, odynophagia, heartburn, reflux, water brash, abdominal pain or cramps, nausea, vomiting, bloating, diarrhea, constipation, hematemesis, melena, hematochezia  or hemorrhoids. Genitourinary: Denies dysuria, frequency, urgency, nocturia, hesitancy, discharge, hematuria or flank pain. Musculoskeletal: Denies arthralgias, myalgias, stiffness, jt. swelling, pain, limping or strain/sprain.  Skin: Denies pruritus, rash, hives, warts, acne, eczema or change in skin lesion(s). Neuro: No weakness, tremor, incoordination, spasms, paresthesia or pain. Psychiatric: Denies confusion, memory loss or sensory loss. Endo: Denies change in weight, skin or hair change.  Heme/Lymph: No excessive bleeding, bruising or enlarged lymph nodes.  Physical Exam  BP 124/76   Pulse 72   Temp (!) 97 F (36.1 C)    Resp 18   Ht 5' 5.5" (1.664 m)   Wt 240 lb 12.8 oz (109.2 kg)   LMP 10/29/2013 (Approximate)   BMI 39.46 kg/m   Appears  well nourished, well groomed  and in no distress.  Eyes: PERRLA, EOMs, conjunctiva no swelling or erythema. Sinuses: No frontal/maxillary tenderness ENT/Mouth: EAC's clear, TM's nl w/o erythema, bulging. Nares clear w/o erythema, swelling, exudates. Oropharynx clear without erythema or exudates. Oral hygiene is good. Tongue normal, non obstructing. Hearing intact.  Neck: Supple. Thyroid not palpable. Car 2+/2+ without bruits, nodes or JVD. Chest: Respirations nl with BS clear & equal w/o rales, rhonchi, wheezing or stridor.  Cor: Heart sounds normal w/ regular rate and rhythm without sig. murmurs, gallops, clicks or rubs. Peripheral pulses normal and equal  without edema.  Abdomen: Soft & bowel sounds normal. Non-tender w/o guarding, rebound, hernias, masses or organomegaly.  Lymphatics: Unremarkable.  Musculoskeletal: Full ROM all peripheral extremities, joint stability, 5/5 strength and normal gait.  Skin: Warm, dry without exposed rashes, lesions or ecchymosis apparent.  Neuro: Cranial nerves intact, reflexes equal bilaterally. Sensory-motor testing grossly intact. Tendon reflexes grossly intact.  Pysch: Alert & oriented x 3.  Insight and judgement nl & appropriate. No ideations.  Assessment and Plan:  1. Essential hypertension  - Continue medication, monitor blood pressure at home.  - Continue DASH diet.  Reminder to go to the ER if any CP,  SOB, nausea, dizziness, severe HA, changes vision/speech.  - CBC with Differential/Platelet - COMPLETE METABOLIC PANEL WITH GFR - Magnesium  2. Hyperlipidemia, mixed  - Continue diet/meds, exercise,& lifestyle modifications.  - Continue monitor periodic cholesterol/liver & renal functions   - Lipid panel  3. Prediabetes  - Continue diet, exercise  - Lifestyle modifications.  - Monitor appropriate labs.  -  Hemoglobin A1c - Insulin, random  4. Vitamin D deficiency  - Continue supplementation.  - VITAMIN D 25 Hydroxyl  5. Hypothyroidism  6. Paroxysmal atrial fibrillation (HCC)  7. Class 2 severe obesity due to excess calories with serious comorbidity and body mass index (BMI) of 39.0 to 39.9 in adult (HCC)  - topiramate (TOPAMAX) 50 MG tablet; Take 1/2 to 1 tablet  2 x /day at Suppertime & Bedtime for Dieting & Weight loss  Dispense: 180 tablet; Refill: 1 - phentermine (ADIPEX-P) 37.5 MG tablet; Take 1/2 to 1 tablet every morning for Dieting & Weight  Loss  Dispense: 30 tablet; Refill: 5  8. Insomnia,  - traZODone (DESYREL) 150 MG tablet; Take 1/2 to 1 tablet 1 hour before Sleep  Dispense: 90 tablet; Refill: 1  9. Medication management  - CBC with Differential/Platelet - COMPLETE  METABOLIC PANEL WITH GFR - Magnesium - Lipid panel - Hemoglobin A1c - Insulin, random - VITAMIN D 25 Hydroxyl        Discussed  regular exercise, BP monitoring, weight control to achieve/maintain BMI less than 25 and discussed med and SE's. Recommended labs to assess and monitor clinical status with further disposition pending results of labs. Over 30 minutes of exam, counseling, chart review was performed.

## 2018-06-05 LAB — INSULIN, RANDOM: Insulin: 35.2 u[IU]/mL — ABNORMAL HIGH

## 2018-06-05 LAB — LIPID PANEL
Cholesterol: 170 mg/dL (ref <200)
HDL: 47 mg/dL — ABNORMAL LOW (ref >=50)
LDL Cholesterol (Calc): 89 mg/dL (calc)
Non-HDL Cholesterol (Calc): 123 mg/dL (calc) (ref <130)
Total CHOL/HDL Ratio: 3.6 (calc) (ref <5.0)
Triglycerides: 244 mg/dL — ABNORMAL HIGH (ref <150)

## 2018-06-05 LAB — CBC WITH DIFFERENTIAL/PLATELET
Absolute Monocytes: 784 cells/uL (ref 200–950)
Basophils Absolute: 78 cells/uL (ref 0–200)
Basophils Relative: 0.7 %
Eosinophils Absolute: 190 cells/uL (ref 15–500)
Eosinophils Relative: 1.7 %
HCT: 40 % (ref 35.0–45.0)
Hemoglobin: 13.6 g/dL (ref 11.7–15.5)
Lymphs Abs: 3550 cells/uL (ref 850–3900)
MCH: 29.5 pg (ref 27.0–33.0)
MCHC: 34 g/dL (ref 32.0–36.0)
MCV: 86.8 fL (ref 80.0–100.0)
MPV: 10.3 fL (ref 7.5–12.5)
Monocytes Relative: 7 %
Neutro Abs: 6597 cells/uL (ref 1500–7800)
Neutrophils Relative %: 58.9 %
Platelets: 337 10*3/uL (ref 140–400)
RBC: 4.61 10*6/uL (ref 3.80–5.10)
RDW: 13.2 % (ref 11.0–15.0)
Total Lymphocyte: 31.7 %
WBC: 11.2 10*3/uL — ABNORMAL HIGH (ref 3.8–10.8)

## 2018-06-05 LAB — COMPLETE METABOLIC PANEL WITH GFR
AG Ratio: 2.1 (calc) (ref 1.0–2.5)
ALT: 14 U/L (ref 6–29)
AST: 14 U/L (ref 10–35)
Albumin: 4.8 g/dL (ref 3.6–5.1)
Alkaline phosphatase (APISO): 53 U/L (ref 37–153)
BUN: 20 mg/dL (ref 7–25)
CO2: 29 mmol/L (ref 20–32)
Calcium: 9.9 mg/dL (ref 8.6–10.4)
Chloride: 101 mmol/L (ref 98–110)
Creat: 0.85 mg/dL (ref 0.50–1.05)
GFR, Est African American: 87 mL/min/{1.73_m2} (ref >=60)
GFR, Est Non African American: 75 mL/min/{1.73_m2} (ref >=60)
Globulin: 2.3 g/dL (calc) (ref 1.9–3.7)
Glucose, Bld: 95 mg/dL (ref 65–99)
Potassium: 4.5 mmol/L (ref 3.5–5.3)
Sodium: 139 mmol/L (ref 135–146)
Total Bilirubin: 0.4 mg/dL (ref 0.2–1.2)
Total Protein: 7.1 g/dL (ref 6.1–8.1)

## 2018-06-05 LAB — HEMOGLOBIN A1C
Hgb A1c MFr Bld: 5.5 % of total Hgb (ref <5.7)
Mean Plasma Glucose: 111 (calc)
eAG (mmol/L): 6.2 (calc)

## 2018-06-05 LAB — MAGNESIUM: Magnesium: 2.4 mg/dL (ref 1.5–2.5)

## 2018-06-05 LAB — VITAMIN D 25 HYDROXY (VIT D DEFICIENCY, FRACTURES): Vit D, 25-Hydroxy: 71 ng/mL (ref 30–100)

## 2018-06-17 ENCOUNTER — Encounter: Payer: Self-pay | Admitting: Cardiology

## 2018-06-17 ENCOUNTER — Ambulatory Visit (INDEPENDENT_AMBULATORY_CARE_PROVIDER_SITE_OTHER): Payer: BLUE CROSS/BLUE SHIELD | Admitting: Cardiology

## 2018-06-17 VITALS — BP 124/64 | HR 71 | Ht 65.5 in | Wt 238.0 lb

## 2018-06-17 DIAGNOSIS — I484 Atypical atrial flutter: Secondary | ICD-10-CM

## 2018-06-17 NOTE — Progress Notes (Signed)
Electrophysiology Office Note   Date:  06/17/2018   ID:  Monica Morgan, DOB Apr 17, 1960, MRN [id number]  PCP:  Unk Pinto, MD  Cardiologist:  Radford Pax Primary Electrophysiologist:  Quanika Solem Meredith Leeds, MD    No chief complaint on file.    History of Present Illness: Monica Morgan is a 58 y.o. female who presents today for electrophysiology evaluation.   Recent admission for tikosyn loading.  Had TEE/CV which restored sinus rhythm.  TEE noted EF 35-40%.  Discharged in sinus rhythm.  Continuing to have AF symptoms. Had AF ablation 01/13/16. Taken off tikosyn 12/17. She is felt well since last being seen. She presented to clinic in atrial flutter. She was in and out of atrial flutter and thus no cardioversion was performed.  Patient for typical atrial flutter on 03/11/17.  During the procedure, patient also had left atrial arrhythmias.  She had a repeat ablation on 04/11/17 with extensive left atrial ablation for multiple atypical atrial flutter's.  Required cardioversion to return to sinus rhythm.  She was discharged on flecainide.  Today, denies symptoms of palpitations, chest pain, shortness of breath, orthopnea, PND, lower extremity edema, claudication, dizziness, presyncope, syncope, bleeding, or neurologic sequela. The patient is tolerating medications without difficulties.  Overall she is doing well.  She has noted no further episodes of atrial fibrillation or atrial flutter.  She is wishing to come off of some of her medications.   Past Medical History:  Diagnosis Date  . Arthritis    "probably in my knees; maybe in my hands" (04/11/2017)  . Asthma attack 10/2015 X 1  . Atrial flutter ([place]) 10/2015  . Heart murmur    "comes and goes" (04/11/2017)  . Hepatitis 1972   "don't know what kind"  . High cholesterol   . Hypertension   . Hypothyroidism   . Malignant melanoma of leg ([place])    "right thigh"  . PAF (paroxysmal atrial fibrillation) ([place])   . Pneumonia 1966; 1967   "left lung collapsed one of these times"  . Psoriasis   . Rosacea   . Vitamin D deficiency    Past Surgical History:  Procedure Laterality Date  . A-FLUTTER ABLATION N/A 02/08/2017   Procedure: A-Flutter Ablation;  Surgeon: Constance Haw, MD;  Location: [place] [facility];  Service: Cardiovascular;  Laterality: N/A;  . ATRIAL FIBRILLATION ABLATION N/A 04/11/2017   Procedure: ATRIAL FIBRILLATION ABLATION;  Surgeon: Constance Haw, MD;  Location: [place] [facility];  Service: Cardiovascular;  Laterality: N/A;  . CARDIOVERSION N/A 10/25/2015   Procedure: CARDIOVERSION;  Surgeon: Sueanne Margarita, MD;  Location: [place];  Service: Cardiovascular;  Laterality: N/A;  . [place]  . CESAREAN SECTION WITH BILATERAL TUBAL LIGATION  1982  . ELECTROPHYSIOLOGIC STUDY N/A 01/13/2016   Procedure: Atrial Fibrillation Ablation;  Surgeon: Camey Edell Meredith Leeds, MD;  Location: [place] [facility];  Service: Cardiovascular;  Laterality: N/A;  . ENDOMETRIAL ABLATION  ~ 2012  . MELANOMA EXCISION Right 10/1975   outer thigh "malignant"  . REFRACTIVE SURGERY Bilateral 1990s  . TEE WITHOUT CARDIOVERSION N/A 10/25/2015   Procedure: TRANSESOPHAGEAL ECHOCARDIOGRAM (TEE);  Surgeon: Sueanne Margarita, MD;  Location: [facility];  Service: Cardiovascular;  Laterality: N/A;  . TEE WITHOUT CARDIOVERSION N/A 01/11/2016   Procedure: TRANSESOPHAGEAL ECHOCARDIOGRAM (TEE);  Surgeon: Josue Hector, MD;  Location: [place];  Service: Cardiovascular;  Laterality: N/A;  . TONSILLECTOMY  1967  . TUBAL LIGATION       Current Outpatient  Medications  Medication Sig Dispense Refill  . apixaban (ELIQUIS) 5 MG TABS tablet Take 1 tablet (5 mg total) by mouth 2 (two) times daily. 60 tablet 5  . augmented betamethasone dipropionate (DIPROLENE-AF) 0.05 % cream Apply 1 application topically daily as needed (for psoriasis). Apply very sparingly to Psoriasis rash (Do NOT use on face ! ) 50 g 5  . Calcium 600-400  MG-UNIT CHEW Chew 1 each by mouth 2 (two) times daily.     . Cholecalciferol (VITAMIN D PO) Take 10,000 Units by mouth daily.     . Coenzyme Q10 (CO Q10) 100 MG CAPS Take 100 mg by mouth daily.     Marland Kitchen diltiazem (CARDIZEM CD) 120 MG 24 hr capsule Take 1 capsule (120 mg total) by mouth daily. 90 capsule 3  . ezetimibe (ZETIA) 10 MG tablet Take 1 tablet Daily  for Cholestrerol 90 tablet 3  . flecainide (TAMBOCOR) 100 MG tablet TAKE 1 TABLET BY MOUTH TWICE A DAY 60 tablet 10  . furosemide (LASIX) 20 MG tablet TAKE 1 TABLET BY MOUTH AS NEEDED FOR 3-5LB WEIGHT GAIN, INCREASED SWELLING 90 tablet 1  . levothyroxine (SYNTHROID, LEVOTHROID) 50 MCG tablet TAKE 1 TABLET (50 MCG TOTAL) BY MOUTH DAILY. 90 tablet 1  . loratadine (CLARITIN) 10 MG tablet TAKE 10 MG BY MOUTH DAILY AS NEEDED FOR ALLERGY RELIEF  0  . losartan (COZAAR) 25 MG tablet TAKE 1 TABLET BY MOUTH EVERY DAY *STOPPING LISINOPRIL* 90 tablet 3  . Magnesium 500 MG TABS Take 500 mg by mouth every evening.     . montelukast (SINGULAIR) 10 MG tablet TAKE 1 TABLET BY MOUTH EVERY DAY 90 tablet 0  . Multiple Vitamin (MULTIVITAMIN WITH MINERALS) TABS tablet Take 1 tablet by mouth daily.    . NONFORMULARY OR COMPOUNDED ITEM Place 1 each vaginally See admin instructions. Estriol/testosterone 0.25-0.25 vaginal suppository compound. Insert vaginally on Sunday, Monday and Wednesday    . NONFORMULARY OR COMPOUNDED ITEM Apply 1 each topically See admin instructions. Progesterone 8%/31ml Apply 0.2mls topically every day except Saturday.    Marland Kitchen OVER THE COUNTER MEDICATION Taking Ferrous Sulfate 325 mg daily.    Vladimir Faster Glycol-Propyl Glycol (SYSTANE OP) Place 1 drop into both eyes daily.    . Probiotic Product (PROBIOTIC DAILY PO) Take 1 tablet by mouth daily.    . traMADol (ULTRAM) 50 MG tablet Take 1/2 to 1 tablet every 4 hours  as needed for pain 30 tablet 0  . Turmeric 500 MG CAPS Take 500 mg by mouth 2 (two) times daily.     No current  facility-administered medications for this visit.     Allergies:   Lipitor [atorvastatin]; Metoprolol; Phentermine; Rythmol [propafenone]; and Sulfa drugs cross reactors   Social History:  The patient  reports that she has never smoked. She has never used smokeless tobacco. She reports current alcohol use. She reports that she does not use drugs.   Family History:  The patient's family history includes Diabetes in her father; Heart attack in her brother and mother; Heart disease in her father and mother; Hypertension in her father and mother.   ROS:  Please see the history of present illness.   Otherwise, review of systems is positive for back pain, psoriasis.   All other systems are reviewed and negative.   PHYSICAL EXAM: VS:  BP 124/64   Pulse 71   Ht 5' 5.5" (1.664 m)   Wt 238 lb (108 kg)   LMP 10/29/2013 (Approximate)  BMI 39.00 kg/m  , BMI Body mass index is 39 kg/m. GEN: Well nourished, well developed, in no acute distress  HEENT: normal  Neck: no JVD, carotid bruits, or masses Cardiac: RRR; no murmurs, rubs, or gallops,no edema  Respiratory:  clear to auscultation bilaterally, normal work of breathing GI: soft, nontender, nondistended, + BS MS: no deformity or atrophy  Skin: warm and dry Neuro:  Strength and sensation are intact Psych: euthymic mood, full affect  EKG:  EKG is ordered today. Personal review of the ekg ordered shows sinus rhythm, rate 71  Recent Labs: 03/25/2018: TSH 2.010 06/04/2018: ALT 14; BUN 20; Creat 0.85; Hemoglobin 13.6; Magnesium 2.4; Platelets 337; Potassium 4.5; Sodium 139    Lipid Panel     Component Value Date/Time   CHOL 170 06/04/2018 1529   TRIG 244 (H) 06/04/2018 1529   HDL 47 (L) 06/04/2018 1529   CHOLHDL 3.6 06/04/2018 1529   VLDL 46 (H) 08/16/2016 1647   LDLCALC 89 06/04/2018 1529     Wt Readings from Last 3 Encounters:  06/17/18 238 lb (108 kg)  06/04/18 240 lb 12.8 oz (109.2 kg)  03/25/18 240 lb 6.4 oz (109 kg)       Other studies Reviewed: Additional studies/ records that were reviewed today include: TTE 12/19/16 Review of the above records today demonstrates:  - Left ventricle: The cavity size was normal. Wall thickness was   normal. Systolic function was normal. The estimated ejection   fraction was in the range of 55% to 60%. Wall motion was normal;   there were no regional wall motion abnormalities. Features are   consistent with a pseudonormal left ventricular filling pattern,   with concomitant abnormal relaxation and increased filling   pressure (grade 2 diastolic dysfunction). - Aortic valve: There was no stenosis. - Mitral valve: There was mild regurgitation. - Left atrium: The atrium was mildly dilated. - Right ventricle: The cavity size was normal. Systolic function   was normal. - Right atrium: The atrium was mildly dilated. - Tricuspid valve: Peak RV-RA gradient (S): 32 mm Hg. - Pulmonary arteries: PA peak pressure: 35 mm Hg (S). - Inferior vena cava: The vessel was normal in size. The   respirophasic diameter changes were in the normal range (>= 50%),   consistent with normal central venous pressure.   ASSESSMENT AND PLAN:  1.  Persistent atrial fibrillation: Currently on Eliquis, flecainide, and diltiazem.  She is status post AF/flutter ablation 04/10/2017.  She has had no further episodes of tachyarrhythmias.  We Abhay Godbolt plan to stop flecainide today.  Continue diltiazem.  This patients CHA2DS2-VASc Score and unadjusted Ischemic Stroke Rate (% per year) is equal to 2.2 % stroke rate/year from a score of 2  Above score calculated as 1 point each if present [CHF, HTN, DM, Vascular=MI/PAD/Aortic Plaque, Age if 65-74, or Female] Above score calculated as 2 points each if present [Age > 75, or Stroke/TIA/TE]   2. Hypertension: Well-controlled today.  She would like to try being off of her blood pressure medications.  We Rayshard Schirtzinger stop losartan.  She Lorae Roig take her blood pressure for the  next 10 days and if her blood pressure is above 130, Kristofor Michalowski restart.  3. Mild OSA: Currently not on CPAP  4. Atrial flutter, typical: Status post ablation  5.  Morbid obesity: Working with diet and exercise.  Current medicines are reviewed at length with the patient today.   The patient does not have concerns regarding her medicines.  The following  changes were made today: Stop flecainide, stop losartan  Labs/ tests ordered today include:  Orders Placed This Encounter  Procedures  . EKG 12-Lead     Disposition:   FU with Inika Bellanger 6 months  Signed, Mashonda Broski Meredith Leeds, MD  06/17/2018 4:49 PM     [facility] [address] [place]  [id number] [phone] (office) [phone] (fax)

## 2018-06-17 NOTE — Addendum Note (Signed)
Addended by: Stanton Kidney on: 06/17/2018 04:52 PM   Modules accepted: Orders

## 2018-06-17 NOTE — Patient Instructions (Addendum)
Medication Instructions:  Your physician has recommended you make the following change in your medication:  1. STOP Flecainide 2. STOP Losartan  * If you need a refill on your cardiac medications before your next appointment, please call your pharmacy.   Labwork: None ordered  Testing/Procedures: None ordered  Follow-Up: Your physician wants you to follow-up in: 6 months with Dr. Curt Bears.  You will receive a reminder letter in the mail two months in advance. If you don't receive a letter, please call our office to schedule the follow-up appointment.   Thank you for choosing [facility]!!   Trinidad Curet, RN [phone]

## 2018-06-18 ENCOUNTER — Other Ambulatory Visit: Payer: Self-pay | Admitting: Physician Assistant

## 2018-06-29 ENCOUNTER — Other Ambulatory Visit: Payer: Self-pay | Admitting: Physician Assistant

## 2018-06-29 ENCOUNTER — Other Ambulatory Visit: Payer: Self-pay | Admitting: Internal Medicine

## 2018-06-30 ENCOUNTER — Telehealth: Payer: Self-pay

## 2018-06-30 MED ORDER — LOSARTAN POTASSIUM 25 MG PO TABS: 25.0000 mg | ORAL_TABLET | Freq: Every day | ORAL | 1 refills | Status: DC

## 2018-06-30 MED ORDER — FLECAINIDE ACETATE 100 MG PO TABS: 100.0000 mg | ORAL_TABLET | Freq: Two times a day (BID) | ORAL | 3 refills | Status: DC

## 2018-06-30 NOTE — Telephone Encounter (Signed)
Patient Monica Morgan(DOB;17-Dec-1960) called the office requesting to speak to Dr. Curt Bears or his nurse about getting back on Flecainide and Losartan because she reports having A fib and her blood pressure being above 140. Please advice. Her phone number is [phone].

## 2018-06-30 NOTE — Telephone Encounter (Signed)
Reviewed w/ Dr Curt Bears Advised pt to restart Losartan 25 mg daily for blood pressure. Advised to restart Flecainide 100 mg BID. She will call office back if medication not working of having issues w/ AFib/BP. Patient verbalized understanding and agreeable to plan.

## 2018-07-02 ENCOUNTER — Other Ambulatory Visit: Payer: Self-pay | Admitting: Internal Medicine

## 2018-10-25 ENCOUNTER — Other Ambulatory Visit: Payer: Self-pay | Admitting: Cardiology

## 2018-11-03 ENCOUNTER — Other Ambulatory Visit: Payer: Self-pay | Admitting: Internal Medicine

## 2018-11-20 ENCOUNTER — Other Ambulatory Visit: Payer: Self-pay | Admitting: Pharmacist

## 2018-11-20 MED ORDER — APIXABAN 5 MG PO TABS: 5.0000 mg | ORAL_TABLET | Freq: Two times a day (BID) | ORAL | 5 refills | Status: DC

## 2018-11-20 NOTE — Progress Notes (Signed)
Age 58, weight 108kg, SCr 0.85, last OV March 2020, afib indication

## 2018-12-23 ENCOUNTER — Other Ambulatory Visit: Payer: Self-pay | Admitting: Internal Medicine

## 2018-12-23 DIAGNOSIS — Z1231 Encounter for screening mammogram for malignant neoplasm of breast: Secondary | ICD-10-CM

## 2018-12-24 DIAGNOSIS — M546 Pain in thoracic spine: Secondary | ICD-10-CM | POA: Diagnosis not present

## 2018-12-24 DIAGNOSIS — M9902 Segmental and somatic dysfunction of thoracic region: Secondary | ICD-10-CM | POA: Diagnosis not present

## 2018-12-24 DIAGNOSIS — M9901 Segmental and somatic dysfunction of cervical region: Secondary | ICD-10-CM | POA: Diagnosis not present

## 2018-12-24 DIAGNOSIS — M542 Cervicalgia: Secondary | ICD-10-CM | POA: Diagnosis not present

## 2018-12-31 ENCOUNTER — Encounter: Payer: Self-pay | Admitting: Internal Medicine

## 2019-01-01 ENCOUNTER — Other Ambulatory Visit: Payer: Self-pay

## 2019-01-01 ENCOUNTER — Ambulatory Visit (INDEPENDENT_AMBULATORY_CARE_PROVIDER_SITE_OTHER): Payer: BC Managed Care – PPO | Admitting: Cardiology

## 2019-01-01 ENCOUNTER — Encounter: Payer: Self-pay | Admitting: Cardiology

## 2019-01-01 VITALS — BP 136/86 | HR 60 | Ht 65.5 in | Wt 249.0 lb

## 2019-01-01 DIAGNOSIS — I4819 Other persistent atrial fibrillation: Secondary | ICD-10-CM | POA: Diagnosis not present

## 2019-01-01 NOTE — Progress Notes (Signed)
Electrophysiology Office Note   Date:  01/01/2019   ID:  Monica Morgan, DOB [birth year], MRN [id number]  PCP:  Unk Pinto, MD  Cardiologist:  Radford Pax Primary Electrophysiologist:  Minal Stuller Meredith Leeds, MD    No chief complaint on file.    History of Present Illness: Monica Morgan is a 58 y.o. female who presents today for electrophysiology evaluation.   Recent admission for tikosyn loading.  Had TEE/CV which restored sinus rhythm.  TEE noted EF 35-40%.  Discharged in sinus rhythm.  Continuing to have AF symptoms. Had AF ablation 01/13/16. Taken off tikosyn 12/17. She is felt well since last being seen. She presented to clinic in atrial flutter. She was in and out of atrial flutter and thus no cardioversion was performed.  Patient for typical atrial flutter on 03/11/17.  During the procedure, patient also had left atrial arrhythmias.  She had a repeat ablation on 04/11/17 with extensive left atrial ablation for multiple atypical atrial flutter's.  Required cardioversion to return to sinus rhythm.  She was discharged on flecainide.  Today, denies symptoms of palpitations, chest pain, shortness of breath, orthopnea, PND, lower extremity edema, claudication, dizziness, presyncope, syncope, bleeding, or neurologic sequela. The patient is tolerating medications without difficulties.  Overall she is doing well.  She has no chest pain or shortness of breath she is noted no evidence of atrial fibrillation.  She has had a very stressful day and she thinks that her blood pressure is elevated due to that.   Past Medical History:  Diagnosis Date  . Arthritis    "probably in my knees; maybe in my hands" (04/11/2017)  . Asthma attack 10/2015 X 1  . Atrial flutter ([place]) 10/2015  . Heart murmur    "comes and goes" (04/11/2017)  . Hepatitis 1972   "don't know what kind"  . High cholesterol   . Hypertension   . Hypothyroidism   . Malignant melanoma of leg ([place])    "right thigh"  . PAF  (paroxysmal atrial fibrillation) ([place])   . Pneumonia 1966; 1967   "left lung collapsed one of these times"  . Psoriasis   . Rosacea   . Vitamin D deficiency    Past Surgical History:  Procedure Laterality Date  . A-FLUTTER ABLATION N/A 02/08/2017   Procedure: A-Flutter Ablation;  Surgeon: Constance Haw, MD;  Location: [facility];  Service: Cardiovascular;  Laterality: N/A;  . ATRIAL FIBRILLATION ABLATION N/A 04/11/2017   Procedure: ATRIAL FIBRILLATION ABLATION;  Surgeon: Constance Haw, MD;  Location: [place] [facility];  Service: Cardiovascular;  Laterality: N/A;  . CARDIOVERSION N/A 10/25/2015   Procedure: CARDIOVERSION;  Surgeon: Sueanne Margarita, MD;  Location: [facility];  Service: Cardiovascular;  Laterality: N/A;  . [place]  . CESAREAN SECTION WITH BILATERAL TUBAL LIGATION  1982  . ELECTROPHYSIOLOGIC STUDY N/A 01/13/2016   Procedure: Atrial Fibrillation Ablation;  Surgeon: Emelina Hinch Meredith Leeds, MD;  Location: [facility];  Service: Cardiovascular;  Laterality: N/A;  . ENDOMETRIAL ABLATION  ~ 2012  . MELANOMA EXCISION Right 10/1975   outer thigh "malignant"  . REFRACTIVE SURGERY Bilateral 1990s  . TEE WITHOUT CARDIOVERSION N/A 10/25/2015   Procedure: TRANSESOPHAGEAL ECHOCARDIOGRAM (TEE);  Surgeon: Sueanne Margarita, MD;  Location: [facility];  Service: Cardiovascular;  Laterality: N/A;  . TEE WITHOUT CARDIOVERSION N/A 01/11/2016   Procedure: TRANSESOPHAGEAL ECHOCARDIOGRAM (TEE);  Surgeon: Josue Hector, MD;  Location: [place];  Service: Cardiovascular;  Laterality: N/A;  . TONSILLECTOMY  1967  . TUBAL LIGATION       Current Outpatient Medications  Medication Sig Dispense Refill  . apixaban (ELIQUIS) 5 MG TABS tablet Take 1 tablet (5 mg total) by mouth 2 (two) times daily. 60 tablet 5  . augmented betamethasone dipropionate (DIPROLENE-AF) 0.05 % cream Apply 1 application topically daily as needed (for psoriasis). Apply very sparingly  to Psoriasis rash (Do NOT use on face ! ) 50 g 5  . Calcium 600-400 MG-UNIT CHEW Chew 1 each by mouth 2 (two) times daily.     . Cholecalciferol (VITAMIN D PO) Take 10,000 Units by mouth daily.     . Coenzyme Q10 (CO Q10) 100 MG CAPS Take 100 mg by mouth daily.     Marland Kitchen diltiazem (CARDIZEM CD) 120 MG 24 hr capsule Take 1 capsule (120 mg total) by mouth daily. 90 capsule 3  . ezetimibe (ZETIA) 10 MG tablet Take 1 tablet Daily  for Cholestrerol 90 tablet 3  . flecainide (TAMBOCOR) 100 MG tablet TAKE 1 TABLET BY MOUTH TWICE A DAY 180 tablet 1  . furosemide (LASIX) 20 MG tablet Take 1 tablet Daily for Fluid Retention 90 tablet 1  . levothyroxine (SYNTHROID) 50 MCG tablet Take 1 tablet daily on an empty stomach with only water for 30 minutes & no Antacid meds, Calcium or Magnesium for 4 hours & avoid Biotin 90 tablet 3  . loratadine (CLARITIN) 10 MG tablet TAKE 10 MG BY MOUTH DAILY AS NEEDED FOR ALLERGY RELIEF  0  . losartan (COZAAR) 25 MG tablet Take 1 tablet (25 mg total) by mouth daily. 90 tablet 1  . Magnesium 500 MG TABS Take 500 mg by mouth every evening.     . montelukast (SINGULAIR) 10 MG tablet Take 1 tablet Daily for Allergies 90 tablet 3  . Multiple Vitamin (MULTIVITAMIN WITH MINERALS) TABS tablet Take 1 tablet by mouth daily.    . NONFORMULARY OR COMPOUNDED ITEM Place 1 each vaginally See admin instructions. Estriol/testosterone 0.25-0.25 vaginal suppository compound. Insert vaginally on Sunday, Monday and Wednesday    . NONFORMULARY OR COMPOUNDED ITEM Apply 1 each topically See admin instructions. Progesterone 8%/23ml Apply 0.71mls topically every day except Saturday.    Marland Kitchen OVER THE COUNTER MEDICATION Taking Ferrous Sulfate 325 mg daily.    Vladimir Faster Glycol-Propyl Glycol (SYSTANE OP) Place 1 drop into both eyes daily.    . Probiotic Product (PROBIOTIC DAILY PO) Take 1 tablet by mouth daily.    . traMADol (ULTRAM) 50 MG tablet Take 1/2 to 1 tablet every 4 hours  as needed for pain 30 tablet 0   . Turmeric 500 MG CAPS Take 500 mg by mouth 2 (two) times daily.     No current facility-administered medications for this visit.     Allergies:   Lipitor [atorvastatin], Metoprolol, Phentermine, Rythmol [propafenone], and Sulfa drugs cross reactors   Social History:  The patient  reports that she has never smoked. She has never used smokeless tobacco. She reports current alcohol use. She reports that she does not use drugs.   Family History:  The patient's family history includes Diabetes in her father; Heart attack in her brother and mother; Heart disease in her father and mother; Hypertension in her father and mother.   ROS:  Please see the history of present illness.   Otherwise, review of systems is positive for none.   All other systems are reviewed and negative.   PHYSICAL EXAM: VS:  BP 136/86   Pulse 60  Ht 5' 5.5" (1.664 m)   Wt 249 lb (112.9 kg)   LMP 10/29/2013 (Approximate)   SpO2 95%   BMI 40.81 kg/m  , BMI Body mass index is 40.81 kg/m. GEN: Well nourished, well developed, in no acute distress  HEENT: normal  Neck: no JVD, carotid bruits, or masses Cardiac: RRR; no murmurs, rubs, or gallops,no edema  Respiratory:  clear to auscultation bilaterally, normal work of breathing GI: soft, nontender, nondistended, + BS MS: no deformity or atrophy  Skin: warm and dry Neuro:  Strength and sensation are intact Psych: euthymic mood, full affect  EKG:  EKG is ordered today. Personal review of the ekg ordered shows this rhythm, PVCs, rate 60  Recent Labs: 03/25/2018: TSH 2.010 06/04/2018: ALT 14; BUN 20; Creat 0.85; Hemoglobin 13.6; Magnesium 2.4; Platelets 337; Potassium 4.5; Sodium 139    Lipid Panel     Component Value Date/Time   CHOL 170 06/04/2018 1529   TRIG 244 (H) 06/04/2018 1529   HDL 47 (L) 06/04/2018 1529   CHOLHDL 3.6 06/04/2018 1529   VLDL 46 (H) 08/16/2016 1647   LDLCALC 89 06/04/2018 1529     Wt Readings from Last 3 Encounters:  01/01/19 249  lb (112.9 kg)  06/17/18 238 lb (108 kg)  06/04/18 240 lb 12.8 oz (109.2 kg)      Other studies Reviewed: Additional studies/ records that were reviewed today include: TTE 12/19/16 Review of the above records today demonstrates:  - Left ventricle: The cavity size was normal. Wall thickness was   normal. Systolic function was normal. The estimated ejection   fraction was in the range of 55% to 60%. Wall motion was normal;   there were no regional wall motion abnormalities. Features are   consistent with a pseudonormal left ventricular filling pattern,   with concomitant abnormal relaxation and increased filling   pressure (grade 2 diastolic dysfunction). - Aortic valve: There was no stenosis. - Mitral valve: There was mild regurgitation. - Left atrium: The atrium was mildly dilated. - Right ventricle: The cavity size was normal. Systolic function   was normal. - Right atrium: The atrium was mildly dilated. - Tricuspid valve: Peak RV-RA gradient (S): 32 mm Hg. - Pulmonary arteries: PA peak pressure: 35 mm Hg (S). - Inferior vena cava: The vessel was normal in size. The   respirophasic diameter changes were in the normal range (>= 50%),   consistent with normal central venous pressure.   ASSESSMENT AND PLAN:  1.  Persistent atrial fibrillation: Currently on Eliquis and diltiazem.  Status post ablation 04/10/2017.  Remains in sinus rhythm on flecainide.  This patients CHA2DS2-VASc Score and unadjusted Ischemic Stroke Rate (% per year) is equal to 2.2 % stroke rate/year from a score of 2  Above score calculated as 1 point each if present [CHF, HTN, DM, Vascular=MI/PAD/Aortic Plaque, Age if 65-74, or Female] Above score calculated as 2 points each if present [Age > 75, or Stroke/TIA/TE]   2. Hypertension: Mildly elevated today but has been normal in the past.  She is under quite a bit of stress today.  No changes.  3. Mild OSA: Currently not on CPAP  4. Atrial flutter, typical:  Status post ablation  5.  Morbid obesity: Diet and exercise encouraged  Current medicines are reviewed at length with the patient today.   The patient does not have concerns regarding her medicines.  The following changes were made today: None  Labs/ tests ordered today include:  Orders Placed This  Encounter  Procedures  . EKG 12-Lead     Disposition:   FU with Babette Stum 12 months  Signed, [facility] Meredith Leeds, MD  01/01/2019 2:57 PM     [address] [place] [id number] [phone] (office) [phone] (fax)

## 2019-01-01 NOTE — Progress Notes (Signed)
FOLLOW UP  Assessment and Plan:   Monica Morgan was seen today for thyroid problem.  Diagnoses and all orders for this visit:  Lump in neck Suspect thyroid nodule based on exam; obtain soft tissue to r/o inferior anterior cervical chain lymph node vs RLL thyroid nodule and reflex thyroid US Check TSH, CBC -     US SOFT TISSUE HEAD & NECK; Future -     TSH -     CBC with Differential/Platelet  Hypothyroid continue medications the same pending lab results reminded to take on an empty stomach 30-34mins before food.  check TSH level  Continue diet and meds as discussed. Further disposition pending results of labs. Discussed med's effects and SE's.   Over 15 minutes of exam, counseling, chart review, and critical decision making was performed.   Future Appointments  Date Time Provider [place]  02/04/2019  7:20 AM [facility] MM 2 [facility]  04/14/2019  3:00 PM Unk Pinto, MD [facility] None    ----------------------------------------------------------------------------------------------------------------------  HPI 58 y.o. female  Presents requesting "check up" on thyroid.  She she reports noted a lump at base of  her throat while on a zoom call, palpable, seems stable in size thus far, non-tender.    She has had persistent throat clearing, frequent dry cough x 1 year; attributed to allergies and postnasal drip though did improved after coming off of lisinopril.  She takes Singulair daily for allergies, will take claritin as well if needed.   No hx of smoking; no unintentional weight loss, fatigue, night sweats  She is on thyroid medication since 2014. Her medication was not changed last visit, 50 mcg daily for many years.    Lab Results  Component Value Date   TSH 2.010 03/25/2018     Current Medications:  Current Outpatient Medications on File Prior to Visit  Medication Sig  . apixaban (ELIQUIS) 5 MG TABS tablet Take 1 tablet (5 mg total) by mouth  2 (two) times daily.  Marland Kitchen augmented betamethasone dipropionate (DIPROLENE-AF) 0.05 % cream Apply 1 application topically daily as needed (for psoriasis). Apply very sparingly to Psoriasis rash (Do NOT use on face ! )  . Calcium 600-400 MG-UNIT CHEW Chew 1 each by mouth 2 (two) times daily.   . Cholecalciferol (VITAMIN D PO) Take 10,000 Units by mouth daily.   . Coenzyme Q10 (CO Q10) 100 MG CAPS Take 100 mg by mouth daily.   Marland Kitchen diltiazem (CARDIZEM CD) 120 MG 24 hr capsule Take 1 capsule (120 mg total) by mouth daily.  Marland Kitchen ezetimibe (ZETIA) 10 MG tablet Take 1 tablet Daily  for Cholestrerol  . flecainide (TAMBOCOR) 100 MG tablet TAKE 1 TABLET BY MOUTH TWICE A DAY  . furosemide (LASIX) 20 MG tablet Take 1 tablet Daily for Fluid Retention (Patient taking differently: as needed. Take 1 tablet Daily for Fluid Retention)  . levothyroxine (SYNTHROID) 50 MCG tablet Take 1 tablet daily on an empty stomach with only water for 30 minutes & no Antacid meds, Calcium or Magnesium for 4 hours & avoid Biotin  . loratadine (CLARITIN) 10 MG tablet TAKE 10 MG BY MOUTH DAILY AS NEEDED FOR ALLERGY RELIEF  . losartan (COZAAR) 25 MG tablet Take 1 tablet (25 mg total) by mouth daily.  . Magnesium 500 MG TABS Take 500 mg by mouth every evening.   . montelukast (SINGULAIR) 10 MG tablet Take 1 tablet Daily for Allergies  . Multiple Vitamin (MULTIVITAMIN WITH MINERALS) TABS tablet Take 1 tablet by mouth  daily.  . NONFORMULARY OR COMPOUNDED ITEM Place 1 each vaginally See admin instructions. Estriol/testosterone 0.25-0.25 vaginal suppository compound. Insert vaginally on Sunday, Monday and Wednesday  . NONFORMULARY OR COMPOUNDED ITEM Apply 1 each topically See admin instructions. Progesterone 8%/110ml Apply 0.88mls topically every day except Saturday.  Marland Kitchen OVER THE COUNTER MEDICATION Taking Ferrous Sulfate 325 mg daily.  Vladimir Faster Glycol-Propyl Glycol (SYSTANE OP) Place 1 drop into both eyes daily.  . Probiotic Product (PROBIOTIC  DAILY PO) Take 1 tablet by mouth daily.  . traMADol (ULTRAM) 50 MG tablet Take 1/2 to 1 tablet every 4 hours  as needed for pain  . Turmeric 500 MG CAPS Take 500 mg by mouth 2 (two) times daily.   No current facility-administered medications on file prior to visit.      Allergies:  Allergies  Allergen Reactions  . Lipitor [Atorvastatin] Other (See Comments)    "neck pain"  . Metoprolol Other (See Comments)    DIDN'T WORK FOR PATIENT AND GAINED WEIGHT MIGHT HAVE BEEN TAKEN WITH RYTHMOL  . Phentermine Other (See Comments)    Did not help, Insomnia   . Rythmol [Propafenone] Other (See Comments)    "weight gain"  . Sulfa Drugs Cross Reactors Other (See Comments)    "soreness all over"  . Lisinopril Cough     Medical History:  Past Medical History:  Diagnosis Date  . Arthritis    "probably in my knees; maybe in my hands" (04/11/2017)  . Asthma attack 10/2015 X 1  . Atrial flutter ([place]) 10/2015  . Heart murmur    "comes and goes" (04/11/2017)  . Hepatitis 1972   "don't know what kind"  . High cholesterol   . Hypertension   . Hypothyroidism   . Malignant melanoma of leg ([place])    "right thigh"  . PAF (paroxysmal atrial fibrillation) ([place])   . Pneumonia 1966; 1967   "left lung collapsed one of these times"  . Psoriasis   . Rosacea   . Vitamin D deficiency    Family history- Reviewed and unchanged Social history- Reviewed and unchanged   Review of Systems:  Review of Systems  Constitutional: Negative for chills, diaphoresis, fever, malaise/fatigue and weight loss.  HENT: Negative for congestion, hearing loss, sinus pain, sore throat and tinnitus.        Post nasal drip  Eyes: Negative for blurred vision and double vision.  Respiratory: Positive for cough (persistent occasional dry cough ). Negative for shortness of breath and wheezing.   Cardiovascular: Negative for chest pain, palpitations, orthopnea, claudication and leg swelling.  Gastrointestinal: Negative for  abdominal pain, blood in stool, constipation, diarrhea, heartburn, melena, nausea and vomiting.  Genitourinary: Negative.   Musculoskeletal: Negative for falls, joint pain and myalgias.  Skin: Negative for itching and rash.  Neurological: Negative for dizziness, tingling, sensory change, weakness and headaches.  Endo/Heme/Allergies: Positive for environmental allergies. Negative for polydipsia.  Psychiatric/Behavioral: Negative for depression, memory loss, substance abuse and suicidal ideas. The patient is not nervous/anxious and does not have insomnia.   All other systems reviewed and are negative.     Physical Exam: BP (!) 150/76   Pulse 67   Temp (!) 96.3 F (35.7 C)   Wt 249 lb 9.6 oz (113.2 kg)   LMP 10/29/2013 (Approximate)   SpO2 97%   BMI 40.90 kg/m  Wt Readings from Last 3 Encounters:  01/05/19 249 lb 9.6 oz (113.2 kg)  01/01/19 249 lb (112.9 kg)  06/17/18 238 lb (108 kg)  General Appearance: Well nourished, in no apparent distress. Eyes: PERRLA, conjunctiva no swelling or erythema Sinuses: No Frontal/maxillary tenderness ENT/Mouth: Ext aud canals clear, TMs without erythema, bulging. No erythema, swelling, or exudate on post pharynx.  Tonsils not swollen or erythematous. Hearing normal.  Neck: Supple, thyroid normal excepting superficial nodule R anterior neck - anterior cervical chain vs R lower lobe thryoid Respiratory: Respiratory effort normal, BS equal bilaterally without rales, rhonchi, wheezing or stridor.  Cardio: RRR with no MRGs. Brisk peripheral pulses without edema.  Abdomen: Soft, + BS.  Non tender, no guarding. Lymphatics: Non tender without lymphadenopathy excepting nodule to R anterior cervical chain Musculoskeletal: No obvious deformity, Normal gait Skin: Warm, dry without rashes, lesions, ecchymosis.  Neuro: Cranial nerves intact. No cerebellar symptoms.  Psych: Awake and oriented X 3, normal affect, Insight and Judgment appropriate.    Izora Ribas, NP 4:04 PM [facility]

## 2019-01-05 ENCOUNTER — Ambulatory Visit: Payer: BLUE CROSS/BLUE SHIELD | Admitting: Adult Health

## 2019-01-05 ENCOUNTER — Encounter: Payer: Self-pay | Admitting: Adult Health

## 2019-01-05 ENCOUNTER — Other Ambulatory Visit: Payer: Self-pay

## 2019-01-05 VITALS — BP 150/76 | HR 67 | Temp 96.3°F | Wt 249.6 lb

## 2019-01-05 DIAGNOSIS — R221 Localized swelling, mass and lump, neck: Secondary | ICD-10-CM

## 2019-01-05 DIAGNOSIS — E039 Hypothyroidism, unspecified: Secondary | ICD-10-CM

## 2019-01-05 DIAGNOSIS — Z79899 Other long term (current) drug therapy: Secondary | ICD-10-CM | POA: Diagnosis not present

## 2019-01-05 NOTE — Patient Instructions (Signed)
Thyroid Nodule  A thyroid nodule is an isolated growth of thyroid cells that forms a lump in your thyroid gland. The thyroid gland is a butterfly-shaped gland. It is found in the lower front of your neck. This gland sends chemical messengers (hormones) through your blood to all parts of your body. These hormones are important in regulating your body temperature and helping your body to use energy. Thyroid nodules are common. Most are not cancerous (benign). You may have one nodule or several nodules. Different types of thyroid nodules include nodules that:  Grow and fill with fluid (thyroid cysts).  Produce too much thyroid hormone (hot nodules or hyperthyroid).  Produce no thyroid hormone (cold nodules or hypothyroid).  Form from cancer cells (thyroid cancers). What are the causes? In most cases, the cause of this condition is not known. What increases the risk? The following factors may make you more likely to develop this condition.  Age. Thyroid nodules become more common in people who are older than 60 years of age.  Gender. ? Benign thyroid nodules are more common in women. ? Cancerous (malignant) thyroid nodules are more common in men.  A family history that includes: ? Thyroid nodules. ? Pheochromocytoma. ? Thyroid carcinoma. ? Hyperparathyroidism.  Certain kinds of thyroid diseases, such as Hashimoto's thyroiditis.  Lack of iodine in your diet.  A history of head and neck radiation, such as from previous cancer treatment. What are the signs or symptoms? In many cases, there are no symptoms. If you have symptoms, they may include:  A lump in your lower neck.  Feeling a lump or tickle in your throat.  Pain in your neck, jaw, or ear.  Having trouble swallowing. Hot nodules may cause symptoms that include:  Weight loss.  Warm, flushed skin.  Feeling hot.  Feeling nervous.  A racing heartbeat. Cold nodules may cause symptoms that include:  Weight gain.   Dry skin.  Brittle hair. This may also occur with hair loss.  Feeling cold.  Fatigue. Thyroid cancer nodules may cause symptoms that include:  Hard nodules that feel stuck to the thyroid gland.  Hoarseness.  Lumps in the glands near your thyroid (lymph nodes). How is this diagnosed? A thyroid nodule may be felt by your health care provider during a physical exam. This condition may also be diagnosed based on your symptoms. You may also have tests, including:  An ultrasound. This may be done to confirm the diagnosis.  A biopsy. This involves taking a sample from the nodule and looking at it under a microscope.  Blood tests to make sure that your thyroid is working properly.  A thyroid scan. This test uses a radioactive tracer injected into a vein to create an image of the thyroid gland on a computer screen.  Imaging tests such as MRI or CT scan. These may be done if: ? Your nodule is large. ? Your nodule is blocking your airway. ? Cancer is suspected. How is this treated? Treatment depends on the cause and size of your nodule or nodules. If the nodule is benign, treatment may not be necessary. Your health care provider may monitor the nodule to see if it goes away without treatment. If the nodule continues to grow, is cancerous, or does not go away, treatment may be needed. Treatment may include:  Having a cystic nodule drained with a needle.  Ablation therapy. In this treatment, alcohol is injected into the area of the nodule to destroy the cells. Ablation with heat (  thermal ablation) may also be used.  Radioactive iodine. In this treatment, radioactive iodine is given as a pill or liquid that you drink. This substance causes the thyroid nodule to shrink.  Surgery to remove the nodule. Part or all of your thyroid gland may need to be removed as well.  Medicines. Follow these instructions at home:  Pay attention to any changes in your nodule.  Take over-the-counter and  prescription medicines only as told by your health care provider.  Keep all follow-up visits as told by your health care provider. This is important. Contact a health care provider if:  Your voice changes.  You have trouble swallowing.  You have pain in your neck, ear, or jaw that is getting worse.  Your nodule gets bigger.  Your nodule starts to make it harder for you to breathe.  Your muscles look like they are shrinking (muscle wasting). Get help right away if:  You have chest pain.  There is a loss of consciousness.  You have a sudden fever.  You feel confused.  You are seeing or hearing things that other people do not see or hear (having hallucinations).  You feel very weak.  You have mood swings.  You feel very restless.  You feel suddenly nauseous or throw up.  You suddenly have diarrhea. Summary  A thyroid nodule is an isolated growth of thyroid cells that forms a lump in your thyroid gland.  Thyroid nodules are common. Most are not cancerous (benign). You may have one nodule or several nodules.  Treatment depends on the cause and size of your nodule or nodules. If the nodule is benign, treatment may not be necessary.  Your health care provider may monitor the nodule to see if it goes away without treatment. If the nodule continues to grow, is cancerous, or does not go away, treatment may be needed. This information is not intended to replace advice given to you by your health care provider. Make sure you discuss any questions you have with your health care provider. Document Released: 02/24/2004 Document Revised: 11/15/2017 Document Reviewed: 11/18/2017 Elsevier Patient Education  2020 [employer]  

## 2019-01-06 LAB — CBC WITH DIFFERENTIAL/PLATELET
Absolute Monocytes: 717 cells/uL (ref 200–950)
Basophils Absolute: 64 cells/uL (ref 0–200)
Basophils Relative: 0.5 %
Eosinophils Absolute: 166 cells/uL (ref 15–500)
Eosinophils Relative: 1.3 %
HCT: 42.1 % (ref 35.0–45.0)
Hemoglobin: 14 g/dL (ref 11.7–15.5)
Lymphs Abs: 3789 cells/uL (ref 850–3900)
MCH: 29.5 pg (ref 27.0–33.0)
MCHC: 33.3 g/dL (ref 32.0–36.0)
MCV: 88.6 fL (ref 80.0–100.0)
MPV: 10.3 fL (ref 7.5–12.5)
Monocytes Relative: 5.6 %
Neutro Abs: 8064 cells/uL — ABNORMAL HIGH (ref 1500–7800)
Neutrophils Relative %: 63 %
Platelets: 313 10*3/uL (ref 140–400)
RBC: 4.75 10*6/uL (ref 3.80–5.10)
RDW: 12.6 % (ref 11.0–15.0)
Total Lymphocyte: 29.6 %
WBC: 12.8 10*3/uL — ABNORMAL HIGH (ref 3.8–10.8)

## 2019-01-06 LAB — TSH: TSH: 2.52 mIU/L (ref 0.40–4.50)

## 2019-01-07 DIAGNOSIS — Z9889 Other specified postprocedural states: Secondary | ICD-10-CM | POA: Diagnosis not present

## 2019-01-07 DIAGNOSIS — H40013 Open angle with borderline findings, low risk, bilateral: Secondary | ICD-10-CM | POA: Diagnosis not present

## 2019-01-07 DIAGNOSIS — H35412 Lattice degeneration of retina, left eye: Secondary | ICD-10-CM | POA: Diagnosis not present

## 2019-01-12 ENCOUNTER — Ambulatory Visit
Admission: RE | Admit: 2019-01-12 | Discharge: 2019-01-12 | Disposition: A | Discharge status: Home / Self Care | Payer: BC Managed Care – PPO | Source: Ambulatory Visit | Attending: Adult Health | Admitting: Adult Health

## 2019-01-12 DIAGNOSIS — R221 Localized swelling, mass and lump, neck: Secondary | ICD-10-CM

## 2019-01-12 DIAGNOSIS — E042 Nontoxic multinodular goiter: Secondary | ICD-10-CM | POA: Diagnosis not present

## 2019-01-14 ENCOUNTER — Encounter: Payer: Self-pay | Admitting: Adult Health

## 2019-01-14 DIAGNOSIS — E042 Nontoxic multinodular goiter: Secondary | ICD-10-CM | POA: Insufficient documentation

## 2019-02-03 ENCOUNTER — Other Ambulatory Visit: Payer: Self-pay | Admitting: Internal Medicine

## 2019-02-03 DIAGNOSIS — L409 Psoriasis, unspecified: Secondary | ICD-10-CM

## 2019-02-04 ENCOUNTER — Ambulatory Visit
Admission: RE | Admit: 2019-02-04 | Discharge: 2019-02-04 | Disposition: A | Discharge status: Home / Self Care | Payer: BC Managed Care – PPO | Source: Ambulatory Visit | Attending: Internal Medicine | Admitting: Internal Medicine

## 2019-02-04 ENCOUNTER — Other Ambulatory Visit: Payer: Self-pay

## 2019-02-04 DIAGNOSIS — Z1231 Encounter for screening mammogram for malignant neoplasm of breast: Secondary | ICD-10-CM

## 2019-02-16 DIAGNOSIS — M9901 Segmental and somatic dysfunction of cervical region: Secondary | ICD-10-CM | POA: Diagnosis not present

## 2019-02-16 DIAGNOSIS — M9902 Segmental and somatic dysfunction of thoracic region: Secondary | ICD-10-CM | POA: Diagnosis not present

## 2019-02-16 DIAGNOSIS — M546 Pain in thoracic spine: Secondary | ICD-10-CM | POA: Diagnosis not present

## 2019-02-16 DIAGNOSIS — M62838 Other muscle spasm: Secondary | ICD-10-CM | POA: Diagnosis not present

## 2019-02-18 DIAGNOSIS — M546 Pain in thoracic spine: Secondary | ICD-10-CM | POA: Diagnosis not present

## 2019-02-18 DIAGNOSIS — M62838 Other muscle spasm: Secondary | ICD-10-CM | POA: Diagnosis not present

## 2019-02-18 DIAGNOSIS — M978XXS Periprosthetic fracture around other internal prosthetic joint, sequela: Secondary | ICD-10-CM | POA: Diagnosis not present

## 2019-02-18 DIAGNOSIS — M9902 Segmental and somatic dysfunction of thoracic region: Secondary | ICD-10-CM | POA: Diagnosis not present

## 2019-03-19 ENCOUNTER — Encounter

## 2019-03-19 ENCOUNTER — Ambulatory Visit: Payer: BLUE CROSS/BLUE SHIELD | Admitting: Cardiology

## 2019-03-25 DIAGNOSIS — Z1151 Encounter for screening for human papillomavirus (HPV): Secondary | ICD-10-CM | POA: Diagnosis not present

## 2019-03-25 DIAGNOSIS — Z01419 Encounter for gynecological examination (general) (routine) without abnormal findings: Secondary | ICD-10-CM | POA: Diagnosis not present

## 2019-03-25 DIAGNOSIS — Z6841 Body Mass Index (BMI) 40.0 and over, adult: Secondary | ICD-10-CM | POA: Diagnosis not present

## 2019-04-03 ENCOUNTER — Other Ambulatory Visit: Payer: Self-pay | Admitting: *Deleted

## 2019-04-03 MED ORDER — DILTIAZEM HCL ER COATED BEADS 120 MG PO CP24: 120.0000 mg | ORAL_CAPSULE | Freq: Every day | ORAL | 3 refills | Status: DC

## 2019-04-13 ENCOUNTER — Encounter: Payer: Self-pay | Admitting: Internal Medicine

## 2019-04-13 NOTE — Progress Notes (Signed)
Annual Screening/Preventative Visit & Comprehensive Evaluation &  Examination     This very nice 58 y.o. DWF presents for a Screening /Preventative Visit & comprehensive evaluation and management of multiple medical co-morbidities.  Patient has been followed for HTN, HLD, glucose intolerance and Vitamin D Deficiency.      HTN predates circa 2013.  In 2017 & 2018, she had ablations by Dr Curt Bears  for pAfib & A Flutter. She remains on Eliquis & Diltiazem for CHA2DsVASc 2.  Patient's BP has been controlled at home and patient denies any cardiac symptoms as chest pain, palpitations, shortness of breath, dizziness or ankle swelling. Today's BP is at goal -  140/78.      Patient's hyperlipidemia is controlled with diet and Ezetimibe except elevated Trig's. She does have hx/o intolerance to Lipitor.  Patient denies myalgias or other medication SE's. Last lipids were at goal except elevated Trig's:  Lab Results  Component Value Date   CHOL 170 06/04/2018   HDL 47 (L) 06/04/2018   LDLCALC 89 06/04/2018   TRIG 244 (H) 06/04/2018   CHOLHDL 3.6 06/04/2018      Patient was dx'd Hypothyroid in 2014 & has been on thyroid replacement since. Patient had Thyroid U/S in Sept and have several small nodules and only one of which (Rt inferior) was advised annual U/S  follow-up in 1 year.      Patient has Morbid Obesity (BMI 39+) and is monitored expectantly for glucose intolerance and patient denies reactive hypoglycemic symptoms, visual blurring, diabetic polys or paresthesias. Last A1c was Normal & at goal:  Lab Results  Component Value Date   HGBA1C 5.5 06/04/2018       Finally, patient has history of Vitamin D Deficiency and last Vitamin D was at goal:  Lab Results  Component Value Date   VD25OH 71 06/04/2018   Current Outpatient Medications on File Prior to Visit  Medication Sig  . apixaban (ELIQUIS) 5 MG TABS tablet Take 1 tablet (5 mg total) by mouth 2 (two) times daily.  Marland Kitchen augmented  betamethasone dipropionate (DIPROLENE-AF) 0.05 % cream APPLY VERY SPARINGLY TO PSORIASIS RASH (DO NOT USE ON FACE ! )  . Calcium 600-400 MG-UNIT CHEW Chew 1 each by mouth 2 (two) times daily.   . Cholecalciferol (VITAMIN D PO) Take 10,000 Units by mouth daily.   . Coenzyme Q10 (CO Q10) 100 MG CAPS Take 100 mg by mouth daily.   Marland Kitchen diltiazem (CARDIZEM CD) 120 MG 24 hr capsule Take 1 capsule (120 mg total) by mouth daily.  Marland Kitchen ezetimibe (ZETIA) 10 MG tablet Take 1 tablet Daily  for Cholestrerol  . flecainide (TAMBOCOR) 100 MG tablet TAKE 1 TABLET BY MOUTH TWICE A DAY  . furosemide (LASIX) 20 MG tablet Take 1 tablet Daily for Fluid Retention (Patient taking differently: as needed. Take 1 tablet Daily for Fluid Retention)  . levothyroxine (SYNTHROID) 50 MCG tablet Take 1 tablet daily on an empty stomach with only water for 30 minutes & no Antacid meds, Calcium or Magnesium for 4 hours & avoid Biotin  . loratadine (CLARITIN) 10 MG tablet TAKE 10 MG BY MOUTH DAILY AS NEEDED FOR ALLERGY RELIEF  . losartan (COZAAR) 25 MG tablet Take 1 tablet (25 mg total) by mouth daily.  . Magnesium 500 MG TABS Take 500 mg by mouth every evening.   . montelukast (SINGULAIR) 10 MG tablet Take 1 tablet Daily for Allergies  . Multiple Vitamin (MULTIVITAMIN WITH MINERALS) TABS tablet Take 1 tablet by mouth  daily.  . NONFORMULARY OR COMPOUNDED ITEM Place 1 each vaginally See admin instructions. Estriol/testosterone 0.25-0.25 vaginal suppository compound. Insert vaginally on Sunday, Monday and Wednesday  . NONFORMULARY OR COMPOUNDED ITEM Apply 1 each topically See admin instructions. Progesterone 8%/75ml Apply 0.65mls topically every day except Saturday.  Marland Kitchen OVER THE COUNTER MEDICATION Taking Ferrous Sulfate 325 mg daily.  Vladimir Faster Glycol-Propyl Glycol (SYSTANE OP) Place 1 drop into both eyes daily.  . Probiotic Product (PROBIOTIC DAILY PO) Take 1 tablet by mouth daily.  . Turmeric 500 MG CAPS Take 500 mg by mouth 2 (two) times  daily.   No current facility-administered medications on file prior to visit.   Allergies  Allergen Reactions  . Lipitor [Atorvastatin] Other (See Comments)    "neck pain"  . Metoprolol Other (See Comments)    DIDN'T WORK FOR PATIENT AND GAINED WEIGHT MIGHT HAVE BEEN TAKEN WITH RYTHMOL  . Phentermine Other (See Comments)    Did not help, Insomnia   . Rythmol [Propafenone] Other (See Comments)    "weight gain"  . Sulfa Drugs Cross Reactors Other (See Comments)    "soreness all over"  . Lisinopril Cough   Past Medical History:  Diagnosis Date  . Arthritis    "probably in my knees; maybe in my hands" (04/11/2017)  . Asthma attack 10/2015 X 1  . Atrial flutter ([place]) 10/2015  . Heart murmur    "comes and goes" (04/11/2017)  . Hepatitis 1972   "don't know what kind"  . High cholesterol   . Hypertension   . Hypothyroidism   . Malignant melanoma of leg ([place])    "right thigh"  . PAF (paroxysmal atrial fibrillation) ([place])   . Pneumonia 1966; 1967   "left lung collapsed one of these times"  . Psoriasis   . Rosacea   . Vitamin D deficiency    Health Maintenance  Topic Date Due  . PAP SMEAR-Modifier  10/01/1979  . COLONOSCOPY  09/30/2008  . INFLUENZA VACCINE  11/15/2018  . MAMMOGRAM  02/03/2021  . TETANUS/TDAP  06/22/2025  . Hepatitis C Screening  Completed  . HIV Screening  Completed   Immunization History  Administered Date(s) Administered  . PPD Test 12/07/2013, 12/22/2014, 08/17/2016, 11/27/2017, 04/14/2019  . Tdap 06/23/2015   Last Colon - never - declines  Last MGM - 02/04/2019  Past Surgical History:  Procedure Laterality Date  . A-FLUTTER ABLATION N/A 02/08/2017   Procedure: A-Flutter Ablation;  Surgeon: Constance Haw, MD;  Location: [place] [facility];  Service: Cardiovascular;  Laterality: N/A;  . ATRIAL FIBRILLATION ABLATION N/A 04/11/2017   Procedure: ATRIAL FIBRILLATION ABLATION;  Surgeon: Constance Haw, MD;  Location: [place] [facility];   Service: Cardiovascular;  Laterality: N/A;  . CARDIOVERSION N/A 10/25/2015   Procedure: CARDIOVERSION;  Surgeon: Sueanne Margarita, MD;  Location: [place];  Service: Cardiovascular;  Laterality: N/A;  . [place]  . CESAREAN SECTION WITH BILATERAL TUBAL LIGATION  1982  . ELECTROPHYSIOLOGIC STUDY N/A 01/13/2016   Procedure: Atrial Fibrillation Ablation;  Surgeon: Will Meredith Leeds, MD;  Location: [facility];  Service: Cardiovascular;  Laterality: N/A;  . ENDOMETRIAL ABLATION  ~ 2012  . MELANOMA EXCISION Right 10/1975   outer thigh "malignant"  . REFRACTIVE SURGERY Bilateral 1990s  . TEE WITHOUT CARDIOVERSION N/A 10/25/2015   Procedure: TRANSESOPHAGEAL ECHOCARDIOGRAM (TEE);  Surgeon: Sueanne Margarita, MD;  Location: [facility];  Service: Cardiovascular;  Laterality: N/A;  . TEE WITHOUT CARDIOVERSION N/A 01/11/2016  Procedure: TRANSESOPHAGEAL ECHOCARDIOGRAM (TEE);  Surgeon: Josue Hector, MD;  Location: [place];  Service: Cardiovascular;  Laterality: N/A;  . TONSILLECTOMY  1967  . TUBAL LIGATION     Family History  Problem Relation Age of Onset  . Heart disease Mother   . Hypertension Mother   . Heart attack Mother   . Heart disease Father   . Diabetes Father   . Hypertension Father   . Heart attack Brother   . Colon cancer Neg Hx   . Stroke Neg Hx    Social History   Tobacco Use  . Smoking status: Never Smoker  . Smokeless tobacco: Never Used  Substance Use Topics  . Alcohol use: Yes    Comment: 04/11/2017 "q couple months I'll have a couple drinks "  . Drug use: No    ROS Constitutional: Denies fever, chills, weight loss/gain, headaches, insomnia,  night sweats, and change in appetite. Does c/o fatigue. Eyes: Denies redness, blurred vision, diplopia, discharge, itchy, watery eyes.  ENT: Denies discharge, congestion, post nasal drip, epistaxis, sore throat, earache, hearing loss, dental pain, Tinnitus, Vertigo, Sinus pain, snoring.  Cardio: Denies  chest pain, palpitations, irregular heartbeat, syncope, dyspnea, diaphoresis, orthopnea, PND, claudication, edema Respiratory: denies cough, dyspnea, DOE, pleurisy, hoarseness, laryngitis, wheezing.  Gastrointestinal: Denies dysphagia, heartburn, reflux, water brash, pain, cramps, nausea, vomiting, bloating, diarrhea, constipation, hematemesis, melena, hematochezia, jaundice, hemorrhoids Genitourinary: Denies dysuria, frequency, urgency, nocturia, hesitancy, discharge, hematuria, flank pain Breast: Breast lumps, nipple discharge, bleeding.  Musculoskeletal: Denies arthralgia, myalgia, stiffness, Jt. Swelling, pain, limp, and strain/sprain. Denies falls. Skin: Denies puritis, rash, hives, warts, acne, eczema, changing in skin lesion Neuro: No weakness, tremor, incoordination, spasms, paresthesia, pain Psychiatric: Denies confusion, memory loss, sensory loss. Denies Depression. Endocrine: Denies change in weight, skin, hair change, nocturia, and paresthesia, diabetic polys, visual blurring, hyper / hypo glycemic episodes.  Heme/Lymph: No excessive bleeding, bruising, enlarged lymph nodes.  Physical Exam  BP 140/78   Pulse 64   Temp 98.1 F (36.7 C)   Resp 16   Ht 5' 5.5" (1.664 m)   Wt 246 lb 12.8 oz (111.9 kg)   LMP 10/29/2013 (Approximate)   BMI 40.45 kg/m   General Appearance: Well nourished, well groomed and in no apparent distress.  Eyes: PERRLA, EOMs, conjunctiva no swelling or erythema, normal fundi and vessels. Sinuses: No frontal/maxillary tenderness ENT/Mouth: EACs patent / TMs  nl. Nares clear without erythema, swelling, mucoid exudates. Oral hygiene is good. No erythema, swelling, or exudate. Tongue normal, non-obstructing. Tonsils not swollen or erythematous. Hearing normal.  Neck: Supple, thyroid not palpable. No bruits, nodes or JVD. Respiratory: Respiratory effort normal.  BS equal and clear bilateral without rales, rhonci, wheezing or stridor. Cardio: Heart sounds are  normal with regular rate and rhythm and no murmurs, rubs or gallops. Peripheral pulses are normal and equal bilaterally without edema. No aortic or femoral bruits. Chest: symmetric with normal excursions and percussion. Breasts: Symmetric, without lumps, nipple discharge, retractions, or fibrocystic changes.  Abdomen: Flat, soft with bowel sounds active. Nontender, no guarding, rebound, hernias, masses, or organomegaly.  Lymphatics: Non tender without lymphadenopathy.  Genitourinary:  Musculoskeletal: Full ROM all peripheral extremities, joint stability, 5/5 strength, and normal gait. Skin: Warm and dry without rashes, lesions, cyanosis, clubbing or  ecchymosis.  Neuro: Cranial nerves intact, reflexes equal bilaterally. Normal muscle tone, no cerebellar symptoms. Sensation intact.  Pysch: Alert and oriented X 3, normal affect, Insight and Judgment appropriate.   Assessment and Plan  1. Annual  Preventative Screening Examination  2. Essential hypertension  - EKG 12-Lead - [place], retroperitnl abd,  ltd - Urinalysis, Routine w reflex microscopic - Microalbumin / Creatinine Urine Ratio - CBC with Diff - COMPLETE METABOLIC PANEL WITH GFR - Magnesium - TSH  3. Hyperlipidemia, mixed  - EKG 12-Lead - [place], retroperitnl abd,  ltd - Lipid Profile - TSH  4. Abnormal glucose  - EKG 12-Lead - [place], retroperitnl abd,  ltd - Hemoglobin A1c ([facility]) - Insulin, random  5. Paroxysmal atrial fibrillation (HCC)  - EKG 12-Lead - TSH  6. Vitamin D deficiency  - Vitamin D (25 hydroxy)  7. Hypothyroidism  - TSH  8. Class 2 severe obesity due to excess calories with serious comorbidity  and body mass index (BMI) of 39.0 to 39.9 in adult ([place])   9. Screening for colorectal cancer  - POC Hemoccult Bld/Stl   10. Screening examination for pulmonary tuberculosis  - TB Skin Test  11. Screening for ischemic heart disease  - EKG 12-Lead  12. FHx: heart disease  - EKG 12-Lead - [place],  retroperitnl abd,  ltd  13. Screening for AAA (aortic abdominal aneurysm)  - [place], retroperitnl abd,  ltd  14. Fatigue, unspecified type  - Iron,Total/Total Iron Binding Cap - Vitamin B12 - TB Skin Test - TSH  15. Medication management  - Urinalysis, Routine w reflex microscopic - Microalbumin / Creatinine Urine Ratio - CBC with Diff - COMPLETE METABOLIC PANEL WITH GFR - Magnesium - Lipid Profile - TSH - Hemoglobin A1c ([facility]) - Insulin, random - Vitamin D (25 hydroxy)         Patient was counseled in prudent diet to achieve/maintain BMI less than 25 for weight control, BP monitoring, regular exercise and medications. Discussed med's effects and SE's. Screening labs and tests as requested with regular follow-up as recommended. Over 40 minutes of exam, counseling, chart review and high complex critical decision making was performed.   Kirtland Bouchard, MD

## 2019-04-13 NOTE — Patient Instructions (Signed)

## 2019-04-14 ENCOUNTER — Ambulatory Visit (INDEPENDENT_AMBULATORY_CARE_PROVIDER_SITE_OTHER): Payer: BC Managed Care – PPO | Admitting: Internal Medicine

## 2019-04-14 ENCOUNTER — Other Ambulatory Visit: Payer: Self-pay

## 2019-04-14 VITALS — BP 140/78 | HR 64 | Temp 98.1°F | Resp 16 | Ht 65.5 in | Wt 246.8 lb

## 2019-04-14 DIAGNOSIS — Z8249 Family history of ischemic heart disease and other diseases of the circulatory system: Secondary | ICD-10-CM | POA: Diagnosis not present

## 2019-04-14 DIAGNOSIS — R7309 Other abnormal glucose: Secondary | ICD-10-CM

## 2019-04-14 DIAGNOSIS — Z136 Encounter for screening for cardiovascular disorders: Secondary | ICD-10-CM

## 2019-04-14 DIAGNOSIS — Z130 Encounter for screening for diseases of the blood and blood-forming organs and certain disorders involving the immune mechanism: Secondary | ICD-10-CM

## 2019-04-14 DIAGNOSIS — Z0000 Encounter for general adult medical examination without abnormal findings: Secondary | ICD-10-CM

## 2019-04-14 DIAGNOSIS — Z13220 Encounter for screening for lipoid disorders: Secondary | ICD-10-CM

## 2019-04-14 DIAGNOSIS — E782 Mixed hyperlipidemia: Secondary | ICD-10-CM

## 2019-04-14 DIAGNOSIS — E039 Hypothyroidism, unspecified: Secondary | ICD-10-CM

## 2019-04-14 DIAGNOSIS — Z111 Encounter for screening for respiratory tuberculosis: Secondary | ICD-10-CM

## 2019-04-14 DIAGNOSIS — I10 Essential (primary) hypertension: Secondary | ICD-10-CM | POA: Diagnosis not present

## 2019-04-14 DIAGNOSIS — E559 Vitamin D deficiency, unspecified: Secondary | ICD-10-CM

## 2019-04-14 DIAGNOSIS — Z79899 Other long term (current) drug therapy: Secondary | ICD-10-CM | POA: Diagnosis not present

## 2019-04-14 DIAGNOSIS — Z131 Encounter for screening for diabetes mellitus: Secondary | ICD-10-CM

## 2019-04-14 DIAGNOSIS — Z1389 Encounter for screening for other disorder: Secondary | ICD-10-CM

## 2019-04-14 DIAGNOSIS — I480 Paroxysmal atrial fibrillation: Secondary | ICD-10-CM

## 2019-04-14 DIAGNOSIS — Z1211 Encounter for screening for malignant neoplasm of colon: Secondary | ICD-10-CM

## 2019-04-14 DIAGNOSIS — Z1212 Encounter for screening for malignant neoplasm of rectum: Secondary | ICD-10-CM

## 2019-04-14 DIAGNOSIS — Z1329 Encounter for screening for other suspected endocrine disorder: Secondary | ICD-10-CM | POA: Diagnosis not present

## 2019-04-14 DIAGNOSIS — R5383 Other fatigue: Secondary | ICD-10-CM

## 2019-04-14 DIAGNOSIS — Z0001 Encounter for general adult medical examination with abnormal findings: Secondary | ICD-10-CM

## 2019-04-15 LAB — IRON, TOTAL/TOTAL IRON BINDING CAP
%SAT: 12 % (calc) — ABNORMAL LOW (ref 16–45)
Iron: 41 ug/dL — ABNORMAL LOW (ref 45–160)
TIBC: 353 mcg/dL (calc) (ref 250–450)

## 2019-04-15 LAB — COMPLETE METABOLIC PANEL WITH GFR
AG Ratio: 1.7 (calc) (ref 1.0–2.5)
ALT: 17 U/L (ref 6–29)
AST: 15 U/L (ref 10–35)
Albumin: 4.5 g/dL (ref 3.6–5.1)
Alkaline phosphatase (APISO): 56 U/L (ref 37–153)
BUN/Creatinine Ratio: 16 (calc) (ref 6–22)
BUN: 17 mg/dL (ref 7–25)
CO2: 27 mmol/L (ref 20–32)
Calcium: 10.1 mg/dL (ref 8.6–10.4)
Chloride: 103 mmol/L (ref 98–110)
Creat: 1.09 mg/dL — ABNORMAL HIGH (ref 0.50–0.99)
GFR, Est African American: 64 mL/min/{1.73_m2} (ref >=60)
GFR, Est Non African American: 55 mL/min/{1.73_m2} — ABNORMAL LOW (ref >=60)
Globulin: 2.7 g/dL (calc) (ref 1.9–3.7)
Glucose, Bld: 117 mg/dL — ABNORMAL HIGH (ref 65–99)
Potassium: 4.3 mmol/L (ref 3.5–5.3)
Sodium: 143 mmol/L (ref 135–146)
Total Bilirubin: 0.3 mg/dL (ref 0.2–1.2)
Total Protein: 7.2 g/dL (ref 6.1–8.1)

## 2019-04-15 LAB — URINALYSIS, ROUTINE W REFLEX MICROSCOPIC
Bilirubin Urine: NEGATIVE
Glucose, UA: NEGATIVE
Hgb urine dipstick: NEGATIVE
Ketones, ur: NEGATIVE
Leukocytes,Ua: NEGATIVE
Nitrite: NEGATIVE
Protein, ur: NEGATIVE
Specific Gravity, Urine: 1.016 (ref 1.001–1.03)
pH: 6.5 (ref 5.0–8.0)

## 2019-04-15 LAB — CBC WITH DIFFERENTIAL/PLATELET
Absolute Monocytes: 825 cells/uL (ref 200–950)
Basophils Absolute: 75 cells/uL (ref 0–200)
Basophils Relative: 0.6 %
Eosinophils Absolute: 150 cells/uL (ref 15–500)
Eosinophils Relative: 1.2 %
HCT: 41.9 % (ref 35.0–45.0)
Hemoglobin: 13.9 g/dL (ref 11.7–15.5)
Lymphs Abs: 3700 cells/uL (ref 850–3900)
MCH: 29.1 pg (ref 27.0–33.0)
MCHC: 33.2 g/dL (ref 32.0–36.0)
MCV: 87.7 fL (ref 80.0–100.0)
MPV: 10.2 fL (ref 7.5–12.5)
Monocytes Relative: 6.6 %
Neutro Abs: 7750 cells/uL (ref 1500–7800)
Neutrophils Relative %: 62 %
Platelets: 322 10*3/uL (ref 140–400)
RBC: 4.78 10*6/uL (ref 3.80–5.10)
RDW: 12.6 % (ref 11.0–15.0)
Total Lymphocyte: 29.6 %
WBC: 12.5 10*3/uL — ABNORMAL HIGH (ref 3.8–10.8)

## 2019-04-15 LAB — MICROALBUMIN / CREATININE URINE RATIO
Creatinine, Urine: 100 mg/dL (ref 20–275)
Microalb Creat Ratio: 5 mcg/mg creat (ref <30)
Microalb, Ur: 0.5 mg/dL

## 2019-04-15 LAB — LIPID PANEL
Cholesterol: 170 mg/dL (ref <200)
HDL: 51 mg/dL (ref >=50)
LDL Cholesterol (Calc): 86 mg/dL (calc)
Non-HDL Cholesterol (Calc): 119 mg/dL (calc) (ref <130)
Total CHOL/HDL Ratio: 3.3 (calc) (ref <5.0)
Triglycerides: 235 mg/dL — ABNORMAL HIGH (ref <150)

## 2019-04-15 LAB — INSULIN, RANDOM: Insulin: 101.9 u[IU]/mL — ABNORMAL HIGH

## 2019-04-15 LAB — HEMOGLOBIN A1C
Hgb A1c MFr Bld: 5.7 % of total Hgb — ABNORMAL HIGH (ref <5.7)
Mean Plasma Glucose: 117 (calc)
eAG (mmol/L): 6.5 (calc)

## 2019-04-15 LAB — VITAMIN B12: Vitamin B-12: >2000 pg/mL — ABNORMAL HIGH (ref 200–1100)

## 2019-04-15 LAB — TSH: TSH: 1.93 mIU/L (ref 0.40–4.50)

## 2019-04-15 LAB — VITAMIN D 25 HYDROXY (VIT D DEFICIENCY, FRACTURES): Vit D, 25-Hydroxy: 99 ng/mL (ref 30–100)

## 2019-04-15 LAB — MAGNESIUM: Magnesium: 2.4 mg/dL (ref 1.5–2.5)

## 2019-04-22 ENCOUNTER — Other Ambulatory Visit: Payer: Self-pay | Admitting: Cardiology

## 2019-05-11 DIAGNOSIS — M9901 Segmental and somatic dysfunction of cervical region: Secondary | ICD-10-CM | POA: Diagnosis not present

## 2019-05-11 DIAGNOSIS — M546 Pain in thoracic spine: Secondary | ICD-10-CM | POA: Diagnosis not present

## 2019-05-11 DIAGNOSIS — M542 Cervicalgia: Secondary | ICD-10-CM | POA: Diagnosis not present

## 2019-05-11 DIAGNOSIS — M9902 Segmental and somatic dysfunction of thoracic region: Secondary | ICD-10-CM | POA: Diagnosis not present

## 2019-05-12 DIAGNOSIS — M9901 Segmental and somatic dysfunction of cervical region: Secondary | ICD-10-CM | POA: Diagnosis not present

## 2019-05-12 DIAGNOSIS — M546 Pain in thoracic spine: Secondary | ICD-10-CM | POA: Diagnosis not present

## 2019-05-12 DIAGNOSIS — M542 Cervicalgia: Secondary | ICD-10-CM | POA: Diagnosis not present

## 2019-05-12 DIAGNOSIS — M9902 Segmental and somatic dysfunction of thoracic region: Secondary | ICD-10-CM | POA: Diagnosis not present

## 2019-05-13 DIAGNOSIS — M9901 Segmental and somatic dysfunction of cervical region: Secondary | ICD-10-CM | POA: Diagnosis not present

## 2019-05-13 DIAGNOSIS — M546 Pain in thoracic spine: Secondary | ICD-10-CM | POA: Diagnosis not present

## 2019-05-13 DIAGNOSIS — M542 Cervicalgia: Secondary | ICD-10-CM | POA: Diagnosis not present

## 2019-05-13 DIAGNOSIS — M9902 Segmental and somatic dysfunction of thoracic region: Secondary | ICD-10-CM | POA: Diagnosis not present

## 2019-05-17 ENCOUNTER — Other Ambulatory Visit: Payer: Self-pay | Admitting: Cardiology

## 2019-05-18 NOTE — Telephone Encounter (Signed)
Pt last saw Dr Curt Bears 01/01/19, last labs 04/14/19 Creat 1.09, age 59, weight 111.9kg, based on specified criteria pt is on appropriate dosage of Eliquis 5mg  BID.  Will refill rx.

## 2019-05-19 ENCOUNTER — Other Ambulatory Visit: Payer: Self-pay | Admitting: Internal Medicine

## 2019-05-21 ENCOUNTER — Other Ambulatory Visit: Payer: Self-pay | Admitting: Internal Medicine

## 2019-05-21 DIAGNOSIS — Z1211 Encounter for screening for malignant neoplasm of colon: Secondary | ICD-10-CM

## 2019-06-15 ENCOUNTER — Other Ambulatory Visit: Payer: Self-pay

## 2019-06-15 MED ORDER — LOSARTAN POTASSIUM 25 MG PO TABS: 25.0000 mg | ORAL_TABLET | Freq: Every day | ORAL | 2 refills | Status: DC

## 2019-06-16 ENCOUNTER — Other Ambulatory Visit: Payer: Self-pay | Admitting: Physician Assistant

## 2019-06-17 DIAGNOSIS — Z1212 Encounter for screening for malignant neoplasm of rectum: Secondary | ICD-10-CM | POA: Diagnosis not present

## 2019-06-17 DIAGNOSIS — Z1211 Encounter for screening for malignant neoplasm of colon: Secondary | ICD-10-CM | POA: Diagnosis not present

## 2019-06-18 ENCOUNTER — Other Ambulatory Visit: Payer: Self-pay

## 2019-06-18 ENCOUNTER — Encounter: Payer: Self-pay | Admitting: Internal Medicine

## 2019-06-18 ENCOUNTER — Ambulatory Visit: Payer: BC Managed Care – PPO | Admitting: Internal Medicine

## 2019-06-18 VITALS — BP 120/72 | HR 68 | Temp 98.4°F | Ht 65.5 in | Wt 253.4 lb

## 2019-06-18 DIAGNOSIS — Z6841 Body Mass Index (BMI) 40.0 and over, adult: Secondary | ICD-10-CM | POA: Diagnosis not present

## 2019-06-18 DIAGNOSIS — N951 Menopausal and female climacteric states: Secondary | ICD-10-CM

## 2019-06-18 NOTE — Patient Instructions (Addendum)
Rybelsus  Consider for insulin resistance

## 2019-06-18 NOTE — Progress Notes (Signed)
This visit occurred during the SARS-CoV-2 public health emergency.  Safety protocols were in place, including screening questions prior to the visit, additional usage of staff PPE, and extensive cleaning of exam room while observing appropriate contact time as indicated for disinfecting solutions.  Subjective:     Patient ID: Monica Morgan , female    DOB: 02-21-1961 , 59 y.o.   MRN: [id number]   Chief Complaint  Patient presents with  . Hormones f/u    HPI  She presents today for f/u BHRT.  She reports compliance with estriol vaginal cream and progesterone cream. She feels fine, has no new complaints.     Past Medical History:  Diagnosis Date  . Arthritis    "probably in my knees; maybe in my hands" (04/11/2017)  . Asthma attack 10/2015 X 1  . Atrial flutter ([place]) 10/2015  . Heart murmur    "comes and goes" (04/11/2017)  . Hepatitis 1972   "don't know what kind"  . High cholesterol   . Hypertension   . Hypothyroidism   . Malignant melanoma of leg ([place])    "right thigh"  . PAF (paroxysmal atrial fibrillation) ([place])   . Pneumonia 1966; 1967   "left lung collapsed one of these times"  . Psoriasis   . Rosacea   . Vitamin D deficiency      Family History  Problem Relation Age of Onset  . Heart disease Mother   . Hypertension Mother   . Heart attack Mother   . Heart disease Father   . Diabetes Father   . Hypertension Father   . Heart attack Brother   . Colon cancer Neg Hx   . Stroke Neg Hx      Current Outpatient Medications:  .  Ascorbic Acid (VITAMIN C) 500 MG CHEW, Chew by mouth. 2 times per day, Disp: , Rfl:  .  augmented betamethasone dipropionate (DIPROLENE-AF) 0.05 % cream, APPLY VERY SPARINGLY TO PSORIASIS RASH (DO NOT USE ON FACE ! ), Disp: 50 g, Rfl: 5 .  Calcium 600-400 MG-UNIT CHEW, Chew 1 each by mouth 2 (two) times daily. , Disp: , Rfl:  .  Cholecalciferol (VITAMIN D PO), Take 10,000 Units by mouth daily. , Disp: , Rfl:  .  Coenzyme Q10 (CO Q10) 100  MG CAPS, Take 100 mg by mouth daily. , Disp: , Rfl:  .  diltiazem (CARDIZEM CD) 120 MG 24 hr capsule, Take 1 capsule (120 mg total) by mouth daily., Disp: 90 capsule, Rfl: 3 .  ELIQUIS 5 MG TABS tablet, TAKE 1 TABLET BY MOUTH TWICE A DAY, Disp: 60 tablet, Rfl: 5 .  ezetimibe (ZETIA) 10 MG tablet, TAKE 1 TABLET DAILY FOR CHOLESTREROL, Disp: 90 tablet, Rfl: 3 .  flecainide (TAMBOCOR) 100 MG tablet, TAKE 1 TABLET BY MOUTH TWICE A DAY, Disp: 180 tablet, Rfl: 2 .  furosemide (LASIX) 20 MG tablet, Take 1 tablet Daily for Fluid Retention (Patient taking differently: as needed. Take 1 tablet Daily for Fluid Retention), Disp: 90 tablet, Rfl: 1 .  levothyroxine (SYNTHROID) 50 MCG tablet, Take 1 tablet daily on an empty stomach with only water for 30 minutes & no Antacid meds, Calcium or Magnesium for 4 hours & avoid Biotin, Disp: 90 tablet, Rfl: 3 .  loratadine (CLARITIN) 10 MG tablet, TAKE 10 MG BY MOUTH DAILY AS NEEDED FOR ALLERGY RELIEF, Disp: , Rfl: 0 .  losartan (COZAAR) 25 MG tablet, Take 1 tablet (25 mg total) by mouth daily., Disp: 90 tablet, Rfl: 2 .  Magnesium 500 MG TABS, Take 500 mg by mouth every evening. , Disp: , Rfl:  .  montelukast (SINGULAIR) 10 MG tablet, Take 1 tablet Daily for Allergies, Disp: 90 tablet, Rfl: 0 .  Multiple Vitamin (MULTIVITAMIN WITH MINERALS) TABS tablet, Take 1 tablet by mouth daily., Disp: , Rfl:  .  NONFORMULARY OR COMPOUNDED ITEM, Place 1 each vaginally See admin instructions. Estriol/testosterone 0.25-0.25 vaginal suppository compound. Insert vaginally on Sunday, Monday and Wednesday, Disp: , Rfl:  .  NONFORMULARY OR COMPOUNDED ITEM, Apply 1 each topically See admin instructions. Progesterone 8%/17ml Apply 0.19mls topically every day except Saturday., Disp: , Rfl:  .  OVER THE COUNTER MEDICATION, in the morning and at bedtime. Taking Ferrous Sulfate 325 mg daily., Disp: , Rfl:  .  Polyethyl Glycol-Propyl Glycol (SYSTANE OP), Place 1 drop into both eyes daily., Disp: ,  Rfl:  .  Probiotic Product (PROBIOTIC DAILY PO), Take 1 tablet by mouth daily., Disp: , Rfl:  .  Turmeric 500 MG CAPS, Take 500 mg by mouth 2 (two) times daily., Disp: , Rfl:    Allergies  Allergen Reactions  . Lipitor [Atorvastatin] Other (See Comments)    "neck pain"  . Metoprolol Other (See Comments)    DIDN'T WORK FOR PATIENT AND GAINED WEIGHT MIGHT HAVE BEEN TAKEN WITH RYTHMOL  . Phentermine Other (See Comments)    Did not help, Insomnia   . Rythmol [Propafenone] Other (See Comments)    "weight gain"  . Sulfa Drugs Cross Reactors Other (See Comments)    "soreness all over"  . Lisinopril Cough     Review of Systems  Constitutional: Negative.   Respiratory: Negative.   Cardiovascular: Negative.   Gastrointestinal: Negative.   Neurological: Negative.   Psychiatric/Behavioral: Negative.      Today's Vitals   06/18/19 1617  BP: 120/72  Pulse: 68  Temp: 98.4 F (36.9 C)  TempSrc: Oral  Weight: 253 lb 6.4 oz (114.9 kg)  Height: 5' 5.5" (1.664 m)   Body mass index is 41.53 kg/m.   Wt Readings from Last 3 Encounters:  06/18/19 253 lb 6.4 oz (114.9 kg)  04/14/19 246 lb 12.8 oz (111.9 kg)  01/05/19 249 lb 9.6 oz (113.2 kg)     Objective:  Physical Exam Vitals and nursing note reviewed.  Constitutional:      Appearance: Normal appearance. She is obese.  HENT:     Head: Normocephalic and atraumatic.  Cardiovascular:     Rate and Rhythm: Normal rate and regular rhythm.     Heart sounds: Normal heart sounds.  Pulmonary:     Effort: Pulmonary effort is normal.     Breath sounds: Normal breath sounds.  Skin:    General: Skin is warm.  Neurological:     General: No focal deficit present.     Mental Status: She is alert.  Psychiatric:        Mood and Affect: Mood normal.        Behavior: Behavior normal.         Assessment And Plan:     1. Female climacteric state  Chronic. She will continue with current BHRT regimen. She will rto in four months for  re-evaluation. She promises to get blood results from her PCP so they can be abstracted into her chart.   2. Class 3 severe obesity due to excess calories with serious comorbidity and body mass index (BMI) of 40.0 to 44.9 in adult (HCC)  BMI 41. She is encouraged to incorporate  more exercise into her daily routine. Encouraged to aim for BMI less than 35 to decrease cardiac risk.    Maximino Greenland, MD    THE PATIENT IS ENCOURAGED TO PRACTICE SOCIAL DISTANCING DUE TO THE COVID-19 PANDEMIC.

## 2019-06-23 LAB — COLOGUARD: COLOGUARD: NEGATIVE

## 2019-06-24 ENCOUNTER — Telehealth: Payer: Self-pay | Admitting: *Deleted

## 2019-06-24 NOTE — Telephone Encounter (Signed)
Left a message to inform the the patient of normal Cologuard results and 3 year follow up.

## 2019-07-20 ENCOUNTER — Encounter: Payer: Self-pay | Admitting: Internal Medicine

## 2019-07-20 ENCOUNTER — Ambulatory Visit: Payer: BC Managed Care – PPO | Admitting: Internal Medicine

## 2019-07-20 NOTE — Patient Instructions (Signed)

## 2019-07-20 NOTE — Progress Notes (Signed)
History of Present Illness:       This very nice 59 y.o. DWF  presents for 3 month follow up with HTN, HLD, Pre-Diabetes and Vitamin D Deficiency.       Patient is treated for HTN (2013)  & BP has been controlled at home.  She had ablations in 2017 & 2018 by Dr Curt Bears  for pAfib & A Flutter and she remains on Eliquis & Diltiazem for CHA2DsVASc 2.   Today's BP is at goal - 110/62. Patient has had no complaints of any cardiac type chest pain, palpitations, dyspnea / orthopnea / PND, dizziness, claudication, or dependent edema.      Patient's HLD is controlled with diet & Ezetimibe.  She has hx/o intolerance to Lipitor.  Patient denies myalgias or other med SE's. Last Lipids were at goal except elevated Trig's:  Lab Results  Component Value Date   CHOL 170 04/14/2019   HDL 51 04/14/2019   LDLCALC 86 04/14/2019   TRIG 235 (H) 04/14/2019   CHOLHDL 3.3 04/14/2019   Also, the patient has Morbid Obesity (BMI 41.5+) and is expectantly monitored for pre_DM / Insulin Resistance (A1c 5.7%  And elev Insulin 101.9 / Dec 2020). She has had no symptoms of reactive hypoglycemia, diabetic polys, paresthesias or visual blurring.  Last A1c was near goal:  Lab Results  Component Value Date   HGBA1C 5.7 (H) 04/14/2019        In 2014,  patient was dx'd Hypothyroid  & has been on thyroid replacement since. Thyroid U/S in Sept found  a small Rt inf  Thyroid nodule & is recommended annual f/u for 5 years - due in Oct 2021.           Further, the patient also has history of Vitamin D Deficiency and supplements vitamin D without any suspected side-effects. Last vitamin D was at goal:  Lab Results  Component Value Date   VD25OH 99 04/14/2019    Current Outpatient Medications on File Prior to Visit  Medication Sig  . Ascorbic Acid (VITAMIN C) 500 MG CHEW Chew by mouth. 2 times per day  . augmented betamethasone dipropionate (DIPROLENE-AF) 0.05 % cream APPLY VERY SPARINGLY TO PSORIASIS RASH (DO NOT  USE ON FACE ! )  . Calcium 600-400 MG-UNIT CHEW Chew 1 each by mouth 2 (two) times daily.   . Cholecalciferol (VITAMIN D PO) Take 10,000 Units by mouth daily.   . Coenzyme Q10 (CO Q10) 100 MG CAPS Take 100 mg by mouth daily.   Marland Kitchen diltiazem (CARDIZEM CD) 120 MG 24 hr capsule Take 1 capsule (120 mg total) by mouth daily.  Marland Kitchen ELIQUIS 5 MG TABS tablet TAKE 1 TABLET BY MOUTH TWICE A DAY  . ezetimibe (ZETIA) 10 MG tablet TAKE 1 TABLET DAILY FOR CHOLESTREROL  . flecainide (TAMBOCOR) 100 MG tablet TAKE 1 TABLET BY MOUTH TWICE A DAY  . furosemide (LASIX) 20 MG tablet Take 1 tablet Daily for Fluid Retention (Patient taking differently: as needed. Take 1 tablet Daily for Fluid Retention)  . levothyroxine (SYNTHROID) 50 MCG tablet Take 1 tablet daily on an empty stomach with only water for 30 minutes & no Antacid meds, Calcium or Magnesium for 4 hours & avoid Biotin  . loratadine (CLARITIN) 10 MG tablet TAKE 10 MG BY MOUTH DAILY AS NEEDED FOR ALLERGY RELIEF  . losartan (COZAAR) 25 MG tablet Take 1 tablet (25 mg total) by mouth daily.  . Magnesium 500 MG TABS Take  500 mg by mouth every evening.   . montelukast (SINGULAIR) 10 MG tablet Take 1 tablet Daily for Allergies  . Multiple Vitamin (MULTIVITAMIN WITH MINERALS) TABS tablet Take 1 tablet by mouth daily.  . NONFORMULARY OR COMPOUNDED ITEM Place 1 each vaginally See admin instructions. Estriol/testosterone 0.25-0.25 vaginal suppository compound. Insert vaginally on Sunday, Monday and Wednesday  . NONFORMULARY OR COMPOUNDED ITEM Apply 1 each topically See admin instructions. Progesterone 8%/58ml Apply 0.49mls topically every day except Saturday.  Marland Kitchen OVER THE COUNTER MEDICATION in the morning and at bedtime. Taking Ferrous Sulfate 325 mg daily.  Vladimir Faster Glycol-Propyl Glycol (SYSTANE OP) Place 1 drop into both eyes daily.  . Probiotic Product (PROBIOTIC DAILY PO) Take 1 tablet by mouth daily.  . Turmeric 500 MG CAPS Take 500 mg by mouth 2 (two) times daily.     No current facility-administered medications on file prior to visit.    Allergies  Allergen Reactions  . Lipitor [Atorvastatin] Other (See Comments)    "neck pain"  . Metoprolol Other (See Comments)    DIDN'T WORK FOR PATIENT AND GAINED WEIGHT MIGHT HAVE BEEN TAKEN WITH RYTHMOL  . Phentermine Other (See Comments)    Did not help, Insomnia   . Rythmol [Propafenone] Other (See Comments)    "weight gain"  . Sulfa Drugs Cross Reactors Other (See Comments)    "soreness all over"  . Lisinopril Cough    PMHx:   Past Medical History:  Diagnosis Date  . Arthritis    "probably in my knees; maybe in my hands" (04/11/2017)  . Asthma attack 10/2015 X 1  . Atrial flutter ([place]) 10/2015  . Heart murmur    "comes and goes" (04/11/2017)  . Hepatitis 1972   "don't know what kind"  . High cholesterol   . Hypertension   . Hypothyroidism   . Malignant melanoma of leg ([place])    "right thigh"  . PAF (paroxysmal atrial fibrillation) ([place])   . Pneumonia 1966; 1967   "left lung collapsed one of these times"  . Psoriasis   . Rosacea   . Vitamin D deficiency     Immunization History  Administered Date(s) Administered  . PFIZER SARS-COV-2 Vaccination 06/29/2019, 07/20/2019  . PPD Test 12/07/2013, 12/22/2014, 08/17/2016, 11/27/2017, 04/14/2019  . Tdap 06/23/2015    Past Surgical History:  Procedure Laterality Date  . A-FLUTTER ABLATION N/A 02/08/2017   Procedure: A-Flutter Ablation;  Surgeon: Constance Haw, MD;  Location: [place] [facility];  Service: Cardiovascular;  Laterality: N/A;  . ATRIAL FIBRILLATION ABLATION N/A 04/11/2017   Procedure: ATRIAL FIBRILLATION ABLATION;  Surgeon: Constance Haw, MD;  Location: [place] [facility];  Service: Cardiovascular;  Laterality: N/A;  . CARDIOVERSION N/A 10/25/2015   Procedure: CARDIOVERSION;  Surgeon: Sueanne Margarita, MD;  Location: [facility];  Service: Cardiovascular;  Laterality: N/A;  . [place]  . CESAREAN  SECTION WITH BILATERAL TUBAL LIGATION  1982  . ELECTROPHYSIOLOGIC STUDY N/A 01/13/2016   Procedure: Atrial Fibrillation Ablation;  Surgeon: Will Meredith Leeds, MD;  Location: [place];  Service: Cardiovascular;  Laterality: N/A;  . ENDOMETRIAL ABLATION  ~ 2012  . MELANOMA EXCISION Right 10/1975   outer thigh "malignant"  . REFRACTIVE SURGERY Bilateral 1990s  . TEE WITHOUT CARDIOVERSION N/A 10/25/2015   Procedure: TRANSESOPHAGEAL ECHOCARDIOGRAM (TEE);  Surgeon: Sueanne Margarita, MD;  Location: [facility];  Service: Cardiovascular;  Laterality: N/A;  . TEE WITHOUT CARDIOVERSION N/A 01/11/2016   Procedure: TRANSESOPHAGEAL ECHOCARDIOGRAM (TEE);  Surgeon: Josue Hector, MD;  Location: [place] [facility];  Service: Cardiovascular;  Laterality: N/A;  . TONSILLECTOMY  1967  . TUBAL LIGATION      FHx:    Reviewed / unchanged  SHx:    Reviewed / unchanged   Systems Review:  Constitutional: Denies fever, chills, wt changes, headaches, insomnia, fatigue, night sweats, change in appetite. Eyes: Denies redness, blurred vision, diplopia, discharge, itchy, watery eyes.  ENT: Denies discharge, congestion, post nasal drip, epistaxis, sore throat, earache, hearing loss, dental pain, tinnitus, vertigo, sinus pain, snoring.  CV: Denies chest pain, palpitations, irregular heartbeat, syncope, dyspnea, diaphoresis, orthopnea, PND, claudication or edema. Respiratory: denies cough, dyspnea, DOE, pleurisy, hoarseness, laryngitis, wheezing.  Gastrointestinal: Denies dysphagia, odynophagia, heartburn, reflux, water brash, abdominal pain or cramps, nausea, vomiting, bloating, diarrhea, constipation, hematemesis, melena, hematochezia  or hemorrhoids. Genitourinary: Denies dysuria, frequency, urgency, nocturia, hesitancy, discharge, hematuria or flank pain. Musculoskeletal: Denies arthralgias, myalgias, stiffness, jt. swelling, pain, limping or strain/sprain.  Skin: Denies pruritus, rash, hives, warts, acne, eczema  or change in skin lesion(s). Neuro: No weakness, tremor, incoordination, spasms, paresthesia or pain. Psychiatric: Denies confusion, memory loss or sensory loss. Endo: Denies change in weight, skin or hair change.  Heme/Lymph: No excessive bleeding, bruising or enlarged lymph nodes.  Physical Exam  BP 110/62   Pulse 72   Temp (!) 96.3 F (35.7 C)   Resp 16   Ht 5' 5.5" (1.664 m)   Wt 250 lb 3.2 oz (113.5 kg)   LMP 10/29/2013 (Approximate)   BMI 41.00 kg/m   Appears  Over nourished, well groomed  and in no distress.  Eyes: PERRLA, EOMs, conjunctiva no swelling or erythema. Sinuses: No frontal/maxillary tenderness ENT/Mouth: EAC's clear, TM's nl w/o erythema, bulging. Nares clear w/o erythema, swelling, exudates. Oropharynx clear without erythema or exudates. Oral hygiene is good. Tongue normal, non obstructing. Hearing intact.  Neck: Supple. Thyroid not palpable. Car 2+/2+ without bruits, nodes or JVD. Chest: Respirations nl with BS clear & equal w/o rales, rhonchi, wheezing or stridor.  Cor: Heart sounds normal w/ regular rate and rhythm without sig. murmurs, gallops, clicks or rubs. Peripheral pulses normal and equal  without edema.  Abdomen: Soft, rotund  & bowel sounds normal. Non-tender w/o guarding, rebound, hernias, masses or organomegaly.  Lymphatics: Unremarkable.  Musculoskeletal: Full ROM all peripheral extremities, joint stability, 5/5 strength and normal gait.  Skin: Warm, dry without exposed rashes, lesions or ecchymosis apparent.  Neuro: Cranial nerves intact, reflexes equal bilaterally. Sensory-motor testing grossly intact. Tendon reflexes grossly intact.  Pysch: Alert & oriented x 3.  Insight and judgement nl & appropriate. No ideations.  Assessment and Plan:  1. Essential hypertension  - Continue medication, monitor blood pressure at home.  - Continue DASH diet.  Reminder to go to the ER if any CP,  SOB, nausea, dizziness, severe HA, changes  vision/speech.  - CBC with Differential/Platelet - COMPLETE METABOLIC PANEL WITH GFR - Magnesium - TSH  2. Hyperlipidemia, mixed  - Continue diet/meds, exercise,& lifestyle modifications.  - Continue monitor periodic cholesterol/liver & renal functions   - Lipid panel - TSH  3. Abnormal glucose  - Continue diet, exercise  - Lifestyle modifications.  - Monitor appropriate labs.  - Hemoglobin A1c - Insulin, random  4. Vitamin D deficiency  - Continue supplementation.  - VITAMIN D 25 Hydroxy  5. Hypothyroidism, unspecified type  - TSH  6. Prediabetes  - Hemoglobin A1c - Insulin, random  7. Class 3 severe obesity due to excess  calories with serious comorbidity  and body mass index (BMI) of 40.0 to 44.9 in adult ([place])  8. Insulin resistance  - Hemoglobin A1c - Insulin, random  9. Paroxysmal atrial fibrillation (H[facility])  - TSH  10. Medication management  - CBC with Differential/Platelet - COMPLETE METABOLIC PANEL WITH GFR - Magnesium - Lipid panel - TSH - Hemoglobin A1c - Insulin, random - VITAMIN D 25 Hydroxy  11. Iron deficiency  - re-check iron panel         Discussed  regular exercise, BP monitoring, weight control to achieve/maintain BMI less than 25 and discussed med and SE's. Recommended labs to assess and monitor clinical status with further disposition pending results of labs.  I discussed the assessment and treatment plan with the patient. The patient was provided an opportunity to ask questions and all were answered. The patient agreed with the plan and demonstrated an understanding of the instructions.  I provided over 30 minutes of exam, counseling, chart review and  complex critical decision making.   Kirtland Bouchard, MD

## 2019-07-21 ENCOUNTER — Other Ambulatory Visit: Payer: Self-pay

## 2019-07-21 ENCOUNTER — Ambulatory Visit: Payer: BC Managed Care – PPO | Admitting: Internal Medicine

## 2019-07-21 VITALS — BP 110/62 | HR 72 | Temp 96.3°F | Resp 16 | Ht 65.5 in | Wt 250.2 lb

## 2019-07-21 DIAGNOSIS — Z79899 Other long term (current) drug therapy: Secondary | ICD-10-CM | POA: Diagnosis not present

## 2019-07-21 DIAGNOSIS — E039 Hypothyroidism, unspecified: Secondary | ICD-10-CM

## 2019-07-21 DIAGNOSIS — E782 Mixed hyperlipidemia: Secondary | ICD-10-CM | POA: Diagnosis not present

## 2019-07-21 DIAGNOSIS — R7309 Other abnormal glucose: Secondary | ICD-10-CM | POA: Diagnosis not present

## 2019-07-21 DIAGNOSIS — I10 Essential (primary) hypertension: Secondary | ICD-10-CM | POA: Diagnosis not present

## 2019-07-21 DIAGNOSIS — E559 Vitamin D deficiency, unspecified: Secondary | ICD-10-CM

## 2019-07-21 DIAGNOSIS — E8881 Metabolic syndrome: Secondary | ICD-10-CM

## 2019-07-21 DIAGNOSIS — R7303 Prediabetes: Secondary | ICD-10-CM

## 2019-07-21 DIAGNOSIS — I480 Paroxysmal atrial fibrillation: Secondary | ICD-10-CM

## 2019-07-21 DIAGNOSIS — Z6841 Body Mass Index (BMI) 40.0 and over, adult: Secondary | ICD-10-CM

## 2019-07-21 DIAGNOSIS — E611 Iron deficiency: Secondary | ICD-10-CM

## 2019-07-23 LAB — IRON, TOTAL/TOTAL IRON BINDING CAP
%SAT: 10 % (calc) — ABNORMAL LOW (ref 16–45)
Iron: 32 ug/dL — ABNORMAL LOW (ref 45–160)
TIBC: 327 mcg/dL (calc) (ref 250–450)

## 2019-07-23 LAB — CBC WITH DIFFERENTIAL/PLATELET
Absolute Monocytes: 843 cells/uL (ref 200–950)
Basophils Absolute: 62 cells/uL (ref 0–200)
Basophils Relative: 0.5 %
Eosinophils Absolute: 87 cells/uL (ref 15–500)
Eosinophils Relative: 0.7 %
HCT: 43.6 % (ref 35.0–45.0)
Hemoglobin: 14.4 g/dL (ref 11.7–15.5)
Lymphs Abs: 2207 cells/uL (ref 850–3900)
MCH: 29.3 pg (ref 27.0–33.0)
MCHC: 33 g/dL (ref 32.0–36.0)
MCV: 88.8 fL (ref 80.0–100.0)
MPV: 10 fL (ref 7.5–12.5)
Monocytes Relative: 6.8 %
Neutro Abs: 9201 cells/uL — ABNORMAL HIGH (ref 1500–7800)
Neutrophils Relative %: 74.2 %
Platelets: 308 10*3/uL (ref 140–400)
RBC: 4.91 10*6/uL (ref 3.80–5.10)
RDW: 13.1 % (ref 11.0–15.0)
Total Lymphocyte: 17.8 %
WBC: 12.4 10*3/uL — ABNORMAL HIGH (ref 3.8–10.8)

## 2019-07-23 LAB — COMPLETE METABOLIC PANEL WITH GFR
AG Ratio: 2 (calc) (ref 1.0–2.5)
ALT: 19 U/L (ref 6–29)
AST: 17 U/L (ref 10–35)
Albumin: 4.8 g/dL (ref 3.6–5.1)
Alkaline phosphatase (APISO): 52 U/L (ref 37–153)
BUN: 18 mg/dL (ref 7–25)
CO2: 28 mmol/L (ref 20–32)
Calcium: 10.1 mg/dL (ref 8.6–10.4)
Chloride: 101 mmol/L (ref 98–110)
Creat: 0.96 mg/dL (ref 0.50–0.99)
GFR, Est African American: 75 mL/min/{1.73_m2} (ref >=60)
GFR, Est Non African American: 64 mL/min/{1.73_m2} (ref >=60)
Globulin: 2.4 g/dL (calc) (ref 1.9–3.7)
Glucose, Bld: 95 mg/dL (ref 65–99)
Potassium: 4.6 mmol/L (ref 3.5–5.3)
Sodium: 139 mmol/L (ref 135–146)
Total Bilirubin: 0.4 mg/dL (ref 0.2–1.2)
Total Protein: 7.2 g/dL (ref 6.1–8.1)

## 2019-07-23 LAB — TEST AUTHORIZATION

## 2019-07-23 LAB — LIPID PANEL
Cholesterol: 177 mg/dL (ref <200)
HDL: 49 mg/dL — ABNORMAL LOW (ref >=50)
LDL Cholesterol (Calc): 100 mg/dL (calc) — ABNORMAL HIGH
Non-HDL Cholesterol (Calc): 128 mg/dL (calc) (ref <130)
Total CHOL/HDL Ratio: 3.6 (calc) (ref <5.0)
Triglycerides: 187 mg/dL — ABNORMAL HIGH (ref <150)

## 2019-07-23 LAB — VITAMIN D 25 HYDROXY (VIT D DEFICIENCY, FRACTURES): Vit D, 25-Hydroxy: 100 ng/mL (ref 30–100)

## 2019-07-23 LAB — HEMOGLOBIN A1C
Hgb A1c MFr Bld: 5.8 % of total Hgb — ABNORMAL HIGH (ref <5.7)
Mean Plasma Glucose: 120 (calc)
eAG (mmol/L): 6.6 (calc)

## 2019-07-23 LAB — MAGNESIUM: Magnesium: 2.3 mg/dL (ref 1.5–2.5)

## 2019-07-23 LAB — TSH: TSH: 2.26 mIU/L (ref 0.40–4.50)

## 2019-07-23 LAB — INSULIN, RANDOM: Insulin: 22.9 u[IU]/mL — ABNORMAL HIGH

## 2019-07-30 ENCOUNTER — Other Ambulatory Visit: Payer: Self-pay | Admitting: Internal Medicine

## 2019-07-30 DIAGNOSIS — J069 Acute upper respiratory infection, unspecified: Secondary | ICD-10-CM | POA: Diagnosis not present

## 2019-07-30 DIAGNOSIS — J029 Acute pharyngitis, unspecified: Secondary | ICD-10-CM | POA: Diagnosis not present

## 2019-07-30 DIAGNOSIS — Z03818 Encounter for observation for suspected exposure to other biological agents ruled out: Secondary | ICD-10-CM | POA: Diagnosis not present

## 2019-10-21 NOTE — Progress Notes (Signed)
FOLLOW UP  Assessment and Plan:   A.fib/flutter Managed by cardiology  On elequis for chadsvasc of 2 Rate controlled; continue verapamil and flecainide  Hypertension Above goal today - attributes to stress, controlled at home - advised restart losartan if remains above goal  Monitor blood pressure at home; patient to call if consistently greater than 130/80 Continue DASH diet.   Reminder to go to the ER if any CP, SOB, nausea, dizziness, severe HA, changes vision/speech, left arm numbness and tingling and jaw pain.  Cholesterol Currently near goal with zetia  Continue low cholesterol diet and exercise.  Check lipid panel.   Prediabetes Continue diet and exercise.  Perform daily foot/skin check, notify office of any concerning changes.  Check A1C q94m; defer today   Morbid Obesity - BMI 40 with co morbidities Long discussion about weight loss, diet, and exercise Recommended diet heavy in fruits and veggies and low in animal meats, cheeses, and dairy products, appropriate calorie intake Discussed ideal weight for height  She is working on high fiber diet, portions Didn't do well with phentermine or topiramate, interested in [place] after discussion - will order with savings card, 0.25 mg weekly x 4 weeks, then 0.5 mg weekly x 4 weeks Follow up in 4-6 weeks   Hypothyroidism/ nodular thyroid continue medications the same pending lab results reminded to take on an empty stomach 30-33mins before food.  check TSH level Due for follow up US Oct 2021- order placed to have scheduled  Vitamin D Def At goal at last visit; continue supplementation to maintain goal of 70-100 Defer Vit D level  Back pain - negative straight leg Prednisone was not prescribed,NSAIDs, muscle relaxer, RICE, and exercise given If not better follow up in office or will refer to PT/orthopedics. Follow-up in 4 weeks.  Diarrhea, presumed infectious Hold imodium, check pathogen panel No recent admission or  abx Then resume imodium can go higher in dose, try pepto bismol, soluble fiber Please go to the ER if you have any severe AB pain, unable to hold down food/water, blood in stool or vomit, chest pain, shortness of breath, or any worsening symptoms.    Continue diet and meds as discussed. Further disposition pending results of labs. Discussed med's effects and SE's.   Over 30 minutes of exam, counseling, chart review, and critical decision making was performed.   Future Appointments  Date Time Provider [place]  12/24/2019  4:00 PM Glendale Chard, MD [facility] None  01/27/2020  4:00 PM Unk Pinto, MD [facility] None  05/04/2020  3:00 PM Liane Comber, NP [facility] None    ----------------------------------------------------------------------------------------------------------------------  HPI 59 y.o. female  presents for 3 month follow up on hypertension, cholesterol, prediabetes, obesity, nodular thyroid and vitamin D deficiency.   She reports had 2 weeks of liquid diarrhea that started while on vacation in [facility], was having 6-8 episodes daily, did improve down to 3 episodes or so, but persistnetly liquid/loose abd doesn't seem to be improving further, has woken her up at night. Dark brown to green stools. She denies fever/chills, abdominal pain (mild abdominal cramping prior to episodes). No mucus, blood, nausea/emesis. Denies new meds/supplements, foods. Denies recent abx.   She also reports 3 days of mild left sided mid/lower back pain with intermittent radiation, ibuprofen is helping, doing heat/ice, denies numbness/tingling or weakness, loss of bowel or bladder control, intermittent mild pain improved with ibuprofen but requesting muscle relaxer which has helped in the past.   BMI is Body mass index is  40.64 kg/m., she has been working on diet and exercise, has been doing high fiber, fruits/veggies  Has tried phentermine and topamax but had very poor sleep quality   Very frustrated with lack of progress, interested in procedure or alternative medication Wt Readings from Last 3 Encounters:  10/22/19 248 lb (112.5 kg)  07/21/19 250 lb 3.2 oz (113.5 kg)  06/18/19 253 lb 6.4 oz (114.9 kg)   She has a. Fib and hx of flutter with RVR, had ablations in 2017/2018 by Dr Curt Bears.  She remains on Eliquis for CHA2DsVASc 2 Taking diltiazem 120 mg daily and flecainide Her blood pressure has been controlled at home (130/80s), today their BP is BP: (!) 150/84 hasn't been taking losartan 50 mg, will restart if remains up  She does workout. She denies chest pain, shortness of breath, dizziness.   She is on cholesterol medication Zetia and denies myalgias. Her cholesterol is not at goal. The cholesterol last visit was:   Lab Results  Component Value Date   CHOL 177 07/21/2019   HDL 49 (L) 07/21/2019   LDLCALC 100 (H) 07/21/2019   TRIG 187 (H) 07/21/2019   CHOLHDL 3.6 07/21/2019    She has been working on diet and exercise for prediabetes, and denies increased appetite, nausea, paresthesia of the feet, polydipsia, polyuria and visual disturbances. Last A1C in the office was:  Lab Results  Component Value Date   HGBA1C 5.8 (H) 07/21/2019   Patient is on Vitamin D supplement.   Lab Results  Component Value Date   VD25OH 100 07/21/2019     She is on thyroid medication since 2014; thyroid ultrasound in 12/2018 found a small inferior R lobe nodule recommended for annual [place] follow up, due in Oct 2021  Her medication was not changed last visit, taking 50 mcg for many years.  Lab Results  Component Value Date   TSH 2.26 07/21/2019  .  Lab Results  Component Value Date   WBC 12.4 (H) 07/21/2019   HGB 14.4 07/21/2019   HCT 43.6 07/21/2019   MCV 88.8 07/21/2019   PLT 308 07/21/2019   She is on iron supplement, 65 mg slow release BID since last visit:  Lab Results  Component Value Date   IRON 32 (L) 07/21/2019   TIBC 327 07/21/2019      Current  Medications:  Current Outpatient Medications on File Prior to Visit  Medication Sig  . Ascorbic Acid (VITAMIN C) 500 MG CHEW Chew by mouth. 2 times per day  . augmented betamethasone dipropionate (DIPROLENE-AF) 0.05 % cream APPLY VERY SPARINGLY TO PSORIASIS RASH (DO NOT USE ON FACE ! )  . Calcium 600-400 MG-UNIT CHEW Chew 1 each by mouth 2 (two) times daily.   . Cholecalciferol (VITAMIN D PO) Take 10,000 Units by mouth daily.   . Coenzyme Q10 (CO Q10) 100 MG CAPS Take 100 mg by mouth daily.   Marland Kitchen diltiazem (CARDIZEM CD) 120 MG 24 hr capsule Take 1 capsule (120 mg total) by mouth daily.  Marland Kitchen ELIQUIS 5 MG TABS tablet TAKE 1 TABLET BY MOUTH TWICE A DAY  . ezetimibe (ZETIA) 10 MG tablet TAKE 1 TABLET DAILY FOR CHOLESTREROL  . flecainide (TAMBOCOR) 100 MG tablet TAKE 1 TABLET BY MOUTH TWICE A DAY  . furosemide (LASIX) 20 MG tablet Take 1 tablet Daily for Fluid Retention (Patient taking differently: as needed. Take 1 tablet Daily for Fluid Retention)  . levothyroxine (SYNTHROID) 50 MCG tablet Take 1 tablet daily on an empty stomach  with only water for 30 minutes & no Antacid meds, Calcium or Magnesium for 4 hours & avoid Biotin  . loratadine (CLARITIN) 10 MG tablet TAKE 10 MG BY MOUTH DAILY AS NEEDED FOR ALLERGY RELIEF  . losartan (COZAAR) 25 MG tablet Take 1 tablet (25 mg total) by mouth daily.  . Magnesium 500 MG TABS Take 500 mg by mouth every evening.   . montelukast (SINGULAIR) 10 MG tablet Take 1 tablet Daily for Allergies  . Multiple Vitamin (MULTIVITAMIN WITH MINERALS) TABS tablet Take 1 tablet by mouth daily.  . NONFORMULARY OR COMPOUNDED ITEM Place 1 each vaginally See admin instructions. Estriol/testosterone 0.25-0.25 vaginal suppository compound. Insert vaginally on Sunday, Monday and Wednesday  . NONFORMULARY OR COMPOUNDED ITEM Apply 1 each topically See admin instructions. Progesterone 8%/37ml Apply 0.95mls topically every day except Saturday.  Marland Kitchen OVER THE COUNTER MEDICATION in the morning  and at bedtime. Taking Ferrous Sulfate 325 mg daily.  Vladimir Faster Glycol-Propyl Glycol (SYSTANE OP) Place 1 drop into both eyes daily.  . Turmeric 500 MG CAPS Take 500 mg by mouth 2 (two) times daily.  . Probiotic Product (PROBIOTIC DAILY PO) Take 1 tablet by mouth daily. (Patient not taking: Reported on 10/22/2019)   No current facility-administered medications on file prior to visit.     Allergies:  Allergies  Allergen Reactions  . Lipitor [Atorvastatin] Other (See Comments)    "neck pain"  . Metoprolol Other (See Comments)    DIDN'T WORK FOR PATIENT AND GAINED WEIGHT MIGHT HAVE BEEN TAKEN WITH RYTHMOL  . Phentermine Other (See Comments)    Did not help, Insomnia   . Rythmol [Propafenone] Other (See Comments)    "weight gain"  . Sulfa Drugs Cross Reactors Other (See Comments)    "soreness all over"  . Lisinopril Cough     Medical History:  Past Medical History:  Diagnosis Date  . Arthritis    "probably in my knees; maybe in my hands" (04/11/2017)  . Asthma attack 10/2015 X 1  . Atrial flutter ([place]) 10/2015  . Heart murmur    "comes and goes" (04/11/2017)  . Hepatitis 1972   "don't know what kind"  . High cholesterol   . Hypertension   . Hypothyroidism   . Malignant melanoma of leg ([place])    "right thigh"  . PAF (paroxysmal atrial fibrillation) ([place])   . Pneumonia 1966; 1967   "left lung collapsed one of these times"  . Psoriasis   . Rosacea   . Vitamin D deficiency    Family history- Reviewed and unchanged Social history- Reviewed and unchanged   Review of Systems:  Review of Systems  Constitutional: Positive for malaise/fatigue. Negative for weight loss.  HENT: Negative for hearing loss and tinnitus.   Eyes: Negative for blurred vision and double vision.  Respiratory: Negative for cough, shortness of breath and wheezing.   Cardiovascular: Negative for chest pain, palpitations, orthopnea, claudication and leg swelling.  Gastrointestinal: Negative for  abdominal pain, blood in stool, constipation, diarrhea, heartburn, melena, nausea and vomiting.  Genitourinary: Negative.   Musculoskeletal: Positive for back pain (left lower with mild intermittent radicular). Negative for falls, joint pain and myalgias.  Skin: Negative for rash.  Neurological: Negative for dizziness, tingling, sensory change, focal weakness, weakness and headaches.  Endo/Heme/Allergies: Negative for polydipsia.  Psychiatric/Behavioral: Negative.   All other systems reviewed and are negative.     Physical Exam: BP (!) 150/84   Pulse 62   Temp (!) 97.3 F (36.3 C)  Ht 5' 5.5" (1.664 m)   Wt 248 lb (112.5 kg)   LMP 10/29/2013 (Approximate)   SpO2 97%   BMI 40.64 kg/m  Wt Readings from Last 3 Encounters:  10/22/19 248 lb (112.5 kg)  07/21/19 250 lb 3.2 oz (113.5 kg)  06/18/19 253 lb 6.4 oz (114.9 kg)   General Appearance: Well nourished, in no apparent distress. Eyes: PERRLA, EOMs, conjunctiva no swelling or erythema Sinuses: No Frontal/maxillary tenderness ENT/Mouth: Ext aud canals clear, TMs without erythema, bulging. No erythema, swelling, or exudate on post pharynx.  Tonsils not swollen or erythematous. Hearing normal.  Neck: Supple, thyroid with R sided nodule Respiratory: Respiratory effort normal, BS equal bilaterally without rales, rhonchi, wheezing or stridor.  Cardio: RRR with no MRGs. Brisk peripheral pulses without edema.  Abdomen: Soft, morbidly obese, + BS.  Non tender, no guarding, rebound, hernias, masses.  Lymphatics: Non tender without lymphadenopathy.  Musculoskeletal: Full ROM, 5/5 strength, Normal gait, no spinous tenderness, no muscle spasm, neg straight leg raise.  Skin: Warm, dry without rashes, lesions, ecchymosis.  Neuro: Cranial nerves intact. No cerebellar symptoms.  Psych: Awake and oriented X 3, normal affect, Insight and Judgment appropriate.    Izora Ribas, NP 5:33 PM [facility]

## 2019-10-22 ENCOUNTER — Encounter: Payer: Self-pay | Admitting: Adult Health

## 2019-10-22 ENCOUNTER — Other Ambulatory Visit: Payer: Self-pay

## 2019-10-22 ENCOUNTER — Ambulatory Visit: Payer: BC Managed Care – PPO | Admitting: Adult Health

## 2019-10-22 VITALS — BP 150/84 | HR 62 | Temp 97.3°F | Ht 65.5 in | Wt 248.0 lb

## 2019-10-22 DIAGNOSIS — E782 Mixed hyperlipidemia: Secondary | ICD-10-CM

## 2019-10-22 DIAGNOSIS — Z79899 Other long term (current) drug therapy: Secondary | ICD-10-CM | POA: Diagnosis not present

## 2019-10-22 DIAGNOSIS — I10 Essential (primary) hypertension: Secondary | ICD-10-CM

## 2019-10-22 DIAGNOSIS — I4819 Other persistent atrial fibrillation: Secondary | ICD-10-CM | POA: Diagnosis not present

## 2019-10-22 DIAGNOSIS — E039 Hypothyroidism, unspecified: Secondary | ICD-10-CM

## 2019-10-22 DIAGNOSIS — E559 Vitamin D deficiency, unspecified: Secondary | ICD-10-CM

## 2019-10-22 DIAGNOSIS — I4892 Unspecified atrial flutter: Secondary | ICD-10-CM

## 2019-10-22 DIAGNOSIS — E042 Nontoxic multinodular goiter: Secondary | ICD-10-CM

## 2019-10-22 DIAGNOSIS — R197 Diarrhea, unspecified: Secondary | ICD-10-CM

## 2019-10-22 DIAGNOSIS — R7309 Other abnormal glucose: Secondary | ICD-10-CM

## 2019-10-22 DIAGNOSIS — D509 Iron deficiency anemia, unspecified: Secondary | ICD-10-CM | POA: Diagnosis not present

## 2019-10-22 MED ORDER — CYCLOBENZAPRINE HCL 5 MG PO TABS
5.0000 mg | ORAL_TABLET | Freq: Three times a day (TID) | ORAL | 0 refills | Status: DC | PRN
Start: 2019-10-22 — End: 2020-07-10

## 2019-10-22 MED ORDER — FLUTICASONE PROPIONATE 50 MCG/ACT NA SUSP: 1.0000 | Freq: Every day | NASAL | 1 refills | Status: DC

## 2019-10-23 ENCOUNTER — Other Ambulatory Visit: Payer: Self-pay | Admitting: Adult Health

## 2019-10-23 LAB — COMPLETE METABOLIC PANEL WITH GFR
AG Ratio: 1.8 (calc) (ref 1.0–2.5)
ALT: 16 U/L (ref 6–29)
AST: 17 U/L (ref 10–35)
Albumin: 4.6 g/dL (ref 3.6–5.1)
Alkaline phosphatase (APISO): 54 U/L (ref 37–153)
BUN: 23 mg/dL (ref 7–25)
CO2: 28 mmol/L (ref 20–32)
Calcium: 9.9 mg/dL (ref 8.6–10.4)
Chloride: 102 mmol/L (ref 98–110)
Creat: 0.86 mg/dL (ref 0.50–0.99)
GFR, Est African American: 85 mL/min/{1.73_m2} (ref >=60)
GFR, Est Non African American: 73 mL/min/{1.73_m2} (ref >=60)
Globulin: 2.5 g/dL (calc) (ref 1.9–3.7)
Glucose, Bld: 102 mg/dL — ABNORMAL HIGH (ref 65–99)
Potassium: 4.6 mmol/L (ref 3.5–5.3)
Sodium: 141 mmol/L (ref 135–146)
Total Bilirubin: 0.5 mg/dL (ref 0.2–1.2)
Total Protein: 7.1 g/dL (ref 6.1–8.1)

## 2019-10-23 LAB — CBC WITH DIFFERENTIAL/PLATELET
Absolute Monocytes: 763 cells/uL (ref 200–950)
Basophils Absolute: 76 cells/uL (ref 0–200)
Basophils Relative: 0.7 %
Eosinophils Absolute: 174 cells/uL (ref 15–500)
Eosinophils Relative: 1.6 %
HCT: 41.8 % (ref 35.0–45.0)
Hemoglobin: 13.7 g/dL (ref 11.7–15.5)
Lymphs Abs: 3543 cells/uL (ref 850–3900)
MCH: 28.7 pg (ref 27.0–33.0)
MCHC: 32.8 g/dL (ref 32.0–36.0)
MCV: 87.4 fL (ref 80.0–100.0)
MPV: 10.4 fL (ref 7.5–12.5)
Monocytes Relative: 7 %
Neutro Abs: 6344 cells/uL (ref 1500–7800)
Neutrophils Relative %: 58.2 %
Platelets: 326 10*3/uL (ref 140–400)
RBC: 4.78 10*6/uL (ref 3.80–5.10)
RDW: 12.8 % (ref 11.0–15.0)
Total Lymphocyte: 32.5 %
WBC: 10.9 10*3/uL — ABNORMAL HIGH (ref 3.8–10.8)

## 2019-10-23 LAB — LIPID PANEL
Cholesterol: 172 mg/dL (ref <200)
HDL: 45 mg/dL — ABNORMAL LOW (ref >=50)
LDL Cholesterol (Calc): 95 mg/dL (calc)
Non-HDL Cholesterol (Calc): 127 mg/dL (calc) (ref <130)
Total CHOL/HDL Ratio: 3.8 (calc) (ref <5.0)
Triglycerides: 228 mg/dL — ABNORMAL HIGH (ref <150)

## 2019-10-23 LAB — IRON,TIBC AND FERRITIN PANEL
%SAT: 22 % (calc) (ref 16–45)
Ferritin: 107 ng/mL (ref 16–288)
Iron: 71 ug/dL (ref 45–160)
TIBC: 325 mcg/dL (calc) (ref 250–450)

## 2019-10-23 LAB — MAGNESIUM: Magnesium: 2.5 mg/dL (ref 1.5–2.5)

## 2019-10-23 LAB — TSH: TSH: 2.81 mIU/L (ref 0.40–4.50)

## 2019-10-23 NOTE — Patient Instructions (Signed)
Semaglutide injection solution What is this medicine? SEMAGLUTIDE (Sem a GLOO tide) is used to improve blood sugar control in adults with type 2 diabetes or for weight management. This medicine may be used with other diabetes medicines. This drug may also reduce the risk of heart attack or stroke if you have type 2 diabetes and risk factors for heart disease. This medicine may be used for other purposes; ask your health care provider or pharmacist if you have questions. COMMON BRAND NAME(S): OZEMPIC, WEGOVY What should I tell my health care provider before I take this medicine? They need to know if you have any of these conditions:  endocrine tumors (MEN 2) or if someone in your family had these tumors  eye disease, vision problems  history of pancreatitis  kidney disease  stomach problems  thyroid cancer or if someone in your family had thyroid cancer  an unusual or allergic reaction to semaglutide, other medicines, foods, dyes, or preservatives  pregnant or trying to get pregnant  breast-feeding How should I use this medicine? This medicine is for injection under the skin of your upper leg (thigh), stomach area, or upper arm. It is given once every week (every 7 days). You will be taught how to prepare and give this medicine. Use exactly as directed. Take your medicine at regular intervals. Do not take it more often than directed. If you use this medicine with insulin, you should inject this medicine and the insulin separately. Do not mix them together. Do not give the injections right next to each other. Change (rotate) injection sites with each injection. It is important that you put your used needles and syringes in a special sharps container. Do not put them in a trash can. If you do not have a sharps container, call your pharmacist or healthcare provider to get one. A special MedGuide will be given to you by the pharmacist with each prescription and refill. Be sure to read this  information carefully each time. This drug comes with INSTRUCTIONS FOR USE. Ask your pharmacist for directions on how to use this drug. Read the information carefully. Talk to your pharmacist or health care provider if you have questions. Talk to your pediatrician regarding the use of this medicine in children. Special care may be needed. Overdosage: If you think you have taken too much of this medicine contact a poison control center or emergency room at once. NOTE: This medicine is only for you. Do not share this medicine with others. What if I miss a dose? If you miss a dose, take it as soon as you can within 5 days after the missed dose. Then take your next dose at your regular weekly time. If it has been longer than 5 days after the missed dose, do not take the missed dose. Take the next dose at your regular time. Do not take double or extra doses. If you have questions about a missed dose, contact your health care provider for advice. What may interact with this medicine?  other medicines for diabetes Many medications may cause changes in blood sugar, these include:  alcohol containing beverages  antiviral medicines for HIV or AIDS  aspirin and aspirin-like drugs  certain medicines for blood pressure, heart disease, irregular heart beat  chromium  diuretics  female hormones, such as estrogens or progestins, birth control pills  fenofibrate  gemfibrozil  isoniazid  lanreotide  female hormones or anabolic steroids  MAOIs like Carbex, Eldepryl, Marplan, Nardil, and Parnate  medicines for  weight loss  medicines for allergies, asthma, cold, or cough  medicines for depression, anxiety, or psychotic disturbances  niacin  nicotine  NSAIDs, medicines for pain and inflammation, like ibuprofen or naproxen  octreotide  pasireotide  pentamidine  phenytoin  probenecid  quinolone antibiotics such as ciprofloxacin, levofloxacin, ofloxacin  some herbal dietary  supplements  steroid medicines such as prednisone or cortisone  sulfamethoxazole; trimethoprim  thyroid hormones Some medications can hide the warning symptoms of low blood sugar (hypoglycemia). You may need to monitor your blood sugar more closely if you are taking one of these medications. These include:  beta-blockers, often used for high blood pressure or heart problems (examples include atenolol, metoprolol, propranolol)  clonidine  guanethidine  reserpine This list may not describe all possible interactions. Give your health care provider a list of all the medicines, herbs, non-prescription drugs, or dietary supplements you use. Also tell them if you smoke, drink alcohol, or use illegal drugs. Some items may interact with your medicine. What should I watch for while using this medicine? Visit your doctor or health care professional for regular checks on your progress. Drink plenty of fluids while taking this medicine. Check with your doctor or health care professional if you get an attack of severe diarrhea, nausea, and vomiting. The loss of too much body fluid can make it dangerous for you to take this medicine. A test called the HbA1C (A1C) will be monitored. This is a simple blood test. It measures your blood sugar control over the last 2 to 3 months. You will receive this test every 3 to 6 months. Learn how to check your blood sugar. Learn the symptoms of low and high blood sugar and how to manage them. Always carry a quick-source of sugar with you in case you have symptoms of low blood sugar. Examples include hard sugar candy or glucose tablets. Make sure others know that you can choke if you eat or drink when you develop serious symptoms of low blood sugar, such as seizures or unconsciousness. They must get medical help at once. Tell your doctor or health care professional if you have high blood sugar. You might need to change the dose of your medicine. If you are sick or  exercising more than usual, you might need to change the dose of your medicine. Do not skip meals. Ask your doctor or health care professional if you should avoid alcohol. Many nonprescription cough and cold products contain sugar or alcohol. These can affect blood sugar. Pens should never be shared. Even if the needle is changed, sharing may result in passing of viruses like hepatitis or HIV. Wear a medical ID bracelet or chain, and carry a card that describes your disease and details of your medicine and dosage times. Do not become pregnant while taking this medicine. Women should inform their doctor if they wish to become pregnant or think they might be pregnant. There is a potential for serious side effects to an unborn child. Talk to your health care professional or pharmacist for more information. What side effects may I notice from receiving this medicine? Side effects that you should report to your doctor or health care professional as soon as possible:  allergic reactions like skin rash, itching or hives, swelling of the face, lips, or tongue  breathing problems  changes in vision  diarrhea that continues or is severe  lump or swelling on the neck  severe nausea  signs and symptoms of infection like fever or chills; cough;  sore throat; pain or trouble passing urine  signs and symptoms of low blood sugar such as feeling anxious, confusion, dizziness, increased hunger, unusually weak or tired, sweating, shakiness, cold, irritable, headache, blurred vision, fast heartbeat, loss of consciousness  signs and symptoms of kidney injury like trouble passing urine or change in the amount of urine  trouble swallowing  unusual stomach upset or pain  vomiting Side effects that usually do not require medical attention (report to your doctor or health care professional if they continue or are bothersome):  constipation  diarrhea  nausea  pain, redness, or irritation at site where  injected  stomach upset This list may not describe all possible side effects. Call your doctor for medical advice about side effects. You may report side effects to FDA at 1-800-FDA-1088. Where should I keep my medicine? Keep out of the reach of children. Store unopened pens in a refrigerator between 2 and 8 degrees C (36 and 46 degrees F). Do not freeze. Protect from light and heat. After you first use the pen, it can be stored for 56 days at room temperature between 15 and 30 degrees C (59 and 86 degrees F) or in a refrigerator. Throw away your used pen after 56 days or after the expiration date, whichever comes first. Do not store your pen with the needle attached. If the needle is left on, medicine may leak from the pen. NOTE: This sheet is a summary. It may not cover all possible information. If you have questions about this medicine, talk to your doctor, pharmacist, or health care provider.  2020 Elsevier/Gold Standard (2018-12-16 09:41:51)

## 2019-10-26 ENCOUNTER — Other Ambulatory Visit: Payer: Self-pay | Admitting: Internal Medicine

## 2019-11-11 ENCOUNTER — Other Ambulatory Visit: Payer: Self-pay | Admitting: Cardiology

## 2019-11-11 NOTE — Telephone Encounter (Signed)
Eliquis 5mg  refill request received. Patient is 59 years old, weight-112.5kg, Crea- 0.86 on 10/22/2019, Diagnosis-Afib, and last seen by Dr. Curt Bears on 01/01/2019. Dose is appropriate based on dosing criteria. Will send in refill to requested pharmacy.

## 2019-11-20 ENCOUNTER — Other Ambulatory Visit: Payer: Self-pay | Admitting: Internal Medicine

## 2019-12-02 NOTE — Progress Notes (Signed)
Assessment and Plan:  1. Morbid obesity ([place]) - long discussion about weight loss, diet, and exercise - she is making slow but steady progress with weight loss over the last year - weight goal <160, short term goal < 220 lb -recommended diet heavy in fruits and veggies and low in animal meats, cheeses, and dairy products - will defer on Wegovy pending thyroid results per patient preference which is reasonable; after discussion of risks and benefits will try adding wellbutrin 150 mg daily in AM for mood, energy, and modest weight benefits. Follow up in 4-6 weeks.   2. Essential hypertension Atypically elevated today; patient reports remains well controlled at home; defer med changes - continue medications, DASH diet, exercise and monitor at home. Call if greater than 130/80.   Further disposition pending results of labs. Discussed med's effects and SE's.   Over 30 minutes of exam, counseling, chart review, and critical decision making was performed.   Future Appointments  Date Time Provider [place]  12/24/2019  4:00 PM Glendale Chard, MD [facility] None  01/27/2020  4:00 PM Unk Pinto, MD [facility] None  03/15/2020  8:00 AM Constance Haw, MD [facility]  05/04/2020  3:00 PM Liane Comber, NP [facility] None    ------------------------------------------------------------------------------------------------------------------   HPI BP (!) 142/74   Pulse 64   Temp (!) 96.8 F (36 C)   Wt 243 lb (110.2 kg)   LMP 10/29/2013 (Approximate)   SpO2 97%   BMI 39.82 kg/m   59 y.o.female presents for 6 week follow up on morbid obesity after initiation of Wegovy.  She reports read SE, was concerned about possible GI SE "I have diarrhea already" - also due to current monitoring of thyroid nodules, due for follow up imaging in Oct which has been ordered, decided wants to hold off until she has confirmation of these results. Did discuss contraindication is  specific to personal or family hx of medullary thyroid cancer, and that this is a rare type.  BMI is Body mass index is 39.82 kg/m., she has been working on diet and exercise. Didn't tolerate phentermine/topamax well, had sleep problems She is doing water aerobics regularly She is watching portions and watching carbohydrate, eating more veggies, salads, reducing red meat, eating more chicken, salmon, nuts and seeds as snacks Weight is down 5 lb with lifestyle from last visit. She is pleased that she is losing weight, but end goal <160 lb and frustrated with slow progress.  We discussed possible benefit with wellbutrin -  Wt Readings from Last 3 Encounters:  12/03/19 243 lb (110.2 kg)  10/22/19 248 lb (112.5 kg)  07/21/19 250 lb 3.2 oz (113.5 kg)     Past Medical History:  Diagnosis Date  . Arthritis    "probably in my knees; maybe in my hands" (04/11/2017)  . Asthma attack 10/2015 X 1  . Atrial flutter ([place]) 10/2015  . Heart murmur    "comes and goes" (04/11/2017)  . Hepatitis 1972   "don't know what kind"  . High cholesterol   . Hypertension   . Hypothyroidism   . Malignant melanoma of leg ([place])    "right thigh"  . PAF (paroxysmal atrial fibrillation) ([place])   . Pneumonia 1966; 1967   "left lung collapsed one of these times"  . Psoriasis   . Rosacea   . Vitamin D deficiency      Allergies  Allergen Reactions  . Lipitor [Atorvastatin] Other (See Comments)    "neck pain"  . Metoprolol  Other (See Comments)    DIDN'T WORK FOR PATIENT AND GAINED WEIGHT MIGHT HAVE BEEN TAKEN WITH RYTHMOL  . Phentermine Other (See Comments)    Did not help, Insomnia   . Rythmol [Propafenone] Other (See Comments)    "weight gain"  . Sulfa Drugs Cross Reactors Other (See Comments)    "soreness all over"  . Lisinopril Cough    Current Outpatient Medications on File Prior to Visit  Medication Sig  . Ascorbic Acid (VITAMIN C) 500 MG CHEW Chew by mouth. 2 times per day  . augmented  betamethasone dipropionate (DIPROLENE-AF) 0.05 % cream APPLY VERY SPARINGLY TO PSORIASIS RASH (DO NOT USE ON FACE ! )  . Calcium 600-400 MG-UNIT CHEW Chew 1 each by mouth 2 (two) times daily.   . Cholecalciferol (VITAMIN D PO) Take 10,000 Units by mouth daily.   . Coenzyme Q10 (CO Q10) 100 MG CAPS Take 100 mg by mouth daily.   . cyclobenzaprine (FLEXERIL) 5 MG tablet Take 1 tablet (5 mg total) by mouth 3 (three) times daily as needed for muscle spasms.  Marland Kitchen diltiazem (CARDIZEM CD) 120 MG 24 hr capsule Take 1 capsule (120 mg total) by mouth daily.  Marland Kitchen ELIQUIS 5 MG TABS tablet TAKE 1 TABLET BY MOUTH TWICE A DAY  . ezetimibe (ZETIA) 10 MG tablet TAKE 1 TABLET DAILY FOR CHOLESTREROL  . flecainide (TAMBOCOR) 100 MG tablet TAKE 1 TABLET BY MOUTH TWICE A DAY  . fluticasone (FLONASE) 50 MCG/ACT nasal spray Place 1-2 sprays into both nostrils at bedtime.  . furosemide (LASIX) 20 MG tablet Take 1 tablet Daily for Fluid Retention (Patient taking differently: as needed. Take 1 tablet Daily for Fluid Retention)  . levothyroxine (SYNTHROID) 50 MCG tablet TAKE 1 TABLET DAILY ON EMPTY STOMACH WITH ONLY WATER FOR 30 MIN (NO ANTACID MEDS, CALCIUM OR MAGNESIUM FOR 4 HOURS & AVOID BIOTIN)  . loratadine (CLARITIN) 10 MG tablet TAKE 10 MG BY MOUTH DAILY AS NEEDED FOR ALLERGY RELIEF  . Magnesium 500 MG TABS Take 500 mg by mouth every evening.   . montelukast (SINGULAIR) 10 MG tablet TAKE 1 TABLET BY MOUTH DAILY FOR ALLERGIES  . Multiple Vitamin (MULTIVITAMIN WITH MINERALS) TABS tablet Take 1 tablet by mouth daily.  . NONFORMULARY OR COMPOUNDED ITEM Place 1 each vaginally See admin instructions. Estriol/testosterone 0.25-0.25 vaginal suppository compound. Insert vaginally on Sunday, Monday and Wednesday  . NONFORMULARY OR COMPOUNDED ITEM Apply 1 each topically See admin instructions. Progesterone 8%/20ml Apply 0.49mls topically every day except Saturday.  Marland Kitchen OVER THE COUNTER MEDICATION in the morning and at bedtime. Taking  Ferrous Sulfate 325 mg daily.  Vladimir Faster Glycol-Propyl Glycol (SYSTANE OP) Place 1 drop into both eyes daily.  . Turmeric 500 MG CAPS Take 500 mg by mouth 2 (two) times daily.  . Probiotic Product (PROBIOTIC DAILY PO) Take 1 tablet by mouth daily. (Patient not taking: Reported on 10/22/2019)   No current facility-administered medications on file prior to visit.    ROS: all negative except above.   Physical Exam:  BP (!) 142/74   Pulse 64   Temp (!) 96.8 F (36 C)   Wt 243 lb (110.2 kg)   LMP 10/29/2013 (Approximate)   SpO2 97%   BMI 39.82 kg/m   General Appearance: Well nourished, morbidly obese female in no apparent distress. Eyes: PERRLA, EOMs, conjunctiva no swelling or erythema Sinuses: No Frontal/maxillary tenderness ENT/Mouth: Ext aud canals clear, TMs without erythema, bulging. No erythema, swelling, or exudate on post pharynx.  Tonsils not swollen or erythematous. Hearing normal.  Neck: Supple, thyroid normal.  Respiratory: Respiratory effort normal, BS equal bilaterally without rales, rhonchi, wheezing or stridor.  Cardio: RRR with no MRGs. Brisk peripheral pulses without edema.  Abdomen: Soft, obese abdomen, + BS.  Non tender, no guarding, rebound, hernias, masses. Lymphatics: Non tender without lymphadenopathy.  Musculoskeletal: Full ROM, 5/5 strength, normal gait.  Skin: Warm, dry without rashes, lesions, ecchymosis.  Neuro: Cranial nerves intact. Normal muscle tone, no cerebellar symptoms. Sensation intact.  Psych: Awake and oriented X 3, normal affect, Insight and Judgment appropriate.     Vicie Mutters, PA-C 4:23 PM [facility]

## 2019-12-03 ENCOUNTER — Other Ambulatory Visit: Payer: Self-pay

## 2019-12-03 ENCOUNTER — Encounter: Payer: Self-pay | Admitting: Adult Health

## 2019-12-03 ENCOUNTER — Ambulatory Visit: Payer: BC Managed Care – PPO | Admitting: Adult Health

## 2019-12-03 DIAGNOSIS — I10 Essential (primary) hypertension: Secondary | ICD-10-CM

## 2019-12-03 MED ORDER — BUPROPION HCL ER (XL) 150 MG PO TB24
150.0000 mg | ORAL_TABLET | ORAL | 1 refills | Status: DC
Start: 2019-12-03 — End: 2019-12-26

## 2019-12-03 NOTE — Patient Instructions (Addendum)
Bupropion tablets (Depression/Mood Disorders) What is this medicine? BUPROPION (byoo PROE pee on) is used to treat depression. This medicine may be used for other purposes; ask your health care provider or pharmacist if you have questions. COMMON BRAND NAME(S): Wellbutrin What should I tell my health care provider before I take this medicine? They need to know if you have any of these conditions:  an eating disorder, such as anorexia or bulimia  bipolar disorder or psychosis  diabetes or high blood sugar, treated with medication  glaucoma  heart disease, previous heart attack, or irregular heart beat  head injury or brain tumor  high blood pressure  kidney or liver disease  seizures  suicidal thoughts or a previous suicide attempt  Tourette's syndrome  weight loss  an unusual or allergic reaction to bupropion, other medicines, foods, dyes, or preservatives  breast-feeding  pregnant or trying to become pregnant How should I use this medicine? Take this medicine by mouth with a glass of water. Follow the directions on the prescription label. You can take it with or without food. If it upsets your stomach, take it with food. Take your medicine at regular intervals. Do not take your medicine more often than directed. Do not stop taking this medicine suddenly except upon the advice of your doctor. Stopping this medicine too quickly may cause serious side effects or your condition may worsen. A special MedGuide will be given to you by the pharmacist with each prescription and refill. Be sure to read this information carefully each time. Talk to your pediatrician regarding the use of this medicine in children. Special care may be needed. Overdosage: If you think you have taken too much of this medicine contact a poison control center or emergency room at once. NOTE: This medicine is only for you. Do not share this medicine with others. What if I miss a dose? If you miss a dose,  take it as soon as you can. If it is less than four hours to your next dose, take only that dose and skip the missed dose. Do not take double or extra doses. What may interact with this medicine? Do not take this medicine with any of the following medications:  linezolid  MAOIs like Azilect, Carbex, Eldepryl, Marplan, Nardil, and Parnate  methylene blue (injected into a vein)  other medicines that contain bupropion like Zyban This medicine may also interact with the following medications:  alcohol  certain medicines for anxiety or sleep  certain medicines for blood pressure like metoprolol, propranolol  certain medicines for depression or psychotic disturbances  certain medicines for HIV or AIDS like efavirenz, lopinavir, nelfinavir, ritonavir  certain medicines for irregular heart beat like propafenone, flecainide  certain medicines for Parkinson's disease like amantadine, levodopa  certain medicines for seizures like carbamazepine, phenytoin, phenobarbital  cimetidine  clopidogrel  cyclophosphamide  digoxin  furazolidone  isoniazid  nicotine  orphenadrine  procarbazine  steroid medicines like prednisone or cortisone  stimulant medicines for attention disorders, weight loss, or to stay awake  tamoxifen  theophylline  thiotepa  ticlopidine  tramadol  warfarin This list may not describe all possible interactions. Give your health care provider a list of all the medicines, herbs, non-prescription drugs, or dietary supplements you use. Also tell them if you smoke, drink alcohol, or use illegal drugs. Some items may interact with your medicine. What should I watch for while using this medicine? Tell your doctor if your symptoms do not get better or if they get worse.   Visit your doctor or healthcare provider for regular checks on your progress. Because it may take several weeks to see the full effects of this medicine, it is important to continue your  treatment as prescribed by your doctor. This medicine may cause serious skin reactions. They can happen weeks to months after starting the medicine. Contact your healthcare provider right away if you notice fevers or flu-like symptoms with a rash. The rash may be red or purple and then turn into blisters or peeling of the skin. Or, you might notice a red rash with swelling of the face, lips or lymph nodes in your neck or under your arms. Patients and their families should watch out for new or worsening thoughts of suicide or depression. Also watch out for sudden changes in feelings such as feeling anxious, agitated, panicky, irritable, hostile, aggressive, impulsive, severely restless, overly excited and hyperactive, or not being able to sleep. If this happens, especially at the beginning of treatment or after a change in dose, call your healthcare provider. Avoid alcoholic drinks while taking this medicine. Drinking excessive alcoholic beverages, using sleeping or anxiety medicines, or quickly stopping the use of these agents while taking this medicine may increase your risk for a seizure. Do not drive or use heavy machinery until you know how this medicine affects you. This medicine can impair your ability to perform these tasks. Do not take this medicine close to bedtime. It may prevent you from sleeping. Your mouth may get dry. Chewing sugarless gum or sucking hard candy, and drinking plenty of water may help. Contact your doctor if the problem does not go away or is severe. What side effects may I notice from receiving this medicine? Side effects that you should report to your doctor or health care professional as soon as possible:  allergic reactions like skin rash, itching or hives, swelling of the face, lips, or tongue  breathing problems  changes in vision  confusion  elevated mood, decreased need for sleep, racing thoughts, impulsive behavior  fast or irregular  heartbeat  hallucinations, loss of contact with reality  increased blood pressure  rash, fever, and swollen lymph nodes  redness, blistering, peeling, or loosening of the skin, including inside the mouth  seizures  suicidal thoughts or other mood changes  unusually weak or tired  vomiting Side effects that usually do not require medical attention (report to your doctor or health care professional if they continue or are bothersome):  constipation  headache  loss of appetite  nausea  tremors  weight loss This list may not describe all possible side effects. Call your doctor for medical advice about side effects. You may report side effects to FDA at 1-800-FDA-1088. Where should I keep my medicine? Keep out of the reach of children. Store at room temperature between 20 and 25 degrees C (68 and 77 degrees F), away from direct sunlight and moisture. Keep tightly closed. Throw away any unused medicine after the expiration date. NOTE: This sheet is a summary. It may not cover all possible information. If you have questions about this medicine, talk to your doctor, pharmacist, or health care provider.  2020 Elsevier/Gold Standard (2018-06-26 14:02:47)  

## 2019-12-18 ENCOUNTER — Encounter: Payer: Self-pay | Admitting: Internal Medicine

## 2019-12-24 ENCOUNTER — Encounter: Payer: Self-pay | Admitting: Internal Medicine

## 2019-12-24 ENCOUNTER — Other Ambulatory Visit: Payer: Self-pay

## 2019-12-24 ENCOUNTER — Ambulatory Visit: Payer: BC Managed Care – PPO | Admitting: Internal Medicine

## 2019-12-24 VITALS — BP 140/78 | HR 58 | Temp 98.0°F | Ht 65.5 in | Wt 240.0 lb

## 2019-12-24 DIAGNOSIS — E66812 Obesity, class 2: Secondary | ICD-10-CM

## 2019-12-24 DIAGNOSIS — N950 Postmenopausal bleeding: Secondary | ICD-10-CM | POA: Diagnosis not present

## 2019-12-24 DIAGNOSIS — I10 Essential (primary) hypertension: Secondary | ICD-10-CM | POA: Diagnosis not present

## 2019-12-24 DIAGNOSIS — Z6839 Body mass index (BMI) 39.0-39.9, adult: Secondary | ICD-10-CM

## 2019-12-24 NOTE — Progress Notes (Signed)
This visit occurred during the SARS-CoV-2 public health emergency.  Safety protocols were in place, including screening questions prior to the visit, additional usage of staff PPE, and extensive cleaning of exam room while observing appropriate contact time as indicated for disinfecting solutions.  Subjective:     Patient ID: Monica Morgan , female    DOB: Jul 19, 1960 , 59 y.o.   MRN: [id number]   Chief Complaint  Patient presents with  . Other    BHRT F/u    HPI  She presents today for f/u BHRT. She has been using estriol vaginally three days per week and progesterone cream nightly except Saturdays. She has had two episodes of postmenopausal bleeding. First event occurred a couple of months ago. She had repeat episode within past two weeks. There was also some associated abdominal cramping as if she were on her regular menses.     Past Medical History:  Diagnosis Date  . Arthritis    "probably in my knees; maybe in my hands" (04/11/2017)  . Asthma attack 10/2015 X 1  . Atrial flutter ([place]) 10/2015  . Heart murmur    "comes and goes" (04/11/2017)  . Hepatitis 1972   "don't know what kind"  . High cholesterol   . Hypertension   . Hypothyroidism   . Malignant melanoma of leg ([place])    "right thigh"  . PAF (paroxysmal atrial fibrillation) ([place])   . Pneumonia 1966; 1967   "left lung collapsed one of these times"  . Psoriasis   . Rosacea   . Vitamin D deficiency      Family History  Problem Relation Age of Onset  . Heart disease Mother   . Hypertension Mother   . Heart attack Mother   . Heart disease Father   . Diabetes Father   . Hypertension Father   . Heart attack Brother   . Colon cancer Neg Hx   . Stroke Neg Hx      Current Outpatient Medications:  .  Calcium 600-400 MG-UNIT CHEW, Chew 1 each by mouth 2 (two) times daily. , Disp: , Rfl:  .  Cholecalciferol (VITAMIN D PO), Take 10,000 Units by mouth daily. , Disp: , Rfl:  .  Coenzyme Q10 (CO Q10) 100 MG CAPS,  Take 100 mg by mouth daily. , Disp: , Rfl:  .  diltiazem (CARDIZEM CD) 120 MG 24 hr capsule, Take 1 capsule (120 mg total) by mouth daily., Disp: 90 capsule, Rfl: 3 .  ELIQUIS 5 MG TABS tablet, TAKE 1 TABLET BY MOUTH TWICE A DAY, Disp: 60 tablet, Rfl: 5 .  ezetimibe (ZETIA) 10 MG tablet, TAKE 1 TABLET DAILY FOR CHOLESTREROL, Disp: 90 tablet, Rfl: 3 .  flecainide (TAMBOCOR) 100 MG tablet, TAKE 1 TABLET BY MOUTH TWICE A DAY, Disp: 180 tablet, Rfl: 2 .  fluticasone (FLONASE) 50 MCG/ACT nasal spray, Place 1-2 sprays into both nostrils at bedtime., Disp: 16 g, Rfl: 1 .  Magnesium 500 MG TABS, Take 500 mg by mouth every evening. , Disp: , Rfl:  .  montelukast (SINGULAIR) 10 MG tablet, TAKE 1 TABLET BY MOUTH DAILY FOR ALLERGIES, Disp: 90 tablet, Rfl: 0 .  Multiple Vitamin (MULTIVITAMIN WITH MINERALS) TABS tablet, Take 1 tablet by mouth daily., Disp: , Rfl:  .  NONFORMULARY OR COMPOUNDED ITEM, Apply 1 each topically See admin instructions. Progesterone 8%/31ml Apply 0.75mls topically every day except Saturday., Disp: , Rfl:  .  OVER THE COUNTER MEDICATION, in the morning and at bedtime. Taking Ferrous Sulfate  325 mg daily., Disp: , Rfl:  .  Polyethyl Glycol-Propyl Glycol (SYSTANE OP), Place 1 drop into both eyes daily., Disp: , Rfl:  .  Probiotic Product (PROBIOTIC DAILY PO), Take 1 tablet by mouth daily. , Disp: , Rfl:  .  Turmeric 500 MG CAPS, Take 500 mg by mouth 2 (two) times daily., Disp: , Rfl:  .  Ascorbic Acid (VITAMIN C) 500 MG CHEW, Chew by mouth. 2 times per day (Patient not taking: Reported on 12/24/2019), Disp: , Rfl:  .  augmented betamethasone dipropionate (DIPROLENE-AF) 0.05 % cream, APPLY VERY SPARINGLY TO PSORIASIS RASH (DO NOT USE ON FACE ! ) (Patient not taking: Reported on 12/24/2019), Disp: 50 g, Rfl: 5 .  buPROPion (WELLBUTRIN XL) 150 MG 24 hr tablet, Take 1 tablet Daily for Mood, Focus & Concentration, Disp: 90 tablet, Rfl: 0 .  cyclobenzaprine (FLEXERIL) 5 MG tablet, Take 1 tablet (5 mg  total) by mouth 3 (three) times daily as needed for muscle spasms. (Patient not taking: Reported on 12/24/2019), Disp: 60 tablet, Rfl: 0 .  furosemide (LASIX) 20 MG tablet, Take 1 tablet Daily for Fluid Retention (Patient not taking: Reported on 12/24/2019), Disp: 90 tablet, Rfl: 1 .  levothyroxine (SYNTHROID) 50 MCG tablet, TAKE 1 TABLET DAILY ON EMPTY STOMACH WITH ONLY WATER FOR 30 MIN (NO ANTACID MEDS, CALCIUM OR MAGNESIUM FOR 4 HOURS & AVOID BIOTIN), Disp: 90 tablet, Rfl: 3 .  loratadine (CLARITIN) 10 MG tablet, TAKE 10 MG BY MOUTH DAILY AS NEEDED FOR ALLERGY RELIEF (Patient not taking: Reported on 12/24/2019), Disp: , Rfl: 0 .  NONFORMULARY OR COMPOUNDED ITEM, Place 1 each vaginally See admin instructions. Estriol/testosterone 0.25-0.25 vaginal suppository compound. Insert vaginally on Sunday, Monday and Wednesday (Patient not taking: Reported on 12/24/2019), Disp: , Rfl:    Allergies  Allergen Reactions  . Lipitor [Atorvastatin] Other (See Comments)    "neck pain"  . Metoprolol Other (See Comments)    DIDN'T WORK FOR PATIENT AND GAINED WEIGHT MIGHT HAVE BEEN TAKEN WITH RYTHMOL  . Phentermine Other (See Comments)    Did not help, Insomnia   . Rythmol [Propafenone] Other (See Comments)    "weight gain"  . Sulfa Drugs Cross Reactors Other (See Comments)    "soreness all over"  . Lisinopril Cough     Review of Systems  Constitutional: Negative.   Respiratory: Negative.   Cardiovascular: Negative.   Gastrointestinal: Negative.   Neurological: Negative.   Psychiatric/Behavioral: Negative.      Today's Vitals   12/24/19 1624  BP: 140/78  Pulse: (!) 58  Temp: 98 F (36.7 C)  TempSrc: Oral  Weight: 240 lb (108.9 kg)  Height: 5' 5.5" (1.664 m)  PainSc: 0-No pain   Body mass index is 39.33 kg/m.   Objective:  Physical Exam Vitals and nursing note reviewed.  Constitutional:      Appearance: Normal appearance.  HENT:     Head: Normocephalic and atraumatic.  Cardiovascular:      Rate and Rhythm: Normal rate and regular rhythm.     Heart sounds: Normal heart sounds.  Pulmonary:     Effort: Pulmonary effort is normal.     Breath sounds: Normal breath sounds.  Skin:    General: Skin is warm.  Neurological:     General: No focal deficit present.     Mental Status: She is alert.  Psychiatric:        Mood and Affect: Mood normal.        Behavior: Behavior normal.  Assessment And Plan:     1. Postmenopausal bleeding Comments: Pt advised of need for GYN evaluation which will likely include pelvic u/s and endometrial biopsy. ADvised earlier in week to stop estriol supplementation. Previous labs reviewed and thyroid function normal in Dec 2020.  She is in agreement with treatment plan. Referral placed. I will check serum estradiol and progesterone levels today.  - Ambulatory referral to Gynecology - Progesterone  2. Essential hypertension, benign Comments: Chronic, fair control. She will continue with current meds as per PCP. Encouraged to follow a low sodium diet.   3. Class 2 severe obesity due to excess calories with serious comorbidity and body mass index (BMI) of 39.0 to 39.9 in adult [facility]) Comments: Encouraged to strive for BMI less than 30 to decrease cardiac risk.      Patient was given opportunity to ask questions. Patient verbalized understanding of the plan and was able to repeat key elements of the plan. All questions were answered to their satisfaction.  Maximino Greenland, MD   I, Maximino Greenland, MD, have reviewed all documentation for this visit. The documentation on 12/30/19 for the exam, diagnosis, procedures, and orders are all accurate and complete.  THE PATIENT IS ENCOURAGED TO PRACTICE SOCIAL DISTANCING DUE TO THE COVID-19 PANDEMIC.

## 2019-12-25 ENCOUNTER — Other Ambulatory Visit: Payer: Self-pay | Admitting: Internal Medicine

## 2019-12-25 DIAGNOSIS — Z1231 Encounter for screening mammogram for malignant neoplasm of breast: Secondary | ICD-10-CM

## 2019-12-25 LAB — PROGESTERONE: Progesterone: 0.4 ng/mL

## 2019-12-25 LAB — ESTRADIOL: Estradiol: 13.8 pg/mL

## 2019-12-26 ENCOUNTER — Other Ambulatory Visit: Payer: Self-pay | Admitting: Physician Assistant

## 2020-01-05 DIAGNOSIS — N950 Postmenopausal bleeding: Secondary | ICD-10-CM | POA: Diagnosis not present

## 2020-01-08 DIAGNOSIS — R9389 Abnormal findings on diagnostic imaging of other specified body structures: Secondary | ICD-10-CM | POA: Diagnosis not present

## 2020-01-08 DIAGNOSIS — N950 Postmenopausal bleeding: Secondary | ICD-10-CM | POA: Diagnosis not present

## 2020-01-14 DIAGNOSIS — N950 Postmenopausal bleeding: Secondary | ICD-10-CM | POA: Diagnosis not present

## 2020-01-21 ENCOUNTER — Ambulatory Visit
Admission: RE | Admit: 2020-01-21 | Discharge: 2020-01-21 | Disposition: A | Discharge status: Home / Self Care | Payer: BC Managed Care – PPO | Source: Ambulatory Visit | Attending: Adult Health | Admitting: Adult Health

## 2020-01-21 DIAGNOSIS — E041 Nontoxic single thyroid nodule: Secondary | ICD-10-CM | POA: Diagnosis not present

## 2020-01-21 DIAGNOSIS — E042 Nontoxic multinodular goiter: Secondary | ICD-10-CM

## 2020-01-22 ENCOUNTER — Other Ambulatory Visit: Payer: Self-pay | Admitting: Adult Health

## 2020-01-22 ENCOUNTER — Telehealth: Payer: Self-pay | Admitting: *Deleted

## 2020-01-22 DIAGNOSIS — E042 Nontoxic multinodular goiter: Secondary | ICD-10-CM

## 2020-01-22 NOTE — Telephone Encounter (Signed)
   [facility] Pre-operative Risk Assessment    HEARTCARE STAFF: - Please ensure there is not already an duplicate clearance open for this procedure. - Under Visit Info/Reason for Call, type in Other and utilize the format Clearance MM/DD/YY or Clearance TBD. Do not use dashes or single digits. - If request is for dental extraction, please clarify the # of teeth to be extracted.  Request for surgical clearance:  1. What type of surgery is being performed? D & C HYSTEROSCOPY   2. When is this surgery scheduled? 03/28/20   3. What type of clearance is required (medical clearance vs. Pharmacy clearance to hold med vs. Both)? BOTH  4. Are there any medications that need to be held prior to surgery and how long? ELIQUIS; ASKING FOR INSTRUCTIONS ON WHEN TO STOP AND WHEN TO RESUME ELIQUIS POST-OP   5. Practice name and name of physician performing surgery? [facility]; DR. Vito Backers LAW   6. What is the office phone number? [phone]   7.   What is the office fax number? [phone] ATTN: SHANELLE  8.   Anesthesia type (None, local, MAC, general) ? TOTAL IV SEDATION WITH [facility]    Julaine Hua 01/22/2020, 4:04 PM  _________________________________________________________________   (provider comments below)

## 2020-01-22 NOTE — Telephone Encounter (Signed)
Patient with diagnosis of A Fib on Eliquis for anticoagulation.    Of note, patient has not been seen at cardiology since 01/01/2019  Procedure: D & C HYSTEROSCOPY  Date of procedure: 03/28/20  CHADS2-VASc score of  2 (HTN, female)  CrCl 85 mL/min using adjusted body weight Platelet count 326K  Per office protocol, patient can hold Eliquis for 2-3 days prior to procedure.    Patient should restart Eliquis on the evening of procedure or day after, at discretion of procedure MD

## 2020-01-25 NOTE — Telephone Encounter (Signed)
Primary [place], MD  Chart reviewed as part of pre-operative protocol coverage. Because of Monica Morgan's past medical history and time since last visit, he/she will require a follow-up visit in order to better assess preoperative cardiovascular risk.  Pre-op covering staff: - Please schedule appointment and call patient to inform them. - Please contact requesting surgeon's office via preferred method (i.e, phone, fax) to inform them of need for appointment prior to surgery.  If applicable, this message will also be routed to pharmacy pool and/or primary cardiologist for input on holding anticoagulant/antiplatelet agent as requested below so that this information is available at time of patient's appointment.   Deberah Pelton, NP  01/25/2020, 8:23 AM

## 2020-01-25 NOTE — Telephone Encounter (Signed)
LM2CB-needs appt [facility]

## 2020-01-26 DIAGNOSIS — M9902 Segmental and somatic dysfunction of thoracic region: Secondary | ICD-10-CM | POA: Diagnosis not present

## 2020-01-26 DIAGNOSIS — M546 Pain in thoracic spine: Secondary | ICD-10-CM | POA: Diagnosis not present

## 2020-01-26 DIAGNOSIS — M6282 Rhabdomyolysis: Secondary | ICD-10-CM | POA: Diagnosis not present

## 2020-01-26 DIAGNOSIS — M9901 Segmental and somatic dysfunction of cervical region: Secondary | ICD-10-CM | POA: Diagnosis not present

## 2020-01-26 NOTE — Telephone Encounter (Signed)
Pt has appt 02/12/20 with Joesph July, PAC. Will remove from pre op call back pool. Will forward notes to [facility] for upcoming appt. Will send FYI to requesting office pt has appt 02/12/20 with cardiologist.

## 2020-01-27 ENCOUNTER — Other Ambulatory Visit: Payer: Self-pay

## 2020-01-27 ENCOUNTER — Encounter: Payer: Self-pay | Admitting: Internal Medicine

## 2020-01-27 ENCOUNTER — Ambulatory Visit: Payer: BC Managed Care – PPO | Admitting: Internal Medicine

## 2020-01-27 VITALS — BP 124/82 | HR 56 | Temp 97.4°F | Resp 16 | Ht 65.5 in | Wt 235.6 lb

## 2020-01-27 DIAGNOSIS — M6282 Rhabdomyolysis: Secondary | ICD-10-CM | POA: Diagnosis not present

## 2020-01-27 DIAGNOSIS — M9902 Segmental and somatic dysfunction of thoracic region: Secondary | ICD-10-CM | POA: Diagnosis not present

## 2020-01-27 DIAGNOSIS — E782 Mixed hyperlipidemia: Secondary | ICD-10-CM | POA: Diagnosis not present

## 2020-01-27 DIAGNOSIS — E039 Hypothyroidism, unspecified: Secondary | ICD-10-CM | POA: Diagnosis not present

## 2020-01-27 DIAGNOSIS — I480 Paroxysmal atrial fibrillation: Secondary | ICD-10-CM

## 2020-01-27 DIAGNOSIS — R7309 Other abnormal glucose: Secondary | ICD-10-CM | POA: Diagnosis not present

## 2020-01-27 DIAGNOSIS — E559 Vitamin D deficiency, unspecified: Secondary | ICD-10-CM | POA: Diagnosis not present

## 2020-01-27 DIAGNOSIS — Z6841 Body Mass Index (BMI) 40.0 and over, adult: Secondary | ICD-10-CM

## 2020-01-27 DIAGNOSIS — I10 Essential (primary) hypertension: Secondary | ICD-10-CM

## 2020-01-27 DIAGNOSIS — Z79899 Other long term (current) drug therapy: Secondary | ICD-10-CM | POA: Diagnosis not present

## 2020-01-27 DIAGNOSIS — M546 Pain in thoracic spine: Secondary | ICD-10-CM | POA: Diagnosis not present

## 2020-01-27 DIAGNOSIS — M9901 Segmental and somatic dysfunction of cervical region: Secondary | ICD-10-CM | POA: Diagnosis not present

## 2020-01-27 NOTE — Patient Instructions (Signed)

## 2020-01-27 NOTE — Progress Notes (Signed)
History of Present Illness:       This very nice 59 y.o.  DWF presents for 3 month follow up with HTN, HLD, Pre-Diabetes and Vitamin D Deficiency.       Patient is treated for HTN circa 2013  & BP has been controlled at home. Today's BP is at goal - 124/82.  She's had RFA ablations x 2 in 2017/2018 by Dr Curt Bears for pAfib /Flutter. She in on Eliquis & Diltiazem for CHADsVASc2.  Patient has had no complaints of any cardiac type chest pain, palpitations, dyspnea / orthopnea / PND, dizziness, claudication, or dependent edema.      Hyperlipidemia is controlled with diet & zetia. Patient denies myalgias or other med SE's. Last Lipids were at goal except elevated Trig's:  Lab Results  Component Value Date   CHOL 172 10/22/2019   HDL 45 (L) 10/22/2019   LDLCALC 95 10/22/2019   TRIG 228 (H) 10/22/2019   CHOLHDL 3.8 10/22/2019    Also, the patient has  Morbid Obesity (BMI 38.6+) and has lost 10# over the last 6 months.  She is monitored expectantly for glucose intolerance and in Dec 2020, She had a A1c 5.7% and elevated Insulin 101.9.   She has had no symptoms of reactive hypoglycemia, diabetic polys, paresthesias or visual blurring.  Last A1c was still not at goal:  Lab Results  Component Value Date   HGBA1C 5.8 (H) 07/21/2019                                                      Patient was dx'd Hypothyroid  in 2014  &has been on thyroid replacement since.  In Sept 2020, Thyroid U/S found  a small Rt inf  Thyroid nodule & she had f/u thyroid U/S on 01/21/2020 and is scheduled for skinny needle bx  next week.        Further, the patient also has history of Vitamin D Deficiency and supplements vitamin D without any suspected side-effects. Last vitamin D was at goal:   Lab Results  Component Value Date   VD25OH 100 07/21/2019    Current Outpatient Medications on File Prior to Visit  Medication Sig  . Ascorbic Acid (VITAMIN C) 500 MG CHEW Chew by mouth. 2 times per day   .  augmented betamethasone dipropionate (DIPROLENE-AF) 0.05 % cream APPLY VERY SPARINGLY TO PSORIASIS RASH (DO NOT USE ON FACE ! )  . buPROPion (WELLBUTRIN XL) 150 MG 24 hr tablet Take 1 tablet Daily for Mood, Focus & Concentration  . Calcium 600-400 MG-UNIT CHEW Chew 1 each by mouth 2 (two) times daily.   . Cholecalciferol (VITAMIN D PO) Take 10,000 Units by mouth daily.   . Coenzyme Q10 (CO Q10) 100 MG CAPS Take 100 mg by mouth daily.   . cyclobenzaprine (FLEXERIL) 5 MG tablet Take 1 tablet (5 mg total) by mouth 3 (three) times daily as needed for muscle spasms.  Marland Kitchen diltiazem (CARDIZEM CD) 120 MG 24 hr capsule Take 1 capsule (120 mg total) by mouth daily.  Marland Kitchen ELIQUIS 5 MG TABS tablet TAKE 1 TABLET BY MOUTH TWICE A DAY  . ezetimibe (ZETIA) 10 MG tablet TAKE 1 TABLET DAILY FOR CHOLESTREROL  . flecainide (TAMBOCOR) 100 MG tablet TAKE 1 TABLET BY MOUTH TWICE A DAY  .  fluticasone (FLONASE) 50 MCG/ACT nasal spray Place 1-2 sprays into both nostrils at bedtime.  . furosemide (LASIX) 20 MG tablet Take 1 tablet Daily for Fluid Retention  . levothyroxine (SYNTHROID) 50 MCG tablet TAKE 1 TABLET DAILY ON EMPTY STOMACH WITH ONLY WATER FOR 30 MIN (NO ANTACID MEDS, CALCIUM OR MAGNESIUM FOR 4 HOURS & AVOID BIOTIN)  . loratadine (CLARITIN) 10 MG tablet TAKE 10 MG BY MOUTH DAILY AS NEEDED FOR ALLERGY RELIEF  . Magnesium 500 MG TABS Take 500 mg by mouth every evening.   . montelukast (SINGULAIR) 10 MG tablet TAKE 1 TABLET BY MOUTH DAILY FOR ALLERGIES  . Multiple Vitamin (MULTIVITAMIN WITH MINERALS) TABS tablet Take 1 tablet by mouth daily.  . NONFORMULARY OR COMPOUNDED ITEM Place 1 each vaginally See admin instructions. Estriol/testosterone 0.25-0.25 vaginal suppository compound. Insert vaginally on Sunday, Monday and Wednesday  . NONFORMULARY OR COMPOUNDED ITEM Apply 1 each topically See admin instructions. Progesterone 8%/63ml Apply 0.83mls topically every day except Saturday.  Marland Kitchen OVER THE COUNTER MEDICATION in the  morning and at bedtime. Taking Ferrous Sulfate 325 mg daily.  Vladimir Faster Glycol-Propyl Glycol (SYSTANE OP) Place 1 drop into both eyes daily.  . Probiotic Product (PROBIOTIC DAILY PO) Take 1 tablet by mouth daily.   . Turmeric 500 MG CAPS Take 500 mg by mouth 2 (two) times daily.   No current facility-administered medications on file prior to visit.    Allergies  Allergen Reactions  . Lipitor [Atorvastatin] Other (See Comments)    "neck pain"  . Metoprolol Other (See Comments)    DIDN'T WORK FOR PATIENT AND GAINED WEIGHT MIGHT HAVE BEEN TAKEN WITH RYTHMOL  . Phentermine Other (See Comments)    Did not help, Insomnia   . Rythmol [Propafenone] Other (See Comments)    "weight gain"  . Sulfa Drugs Cross Reactors Other (See Comments)    "soreness all over"  . Lisinopril Cough    PMHx:   Past Medical History:  Diagnosis Date  . Arthritis    "probably in my knees; maybe in my hands" (04/11/2017)  . Asthma attack 10/2015 X 1  . Atrial flutter ([place]) 10/2015  . Heart murmur    "comes and goes" (04/11/2017)  . Hepatitis 1972   "don't know what kind"  . High cholesterol   . Hypertension   . Hypothyroidism   . Malignant melanoma of leg ([place])    "right thigh"  . PAF (paroxysmal atrial fibrillation) ([place])   . Pneumonia 1966; 1967   "left lung collapsed one of these times"  . Psoriasis   . Rosacea   . Vitamin D deficiency     Immunization History  Administered Date(s) Administered  . PFIZER SARS-COV-2 Vaccination 06/29/2019, 07/20/2019  . PPD Test 12/07/2013, 12/22/2014, 08/17/2016, 11/27/2017, 04/14/2019  . Tdap 06/23/2015    Past Surgical History:  Procedure Laterality Date  . A-FLUTTER ABLATION N/A 02/08/2017   Procedure: A-Flutter Ablation;  Surgeon: Constance Haw, MD;  Location: [facility];  Service: Cardiovascular;  Laterality: N/A;  . ATRIAL FIBRILLATION ABLATION N/A 04/11/2017   Procedure: ATRIAL FIBRILLATION ABLATION;  Surgeon: Constance Haw,  MD;  Location: [facility];  Service: Cardiovascular;  Laterality: N/A;  . CARDIOVERSION N/A 10/25/2015   Procedure: CARDIOVERSION;  Surgeon: Sueanne Margarita, MD;  Location: [facility];  Service: Cardiovascular;  Laterality: N/A;  . [place]  . CESAREAN SECTION WITH BILATERAL TUBAL LIGATION  1982  . ELECTROPHYSIOLOGIC STUDY N/A 01/13/2016   Procedure:  Atrial Fibrillation Ablation;  Surgeon: Will Meredith Leeds, MD;  Location: [facility] [facility] [facility];  Service: Cardiovascular;  Laterality: N/A;  . ENDOMETRIAL ABLATION  ~ 2012  . MELANOMA EXCISION Right 10/1975   outer thigh "malignant"  . REFRACTIVE SURGERY Bilateral 1990s  . TEE WITHOUT CARDIOVERSION N/A 10/25/2015   Procedure: TRANSESOPHAGEAL ECHOCARDIOGRAM (TEE);  Surgeon: Sueanne Margarita, MD;  Location: [facility];  Service: Cardiovascular;  Laterality: N/A;  . TEE WITHOUT CARDIOVERSION N/A 01/11/2016   Procedure: TRANSESOPHAGEAL ECHOCARDIOGRAM (TEE);  Surgeon: Josue Hector, MD;  Location: [facility];  Service: Cardiovascular;  Laterality: N/A;  . TONSILLECTOMY  1967  . TUBAL LIGATION      FHx:    Reviewed / unchanged  SHx:    Reviewed / unchanged   Systems Review:  Constitutional: Denies fever, chills, wt changes, headaches, insomnia, fatigue, night sweats, change in appetite. Eyes: Denies redness, blurred vision, diplopia, discharge, itchy, watery eyes.  ENT: Denies discharge, congestion, post nasal drip, epistaxis, sore throat, earache, hearing loss, dental pain, tinnitus, vertigo, sinus pain, snoring.  CV: Denies chest pain, palpitations, irregular heartbeat, syncope, dyspnea, diaphoresis, orthopnea, PND, claudication or edema. Respiratory: denies cough, dyspnea, DOE, pleurisy, hoarseness, laryngitis, wheezing.  Gastrointestinal: Denies dysphagia, odynophagia, heartburn, reflux, water brash, abdominal pain or cramps, nausea, vomiting, bloating, diarrhea, constipation, hematemesis, melena, hematochezia  or  hemorrhoids. Genitourinary: Denies dysuria, frequency, urgency, nocturia, hesitancy, discharge, hematuria or flank pain. Musculoskeletal: Denies arthralgias, myalgias, stiffness, jt. swelling, pain, limping or strain/sprain.  Skin: Denies pruritus, rash, hives, warts, acne, eczema or change in skin lesion(s). Neuro: No weakness, tremor, incoordination, spasms, paresthesia or pain. Psychiatric: Denies confusion, memory loss or sensory loss. Endo: Denies change in weight, skin or hair change.  Heme/Lymph: No excessive bleeding, bruising or enlarged lymph nodes.  Physical Exam  BP 124/82   Pulse (!) 56   Temp (!) 97.4 F (36.3 C)   Resp 16   Ht 5' 5.5" (1.664 m)   Wt 235 lb 9.6 oz (106.9 kg)   LMP 10/29/2013 (Approximate)   SpO2 98%   BMI 38.61 kg/m   Appears  well nourished, well groomed  and in no distress.  Eyes: PERRLA, EOMs, conjunctiva no swelling or erythema. Sinuses: No frontal/maxillary tenderness ENT/Mouth: EAC's clear, TM's nl w/o erythema, bulging. Nares clear w/o erythema, swelling, exudates. Oropharynx clear without erythema or exudates. Oral hygiene is good. Tongue normal, non obstructing. Hearing intact.  Neck: Supple. Thyroid not palpable. Car 2+/2+ without bruits, nodes or JVD. Chest: Respirations nl with BS clear & equal w/o rales, rhonchi, wheezing or stridor.  Cor: Heart sounds normal w/ regular rate and rhythm without sig. murmurs, gallops, clicks or rubs. Peripheral pulses normal and equal  without edema.  Abdomen: Soft & bowel sounds normal. Non-tender w/o guarding, rebound, hernias, masses or organomegaly.  Lymphatics: Unremarkable.  Musculoskeletal: Full ROM all peripheral extremities, joint stability, 5/5 strength and normal gait.  Skin: Warm, dry without exposed rashes, lesions or ecchymosis apparent.  Neuro: Cranial nerves intact, reflexes equal bilaterally. Sensory-motor testing grossly intact. Tendon reflexes grossly intact.  Pysch: Alert & oriented x  3.  Insight and judgement nl & appropriate. No ideations.  Assessment and Plan:   1. Essential hypertension  - Continue medication, monitor blood pressure at home.  - Continue DASH diet.  Reminder to go to the ER if any CP,  SOB, nausea, dizziness, severe HA, changes vision/speech.  - CBC with Differential/Platelet - COMPLETE METABOLIC PANEL WITH GFR - Magnesium - TSH  2.  Hyperlipidemia, mixed  - Continue diet/meds, exercise,& lifestyle modifications.  - Continue monitor periodic cholesterol/liver & renal functions   - Lipid panel - TSH  3. Abnormal glucose  - Continue diet, exercise  - Lifestyle modifications.  - Monitor appropriate labs.  - Hemoglobin A1c - Insulin, random  4. Vitamin D deficiency  - Continue supplementation.  - VITAMIN D 25 Hydroxy  5. Paroxysmal atrial fibrillation (HCC)  - TSH  6. Hypothyroidism  - TSH  7. Class 3 severe obesity due to excess calories with serious  comorbidity and body mass index (BMI) of 40.0 to 44.9 in adult (HCC)  - TSH  8. Medication management  - CBC with Differential/Platelet - COMPLETE METABOLIC PANEL WITH GFR - Magnesium - Lipid panel - TSH - Hemoglobin A1c - Insulin, random - VITAMIN D 25 Hydroxy           Discussed  regular exercise, BP monitoring, weight control to achieve/maintain BMI less than 25 and discussed med and SE's. Recommended labs to assess and monitor clinical status with further disposition pending results of labs.  I discussed the assessment and treatment plan with the patient. The patient was provided an opportunity to ask questions and all were answered. The patient agreed with the plan and demonstrated an understanding of the instructions.  I provided over 30 minutes of exam, counseling, chart review and  complex critical decision making.   Kirtland Bouchard, MD

## 2020-01-28 DIAGNOSIS — M9901 Segmental and somatic dysfunction of cervical region: Secondary | ICD-10-CM | POA: Diagnosis not present

## 2020-01-28 DIAGNOSIS — M6282 Rhabdomyolysis: Secondary | ICD-10-CM | POA: Diagnosis not present

## 2020-01-28 DIAGNOSIS — M546 Pain in thoracic spine: Secondary | ICD-10-CM | POA: Diagnosis not present

## 2020-01-28 DIAGNOSIS — M9902 Segmental and somatic dysfunction of thoracic region: Secondary | ICD-10-CM | POA: Diagnosis not present

## 2020-01-28 LAB — HEMOGLOBIN A1C
Hgb A1c MFr Bld: 5.8 % of total Hgb — ABNORMAL HIGH (ref <5.7)
Mean Plasma Glucose: 120 (calc)
eAG (mmol/L): 6.6 (calc)

## 2020-01-28 LAB — LIPID PANEL
Cholesterol: 183 mg/dL (ref <200)
HDL: 43 mg/dL — ABNORMAL LOW (ref >=50)
LDL Cholesterol (Calc): 108 mg/dL (calc) — ABNORMAL HIGH
Non-HDL Cholesterol (Calc): 140 mg/dL (calc) — ABNORMAL HIGH (ref <130)
Total CHOL/HDL Ratio: 4.3 (calc) (ref <5.0)
Triglycerides: 196 mg/dL — ABNORMAL HIGH (ref <150)

## 2020-01-28 LAB — CBC WITH DIFFERENTIAL/PLATELET
Absolute Monocytes: 874 cells/uL (ref 200–950)
Basophils Absolute: 81 cells/uL (ref 0–200)
Basophils Relative: 0.7 %
Eosinophils Absolute: 196 cells/uL (ref 15–500)
Eosinophils Relative: 1.7 %
HCT: 40.5 % (ref 35.0–45.0)
Hemoglobin: 13.3 g/dL (ref 11.7–15.5)
Lymphs Abs: 3266 cells/uL (ref 850–3900)
MCH: 29 pg (ref 27.0–33.0)
MCHC: 32.8 g/dL (ref 32.0–36.0)
MCV: 88.2 fL (ref 80.0–100.0)
MPV: 10 fL (ref 7.5–12.5)
Monocytes Relative: 7.6 %
Neutro Abs: 7084 cells/uL (ref 1500–7800)
Neutrophils Relative %: 61.6 %
Platelets: 311 10*3/uL (ref 140–400)
RBC: 4.59 10*6/uL (ref 3.80–5.10)
RDW: 13.1 % (ref 11.0–15.0)
Total Lymphocyte: 28.4 %
WBC: 11.5 10*3/uL — ABNORMAL HIGH (ref 3.8–10.8)

## 2020-01-28 LAB — COMPLETE METABOLIC PANEL WITH GFR
AG Ratio: 1.9 (calc) (ref 1.0–2.5)
ALT: 16 U/L (ref 6–29)
AST: 15 U/L (ref 10–35)
Albumin: 4.6 g/dL (ref 3.6–5.1)
Alkaline phosphatase (APISO): 63 U/L (ref 37–153)
BUN: 23 mg/dL (ref 7–25)
CO2: 27 mmol/L (ref 20–32)
Calcium: 10 mg/dL (ref 8.6–10.4)
Chloride: 103 mmol/L (ref 98–110)
Creat: 0.91 mg/dL (ref 0.50–0.99)
GFR, Est African American: 79 mL/min/{1.73_m2} (ref >=60)
GFR, Est Non African American: 68 mL/min/{1.73_m2} (ref >=60)
Globulin: 2.4 g/dL (calc) (ref 1.9–3.7)
Glucose, Bld: 103 mg/dL — ABNORMAL HIGH (ref 65–99)
Potassium: 4.7 mmol/L (ref 3.5–5.3)
Sodium: 140 mmol/L (ref 135–146)
Total Bilirubin: 0.4 mg/dL (ref 0.2–1.2)
Total Protein: 7 g/dL (ref 6.1–8.1)

## 2020-01-28 LAB — TSH: TSH: 3.4 mIU/L (ref 0.40–4.50)

## 2020-01-28 LAB — VITAMIN D 25 HYDROXY (VIT D DEFICIENCY, FRACTURES): Vit D, 25-Hydroxy: 83 ng/mL (ref 30–100)

## 2020-01-28 LAB — INSULIN, RANDOM: Insulin: 20 u[IU]/mL — ABNORMAL HIGH

## 2020-01-28 LAB — MAGNESIUM: Magnesium: 2.5 mg/dL (ref 1.5–2.5)

## 2020-01-28 NOTE — Progress Notes (Signed)
========================================================== -   Test results slightly outside the reference range are not unusual. If there is anything important, I will review this with you,  otherwise it is considered normal test values.  If you have further questions,  please do not hesitate to contact me at the office or via My Chart.  ==========================================================  -  Total Chol = 183 - Great , But   - Bad LDL Chol = 108 - is elevated  (Ideal or Goal is less than 70  !  )  - So. . . . . . . . . . . Continue Ezetimibe (Zetia) and   - Recommend a stricter low cholesterol diet   - Cholesterol only comes from animal sources  - ie. meat, dairy, egg yolks  - Eat all the vegetables you want.  - Avoid meat, especially red meat - Beef AND Pork .  - Avoid cheese & dairy - milk & ice cream.     - Cheese is the most concentrated form of trans-fats which  is the worst thing to clog up our arteries.   - Veggie cheese is OK which can be found in the fresh  produce section at [employer] or [employer] or [employer] ==========================================================  -  Also Triglycerides (   196   ) or fats in blood are too high  (goal is less than 150)    - Recommend avoid fried & greasy foods,  sweets / candy,   - Avoid white rice  (brown or wild rice or Quinoa is OK),   - Avoid white potatoes  (sweet potatoes are OK)   - Avoid anything made from white flour  - bagels, doughnuts, rolls, buns, biscuits, white and   wheat breads, pizza crust and traditional  pasta made of white flour & egg white  - (vegetarian pasta or spinach or wheat pasta is OK).    - Multi-grain bread is OK - like multi-grain flat bread or  sandwich thins.   - Avoid alcohol in excess.   - Exercise is also important. ==========================================================  -  A1c = 5.8% - Still too high   - Avoid Sweets, Candy & White Stuff   - Rice,  Potatoes, Breads &  Pasta ==========================================================  -  Vitamin d = 83 - Excellent  ==========================================================  -  All Else - CBC - Kidneys - Electrolytes - Liver - Magnesium & Thyroid    - all  Normal / OK ==========================================================

## 2020-02-03 ENCOUNTER — Other Ambulatory Visit (HOSPITAL_COMMUNITY)
Admission: RE | Admit: 2020-02-03 | Discharge: 2020-02-03 | Disposition: A | Discharge status: Home / Self Care | Payer: BC Managed Care – PPO | Source: Ambulatory Visit | Attending: Adult Health | Admitting: Adult Health

## 2020-02-03 ENCOUNTER — Ambulatory Visit
Admission: RE | Admit: 2020-02-03 | Discharge: 2020-02-03 | Disposition: A | Discharge status: Home / Self Care | Payer: BC Managed Care – PPO | Source: Ambulatory Visit | Attending: Adult Health | Admitting: Adult Health

## 2020-02-03 DIAGNOSIS — E042 Nontoxic multinodular goiter: Secondary | ICD-10-CM

## 2020-02-03 DIAGNOSIS — E041 Nontoxic single thyroid nodule: Secondary | ICD-10-CM | POA: Diagnosis not present

## 2020-02-04 DIAGNOSIS — M9901 Segmental and somatic dysfunction of cervical region: Secondary | ICD-10-CM | POA: Diagnosis not present

## 2020-02-04 DIAGNOSIS — M9902 Segmental and somatic dysfunction of thoracic region: Secondary | ICD-10-CM | POA: Diagnosis not present

## 2020-02-04 DIAGNOSIS — M62830 Muscle spasm of back: Secondary | ICD-10-CM | POA: Diagnosis not present

## 2020-02-04 DIAGNOSIS — M5382 Other specified dorsopathies, cervical region: Secondary | ICD-10-CM | POA: Diagnosis not present

## 2020-02-04 LAB — CYTOLOGY - NON PAP

## 2020-02-04 NOTE — Telephone Encounter (Signed)
Noted  

## 2020-02-05 DIAGNOSIS — M9901 Segmental and somatic dysfunction of cervical region: Secondary | ICD-10-CM | POA: Diagnosis not present

## 2020-02-05 DIAGNOSIS — M5382 Other specified dorsopathies, cervical region: Secondary | ICD-10-CM | POA: Diagnosis not present

## 2020-02-05 DIAGNOSIS — M62830 Muscle spasm of back: Secondary | ICD-10-CM | POA: Diagnosis not present

## 2020-02-05 DIAGNOSIS — M9902 Segmental and somatic dysfunction of thoracic region: Secondary | ICD-10-CM | POA: Diagnosis not present

## 2020-02-08 ENCOUNTER — Telehealth: Payer: Self-pay

## 2020-02-08 DIAGNOSIS — M9901 Segmental and somatic dysfunction of cervical region: Secondary | ICD-10-CM | POA: Diagnosis not present

## 2020-02-08 DIAGNOSIS — M5382 Other specified dorsopathies, cervical region: Secondary | ICD-10-CM | POA: Diagnosis not present

## 2020-02-08 DIAGNOSIS — M62830 Muscle spasm of back: Secondary | ICD-10-CM | POA: Diagnosis not present

## 2020-02-08 DIAGNOSIS — E042 Nontoxic multinodular goiter: Secondary | ICD-10-CM

## 2020-02-08 DIAGNOSIS — M9902 Segmental and somatic dysfunction of thoracic region: Secondary | ICD-10-CM | POA: Diagnosis not present

## 2020-02-08 NOTE — Telephone Encounter (Signed)
Patient calling in stating she had a missed call. Was in chart to see who called

## 2020-02-09 ENCOUNTER — Ambulatory Visit
Admission: RE | Admit: 2020-02-09 | Discharge: 2020-02-09 | Disposition: A | Discharge status: Home / Self Care | Payer: BC Managed Care – PPO | Source: Ambulatory Visit | Attending: Internal Medicine | Admitting: Internal Medicine

## 2020-02-09 ENCOUNTER — Other Ambulatory Visit: Payer: Self-pay

## 2020-02-09 DIAGNOSIS — Z1231 Encounter for screening mammogram for malignant neoplasm of breast: Secondary | ICD-10-CM

## 2020-02-10 DIAGNOSIS — M62830 Muscle spasm of back: Secondary | ICD-10-CM | POA: Diagnosis not present

## 2020-02-10 DIAGNOSIS — M5382 Other specified dorsopathies, cervical region: Secondary | ICD-10-CM | POA: Diagnosis not present

## 2020-02-10 DIAGNOSIS — M9902 Segmental and somatic dysfunction of thoracic region: Secondary | ICD-10-CM | POA: Diagnosis not present

## 2020-02-10 DIAGNOSIS — M9901 Segmental and somatic dysfunction of cervical region: Secondary | ICD-10-CM | POA: Diagnosis not present

## 2020-02-10 NOTE — Progress Notes (Signed)
PCP:  Unk Pinto, MD Primary Cardiologist: Will Meredith Leeds, MD Electrophysiologist: Constance Haw, MD   Monica Morgan is a 59 y.o. female seen today for Will Meredith Leeds, MD for cardiac clearance.  Since last being seen in our clinic the patient reports doing very well. She was on a weight loss drug that caused to have breakthrough palpitations, but she has not had any further since stopping. She is now pursuing weight loss through diet and exercise where able. She is limited by arthritis in her knees and back. Uses lasix prn 1-2 times a month.  she denies chest pain, dyspnea, PND, orthopnea, nausea, vomiting, dizziness, syncope, weight gain, or early satiety.  Past Medical History:  Diagnosis Date   Arthritis    "probably in my knees; maybe in my hands" (04/11/2017)   Asthma attack 10/2015 X 1   Atrial flutter ([place]) 10/2015   Heart murmur    "comes and goes" (04/11/2017)   Hepatitis 1972   "don't know what kind"   High cholesterol    Hypertension    Hypothyroidism    Malignant melanoma of leg ([place])    "right thigh"   PAF (paroxysmal atrial fibrillation) ([place])    Pneumonia 1966; 1967   "left lung collapsed one of these times"   Psoriasis    Rosacea    Vitamin D deficiency    Past Surgical History:  Procedure Laterality Date   A-FLUTTER ABLATION N/A 02/08/2017   Procedure: A-Flutter Ablation;  Surgeon: Constance Haw, MD;  Location: [facility];  Service: Cardiovascular;  Laterality: N/A;   ATRIAL FIBRILLATION ABLATION N/A 04/11/2017   Procedure: ATRIAL FIBRILLATION ABLATION;  Surgeon: Constance Haw, MD;  Location: [facility];  Service: Cardiovascular;  Laterality: N/A;   CARDIOVERSION N/A 10/25/2015   Procedure: CARDIOVERSION;  Surgeon: Sueanne Margarita, MD;  Location: [facility];  Service: Cardiovascular;  Laterality: N/A;   [place]    ELECTROPHYSIOLOGIC STUDY N/A 01/13/2016   Procedure: Atrial Fibrillation Ablation;  Surgeon: Will Meredith Leeds, MD;  Location: [facility];  Service: Cardiovascular;  Laterality: N/A;   ENDOMETRIAL ABLATION  ~ 2012   MELANOMA EXCISION Right 10/1975   outer thigh "malignant"   REFRACTIVE SURGERY Bilateral 1990s   TEE WITHOUT CARDIOVERSION N/A 10/25/2015   Procedure: TRANSESOPHAGEAL ECHOCARDIOGRAM (TEE);  Surgeon: Sueanne Margarita, MD;  Location: [facility];  Service: Cardiovascular;  Laterality: N/A;   TEE WITHOUT CARDIOVERSION N/A 01/11/2016   Procedure: TRANSESOPHAGEAL ECHOCARDIOGRAM (TEE);  Surgeon: Josue Hector, MD;  Location: [facility] ENDOSCOPY;  Service: Cardiovascular;  Laterality: N/A;   TONSILLECTOMY  1967   TUBAL LIGATION      Current Outpatient Medications  Medication Sig Dispense Refill   Ascorbic Acid (VITAMIN C) 500 MG CHEW Chew by mouth. 2 times per day      augmented betamethasone dipropionate (DIPROLENE-AF) 0.05 % cream APPLY VERY SPARINGLY TO PSORIASIS RASH (DO NOT USE ON FACE ! ) 50 g 5   Black Pepper-Turmeric (TURMERIC COMPLEX/BLACK PEPPER) 3-500 MG CAPS Take by mouth.     Calcium 600-400 MG-UNIT CHEW Chew 1 each by mouth 2 (two) times daily.      Cholecalciferol (VITAMIN D PO) Take 10,000 Units by mouth daily.      Coenzyme Q10 (CO Q10) 100 MG CAPS Take 100 mg by mouth daily.      cyclobenzaprine (FLEXERIL) 5 MG tablet Take 1 tablet (  5 mg total) by mouth 3 (three) times daily as needed for muscle spasms. 60 tablet 0   diltiazem (CARDIZEM CD) 120 MG 24 hr capsule Take 1 capsule (120 mg total) by mouth daily. 90 capsule 3   ELIQUIS 5 MG TABS tablet TAKE 1 TABLET BY MOUTH TWICE A DAY 60 tablet 5   ezetimibe (ZETIA) 10 MG tablet TAKE 1 TABLET DAILY FOR CHOLESTREROL 90 tablet 3   flecainide (TAMBOCOR) 100 MG tablet TAKE 1 TABLET BY MOUTH TWICE A DAY 180 tablet 2   fluticasone (FLONASE) 50 MCG/ACT nasal spray Place 1-2 sprays into both nostrils at bedtime.  16 g 1   furosemide (LASIX) 20 MG tablet Take 1 tablet Daily for Fluid Retention 90 tablet 1   levothyroxine (SYNTHROID) 50 MCG tablet TAKE 1 TABLET DAILY ON EMPTY STOMACH WITH ONLY WATER FOR 30 MIN (NO ANTACID MEDS, CALCIUM OR MAGNESIUM FOR 4 HOURS & AVOID BIOTIN) 90 tablet 3   loratadine (CLARITIN) 10 MG tablet TAKE 10 MG BY MOUTH DAILY AS NEEDED FOR ALLERGY RELIEF  0   Magnesium 500 MG TABS Take 500 mg by mouth every evening.      montelukast (SINGULAIR) 10 MG tablet TAKE 1 TABLET BY MOUTH DAILY FOR ALLERGIES 90 tablet 0   Multiple Vitamin (MULTIVITAMIN WITH MINERALS) TABS tablet Take 1 tablet by mouth daily.     NONFORMULARY OR COMPOUNDED ITEM Apply 1 each topically See admin instructions. Progesterone 8%/58ml Apply 0.66mls topically every day except Saturday.     OVER THE COUNTER MEDICATION in the morning and at bedtime. Taking Ferrous Sulfate 325 mg daily.     Polyethyl Glycol-Propyl Glycol (SYSTANE OP) Place 1 drop into both eyes daily.     Probiotic Product (PROBIOTIC DAILY PO) Take 1 tablet by mouth daily.      Turmeric 500 MG CAPS Take 500 mg by mouth 2 (two) times daily.     buPROPion (WELLBUTRIN XL) 150 MG 24 hr tablet Take 1 tablet Daily for Mood, Focus & Concentration 90 tablet 0   NONFORMULARY OR COMPOUNDED ITEM Place 1 each vaginally See admin instructions. Estriol/testosterone 0.25-0.25 vaginal suppository compound. Insert vaginally on Sunday, Monday and Wednesday     No current facility-administered medications for this visit.    Allergies  Allergen Reactions   Lipitor [Atorvastatin] Other (See Comments)    "neck pain"   Metoprolol Other (See Comments)    DIDN'T WORK FOR PATIENT AND GAINED WEIGHT MIGHT HAVE BEEN TAKEN WITH RYTHMOL   Phentermine Other (See Comments)    Did not help, Insomnia    Rythmol [Propafenone] Other (See Comments)    "weight gain"   Sulfa Drugs Cross Reactors Other (See Comments)    "soreness all over"   Lisinopril Cough     Social History   Socioeconomic History   Marital status: Divorced    Spouse name: Not on file   Number of children: Not on file   Years of education: Not on file   Highest education level: Not on file  Occupational History   Not on file  Tobacco Use   Smoking status: Never Smoker   Smokeless tobacco: Never Used  Vaping Use   Vaping Use: Never used  Substance and Sexual Activity   Alcohol use: Yes    Comment: 04/11/2017 "q couple months I'll have a couple drinks "   Drug use: No   Sexual activity: Not Currently  Other Topics Concern   Not on file  Social History Narrative  Not on file   Social Determinants of Health   Financial Resource Strain:    Difficulty of Paying Living Expenses: Not on file  Food Insecurity:    Worried About [place] in the Last Year: Not on file   Ran Out of Food in the Last Year: Not on file  Transportation Needs:    Lack of Transportation (Medical): Not on file   Lack of Transportation (Non-Medical): Not on file  Physical Activity:    Days of Exercise per Week: Not on file   Minutes of Exercise per Session: Not on file  Stress:    Feeling of Stress : Not on file  Social Connections:    Frequency of Communication with Friends and Family: Not on file   Frequency of Social Gatherings with Friends and Family: Not on file   Attends Religious Services: Not on file   Active Member of Clubs or Organizations: Not on file   Attends Archivist Meetings: Not on file   Marital Status: Not on file  Intimate Partner Violence:    Fear of Current or Ex-Partner: Not on file   Emotionally Abused: Not on file   Physically Abused: Not on file   Sexually Abused: Not on file    Review of Systems: General: No chills, fever, night sweats or weight changes  Cardiovascular:  No chest pain, dyspnea on exertion, edema, orthopnea, palpitations, paroxysmal nocturnal dyspnea Dermatological: No rash, lesions  or masses Respiratory: No cough, dyspnea Urologic: No hematuria, dysuria Abdominal: No nausea, vomiting, diarrhea, bright red blood per rectum, melena, or hematemesis Neurologic: No visual changes, weakness, changes in mental status All other systems reviewed and are otherwise negative except as noted above.  Physical Exam: Vitals:   02/12/20 0807  BP: 120/72  Pulse: 68  SpO2: 94%  Weight: 234 lb 12.8 oz (106.5 kg)  Height: 5\' 6"  (1.676 m)    GEN- The patient is well appearing, alert and oriented x 3 today.   HEENT: normocephalic, atraumatic; sclera clear, conjunctiva pink; hearing intact; oropharynx clear; neck supple, no JVP Lymph- no cervical lymphadenopathy Lungs- Clear to ausculation bilaterally, normal work of breathing.  No wheezes, rales, rhonchi Heart- Regular rate and rhythm, no murmurs, rubs or gallops, PMI not laterally displaced GI- soft, non-tender, non-distended, bowel sounds present, no hepatosplenomegaly Extremities- no clubbing, cyanosis, or edema; DP/PT/radial pulses 2+ bilaterally MS- no significant deformity or atrophy Skin- warm and dry, no rash or lesion Psych- euthymic mood, full affect Neuro- strength and sensation are intact  EKG is ordered. Personal review of EKG from today shows NSr at 68 bpm with QRS 126 ms and PR interval 200 ms  (QRS appears to run 110-112 range typically)  Additional studies reviewed include: Previous EP office notes  Assessment and Plan:  1.  Persistent atrial fibrillation and atypical atrial flutter s/p ablation 03/2017 NSR by EKG today with relatively stable intervals Continue Eliquis for CHA2DS2VASC of at least 2   Continue flecainide and diltiazem  2. HTN Continue current medications  3. Mild OSA Not on CPAP  4. Obesity Body mass index is 37.9 kg/m.  Encouraged exercise and lifestyle modifications  5. Cardiac clearance for D&C Hysteroscopy Echo 12/2016 showed LVEF 55-60% ETT 02/2017 with HTN response to  exercise, no ST segment deviation, and limited test overall due to inadequate heart rate. She is at a relatively low risk of perioperative complications from a cardiac perspective by the Revised Cardiac Risk Index [place] Criteria) and OK to  proceed without further cardiac work up.  Per office protocol she may hold her Eliquis 2-3 days prior to her procedure, and resume as soon as safe after (ideally the evening of the same day as procedure vs the following day).  RTC to see Dr. Curt Bears in 6 months for flecainide follow up.   Shirley Friar, PA-C  02/12/20 8:09 AM

## 2020-02-12 ENCOUNTER — Other Ambulatory Visit: Payer: Self-pay | Admitting: Internal Medicine

## 2020-02-12 ENCOUNTER — Other Ambulatory Visit: Payer: Self-pay

## 2020-02-12 ENCOUNTER — Ambulatory Visit: Payer: BC Managed Care – PPO | Admitting: Student

## 2020-02-12 ENCOUNTER — Encounter: Payer: Self-pay | Admitting: Student

## 2020-02-12 VITALS — BP 120/72 | HR 68 | Ht 66.0 in | Wt 234.8 lb

## 2020-02-12 DIAGNOSIS — I10 Essential (primary) hypertension: Secondary | ICD-10-CM

## 2020-02-12 DIAGNOSIS — I4819 Other persistent atrial fibrillation: Secondary | ICD-10-CM

## 2020-02-12 DIAGNOSIS — M9901 Segmental and somatic dysfunction of cervical region: Secondary | ICD-10-CM | POA: Diagnosis not present

## 2020-02-12 DIAGNOSIS — M5382 Other specified dorsopathies, cervical region: Secondary | ICD-10-CM | POA: Diagnosis not present

## 2020-02-12 DIAGNOSIS — M9902 Segmental and somatic dysfunction of thoracic region: Secondary | ICD-10-CM | POA: Diagnosis not present

## 2020-02-12 DIAGNOSIS — M62830 Muscle spasm of back: Secondary | ICD-10-CM | POA: Diagnosis not present

## 2020-02-12 DIAGNOSIS — I484 Atypical atrial flutter: Secondary | ICD-10-CM

## 2020-02-12 NOTE — Patient Instructions (Signed)
Medication Instructions:  *If you need a refill on your cardiac medications before your next appointment, please call your pharmacy*  Follow-Up: At [facility], you and your health needs are our priority.  As part of our continuing mission to provide you with exceptional heart care, we have created designated Provider Care Teams.  These Care Teams include your primary Cardiologist (physician) and Advanced Practice Providers (APPs -  Physician Assistants and Nurse Practitioners) who all work together to provide you with the care you need, when you need it.  We recommend signing up for the patient portal called "MyChart".  Sign up information is provided on this After Visit Summary.  MyChart is used to connect with patients for Virtual Visits (Telemedicine).  Patients are able to view lab/test results, encounter notes, upcoming appointments, etc.  Non-urgent messages can be sent to your provider as well.   To learn more about what you can do with MyChart, go to [email or web address].    Your next appointment:   Your physician wants you to follow-up in: 48 MONTHS with Dr. Curt Bears. You will receive a reminder letter in the mail two months in advance. If you don't receive a letter, please call our office to schedule the follow-up appointment.  The format for your next appointment:   In Person with Allegra Lai, MD

## 2020-02-14 ENCOUNTER — Other Ambulatory Visit: Payer: Self-pay | Admitting: Internal Medicine

## 2020-02-16 ENCOUNTER — Encounter: Payer: Self-pay | Admitting: Internal Medicine

## 2020-02-18 DIAGNOSIS — M5382 Other specified dorsopathies, cervical region: Secondary | ICD-10-CM | POA: Diagnosis not present

## 2020-02-18 DIAGNOSIS — M9901 Segmental and somatic dysfunction of cervical region: Secondary | ICD-10-CM | POA: Diagnosis not present

## 2020-02-18 DIAGNOSIS — M9902 Segmental and somatic dysfunction of thoracic region: Secondary | ICD-10-CM | POA: Diagnosis not present

## 2020-02-18 DIAGNOSIS — M62830 Muscle spasm of back: Secondary | ICD-10-CM | POA: Diagnosis not present

## 2020-02-24 NOTE — Telephone Encounter (Signed)
Called [facility] to confirm Clearance received . Left message with surgery scheduler Shanelle to call to verify faxed clearance received.

## 2020-02-24 NOTE — Telephone Encounter (Signed)
[facility] was calling to check on the status of the patient's surgical clearance

## 2020-02-24 NOTE — Telephone Encounter (Addendum)
   Primary Cardiologist: Will Meredith Leeds, MD  Chart reviewed as part of pre-operative protocol coverage.   Patient was seen by Oda Kilts, PA-C 02/12/20 for preoperative assessment. Deemed acceptable risk for surgery without further cardiac work-up at that time.   Per pharmacy recommendations, patient can hold eliquis 2-3 days prior to her upcoming GYN surgery and should restart when cleared to do so by her gynecologist.   I will route this recommendation and office note to the requesting party via [place] fax function and remove from pre-op pool.  Please call with questions.  Abigail Butts, PA-C 02/24/2020, 9:32 AM

## 2020-02-25 NOTE — Telephone Encounter (Signed)
Clearance manually faxed to requesting office today.

## 2020-02-25 NOTE — Telephone Encounter (Signed)
    Shanelle from [facility] calling, she said she did not receive fax for pt clearance. She gave Fax [phone] and she said to ATTN it to her

## 2020-03-03 DIAGNOSIS — M62830 Muscle spasm of back: Secondary | ICD-10-CM | POA: Diagnosis not present

## 2020-03-03 DIAGNOSIS — M9901 Segmental and somatic dysfunction of cervical region: Secondary | ICD-10-CM | POA: Diagnosis not present

## 2020-03-03 DIAGNOSIS — M5382 Other specified dorsopathies, cervical region: Secondary | ICD-10-CM | POA: Diagnosis not present

## 2020-03-03 DIAGNOSIS — M9902 Segmental and somatic dysfunction of thoracic region: Secondary | ICD-10-CM | POA: Diagnosis not present

## 2020-03-08 ENCOUNTER — Ambulatory Visit
Admission: RE | Admit: 2020-03-08 | Discharge: 2020-03-08 | Disposition: A | Discharge status: Home / Self Care | Payer: BC Managed Care – PPO | Source: Ambulatory Visit | Attending: Adult Health | Admitting: Adult Health

## 2020-03-08 ENCOUNTER — Other Ambulatory Visit (HOSPITAL_COMMUNITY)
Admission: RE | Admit: 2020-03-08 | Discharge: 2020-03-08 | Disposition: A | Discharge status: Home / Self Care | Payer: BC Managed Care – PPO | Source: Ambulatory Visit | Attending: Interventional Radiology | Admitting: Interventional Radiology

## 2020-03-08 DIAGNOSIS — D34 Benign neoplasm of thyroid gland: Secondary | ICD-10-CM | POA: Insufficient documentation

## 2020-03-08 DIAGNOSIS — E041 Nontoxic single thyroid nodule: Secondary | ICD-10-CM | POA: Diagnosis not present

## 2020-03-08 DIAGNOSIS — E042 Nontoxic multinodular goiter: Secondary | ICD-10-CM

## 2020-03-09 LAB — CYTOLOGY - NON PAP

## 2020-03-14 NOTE — Progress Notes (Signed)
   History of Present Illness:     Patient is a very nice 59 yo DWF who presents with several months c/o of a neck "mass " or lump in her lower neck at the suprasternal notch.  In Sept she had Thyroid U/S which showed a nontoxic MNG and had a fine needle US guided bx on 02/03/2020  and  03/08/2020 of a rt thyroid nodule with path returning  Benign. Patient's concerns are that her lump is several inches below her thyroid where the bx's were taken.   Medications  .  levothyroxine 50 MCG tablet, TAKE 1 TABLET DAILY  .  diltiazem CD 120 MG , Take 1 capsule  daily. Marland Kitchen  ezetimibe  10 MG tablet, TAKE 1 TABLET DAILY  .  flecainide (TAMBOCOR) 100 MG , TAKE 1 TABLET  TWICE A DAY .  furosemide 20 MG t, Take 1 tablet Daily f .  FLONASE nasal spray, Place 1-2 sprays into both nostrils at bedtime. Marland Kitchen  loratadine  10 MG , TAKE  DAILY  .  montelukast 10 MG , Take 1 tablet Daily      .  ELIQUIS 5 MG , TAKE 1 TABLET  TWICE A DAY  Current Outpatient Medications :  Marland Kitchen  VITAMIN C 500 MG , Chew 2 times per day  .  DIPROLENE-AF) 0.05 % crm, APPLY  PSORIASIS RASH  .  TURMERIC COMPLEX/BLACK PEPPER 3-500 MG CAPS, Take daily .  buPROPion-XL)150 MG , Take 1 tablet Daily .  Calcium 600-400 MG-UNIT ,  1 each 2 ( times daily.  Marland Kitchen VITAMIN D, Take 10,000 Units daily.  .  Coenzyme Q10  100 MG CAPS, Take 100 mg daily.  .  cyclobenzaprine 5 MG tablet, Take 1 tablet 3 (imes daily as needed f .  Magnesium 500 MG TABS, Take every evening.  .  Multiple Vitamin Take 1 tablet daily. . Estriol/testosterone 0.25-0.25 vaginal suppository cpd. Insert vaginally on Sunday, Monday and Wednesday .  Progesterone 8%/61ml Apply 0.81mls topically every day except Saturday. .  Taking Ferrous Sulfate 325 mg daily. Carren Rang, Place 1 drop into both eyes daily. .  Probiotic , Take 1 tablet daily.  .  Turmeric 500 MG CAPS, Take 2times daily.  Problem list She has Hyperlipidemia, mixed; Vitamin D deficiency; Medication management;  Hypothyroidism; Abnormal glucose; Morbid obesity ([place]); Atrial flutter with rapid ventricular response ([place]); Persistent atrial fibrillation ([place]); AF (atrial fibrillation) ([place]); Psoriasis of scalp; Essential hypertension; and Multinodular thyroid on their problem list.   Observations/Objective:   BP 130/76   P 63   T 97.3 F   Wt 236 lb   SpO2 98%   BMI 38.09   HEENT - WNL. Neck - supple with a questional palpalble sub-cutaneous mass at the suprasternal notch. Chest - Clear equal BS. Cor - Nl HS. RRR w/o sig MGR. PP 1(+). No edema. MS- FROM w/o deformities.  Gait Nl. Neuro -  Nl w/o focal abnormalities.  Assessment and Plan:  1. Neck mass  - CT Soft Tissue Neck W Contrast; Future  Follow Up Instructions:        I discussed the assessment and treatment plan with the patient. The patient was provided an opportunity to ask questions and all were answered. The patient agreed with the plan and demonstrated an understanding of the instructions.     Kirtland Bouchard, MD

## 2020-03-15 ENCOUNTER — Encounter: Payer: Self-pay | Admitting: Internal Medicine

## 2020-03-15 ENCOUNTER — Ambulatory Visit: Payer: BC Managed Care – PPO | Admitting: Internal Medicine

## 2020-03-15 ENCOUNTER — Ambulatory Visit: Payer: BC Managed Care – PPO | Admitting: Cardiology

## 2020-03-15 ENCOUNTER — Other Ambulatory Visit: Payer: Self-pay

## 2020-03-15 VITALS — BP 130/76 | HR 63 | Temp 97.3°F | Ht 65.5 in | Wt 236.0 lb

## 2020-03-15 DIAGNOSIS — R221 Localized swelling, mass and lump, neck: Secondary | ICD-10-CM | POA: Diagnosis not present

## 2020-03-17 DIAGNOSIS — M5382 Other specified dorsopathies, cervical region: Secondary | ICD-10-CM | POA: Diagnosis not present

## 2020-03-17 DIAGNOSIS — M9902 Segmental and somatic dysfunction of thoracic region: Secondary | ICD-10-CM | POA: Diagnosis not present

## 2020-03-17 DIAGNOSIS — M62830 Muscle spasm of back: Secondary | ICD-10-CM | POA: Diagnosis not present

## 2020-03-17 DIAGNOSIS — M9901 Segmental and somatic dysfunction of cervical region: Secondary | ICD-10-CM | POA: Diagnosis not present

## 2020-03-21 ENCOUNTER — Other Ambulatory Visit: Payer: Self-pay | Admitting: Internal Medicine

## 2020-03-21 DIAGNOSIS — N950 Postmenopausal bleeding: Secondary | ICD-10-CM | POA: Diagnosis not present

## 2020-03-22 DIAGNOSIS — H35412 Lattice degeneration of retina, left eye: Secondary | ICD-10-CM | POA: Diagnosis not present

## 2020-03-22 DIAGNOSIS — H40013 Open angle with borderline findings, low risk, bilateral: Secondary | ICD-10-CM | POA: Diagnosis not present

## 2020-03-22 DIAGNOSIS — Z9889 Other specified postprocedural states: Secondary | ICD-10-CM | POA: Diagnosis not present

## 2020-03-28 DIAGNOSIS — N950 Postmenopausal bleeding: Secondary | ICD-10-CM | POA: Diagnosis not present

## 2020-03-28 HISTORY — PX: HYSTEROSCOPY: SHX211

## 2020-03-31 DIAGNOSIS — M9902 Segmental and somatic dysfunction of thoracic region: Secondary | ICD-10-CM | POA: Diagnosis not present

## 2020-03-31 DIAGNOSIS — M5382 Other specified dorsopathies, cervical region: Secondary | ICD-10-CM | POA: Diagnosis not present

## 2020-03-31 DIAGNOSIS — M9901 Segmental and somatic dysfunction of cervical region: Secondary | ICD-10-CM | POA: Diagnosis not present

## 2020-03-31 DIAGNOSIS — M62830 Muscle spasm of back: Secondary | ICD-10-CM | POA: Diagnosis not present

## 2020-04-04 ENCOUNTER — Ambulatory Visit
Admission: RE | Admit: 2020-04-04 | Discharge: 2020-04-04 | Disposition: A | Discharge status: Home / Self Care | Payer: BC Managed Care – PPO | Source: Ambulatory Visit | Attending: Internal Medicine | Admitting: Internal Medicine

## 2020-04-04 DIAGNOSIS — R222 Localized swelling, mass and lump, trunk: Secondary | ICD-10-CM | POA: Diagnosis not present

## 2020-04-04 DIAGNOSIS — M47812 Spondylosis without myelopathy or radiculopathy, cervical region: Secondary | ICD-10-CM | POA: Diagnosis not present

## 2020-04-04 DIAGNOSIS — E041 Nontoxic single thyroid nodule: Secondary | ICD-10-CM | POA: Diagnosis not present

## 2020-04-04 DIAGNOSIS — R221 Localized swelling, mass and lump, neck: Secondary | ICD-10-CM

## 2020-04-04 MED ORDER — IOPAMIDOL (ISOVUE-300) INJECTION 61%
75.0000 mL | Freq: Once | INTRAVENOUS | Status: AC | PRN
  Administered 2020-04-04: 75 mL via INTRAVENOUS

## 2020-04-04 NOTE — Progress Notes (Signed)
========================================================== ==========================================================  -    CT scan of Neck and upper chest down to sternum (breast bone)  where your concerns of a lump are - - > > find No abnormalities. !        [place] !

## 2020-04-05 ENCOUNTER — Other Ambulatory Visit: Payer: Self-pay | Admitting: Cardiology

## 2020-04-13 DIAGNOSIS — M62830 Muscle spasm of back: Secondary | ICD-10-CM | POA: Diagnosis not present

## 2020-04-13 DIAGNOSIS — M9902 Segmental and somatic dysfunction of thoracic region: Secondary | ICD-10-CM | POA: Diagnosis not present

## 2020-04-13 DIAGNOSIS — M9901 Segmental and somatic dysfunction of cervical region: Secondary | ICD-10-CM | POA: Diagnosis not present

## 2020-04-13 DIAGNOSIS — M5382 Other specified dorsopathies, cervical region: Secondary | ICD-10-CM | POA: Diagnosis not present

## 2020-04-18 ENCOUNTER — Encounter: Payer: Self-pay | Admitting: Internal Medicine

## 2020-04-27 DIAGNOSIS — M9901 Segmental and somatic dysfunction of cervical region: Secondary | ICD-10-CM | POA: Diagnosis not present

## 2020-04-27 DIAGNOSIS — M5382 Other specified dorsopathies, cervical region: Secondary | ICD-10-CM | POA: Diagnosis not present

## 2020-04-27 DIAGNOSIS — M9902 Segmental and somatic dysfunction of thoracic region: Secondary | ICD-10-CM | POA: Diagnosis not present

## 2020-04-27 DIAGNOSIS — M62830 Muscle spasm of back: Secondary | ICD-10-CM | POA: Diagnosis not present

## 2020-05-04 ENCOUNTER — Other Ambulatory Visit: Payer: Self-pay

## 2020-05-04 ENCOUNTER — Encounter: Payer: Self-pay | Admitting: Adult Health

## 2020-05-04 ENCOUNTER — Ambulatory Visit: Payer: BC Managed Care – PPO | Admitting: Adult Health

## 2020-05-04 VITALS — BP 120/86 | HR 64 | Temp 97.2°F | Ht 65.0 in | Wt 232.4 lb

## 2020-05-04 DIAGNOSIS — Z0000 Encounter for general adult medical examination without abnormal findings: Secondary | ICD-10-CM

## 2020-05-04 DIAGNOSIS — I4892 Unspecified atrial flutter: Secondary | ICD-10-CM

## 2020-05-04 DIAGNOSIS — E039 Hypothyroidism, unspecified: Secondary | ICD-10-CM

## 2020-05-04 DIAGNOSIS — Z1329 Encounter for screening for other suspected endocrine disorder: Secondary | ICD-10-CM

## 2020-05-04 DIAGNOSIS — Z79899 Other long term (current) drug therapy: Secondary | ICD-10-CM | POA: Diagnosis not present

## 2020-05-04 DIAGNOSIS — Z131 Encounter for screening for diabetes mellitus: Secondary | ICD-10-CM

## 2020-05-04 DIAGNOSIS — I4819 Other persistent atrial fibrillation: Secondary | ICD-10-CM

## 2020-05-04 DIAGNOSIS — Z85820 Personal history of malignant melanoma of skin: Secondary | ICD-10-CM

## 2020-05-04 DIAGNOSIS — R7309 Other abnormal glucose: Secondary | ICD-10-CM

## 2020-05-04 DIAGNOSIS — I10 Essential (primary) hypertension: Secondary | ICD-10-CM

## 2020-05-04 DIAGNOSIS — Z13220 Encounter for screening for lipoid disorders: Secondary | ICD-10-CM

## 2020-05-04 DIAGNOSIS — D72829 Elevated white blood cell count, unspecified: Secondary | ICD-10-CM | POA: Diagnosis not present

## 2020-05-04 DIAGNOSIS — E042 Nontoxic multinodular goiter: Secondary | ICD-10-CM

## 2020-05-04 DIAGNOSIS — E559 Vitamin D deficiency, unspecified: Secondary | ICD-10-CM

## 2020-05-04 DIAGNOSIS — Z1389 Encounter for screening for other disorder: Secondary | ICD-10-CM | POA: Diagnosis not present

## 2020-05-04 DIAGNOSIS — Z0001 Encounter for general adult medical examination with abnormal findings: Secondary | ICD-10-CM

## 2020-05-04 DIAGNOSIS — L409 Psoriasis, unspecified: Secondary | ICD-10-CM

## 2020-05-04 DIAGNOSIS — E782 Mixed hyperlipidemia: Secondary | ICD-10-CM

## 2020-05-04 NOTE — Patient Instructions (Addendum)
Ms. Heather , Thank you for taking time to come for your Annual Wellness Visit. I appreciate your ongoing commitment to your health goals. Please review the following plan we discussed and let me know if I can assist you in the future.    This is a list of the screening recommended for you and due dates:  Health Maintenance  Topic Date Due  . Pap Smear  Never done  . Flu Shot  07/14/2020*  . COVID-19 Vaccine (4 - Booster for Pfizer series) 09/14/2020  . Mammogram  02/08/2022  . Cologuard (Stool DNA test)  06/17/2022  . Tetanus Vaccine  06/22/2025  .  Hepatitis C: One time screening is recommended by Center for Disease Control  (CDC) for  adults born from [birth year] through 1967.   Completed  . HIV Screening  Completed  *Topic was postponed. The date shown is not the original due date.     Shingrix - ask insurance about coverage - can get at [facility]  Please ask GYN to send last PAP report     Know what a healthy weight is for you (roughly BMI <25) and aim to maintain this  Aim for 7+ servings of fruits and vegetables daily  65-80+ fluid ounces of water or unsweet tea for healthy kidneys  Limit to max 1 drink of alcohol per day; avoid smoking/tobacco  Limit animal fats in diet for cholesterol and heart health - choose grass fed whenever available  Avoid highly processed foods, and foods high in saturated/trans fats  Aim for low stress - take time to unwind and care for your mental health  Aim for 150 min of moderate intensity exercise weekly for heart health, and weights twice weekly for bone health  Aim for 7-9 hours of sleep daily       High-Fiber Eating Plan Fiber, also called dietary fiber, is a type of carbohydrate. It is found foods such as fruits, vegetables, whole grains, and beans. A high-fiber diet can have many health benefits. Your health care provider may recommend a high-fiber diet to help:  Prevent constipation. Fiber can make your bowel movements more  regular.  Lower your cholesterol.  Relieve the following conditions: ? Inflammation of veins in the anus (hemorrhoids). ? Inflammation of specific areas of the digestive tract (uncomplicated diverticulosis). ? A problem of the large intestine, also called the colon, that sometimes causes pain and diarrhea (irritable bowel syndrome, or IBS).  Prevent overeating as part of a weight-loss plan.  Prevent heart disease, type 2 diabetes, and certain cancers. What are tips for following this plan? Reading food labels  Check the nutrition facts label on food products for the amount of dietary fiber. Choose foods that have 5 grams of fiber or more per serving.  The goals for recommended daily fiber intake include: ? Men (age 28 or younger): 34-38 g. ? Men (over age 23): 28-34 g. ? Women (age 52 or younger): 25-28 g. ? Women (over age 39): 22-25 g. Your daily fiber goal is _____________ g.   Shopping  Choose whole fruits and vegetables instead of processed forms, such as apple juice or applesauce.  Choose a wide variety of high-fiber foods such as avocados, lentils, oats, and kidney beans.  Read the nutrition facts label of the foods you choose. Be aware of foods with added fiber. These foods often have high sugar and sodium amounts per serving. Cooking  Use whole-grain flour for baking and cooking.  Cook with brown rice instead of  white rice. Meal planning  Start the day with a breakfast that is high in fiber, such as a cereal that contains 5 g of fiber or more per serving.  Eat breads and cereals that are made with whole-grain flour instead of refined flour or white flour.  Eat brown rice, bulgur wheat, or millet instead of white rice.  Use beans in place of meat in soups, salads, and pasta dishes.  Be sure that half of the grains you eat each day are whole grains. General information  You can get the recommended daily intake of dietary fiber by: ? Eating a variety of fruits,  vegetables, grains, nuts, and beans. ? Taking a fiber supplement if you are not able to take in enough fiber in your diet. It is better to get fiber through food than from a supplement.  Gradually increase how much fiber you consume. If you increase your intake of dietary fiber too quickly, you may have bloating, cramping, or gas.  Drink plenty of water to help you digest fiber.  Choose high-fiber snacks, such as berries, raw vegetables, nuts, and popcorn. What foods should I eat? Fruits Berries. Pears. Apples. Oranges. Avocado. Prunes and raisins. Dried figs. Vegetables Sweet potatoes. Spinach. Kale. Artichokes. Cabbage. Broccoli. Cauliflower. Green peas. Carrots. Squash. Grains Whole-grain breads. Multigrain cereal. Oats and oatmeal. Brown rice. Barley. Bulgur wheat. [place]. Quinoa. Bran muffins. Popcorn. Rye wafer crackers. Meats and other proteins Navy beans, kidney beans, and pinto beans. Soybeans. Split peas. Lentils. Nuts and seeds. Dairy Fiber-fortified yogurt. Beverages Fiber-fortified soy milk. Fiber-fortified orange juice. Other foods Fiber bars. The items listed above may not be a complete list of recommended foods and beverages. Contact a dietitian for more information. What foods should I avoid? Fruits Fruit juice. Cooked, strained fruit. Vegetables Fried potatoes. Canned vegetables. Well-cooked vegetables. Grains White bread. Pasta made with refined flour. White rice. Meats and other proteins Fatty cuts of meat. Fried chicken or fried fish. Dairy Milk. Yogurt. Cream cheese. Sour cream. Fats and oils Butters. Beverages Soft drinks. Other foods Cakes and pastries. The items listed above may not be a complete list of foods and beverages to avoid. Talk with your dietitian about what choices are best for you. Summary  Fiber is a type of carbohydrate. It is found in foods such as fruits, vegetables, whole grains, and beans.  A high-fiber diet has many benefits.  It can help to prevent constipation, lower blood cholesterol, aid weight loss, and reduce your risk of heart disease, diabetes, and certain cancers.  Increase your intake of fiber gradually. Increasing fiber too quickly may cause cramping, bloating, and gas. Drink plenty of water while you increase the amount of fiber you consume.  The best sources of fiber include whole fruits and vegetables, whole grains, nuts, seeds, and beans. This information is not intended to replace advice given to you by your health care provider. Make sure you discuss any questions you have with your health care provider. Document Revised: 08/06/2019 Document Reviewed: 08/06/2019 Elsevier Patient Education  2021 [employer].

## 2020-05-04 NOTE — Progress Notes (Signed)
Complete Physical  Assessment and Plan:  Monica Morgan was seen today for annual exam.  Diagnoses and all orders for this visit:  Encounter for routine adult health examination with abnormal findings Due annually  Schedule derm follow up  Essential hypertension Continue medications Monitor blood pressure at home; call if consistently over 130/80 Continue DASH diet.   Reminder to go to the ER if any CP, SOB, nausea, dizziness, severe HA, changes vision/speech, left arm numbness and tingling and jaw pain. -     CBC with Differential/Platelet -     COMPLETE METABOLIC PANEL WITH GFR -     Magnesium -     TSH -     Microalbumin / creatinine urine ratio -     Urinalysis, Routine w reflex microscopic  Persistent atrial fibrillation (HCC) Rate controlled; cardiology following; continue elequis   Atrial flutter with rapid ventricular response ([place]) Rate controlled; cardiology is following  Hypothyroidism, unspecified type continue medications the same pending lab results reminded to take on an empty stomach 30-42mins before food.  -     TSH  Multinodular thyroid Normal biopsy; no further follow up recommended  Psoriasis of scalp Treat PRN; avoid triggers; has seen rheum  Morbid obesity ([place]) Long discussion about weight loss, diet, and exercise Recommended diet heavy in fruits and veggies and low in animal meats, cheeses, and dairy products, appropriate calorie intake Patient will work on  Discussed appropriate weight for height  Follow up at next visit  Hyperlipidemia, mixed Mild elevations working on lifestyle Continue low cholesterol diet and exercise.  Check lipid panel.  -     Lipid panel -     TSH  Vitamin D deficiency Continue supplement; check annually  Defer as recently checked;   Medication management -     CBC with Differential/Platelet -     COMPLETE METABOLIC PANEL WITH GFR -     Magnesium  Abnormal glucose Discussed disease and risks Discussed  diet/exercise, weight management  -     Hemoglobin A1c  Screening for diabetes mellitus -     Hemoglobin A1c  Screening for hematuria or proteinuria -     Microalbumin / creatinine urine ratio -     Urinalysis, Routine w reflex microscopic  Leukocytosis, unspecified type Persistent/stable predating 2017; check smear after disucssions -     Pathologist smear review  History of malignant melanoma Encouraged close follow up with derm No areas of concern today     Discussed med's effects and SE's. Screening labs and tests as requested with regular follow-up as recommended. Over 40 minutes of exam, counseling, chart review, and complex, high level critical decision making was performed this visit.   Future Appointments  Date Time Provider [place]  06/27/2020  3:00 PM Glendale Chard, MD [facility] None  09/05/2020  3:30 PM Liane Comber, NP [facility] None  01/11/2021  4:00 PM Unk Pinto, MD [facility] None  05/04/2021  3:00 PM Liane Comber, NP [facility] None     HPI  60 y.o. female  presents for a complete physical and follow up for has Hyperlipidemia, mixed; Vitamin D deficiency; Medication management; Hypothyroidism; Abnormal glucose; Morbid obesity ([place]); Atrial flutter with rapid ventricular response ([place]); Persistent atrial fibrillation ([place]); Psoriasis of scalp; Essential hypertension; Multinodular thyroid; and History of malignant melanoma on their problem list..  She is happily divorced, has a good long term relationship of 15+ years, she has 2 grown children, 5 grandchildren. 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   Follows with [facility] Dr. Lanny Cramp, recently underwent hysteroscopy for post menopausal spotting with inadequate sample , planning for [place] 1/20 with follow up to discuss further recommendations in March 2022.   She has hx of multinodular thyroid goiter, recently left inferior nodule  showed growth and underwent FNA on 03/08/2020 which resulted bethesda II. No other nodules were felt concerning and no further follow up was recommended by radiology.   Follows with chiropractor for mid back strain, knee and hip pain, improving with adjustments. Likes to walk in pool when weather permits.   She has hx of psoriasis of scalp, has seen rheum, doing topicals PRN only.   BMI is Body mass index is 38.67 kg/m., she has been working on diet and exercise, trying to eat the right things, watching portions. Poor candidate phentermine due to heart, wellbutrin caused very dry mouth without significant benefit. Would consider wegovy but not at this time.  Wt Readings from Last 3 Encounters:  05/04/20 232 lb 6.4 oz (105.4 kg)  03/15/20 236 lb (107 kg)  02/12/20 234 lb 12.8 oz (106.5 kg)   She has a. Fib and hx of flutter with RVR, had ablations in 2017/2018 by Dr Curt Bears.  She remains on Eliquis for CHA2DsVASc 2. Taking diltiazem 120 mg daily and flecainide.   Her blood pressure has been controlled at home, today their BP is BP: 120/86 She does not workout, but reports fairly active around the home, but does plan to walk in pool once warmer. She denies chest pain, shortness of breath, dizziness.   She is not on cholesterol medication and denies myalgias. Her cholesterol is not at goal, working on lifestyle. The cholesterol last visit was:   Lab Results  Component Value Date   CHOL 183 01/27/2020   HDL 43 (L) 01/27/2020   LDLCALC 108 (H) 01/27/2020   TRIG 196 (H) 01/27/2020   CHOLHDL 4.3 01/27/2020   She has been working on diet and exercise for prediabetes and denies nausea, paresthesia of the feet, polydipsia, polyuria, visual disturbances and vomiting. Last A1C in the office was:  Lab Results  Component Value Date   HGBA1C 5.8 (H) 01/27/2020    Last GFR: Lab Results  Component Value Date   GFRNONAA 68 01/27/2020   She is on thyroid medication. Her medication was not changed  last visit.  50 mcg daily.  Lab Results  Component Value Date   TSH 3.40 01/27/2020   Patient is on Vitamin D supplement.   Lab Results  Component Value Date   VD25OH 83 01/27/2020      She has persistent but stable leukocytosis ongoing since 2017, hasn't had a path review CBC Latest Ref Rng & Units 01/27/2020 10/22/2019 07/21/2019  WBC 3.8 - 10.8 Thousand/uL 11.5(H) 10.9(H) 12.4(H)  Hemoglobin 11.7 - 15.5 g/dL 13.3 13.7 14.4  Hematocrit 35.0 - 45.0 % 40.5 41.8 43.6  Platelets 140 - 400 Thousand/uL 311 326 308   Lab Results  Component Value Date   IRON 71 10/22/2019   TIBC 325 10/22/2019   FERRITIN 107 10/22/2019   Lab Results  Component Value Date   VITAMINB12 >2,000 (H) 04/14/2019     Current Medications:  Current Outpatient Medications on File Prior to Visit  Medication Sig Dispense Refill  . Ascorbic Acid (VITAMIN C) 500 MG CHEW Chew by mouth. 2 times per day     . augmented betamethasone dipropionate (DIPROLENE-AF) 0.05 % cream APPLY VERY  SPARINGLY TO PSORIASIS RASH (DO NOT USE ON FACE ! ) 50 g 5  . Black Pepper-Turmeric 3-500 MG CAPS Take by mouth.    . Calcium 600-400 MG-UNIT CHEW Chew 1 each by mouth 2 (two) times daily.     . Cholecalciferol (VITAMIN D PO) Take 10,000 Units by mouth daily.     . Coenzyme Q10 (CO Q10) 100 MG CAPS Take 100 mg by mouth daily.     . cyclobenzaprine (FLEXERIL) 5 MG tablet Take 1 tablet (5 mg total) by mouth 3 (three) times daily as needed for muscle spasms. 60 tablet 0  . diltiazem (CARDIZEM CD) 120 MG 24 hr capsule TAKE 1 CAPSULE BY MOUTH EVERY DAY 90 capsule 3  . ELIQUIS 5 MG TABS tablet TAKE 1 TABLET BY MOUTH TWICE A DAY 60 tablet 5  . ezetimibe (ZETIA) 10 MG tablet TAKE 1 TABLET DAILY FOR CHOLESTREROL 90 tablet 3  . flecainide (TAMBOCOR) 100 MG tablet TAKE 1 TABLET BY MOUTH TWICE A DAY 180 tablet 2  . furosemide (LASIX) 20 MG tablet Take 1 tablet Daily for Fluid Retention 90 tablet 1  . levothyroxine (SYNTHROID) 50 MCG tablet TAKE 1  TABLET DAILY ON EMPTY STOMACH WITH ONLY WATER FOR 30 MIN (NO ANTACID MEDS, CALCIUM OR MAGNESIUM FOR 4 HOURS & AVOID BIOTIN) 90 tablet 3  . Magnesium 500 MG TABS Take 500 mg by mouth every evening.     . montelukast (SINGULAIR) 10 MG tablet Take      1 tablet      Daily       for Allergies 90 tablet 0  . Multiple Vitamin (MULTIVITAMIN WITH MINERALS) TABS tablet Take 1 tablet by mouth daily.    . NONFORMULARY OR COMPOUNDED ITEM Apply 1 each topically See admin instructions. Progesterone 8%/84ml Apply 0.53mls topically every day except Saturday.    Marland Kitchen OVER THE COUNTER MEDICATION in the morning and at bedtime. Taking Ferrous Sulfate 325 mg daily.    Vladimir Faster Glycol-Propyl Glycol (SYSTANE OP) Place 1 drop into both eyes daily.    . Probiotic Product (PROBIOTIC DAILY PO) Take 1 tablet by mouth daily.      No current facility-administered medications on file prior to visit.   Allergies:  Allergies  Allergen Reactions  . Lipitor [Atorvastatin] Other (See Comments)    "neck pain"  . Metoprolol Other (See Comments)    DIDN'T WORK FOR PATIENT AND GAINED WEIGHT MIGHT HAVE BEEN TAKEN WITH RYTHMOL  . Phentermine Other (See Comments)    Did not help, Insomnia   . Rythmol [Propafenone] Other (See Comments)    "weight gain"  . Sulfa Drugs Cross Reactors Other (See Comments)    "soreness all over"  . Lisinopril Cough   Medical History:  She has Hyperlipidemia, mixed; Vitamin D deficiency; Medication management; Hypothyroidism; Abnormal glucose; Morbid obesity ([place]); Atrial flutter with rapid ventricular response ([place]); Persistent atrial fibrillation ([place]); Psoriasis of scalp; Essential hypertension; Multinodular thyroid; and History of malignant melanoma on their problem list. Health Maintenance:   Immunization History  Administered Date(s) Administered  . PFIZER(Purple Top)SARS-COV-2 Vaccination 06/29/2019, 07/20/2019, 03/16/2020  . PPD Test 12/07/2013, 12/22/2014, 08/17/2016, 11/27/2017,  04/14/2019  . Tdap 06/23/2015    Tetanus: 2017 Pneumovax: n/a Prevnar 13: age 25 Flu vaccine: declines Shingrix: discussed, check with insurance  Covid 19: 3/3, 2021, pfizer   LMP: Patient's last menstrual period was 10/29/2013 (approximate). Pap: getting at GYN, Dr. Lanny Cramp MGM: 02/09/2020 DEXA: normal 2014  Colonoscopy: never Cologuard: 06/17/2019 normal  Last Dental Exam: few years, will schedule this year Last Eye Exam: 04/2020, [facility], no concerns, reading glasses Last derm: a few years back, plans to schedule this year, remote hx of R leg melanoma removal at age 1  Patient Care Team: Unk Pinto, MD as PCP - General (Internal Medicine) Constance Haw, MD as PCP - Cardiology (Cardiology) Constance Haw, MD as PCP - Electrophysiology (Cardiology)  Surgical History:  She has a past surgical history that includes Melanoma excision (Right, 10/1975); Cesarean section with bilateral tubal ligation (1982); Cesarean section (1980); TEE without cardioversion (N/A, 10/25/2015); Cardioversion (N/A, 10/25/2015); Tonsillectomy (1967); TEE without cardioversion (N/A, 01/11/2016); Cardiac catheterization (N/A, 01/13/2016); A-FLUTTER ABLATION (N/A, 02/08/2017); ATRIAL FIBRILLATION ABLATION (N/A, 04/11/2017); Refractive surgery (Bilateral, 1990s); Tubal ligation; Endometrial ablation (~ 2012); and Hysteroscopy (03/28/2020). Family History:  Herfamily history includes Diabetes in her father; Heart attack in her brother and mother; Heart disease in her father and mother; Hypertension in her father and mother. Social History:  She reports that she has never smoked. She has never used smokeless tobacco. She reports current alcohol use. She reports that she does not use drugs.  Review of Systems: Review of Systems  Constitutional: Negative for malaise/fatigue and weight loss.  HENT: Negative for hearing loss and tinnitus.   Eyes: Negative for blurred vision and double vision.   Respiratory: Negative for cough, shortness of breath and wheezing.   Cardiovascular: Negative for chest pain, palpitations, orthopnea, claudication and leg swelling.  Gastrointestinal: Negative for abdominal pain, blood in stool, constipation, diarrhea, heartburn, melena, nausea and vomiting.  Genitourinary: Negative.   Musculoskeletal: Positive for joint pain (intermittent bil knees, hips, improved recent). Negative for myalgias.  Skin: Negative for rash.  Neurological: Negative for dizziness, tingling, sensory change, weakness and headaches.  Endo/Heme/Allergies: Negative for polydipsia.  Psychiatric/Behavioral: Negative.   All other systems reviewed and are negative.   Physical Exam: Estimated body mass index is 38.67 kg/m as calculated from the following:   Height as of this encounter: 5\' 5"  (1.651 m).   Weight as of this encounter: 232 lb 6.4 oz (105.4 kg). BP 120/86   Pulse 64   Temp (!) 97.2 F (36.2 C)   Ht 5\' 5"  (1.651 m)   Wt 232 lb 6.4 oz (105.4 kg)   LMP 10/29/2013 (Approximate)   SpO2 98%   BMI 38.67 kg/m  General Appearance: Well nourished, in no apparent distress.  Eyes: PERRLA, EOMs, conjunctiva no swelling or erythema Sinuses: No Frontal/maxillary tenderness  ENT/Mouth: Ext aud canals clear, normal light reflex with TMs without erythema, bulging. Good dentition. No erythema, swelling, or exudate on post pharynx. Tonsils not swollen or erythematous. Hearing normal.  Neck: Supple, thyroid normal. No bruits  Respiratory: Respiratory effort normal, BS equal bilaterally without rales, rhonchi, wheezing or stridor.  Cardio: RRR without murmurs, rubs or gallops. Brisk peripheral pulses without edema.  Chest: symmetric, with normal excursions and percussion.  Breasts: defer to GYN Abdomen: Soft, obese abdomen, nontender, no guarding, rebound, hernias, masses, or organomegaly.  Lymphatics: Non tender without lymphadenopathy.  Genitourinary: defer to  GYN Musculoskeletal: Full ROM all peripheral extremities,5/5 strength, and normal gait.  Skin: Warm, dry without rashes, lesions, ecchymosis. Neuro: Cranial nerves intact, reflexes equal bilaterally. Normal muscle tone, no cerebellar symptoms. Sensation intact.  Psych: Awake and oriented X 3, normal affect, Insight and Judgment appropriate.   EKG: cardiology is obtaining - NSR from 02/12/2020 reviewed, defer today after discussion  Monica Morgan 5:29 PM [facility]

## 2020-05-05 DIAGNOSIS — N950 Postmenopausal bleeding: Secondary | ICD-10-CM | POA: Diagnosis not present

## 2020-05-05 LAB — COMPLETE METABOLIC PANEL WITH GFR
AG Ratio: 2 (calc) (ref 1.0–2.5)
ALT: 16 U/L (ref 6–29)
AST: 15 U/L (ref 10–35)
Albumin: 4.8 g/dL (ref 3.6–5.1)
Alkaline phosphatase (APISO): 73 U/L (ref 37–153)
BUN: 21 mg/dL (ref 7–25)
CO2: 27 mmol/L (ref 20–32)
Calcium: 10.3 mg/dL (ref 8.6–10.4)
Chloride: 101 mmol/L (ref 98–110)
Creat: 0.86 mg/dL (ref 0.50–0.99)
GFR, Est African American: 85 mL/min/{1.73_m2} (ref >=60)
GFR, Est Non African American: 73 mL/min/{1.73_m2} (ref >=60)
Globulin: 2.4 g/dL (calc) (ref 1.9–3.7)
Glucose, Bld: 86 mg/dL (ref 65–99)
Potassium: 4.8 mmol/L (ref 3.5–5.3)
Sodium: 139 mmol/L (ref 135–146)
Total Bilirubin: 0.4 mg/dL (ref 0.2–1.2)
Total Protein: 7.2 g/dL (ref 6.1–8.1)

## 2020-05-05 LAB — LIPID PANEL
Cholesterol: 181 mg/dL (ref <200)
HDL: 48 mg/dL — ABNORMAL LOW (ref >=50)
LDL Cholesterol (Calc): 102 mg/dL (calc) — ABNORMAL HIGH
Non-HDL Cholesterol (Calc): 133 mg/dL (calc) — ABNORMAL HIGH (ref <130)
Total CHOL/HDL Ratio: 3.8 (calc) (ref <5.0)
Triglycerides: 191 mg/dL — ABNORMAL HIGH (ref <150)

## 2020-05-05 LAB — CBC WITH DIFFERENTIAL/PLATELET
Absolute Monocytes: 854 cells/uL (ref 200–950)
Basophils Absolute: 85 cells/uL (ref 0–200)
Basophils Relative: 0.7 %
Eosinophils Absolute: 171 cells/uL (ref 15–500)
Eosinophils Relative: 1.4 %
HCT: 40.6 % (ref 35.0–45.0)
Hemoglobin: 13.6 g/dL (ref 11.7–15.5)
Lymphs Abs: 3770 cells/uL (ref 850–3900)
MCH: 28.9 pg (ref 27.0–33.0)
MCHC: 33.5 g/dL (ref 32.0–36.0)
MCV: 86.4 fL (ref 80.0–100.0)
MPV: 10.6 fL (ref 7.5–12.5)
Monocytes Relative: 7 %
Neutro Abs: 7320 cells/uL (ref 1500–7800)
Neutrophils Relative %: 60 %
Platelets: 337 10*3/uL (ref 140–400)
RBC: 4.7 10*6/uL (ref 3.80–5.10)
RDW: 12.5 % (ref 11.0–15.0)
Total Lymphocyte: 30.9 %
WBC: 12.2 10*3/uL — ABNORMAL HIGH (ref 3.8–10.8)

## 2020-05-05 LAB — TSH: TSH: 2.32 mIU/L (ref 0.40–4.50)

## 2020-05-05 LAB — URINALYSIS, ROUTINE W REFLEX MICROSCOPIC
Bacteria, UA: NONE SEEN /HPF
Bilirubin Urine: NEGATIVE
Glucose, UA: NEGATIVE
Hgb urine dipstick: NEGATIVE
Hyaline Cast: NONE SEEN /LPF
Ketones, ur: NEGATIVE
Nitrite: NEGATIVE
Protein, ur: NEGATIVE
RBC / HPF: NONE SEEN /HPF (ref 0–2)
Specific Gravity, Urine: 1.013 (ref 1.001–1.03)
pH: 6 (ref 5.0–8.0)

## 2020-05-05 LAB — MICROALBUMIN / CREATININE URINE RATIO
Creatinine, Urine: 70 mg/dL (ref 20–275)
Microalb Creat Ratio: 4 mcg/mg creat (ref <30)
Microalb, Ur: 0.3 mg/dL

## 2020-05-05 LAB — MAGNESIUM: Magnesium: 2.5 mg/dL (ref 1.5–2.5)

## 2020-05-05 LAB — PATHOLOGIST SMEAR REVIEW

## 2020-05-05 LAB — HEMOGLOBIN A1C
Hgb A1c MFr Bld: 5.7 % of total Hgb — ABNORMAL HIGH (ref <5.7)
Mean Plasma Glucose: 117 mg/dL
eAG (mmol/L): 6.5 mmol/L

## 2020-05-10 ENCOUNTER — Other Ambulatory Visit: Payer: Self-pay | Admitting: Internal Medicine

## 2020-05-11 DIAGNOSIS — M62830 Muscle spasm of back: Secondary | ICD-10-CM | POA: Diagnosis not present

## 2020-05-11 DIAGNOSIS — M9903 Segmental and somatic dysfunction of lumbar region: Secondary | ICD-10-CM | POA: Diagnosis not present

## 2020-05-11 DIAGNOSIS — M9906 Segmental and somatic dysfunction of lower extremity: Secondary | ICD-10-CM | POA: Diagnosis not present

## 2020-05-11 DIAGNOSIS — M9901 Segmental and somatic dysfunction of cervical region: Secondary | ICD-10-CM | POA: Diagnosis not present

## 2020-05-11 DIAGNOSIS — M9902 Segmental and somatic dysfunction of thoracic region: Secondary | ICD-10-CM | POA: Diagnosis not present

## 2020-05-12 ENCOUNTER — Ambulatory Visit: Payer: BC Managed Care – PPO | Admitting: Cardiology

## 2020-05-13 ENCOUNTER — Other Ambulatory Visit: Payer: Self-pay | Admitting: Cardiology

## 2020-05-13 NOTE — Telephone Encounter (Signed)
Pt's age 60, wt 105.4  kg, SCr 0.86, CrCl 114.3, last ov w/ MT 02/12/20.

## 2020-05-14 ENCOUNTER — Other Ambulatory Visit: Payer: Self-pay | Admitting: Internal Medicine

## 2020-05-25 DIAGNOSIS — M62830 Muscle spasm of back: Secondary | ICD-10-CM | POA: Diagnosis not present

## 2020-05-25 DIAGNOSIS — M9903 Segmental and somatic dysfunction of lumbar region: Secondary | ICD-10-CM | POA: Diagnosis not present

## 2020-05-25 DIAGNOSIS — M9906 Segmental and somatic dysfunction of lower extremity: Secondary | ICD-10-CM | POA: Diagnosis not present

## 2020-05-25 DIAGNOSIS — M9902 Segmental and somatic dysfunction of thoracic region: Secondary | ICD-10-CM | POA: Diagnosis not present

## 2020-05-25 DIAGNOSIS — M9901 Segmental and somatic dysfunction of cervical region: Secondary | ICD-10-CM | POA: Diagnosis not present

## 2020-06-08 DIAGNOSIS — M9902 Segmental and somatic dysfunction of thoracic region: Secondary | ICD-10-CM | POA: Diagnosis not present

## 2020-06-08 DIAGNOSIS — M9903 Segmental and somatic dysfunction of lumbar region: Secondary | ICD-10-CM | POA: Diagnosis not present

## 2020-06-08 DIAGNOSIS — M9906 Segmental and somatic dysfunction of lower extremity: Secondary | ICD-10-CM | POA: Diagnosis not present

## 2020-06-08 DIAGNOSIS — M62830 Muscle spasm of back: Secondary | ICD-10-CM | POA: Diagnosis not present

## 2020-06-08 DIAGNOSIS — M9901 Segmental and somatic dysfunction of cervical region: Secondary | ICD-10-CM | POA: Diagnosis not present

## 2020-06-20 DIAGNOSIS — M9902 Segmental and somatic dysfunction of thoracic region: Secondary | ICD-10-CM | POA: Diagnosis not present

## 2020-06-20 DIAGNOSIS — M9906 Segmental and somatic dysfunction of lower extremity: Secondary | ICD-10-CM | POA: Diagnosis not present

## 2020-06-20 DIAGNOSIS — M9903 Segmental and somatic dysfunction of lumbar region: Secondary | ICD-10-CM | POA: Diagnosis not present

## 2020-06-20 DIAGNOSIS — M9901 Segmental and somatic dysfunction of cervical region: Secondary | ICD-10-CM | POA: Diagnosis not present

## 2020-06-20 DIAGNOSIS — M62830 Muscle spasm of back: Secondary | ICD-10-CM | POA: Diagnosis not present

## 2020-06-22 DIAGNOSIS — M9903 Segmental and somatic dysfunction of lumbar region: Secondary | ICD-10-CM | POA: Diagnosis not present

## 2020-06-22 DIAGNOSIS — M9902 Segmental and somatic dysfunction of thoracic region: Secondary | ICD-10-CM | POA: Diagnosis not present

## 2020-06-22 DIAGNOSIS — M9901 Segmental and somatic dysfunction of cervical region: Secondary | ICD-10-CM | POA: Diagnosis not present

## 2020-06-22 DIAGNOSIS — M62830 Muscle spasm of back: Secondary | ICD-10-CM | POA: Diagnosis not present

## 2020-06-22 DIAGNOSIS — M9906 Segmental and somatic dysfunction of lower extremity: Secondary | ICD-10-CM | POA: Diagnosis not present

## 2020-06-27 ENCOUNTER — Other Ambulatory Visit: Payer: Self-pay

## 2020-06-27 ENCOUNTER — Ambulatory Visit (INDEPENDENT_AMBULATORY_CARE_PROVIDER_SITE_OTHER): Payer: BC Managed Care – PPO | Admitting: Internal Medicine

## 2020-06-27 ENCOUNTER — Encounter: Payer: Self-pay | Admitting: Internal Medicine

## 2020-06-27 VITALS — BP 124/78 | HR 68 | Temp 98.3°F | Ht 65.0 in | Wt 236.2 lb

## 2020-06-27 DIAGNOSIS — Z8742 Personal history of other diseases of the female genital tract: Secondary | ICD-10-CM

## 2020-06-27 DIAGNOSIS — Z6839 Body mass index (BMI) 39.0-39.9, adult: Secondary | ICD-10-CM | POA: Diagnosis not present

## 2020-06-27 DIAGNOSIS — N951 Menopausal and female climacteric states: Secondary | ICD-10-CM | POA: Diagnosis not present

## 2020-06-27 NOTE — Progress Notes (Signed)
I,Katawbba Wiggins,acting as a Education administrator for Maximino Greenland, MD.,have documented all relevant documentation on the behalf of Maximino Greenland, MD,as directed by  Maximino Greenland, MD while in the presence of Maximino Greenland, MD.   This visit occurred during the SARS-CoV-2 public health emergency.  Safety protocols were in place, including screening questions prior to the visit, additional usage of staff PPE, and extensive cleaning of exam room while observing appropriate contact time as indicated for disinfecting solutions.  Subjective:     Patient ID: Monica Morgan , female    DOB: 1960-08-07 , 60 y.o.   MRN: [id number]   Chief Complaint  Patient presents with  . hormones f/u    HPI  She presents today for f/u BHRT. She has noticed that her hot flashes are returning. She is no longer on vaginal estriol due to postmenopausal bleeding. She had full workup with GYN which was negative. Now, only taking progesterone. Would like to switch to conventional HRT if possible due to cost, her insurance is not covering HRT. She has not yet discussed this with her GYN.     Past Medical History:  Diagnosis Date  . Arthritis    "probably in my knees; maybe in my hands" (04/11/2017)  . Asthma attack 10/2015 X 1  . Atrial flutter ([place]) 10/2015  . Heart murmur    "comes and goes" (04/11/2017)  . Hepatitis 1972   "don't know what kind"  . High cholesterol   . Hypertension   . Hypothyroidism   . Malignant melanoma ([place]) 10/1975   R leg as a teen  . Malignant melanoma of leg ([place])    "right thigh"  . PAF (paroxysmal atrial fibrillation) ([place])   . Pneumonia 1966; 1967   "left lung collapsed one of these times"  . Psoriasis   . Rosacea   . Vitamin D deficiency      Family History  Problem Relation Age of Onset  . Heart disease Mother   . Hypertension Mother   . Heart attack Mother   . Heart disease Father   . Diabetes Father   . Hypertension Father   . Heart attack Brother   . Colon cancer  Neg Hx   . Stroke Neg Hx      Current Outpatient Medications:  .  Ascorbic Acid (VITAMIN C) 500 MG CHEW, Chew by mouth. 2 times per day , Disp: , Rfl:  .  augmented betamethasone dipropionate (DIPROLENE-AF) 0.05 % cream, APPLY VERY SPARINGLY TO PSORIASIS RASH (DO NOT USE ON FACE ! ), Disp: 50 g, Rfl: 5 .  Black Pepper-Turmeric 3-500 MG CAPS, Take by mouth., Disp: , Rfl:  .  Calcium 600-400 MG-UNIT CHEW, Chew 1 each by mouth 2 (two) times daily. , Disp: , Rfl:  .  Cholecalciferol (VITAMIN D PO), Take 10,000 Units by mouth daily. , Disp: , Rfl:  .  Coenzyme Q10 (CO Q10) 100 MG CAPS, Take 100 mg by mouth daily. , Disp: , Rfl:  .  diltiazem (CARDIZEM CD) 120 MG 24 hr capsule, TAKE 1 CAPSULE BY MOUTH EVERY DAY, Disp: 90 capsule, Rfl: 3 .  ELIQUIS 5 MG TABS tablet, TAKE 1 TABLET BY MOUTH TWICE A DAY, Disp: 60 tablet, Rfl: 5 .  ezetimibe (ZETIA) 10 MG tablet, TAKE 1 TABLET DAILY FOR CHOLESTREROL, Disp: 90 tablet, Rfl: 3 .  flecainide (TAMBOCOR) 100 MG tablet, TAKE 1 TABLET BY MOUTH TWICE A DAY, Disp: 180 tablet, Rfl: 2 .  furosemide (LASIX) 20  MG tablet, Take 1 tablet Daily for Fluid Retention, Disp: 90 tablet, Rfl: 1 .  levothyroxine (SYNTHROID) 50 MCG tablet, TAKE 1 TABLET DAILY ON EMPTY STOMACH WITH ONLY WATER FOR 30 MIN (NO ANTACID MEDS, CALCIUM OR MAGNESIUM FOR 4 HOURS & AVOID BIOTIN), Disp: 90 tablet, Rfl: 3 .  Magnesium 500 MG TABS, Take 500 mg by mouth every evening. , Disp: , Rfl:  .  montelukast (SINGULAIR) 10 MG tablet, Take  1 tablet  Daily for Allergies, Disp: 90 tablet, Rfl: 0 .  Multiple Vitamin (MULTIVITAMIN WITH MINERALS) TABS tablet, Take 1 tablet by mouth daily., Disp: , Rfl:  .  NONFORMULARY OR COMPOUNDED ITEM, Apply 1 each topically See admin instructions. Progesterone 8%/59ml Apply 0.38mls topically every day except Saturday., Disp: , Rfl:  .  OVER THE COUNTER MEDICATION, in the morning and at bedtime. Taking Ferrous Sulfate 325 mg daily., Disp: , Rfl:  .  Polyethyl Glycol-Propyl  Glycol (SYSTANE OP), Place 1 drop into both eyes daily., Disp: , Rfl:  .  Probiotic Product (PROBIOTIC DAILY PO), Take 1 tablet by mouth daily. , Disp: , Rfl:  .  cyclobenzaprine (FLEXERIL) 5 MG tablet, TAKE 1 TABLET BY MOUTH THREE TIMES A DAY AS NEEDED FOR MUSCLE SPASMS, Disp: 60 tablet, Rfl: 0 .  progesterone (PROMETRIUM) 100 MG capsule, Take 1 capsule (100 mg total) by mouth daily., Disp: 30 capsule, Rfl: 1   Allergies  Allergen Reactions  . Lipitor [Atorvastatin] Other (See Comments)    "neck pain"  . Metoprolol Other (See Comments)    DIDN'T WORK FOR PATIENT AND GAINED WEIGHT MIGHT HAVE BEEN TAKEN WITH RYTHMOL  . Phentermine Other (See Comments)    Did not help, Insomnia   . Rythmol [Propafenone] Other (See Comments)    "weight gain"  . Sulfa Drugs Cross Reactors Other (See Comments)    "soreness all over"  . Lisinopril Cough     Review of Systems  Constitutional: Negative.   Respiratory: Negative.   Cardiovascular: Negative.   Gastrointestinal: Negative.   Psychiatric/Behavioral: Negative.   All other systems reviewed and are negative.    Today's Vitals   06/27/20 1520  BP: 124/78  Pulse: 68  Temp: 98.3 F (36.8 C)  TempSrc: Oral  Weight: 236 lb 3.2 oz (107.1 kg)  Height: 5\' 5"  (1.651 m)   Body mass index is 39.31 kg/m.  Wt Readings from Last 3 Encounters:  06/27/20 236 lb 3.2 oz (107.1 kg)  05/04/20 232 lb 6.4 oz (105.4 kg)  03/15/20 236 lb (107 kg)   Objective:  Physical Exam Vitals and nursing note reviewed.  Constitutional:      Appearance: Normal appearance. She is obese.  HENT:     Head: Normocephalic and atraumatic.     Nose:     Comments: Masked     Mouth/Throat:     Comments: Masked  Cardiovascular:     Rate and Rhythm: Normal rate and regular rhythm.     Heart sounds: Normal heart sounds.  Pulmonary:     Effort: Pulmonary effort is normal.     Breath sounds: Normal breath sounds.  Musculoskeletal:     Cervical back: Normal range of  motion.  Skin:    General: Skin is warm.  Neurological:     General: No focal deficit present.     Mental Status: She is alert and oriented to person, place, and time.         Assessment And Plan:     1. Female  climacteric state Comments: I will speak to her GYN, Dr. Lanny Cramp about possibly switching to Prometrium supplementation. Increase in cruciferous veggie intake could address estrogen dominance.  2. History of postmenopausal bleeding Comments: Full workup has been performed, negative.   3. Class 2 severe obesity due to excess calories with serious comorbidity and body mass index (BMI) of 39.0 to 39.9 in adult [facility])  She is encouraged to strive for BMI less than 30 to decrease cardiac risk. Advised to aim for at least 150 minutes of exercise per week.    Patient was given opportunity to ask questions. Patient verbalized understanding of the plan and was able to repeat key elements of the plan. All questions were answered to their satisfaction.   I, Maximino Greenland, MD, have reviewed all documentation for this visit. The documentation on 07/15/20 for the exam, diagnosis, procedures, and orders are all accurate and complete.   IF YOU HAVE BEEN REFERRED TO A SPECIALIST, IT MAY TAKE 1-2 WEEKS TO SCHEDULE/PROCESS THE REFERRAL. IF YOU HAVE NOT HEARD FROM US/SPECIALIST IN TWO WEEKS, PLEASE GIVE [place] A CALL AT [phone].   THE PATIENT IS ENCOURAGED TO PRACTICE SOCIAL DISTANCING DUE TO THE COVID-19 PANDEMIC.   relevant documentation on the behalf of Maximino Greenland, MD,as directed by  Maximino Greenland, MD while in the presence of Maximino Greenland, MD.

## 2020-06-29 DIAGNOSIS — M9903 Segmental and somatic dysfunction of lumbar region: Secondary | ICD-10-CM | POA: Diagnosis not present

## 2020-06-29 DIAGNOSIS — M9902 Segmental and somatic dysfunction of thoracic region: Secondary | ICD-10-CM | POA: Diagnosis not present

## 2020-06-29 DIAGNOSIS — M62830 Muscle spasm of back: Secondary | ICD-10-CM | POA: Diagnosis not present

## 2020-06-29 DIAGNOSIS — M9901 Segmental and somatic dysfunction of cervical region: Secondary | ICD-10-CM | POA: Diagnosis not present

## 2020-06-29 DIAGNOSIS — M5382 Other specified dorsopathies, cervical region: Secondary | ICD-10-CM | POA: Diagnosis not present

## 2020-06-29 DIAGNOSIS — M9906 Segmental and somatic dysfunction of lower extremity: Secondary | ICD-10-CM | POA: Diagnosis not present

## 2020-07-07 DIAGNOSIS — M9901 Segmental and somatic dysfunction of cervical region: Secondary | ICD-10-CM | POA: Diagnosis not present

## 2020-07-07 DIAGNOSIS — M5382 Other specified dorsopathies, cervical region: Secondary | ICD-10-CM | POA: Diagnosis not present

## 2020-07-07 DIAGNOSIS — M9906 Segmental and somatic dysfunction of lower extremity: Secondary | ICD-10-CM | POA: Diagnosis not present

## 2020-07-07 DIAGNOSIS — M62830 Muscle spasm of back: Secondary | ICD-10-CM | POA: Diagnosis not present

## 2020-07-07 DIAGNOSIS — M9903 Segmental and somatic dysfunction of lumbar region: Secondary | ICD-10-CM | POA: Diagnosis not present

## 2020-07-07 DIAGNOSIS — M9902 Segmental and somatic dysfunction of thoracic region: Secondary | ICD-10-CM | POA: Diagnosis not present

## 2020-07-10 ENCOUNTER — Other Ambulatory Visit: Payer: Self-pay | Admitting: Adult Health

## 2020-07-10 ENCOUNTER — Other Ambulatory Visit: Payer: Self-pay | Admitting: Adult Health Nurse Practitioner

## 2020-07-13 DIAGNOSIS — M9901 Segmental and somatic dysfunction of cervical region: Secondary | ICD-10-CM | POA: Diagnosis not present

## 2020-07-13 DIAGNOSIS — M5382 Other specified dorsopathies, cervical region: Secondary | ICD-10-CM | POA: Diagnosis not present

## 2020-07-13 DIAGNOSIS — M9903 Segmental and somatic dysfunction of lumbar region: Secondary | ICD-10-CM | POA: Diagnosis not present

## 2020-07-13 DIAGNOSIS — M62830 Muscle spasm of back: Secondary | ICD-10-CM | POA: Diagnosis not present

## 2020-07-13 DIAGNOSIS — M9906 Segmental and somatic dysfunction of lower extremity: Secondary | ICD-10-CM | POA: Diagnosis not present

## 2020-07-13 DIAGNOSIS — M9902 Segmental and somatic dysfunction of thoracic region: Secondary | ICD-10-CM | POA: Diagnosis not present

## 2020-07-14 ENCOUNTER — Other Ambulatory Visit: Payer: Self-pay | Admitting: Internal Medicine

## 2020-07-14 ENCOUNTER — Encounter: Payer: Self-pay | Admitting: Internal Medicine

## 2020-07-14 MED ORDER — PROGESTERONE MICRONIZED 100 MG PO CAPS: 100.0000 mg | ORAL_CAPSULE | Freq: Every day | ORAL | 1 refills | Status: DC

## 2020-07-21 ENCOUNTER — Other Ambulatory Visit: Payer: Self-pay

## 2020-07-21 ENCOUNTER — Encounter: Payer: Self-pay | Admitting: Cardiology

## 2020-07-21 ENCOUNTER — Ambulatory Visit (INDEPENDENT_AMBULATORY_CARE_PROVIDER_SITE_OTHER): Payer: BC Managed Care – PPO | Admitting: Cardiology

## 2020-07-21 VITALS — BP 152/80 | HR 73 | Ht 65.0 in | Wt 237.6 lb

## 2020-07-21 DIAGNOSIS — I4819 Other persistent atrial fibrillation: Secondary | ICD-10-CM | POA: Diagnosis not present

## 2020-07-21 DIAGNOSIS — I483 Typical atrial flutter: Secondary | ICD-10-CM | POA: Diagnosis not present

## 2020-07-21 DIAGNOSIS — G4733 Obstructive sleep apnea (adult) (pediatric): Secondary | ICD-10-CM | POA: Diagnosis not present

## 2020-07-21 DIAGNOSIS — I10 Essential (primary) hypertension: Secondary | ICD-10-CM | POA: Diagnosis not present

## 2020-07-21 NOTE — Progress Notes (Signed)
Electrophysiology Office Note   Date:  07/21/2020   ID:  Monica Morgan, DOB 28-Jan-1961, MRN [id number]  PCP:  Unk Pinto, MD  Cardiologist:  Radford Pax Primary Electrophysiologist:  Kyren Vaux Meredith Leeds, MD    No chief complaint on file.    History of Present Illness: Monica Morgan is a 60 y.o. female who presents today for electrophysiology evaluation.     She has a history of hypertension, hyperlipidemia, atrial fibrillation, and atrial flutter.  She is status post AF ablation 01/13/2016.  She had a repeat ablation 04/11/2017 with extensive left atrial ablation for multiple atypical atrial flutter's.  She required cardioversion to return to sinus rhythm.  She was discharged on flecainide.  Today, denies symptoms of palpitations, chest pain, shortness of breath, orthopnea, PND, lower extremity edema, claudication, dizziness, presyncope, syncope, bleeding, or neurologic sequela. The patient is tolerating medications without difficulties.  Since last being seen she has done well.  She notes no further episodes of atrial fibrillation.  Her main issue today is orthopedic.  She has tightness and pain in her right thigh.  She is seeing a Restaurant manager, fast food.   Past Medical History:  Diagnosis Date  . Arthritis    "probably in my knees; maybe in my hands" (04/11/2017)  . Asthma attack 10/2015 X 1  . Atrial flutter ([place]) 10/2015  . Heart murmur    "comes and goes" (04/11/2017)  . Hepatitis 1972   "don't know what kind"  . High cholesterol   . Hypertension   . Hypothyroidism   . Malignant melanoma ([place]) 10/1975   R leg as a teen  . Malignant melanoma of leg ([place])    "right thigh"  . PAF (paroxysmal atrial fibrillation) ([place])   . Pneumonia 1966; 1967   "left lung collapsed one of these times"  . Psoriasis   . Rosacea   . Vitamin D deficiency    Past Surgical History:  Procedure Laterality Date  . A-FLUTTER ABLATION N/A 02/08/2017   Procedure: A-Flutter Ablation;  Surgeon: Constance Haw, MD;  Location: [place] [facility];  Service: Cardiovascular;  Laterality: N/A;  . ATRIAL FIBRILLATION ABLATION N/A 04/11/2017   Procedure: ATRIAL FIBRILLATION ABLATION;  Surgeon: Constance Haw, MD;  Location: [place] [facility];  Service: Cardiovascular;  Laterality: N/A;  . CARDIOVERSION N/A 10/25/2015   Procedure: CARDIOVERSION;  Surgeon: Sueanne Margarita, MD;  Location: [place];  Service: Cardiovascular;  Laterality: N/A;  . [place]  . CESAREAN SECTION WITH BILATERAL TUBAL LIGATION  1982  . ELECTROPHYSIOLOGIC STUDY N/A 01/13/2016   Procedure: Atrial Fibrillation Ablation;  Surgeon: Vinessa Macconnell Meredith Leeds, MD;  Location: [place] [facility];  Service: Cardiovascular;  Laterality: N/A;  . ENDOMETRIAL ABLATION  ~ 2012  . HYSTEROSCOPY  03/28/2020   Dr. Lanny Cramp  . MELANOMA EXCISION Right 10/1975   outer thigh "malignant"  . REFRACTIVE SURGERY Bilateral 1990s  . TEE WITHOUT CARDIOVERSION N/A 10/25/2015   Procedure: TRANSESOPHAGEAL ECHOCARDIOGRAM (TEE);  Surgeon: Sueanne Margarita, MD;  [facility];  Service: Cardiovascular;  Laterality: N/A;  . TEE WITHOUT CARDIOVERSION N/A 01/11/2016   Procedure: TRANSESOPHAGEAL ECHOCARDIOGRAM (TEE);  Surgeon: Josue Hector, MD;  Location: [place];  Service: Cardiovascular;  Laterality: N/A;  . TONSILLECTOMY  1967  . TUBAL LIGATION       Current Outpatient Medications  Medication Sig Dispense Refill  . Ascorbic Acid (VITAMIN C) 500 MG CHEW Chew by mouth. 2 times per day     . augmented  betamethasone dipropionate (DIPROLENE-AF) 0.05 % cream APPLY VERY SPARINGLY TO PSORIASIS RASH (DO NOT USE ON FACE ! ) 50 g 5  . Black Pepper-Turmeric 3-500 MG CAPS Take by mouth.    . Calcium 600-400 MG-UNIT CHEW Chew 1 each by mouth 2 (two) times daily.     . Cholecalciferol (VITAMIN D PO) Take 10,000 Units by mouth daily.     . Coenzyme Q10 (CO Q10) 100 MG CAPS Take 100 mg by mouth daily.     . cyclobenzaprine (FLEXERIL) 5 MG  tablet TAKE 1 TABLET BY MOUTH THREE TIMES A DAY AS NEEDED FOR MUSCLE SPASMS 60 tablet 0  . diltiazem (CARDIZEM CD) 120 MG 24 hr capsule TAKE 1 CAPSULE BY MOUTH EVERY DAY 90 capsule 3  . ELIQUIS 5 MG TABS tablet TAKE 1 TABLET BY MOUTH TWICE A DAY 60 tablet 5  . ezetimibe (ZETIA) 10 MG tablet TAKE 1 TABLET DAILY FOR CHOLESTREROL 90 tablet 3  . flecainide (TAMBOCOR) 100 MG tablet TAKE 1 TABLET BY MOUTH TWICE A DAY 180 tablet 2  . furosemide (LASIX) 20 MG tablet Take 1 tablet Daily for Fluid Retention 90 tablet 1  . levothyroxine (SYNTHROID) 50 MCG tablet TAKE 1 TABLET DAILY ON EMPTY STOMACH WITH ONLY WATER FOR 30 MIN (NO ANTACID MEDS, CALCIUM OR MAGNESIUM FOR 4 HOURS & AVOID BIOTIN) 90 tablet 3  . Magnesium 500 MG TABS Take 500 mg by mouth every evening.     . montelukast (SINGULAIR) 10 MG tablet Take  1 tablet  Daily for Allergies 90 tablet 0  . Multiple Vitamin (MULTIVITAMIN WITH MINERALS) TABS tablet Take 1 tablet by mouth daily.    . NONFORMULARY OR COMPOUNDED ITEM Apply 1 each topically See admin instructions. Progesterone 8%/69ml Apply 0.7mls topically every day except Saturday.    Marland Kitchen OVER THE COUNTER MEDICATION in the morning and at bedtime. Taking Ferrous Sulfate 325 mg daily.    Vladimir Faster Glycol-Propyl Glycol (SYSTANE OP) Place 1 drop into both eyes daily.    . Probiotic Product (PROBIOTIC DAILY PO) Take 1 tablet by mouth daily.     . progesterone (PROMETRIUM) 100 MG capsule Take 1 capsule (100 mg total) by mouth daily. 30 capsule 1   No current facility-administered medications for this visit.    Allergies:   Lipitor [atorvastatin], Metoprolol, Phentermine, Rythmol [propafenone], Sulfa drugs cross reactors, and Lisinopril   Social History:  The patient  reports that she has never smoked. She has never used smokeless tobacco. She reports current alcohol use. She reports that she does not use drugs.   Family History:  The patient's family history includes Diabetes in her father; Heart  attack in her brother and mother; Heart disease in her father and mother; Hypertension in her father and mother.   ROS:  Please see the history of present illness.   Otherwise, review of systems is positive for none.   All other systems are reviewed and negative.   PHYSICAL EXAM: VS:  BP (!) 152/80   Pulse 73   Ht 5\' 5"  (1.651 m)   Wt 237 lb 9.6 oz (107.8 kg)   LMP 10/29/2013 (Approximate)   SpO2 96%   BMI 39.54 kg/m  , BMI Body mass index is 39.54 kg/m. GEN: Well nourished, well developed, in no acute distress  HEENT: normal  Neck: no JVD, carotid bruits, or masses Cardiac: RRR; no murmurs, rubs, or gallops,no edema  Respiratory:  clear to auscultation bilaterally, normal work of breathing GI: soft, nontender, nondistended, +  BS MS: no deformity or atrophy  Skin: warm and dry Neuro:  Strength and sensation are intact Psych: euthymic mood, full affect  EKG:  EKG is ordered today. Personal review of the ekg ordered shows sinus rhythm, PVCs  Recent Labs: 05/04/2020: ALT 16; BUN 21; Creat 0.86; Hemoglobin 13.6; Magnesium 2.5; Platelets 337; Potassium 4.8; Sodium 139; TSH 2.32    Lipid Panel     Component Value Date/Time   CHOL 181 05/04/2020 1603   TRIG 191 (H) 05/04/2020 1603   HDL 48 (L) 05/04/2020 1603   CHOLHDL 3.8 05/04/2020 1603   VLDL 46 (H) 08/16/2016 1647   LDLCALC 102 (H) 05/04/2020 1603     Wt Readings from Last 3 Encounters:  07/21/20 237 lb 9.6 oz (107.8 kg)  06/27/20 236 lb 3.2 oz (107.1 kg)  05/04/20 232 lb 6.4 oz (105.4 kg)      Other studies Reviewed: Additional studies/ records that were reviewed today include: TTE 12/19/16 Review of the above records today demonstrates:  - Left ventricle: The cavity size was normal. Wall thickness was   normal. Systolic function was normal. The estimated ejection   fraction was in the range of 55% to 60%. Wall motion was normal;   there were no regional wall motion abnormalities. Features are   consistent with a  pseudonormal left ventricular filling pattern,   with concomitant abnormal relaxation and increased filling   pressure (grade 2 diastolic dysfunction). - Aortic valve: There was no stenosis. - Mitral valve: There was mild regurgitation. - Left atrium: The atrium was mildly dilated. - Right ventricle: The cavity size was normal. Systolic function   was normal. - Right atrium: The atrium was mildly dilated. - Tricuspid valve: Peak RV-RA gradient (S): 32 mm Hg. - Pulmonary arteries: PA peak pressure: 35 mm Hg (S). - Inferior vena cava: The vessel was normal in size. The   respirophasic diameter changes were in the normal range (>= 50%),   consistent with normal central venous pressure.   ASSESSMENT AND PLAN:  1.  Persistent atrial fibrillation: Currently on Eliquis and diltiazem.  Status post ablation 04/10/2017.  Remains in sinus rhythm on flecainide.  High risk medication monitoring.  Persistent atrial fibrillation: Currently on Eliquis and diltiazem.  Status post ablation 04/10/2017.  Remains in sinus rhythm on flecainide.     2.  Hypertension: Mildly elevated today.  Usually well controlled.  3.  Mild obstructive sleep apnea: Not on CPAP  4.  Typical atrial flutter: Status post ablation  5.  Morbid obesity: Diet and exercise encouraged  Current medicines are reviewed at length with the patient today.   The patient does not have concerns regarding her medicines.  The following changes were made today: None  Labs/ tests ordered today include:  Orders Placed This Encounter  Procedures  . EKG 12-Lead     Disposition:   FU with Murad Staples 12 months  Signed, Ewelina Naves Meredith Leeds, MD  07/21/2020 3:46 PM     [place] [address] [place] [address] [phone] (office) [phone] (fax)

## 2020-07-21 NOTE — Patient Instructions (Signed)
Medication Instructions:  Your physician recommends that you continue on your current medications as directed. Please refer to the Current Medication list given to you today.  Labwork: None ordered.  Testing/Procedures: None ordered.  Follow-Up: Your physician wants you to follow-up in: one year with Monica Lai, MD or one of the following Advanced Practice Providers on your designated Care Team:    Monica Marshall, NP  Tommye Standard, PA-C  Legrand Como "[place]" [place], [place]   You will receive a reminder letter in the mail two months in advance. If you don't receive a letter, please call our office to schedule the follow-up appointment.   Any Other Special Instructions Will Be Listed Below (If Applicable).  If you need a refill on your cardiac medications before your next appointment, please call your pharmacy.

## 2020-07-27 DIAGNOSIS — M9903 Segmental and somatic dysfunction of lumbar region: Secondary | ICD-10-CM | POA: Diagnosis not present

## 2020-07-27 DIAGNOSIS — M62830 Muscle spasm of back: Secondary | ICD-10-CM | POA: Diagnosis not present

## 2020-07-27 DIAGNOSIS — M5382 Other specified dorsopathies, cervical region: Secondary | ICD-10-CM | POA: Diagnosis not present

## 2020-07-27 DIAGNOSIS — M9901 Segmental and somatic dysfunction of cervical region: Secondary | ICD-10-CM | POA: Diagnosis not present

## 2020-07-27 DIAGNOSIS — M9902 Segmental and somatic dysfunction of thoracic region: Secondary | ICD-10-CM | POA: Diagnosis not present

## 2020-07-27 DIAGNOSIS — M9906 Segmental and somatic dysfunction of lower extremity: Secondary | ICD-10-CM | POA: Diagnosis not present

## 2020-07-29 DIAGNOSIS — M9903 Segmental and somatic dysfunction of lumbar region: Secondary | ICD-10-CM | POA: Diagnosis not present

## 2020-07-29 DIAGNOSIS — M9901 Segmental and somatic dysfunction of cervical region: Secondary | ICD-10-CM | POA: Diagnosis not present

## 2020-07-29 DIAGNOSIS — M62830 Muscle spasm of back: Secondary | ICD-10-CM | POA: Diagnosis not present

## 2020-07-29 DIAGNOSIS — M5382 Other specified dorsopathies, cervical region: Secondary | ICD-10-CM | POA: Diagnosis not present

## 2020-07-29 DIAGNOSIS — M9902 Segmental and somatic dysfunction of thoracic region: Secondary | ICD-10-CM | POA: Diagnosis not present

## 2020-07-29 DIAGNOSIS — M9906 Segmental and somatic dysfunction of lower extremity: Secondary | ICD-10-CM | POA: Diagnosis not present

## 2020-07-30 ENCOUNTER — Other Ambulatory Visit: Payer: Self-pay | Admitting: Adult Health Nurse Practitioner

## 2020-08-05 ENCOUNTER — Other Ambulatory Visit: Payer: Self-pay | Admitting: Internal Medicine

## 2020-08-10 ENCOUNTER — Other Ambulatory Visit: Payer: Self-pay | Admitting: Internal Medicine

## 2020-08-10 ENCOUNTER — Other Ambulatory Visit: Payer: Self-pay | Admitting: Adult Health

## 2020-08-10 DIAGNOSIS — M9903 Segmental and somatic dysfunction of lumbar region: Secondary | ICD-10-CM | POA: Diagnosis not present

## 2020-08-10 DIAGNOSIS — M9902 Segmental and somatic dysfunction of thoracic region: Secondary | ICD-10-CM | POA: Diagnosis not present

## 2020-08-10 DIAGNOSIS — M62830 Muscle spasm of back: Secondary | ICD-10-CM | POA: Diagnosis not present

## 2020-08-10 DIAGNOSIS — M9901 Segmental and somatic dysfunction of cervical region: Secondary | ICD-10-CM | POA: Diagnosis not present

## 2020-08-10 DIAGNOSIS — M5382 Other specified dorsopathies, cervical region: Secondary | ICD-10-CM | POA: Diagnosis not present

## 2020-08-11 ENCOUNTER — Other Ambulatory Visit: Payer: Self-pay | Admitting: Internal Medicine

## 2020-08-11 MED ORDER — FLECAINIDE ACETATE 100 MG PO TABS
ORAL_TABLET | ORAL | 3 refills | Status: DC
Start: 2020-08-11 — End: 2021-10-11

## 2020-08-22 ENCOUNTER — Other Ambulatory Visit: Payer: Self-pay

## 2020-08-22 ENCOUNTER — Ambulatory Visit: Payer: BC Managed Care – PPO | Admitting: Physician Assistant

## 2020-08-22 VITALS — BP 161/69 | HR 68 | Temp 98.7°F | Resp 18 | Ht 66.0 in | Wt 238.0 lb

## 2020-08-22 DIAGNOSIS — H1032 Unspecified acute conjunctivitis, left eye: Secondary | ICD-10-CM

## 2020-08-22 MED ORDER — ERYTHROMYCIN 5 MG/GM OP OINT: 1.0000 "application " | TOPICAL_OINTMENT | Freq: Every day | OPHTHALMIC | 0 refills | Status: DC

## 2020-08-22 NOTE — Progress Notes (Signed)
Patient has taken medication today. Patient has eaten today. Patient reports pain in the stye on the left eye. Pain scaled at a 3. Patient noticed stye Friday morning.

## 2020-08-22 NOTE — Patient Instructions (Signed)
You will use the prescribed ointment once daily at bedtime for 4 days.  I encourage you to continue to use warm compresses every 3-4 hours throughout the day.  I hope that you feel better soon, please let us know if there is anything else we can do for you  Kennieth Rad, PA-C Physician Assistant [place] [email or web address]    Stye A stye, also known as a hordeolum, is a bump that forms on an eyelid. It may look like a pimple next to the eyelash. A stye can form inside the eyelid (internal stye) or outside the eyelid (external stye). A stye can cause redness, swelling, and pain on the eyelid. Styes are very common. Anyone can get them at any age. They usually occur in just one eye, but you may have more than one in either eye. What are the causes? A stye is caused by an infection. The infection is almost always caused by bacteria called Staphylococcus aureus. This is a common type of bacteria that lives on the skin. An internal stye may result from an infected oil-producing gland inside the eyelid. An external stye may be caused by an infection at the base of the eyelash (hair follicle). What increases the risk? You are more likely to develop a stye if:  You have had a stye before.  You have any of these conditions: ? Diabetes. ? Red, itchy, inflamed eyelids (blepharitis). ? A skin condition such as seborrheic dermatitis or rosacea. ? High fat levels in your blood (lipids). What are the signs or symptoms? The most common symptom of a stye is eyelid pain. Internal styes are more painful than external styes. Other symptoms may include:  Painful swelling of your eyelid.  A scratchy feeling in your eye.  Tearing and redness of your eye.  Pus draining from the stye.   How is this diagnosed? Your health care provider may be able to diagnose a stye just by examining your eye. The health care provider may also check to make  sure:  You do not have a fever or other signs of a more serious infection.  The infection has not spread to other parts of your eye or areas around your eye. How is this treated? Most styes will clear up in a few days without treatment or with warm compresses applied to the area. You may need to use antibiotic drops or ointment to treat an infection. In some cases, if your stye does not heal with routine treatment, your health care provider may drain pus from the stye using a thin blade or needle. This may be done if the stye is large, causing a lot of pain, or affecting your vision. Follow these instructions at home:  Take over-the-counter and prescription medicines only as told by your health care provider. This includes eye drops or ointments.  If you were prescribed an antibiotic medicine, apply or use it as told by your health care provider. Do not stop using the antibiotic even if your condition improves.  Apply a warm, wet cloth (warm compress) to your eye for 5-10 minutes, 4 times a day.  Clean the affected eyelid as directed by your health care provider.  Do not wear contact lenses or eye makeup until your stye has healed.  Do not try to pop or drain the stye.  Do not rub your eye. Contact a health care provider if:  You have chills or a fever.  Your stye does not go away after  several days.  Your stye affects your vision.  Your eyeball becomes swollen, red, or painful. Get help right away if:  You have pain when moving your eye around. Summary  A stye is a bump that forms on an eyelid. It may look like a pimple next to the eyelash.  A stye can form inside the eyelid (internal stye) or outside the eyelid (external stye). A stye can cause redness, swelling, and pain on the eyelid.  Your health care provider may be able to diagnose a stye just by examining your eye.  Apply a warm, wet cloth (warm compress) to your eye for 5-10 minutes, 4 times a day. This  information is not intended to replace advice given to you by your health care provider. Make sure you discuss any questions you have with your health care provider. Document Revised: 12/10/2019 Document Reviewed: 12/10/2019 Elsevier Patient Education  [employer].

## 2020-08-22 NOTE — Progress Notes (Signed)
New Patient Office Visit  Subjective:  Patient ID: Monica Morgan, female    DOB: May 14, 1960  Age: 60 y.o. MRN: [id number]  CC:  Chief Complaint  Patient presents with  . Eye Pain    HPI Monica Morgan reports that she has been having a tender bump on her left upper eyelid for the last 4 days.  States that she has been experiencing crusting, some difficulty with vision due to swelling.  Denies injury or trauma, denies sick contacts.  States that she has been using over-the-counter stye drops, Motrin, warm and cold compresses without much relief.     Past Medical History:  Diagnosis Date  . Arthritis    "probably in my knees; maybe in my hands" (04/11/2017)  . Asthma attack 10/2015 X 1  . Atrial flutter ([place]) 10/2015  . Heart murmur    "comes and goes" (04/11/2017)  . Hepatitis 1972   "don't know what kind"  . High cholesterol   . Hypertension   . Hypothyroidism   . Malignant melanoma ([place]) 10/1975   R leg as a teen  . Malignant melanoma of leg ([place])    "right thigh"  . PAF (paroxysmal atrial fibrillation) ([place])   . Pneumonia 1966; 1967   "left lung collapsed one of these times"  . Psoriasis   . Rosacea   . Vitamin D deficiency     Past Surgical History:  Procedure Laterality Date  . A-FLUTTER ABLATION N/A 02/08/2017   Procedure: A-Flutter Ablation;  Surgeon: Constance Haw, MD;  Location: [facility];  Service: Cardiovascular;  Laterality: N/A;  . ATRIAL FIBRILLATION ABLATION N/A 04/11/2017   Procedure: ATRIAL FIBRILLATION ABLATION;  Surgeon: Constance Haw, MD;  Location: [facility];  Service: Cardiovascular;  Laterality: N/A;  . CARDIOVERSION N/A 10/25/2015   Procedure: CARDIOVERSION;  Surgeon: Sueanne Margarita, MD;  Location: [facility];  Service: Cardiovascular;  Laterality: N/A;  . [place]  . CESAREAN SECTION WITH BILATERAL TUBAL LIGATION  1982  . ELECTROPHYSIOLOGIC STUDY N/A 01/13/2016   Procedure: Atrial  Fibrillation Ablation;  Surgeon: Will Meredith Leeds, MD;  Location: [facility];  Service: Cardiovascular;  Laterality: N/A;  . ENDOMETRIAL ABLATION  ~ 2012  . HYSTEROSCOPY  03/28/2020   Dr. Lanny Cramp  . MELANOMA EXCISION Right 10/1975   outer thigh "malignant"  . REFRACTIVE SURGERY Bilateral 1990s  . TEE WITHOUT CARDIOVERSION N/A 10/25/2015   Procedure: TRANSESOPHAGEAL ECHOCARDIOGRAM (TEE);  Surgeon: Sueanne Margarita, MD;  Location: [facility];  Service: Cardiovascular;  Laterality: N/A;  . TEE WITHOUT CARDIOVERSION N/A 01/11/2016   Procedure: TRANSESOPHAGEAL ECHOCARDIOGRAM (TEE);  Surgeon: Josue Hector, MD;  Location: [facility];  Service: Cardiovascular;  Laterality: N/A;  . TONSILLECTOMY  1967  . TUBAL LIGATION      Family History  Problem Relation Age of Onset  . Heart disease Mother   . Hypertension Mother   . Heart attack Mother   . Heart disease Father   . Diabetes Father   . Hypertension Father   . Heart attack Brother   . Colon cancer Neg Hx   . Stroke Neg Hx     Social History   Socioeconomic History  . Marital status: Divorced    Spouse name: Not on file  . Number of children: Not on file  . Years of education: Not on file  . Highest education level: Not on file  Occupational History  . Not on file  Tobacco Use  .  Smoking status: Never Smoker  . Smokeless tobacco: Never Used  Vaping Use  . Vaping Use: Never used  Substance and Sexual Activity  . Alcohol use: Yes    Comment: rare since covid, social  . Drug use: No  . Sexual activity: Not Currently    Partners: Male    Birth control/protection: None    Comment: partner with health issues  Other Topics Concern  . Not on file  Social History Narrative  . Not on file   Social Determinants of Health   Financial Resource Strain: Not on file  Food Insecurity: Not on file  Transportation Needs: Not on file  Physical Activity: Not on file  Stress: Not on file  Social Connections: Not on file   Intimate Partner Violence: Not on file    ROS Review of Systems  Constitutional: Negative for chills and fever.  HENT: Negative for congestion, facial swelling, postnasal drip and sore throat.   Eyes: Positive for pain, discharge and visual disturbance.  Respiratory: Negative for cough.   Gastrointestinal: Negative for nausea and vomiting.  Endocrine: Negative.   Genitourinary: Negative.   Musculoskeletal: Negative.   Skin: Negative.   Allergic/Immunologic: Negative.   Neurological: Negative for headaches.  Hematological: Negative.     Objective:   Today's Vitals: BP (!) 161/69 (BP Location: Left Arm, Patient Position: Sitting, Cuff Size: Normal)   Pulse 68   Temp 98.7 F (37.1 C) (Oral)   Resp 18   Ht 5\' 6"  (1.676 m)   Wt 238 lb (108 kg)   LMP 10/29/2013 (Approximate)   SpO2 97%   BMI 38.41 kg/m   Physical Exam Vitals and nursing note reviewed.  Constitutional:      Appearance: Normal appearance.  HENT:     Head: Normocephalic and atraumatic.     Right Ear: Tympanic membrane, ear canal and external ear normal.     Left Ear: Tympanic membrane, ear canal and external ear normal.     Nose: Nose normal.     Mouth/Throat:     Mouth: Mucous membranes are moist.     Pharynx: Oropharynx is clear.  Eyes:     General:        Left eye: Discharge and hordeolum present.No foreign body.     Extraocular Movements: Extraocular movements intact.     Right eye: Normal extraocular motion.     Left eye: Normal extraocular motion.     Conjunctiva/sclera:     Left eye: Left conjunctiva is injected. Exudate present.     Pupils: Pupils are equal, round, and reactive to light.   Cardiovascular:     Rate and Rhythm: Normal rate and regular rhythm.     Pulses: Normal pulses.     Heart sounds: Normal heart sounds.  Pulmonary:     Effort: Pulmonary effort is normal.     Breath sounds: Normal breath sounds.  Musculoskeletal:        General: Normal range of motion.     Cervical  back: Normal range of motion and neck supple.  Lymphadenopathy:     Head:     Right side of head: No submandibular adenopathy.     Left side of head: No submandibular adenopathy.     Cervical: No cervical adenopathy.  Skin:    General: Skin is warm and dry.  Neurological:     General: No focal deficit present.     Mental Status: She is alert and oriented to person, place, and time.  Psychiatric:  Mood and Affect: Mood normal.        Behavior: Behavior normal.        Thought Content: Thought content normal.        Judgment: Judgment normal.     Assessment & Plan:   Problem List Items Addressed This Visit   None   Visit Diagnoses    Acute bacterial conjunctivitis of left eye    -  Primary   Relevant Medications   erythromycin ophthalmic ointment      Outpatient Encounter Medications as of 08/22/2020  Medication Sig  . Ascorbic Acid (VITAMIN C) 500 MG CHEW Chew by mouth. 2 times per day   . augmented betamethasone dipropionate (DIPROLENE-AF) 0.05 % cream APPLY VERY SPARINGLY TO PSORIASIS RASH (DO NOT USE ON FACE ! )  . Black Pepper-Turmeric 3-500 MG CAPS Take by mouth.  . Calcium 600-400 MG-UNIT CHEW Chew 1 each by mouth 2 (two) times daily.   . Cholecalciferol (VITAMIN D PO) Take 10,000 Units by mouth daily.   . Coenzyme Q10 (CO Q10) 100 MG CAPS Take 100 mg by mouth daily.   . cyclobenzaprine (FLEXERIL) 5 MG tablet Take  1 tablet  3 x /day  as needed for Muscle Spasm  . diltiazem (CARDIZEM CD) 120 MG 24 hr capsule TAKE 1 CAPSULE BY MOUTH EVERY DAY  . ELIQUIS 5 MG TABS tablet TAKE 1 TABLET BY MOUTH TWICE A DAY  . erythromycin ophthalmic ointment Place 1 application into the left eye at bedtime.  Marland Kitchen ezetimibe (ZETIA) 10 MG tablet TAKE 1 TABLET DAILY FOR CHOLESTREROL  . furosemide (LASIX) 20 MG tablet Take 1 tablet Daily for Fluid Retention  . levothyroxine (SYNTHROID) 50 MCG tablet TAKE 1 TABLET DAILY ON EMPTY STOMACH WITH ONLY WATER FOR 30 MIN (NO ANTACID MEDS, CALCIUM OR  MAGNESIUM FOR 4 HOURS & AVOID BIOTIN)  . Magnesium 500 MG TABS Take 500 mg by mouth every evening.   . montelukast (SINGULAIR) 10 MG tablet TAKE 1 TABLET BY MOUTH DAILY FOR ALLERGIES  . Multiple Vitamin (MULTIVITAMIN WITH MINERALS) TABS tablet Take 1 tablet by mouth daily.  Marland Kitchen OVER THE COUNTER MEDICATION in the morning and at bedtime. Taking Ferrous Sulfate 325 mg daily.  Vladimir Faster Glycol-Propyl Glycol (SYSTANE OP) Place 1 drop into both eyes daily.  . Probiotic Product (PROBIOTIC DAILY PO) Take 1 tablet by mouth daily.   . progesterone (PROMETRIUM) 100 MG capsule TAKE 1 CAPSULE BY MOUTH EVERY DAY  . [DISCONTINUED] NONFORMULARY OR COMPOUNDED ITEM Apply 1 each topically See admin instructions. Progesterone 8%/28ml Apply 0.78mls topically every day except Saturday.  . flecainide (TAMBOCOR) 100 MG tablet Take  1 tablet  2 x /day (every 12 hours)  for Afib   No facility-administered encounter medications on file as of 08/22/2020.  1. Acute bacterial conjunctivitis of left eye Bacterial conjunctivitis versus hordeolum, patient experiencing discharge, crusting.  Trial of erythromycin ointment, patient education given warm compresses, over-the-counter pain medication as needed.  Red flags given for prompt reevaluation. - erythromycin ophthalmic ointment; Place 1 application into the left eye at bedtime.  Dispense: 3.5 g; Refill: 0   I have reviewed the patient's medical history (PMH, PSH, Social History, Family History, Medications, and allergies) , and have been updated if relevant. I spent 28 minutes reviewing chart and  face to face time with patient.    Follow-up: Return if symptoms worsen or fail to improve.   Loraine Grip Mayers, PA-C

## 2020-09-05 ENCOUNTER — Ambulatory Visit: Payer: BC Managed Care – PPO | Admitting: Adult Health

## 2020-09-05 ENCOUNTER — Other Ambulatory Visit: Payer: Self-pay | Admitting: Internal Medicine

## 2020-09-07 DIAGNOSIS — M9901 Segmental and somatic dysfunction of cervical region: Secondary | ICD-10-CM | POA: Diagnosis not present

## 2020-09-07 DIAGNOSIS — M9903 Segmental and somatic dysfunction of lumbar region: Secondary | ICD-10-CM | POA: Diagnosis not present

## 2020-09-07 DIAGNOSIS — M62830 Muscle spasm of back: Secondary | ICD-10-CM | POA: Diagnosis not present

## 2020-09-07 DIAGNOSIS — M9902 Segmental and somatic dysfunction of thoracic region: Secondary | ICD-10-CM | POA: Diagnosis not present

## 2020-09-07 DIAGNOSIS — M5382 Other specified dorsopathies, cervical region: Secondary | ICD-10-CM | POA: Diagnosis not present

## 2020-10-02 ENCOUNTER — Other Ambulatory Visit: Payer: Self-pay | Admitting: Internal Medicine

## 2020-10-05 ENCOUNTER — Other Ambulatory Visit: Payer: Self-pay | Admitting: Internal Medicine

## 2020-10-05 DIAGNOSIS — M9903 Segmental and somatic dysfunction of lumbar region: Secondary | ICD-10-CM | POA: Diagnosis not present

## 2020-10-05 DIAGNOSIS — M9902 Segmental and somatic dysfunction of thoracic region: Secondary | ICD-10-CM | POA: Diagnosis not present

## 2020-10-05 DIAGNOSIS — M9901 Segmental and somatic dysfunction of cervical region: Secondary | ICD-10-CM | POA: Diagnosis not present

## 2020-10-05 DIAGNOSIS — M62830 Muscle spasm of back: Secondary | ICD-10-CM | POA: Diagnosis not present

## 2020-10-05 DIAGNOSIS — M5382 Other specified dorsopathies, cervical region: Secondary | ICD-10-CM | POA: Diagnosis not present

## 2020-10-05 NOTE — Progress Notes (Signed)
4 MONTH FOLLOW UP  Assessment and Plan:  Nakiea was seen today for a 4 month follow up of Hypertension, Persistent atrial Fibrillation,Atrial Flutter, Hypothyroidism, Morbid Obesity, Abnormal Glucose and Hyperlipidemia .  Diagnoses and all orders for this visit:   Essential hypertension Continue medications Monitor blood pressure at home; call if consistently over 130/80 Continue DASH diet.   Reminder to go to the ER if any CP, SOB, nausea, dizziness, severe HA, changes vision/speech, left arm numbness and tingling and jaw pain. -     CBC with Differential/Platelet -     COMPLETE METABOLIC PANEL WITH GFR -     Magnesium -     TSH   Persistent atrial fibrillation (HCC) Rate controlled; cardiology following; continue eliquis   Atrial flutter with rapid ventricular response ([place]) Rate controlled; cardiology is following  Hypothyroidism, unspecified type continue medications the same pending lab results reminded to take on an empty stomach 30-16mins before food.  -     TSH   Morbid obesity ([place]) Long discussion about weight loss, diet, and exercise Recommended diet heavy in fruits and veggies and low in animal meats, cheeses, and dairy products, appropriate calorie intake Patient will work on  Discussed appropriate weight for height  Follow up at next visit  Hyperlipidemia, mixed Mild elevations working on lifestyle Continue low cholesterol diet and exercise.  Check lipid panel.  -     Lipid panel -     TSH   Medication management -     CBC with Differential/Platelet -     COMPLETE METABOLIC PANEL WITH GFR -     Magnesium  Abnormal glucose Discussed disease and risks Discussed diet/exercise, weight management         Discussed med's effects and SE's. Screening labs and tests as requested with regular follow-up as recommended. Over 40 minutes of exam, counseling, chart review, and complex, high level critical decision making was performed this visit.    Future Appointments  Date Time Provider [place]  01/02/2021  4:15 PM Glendale Chard, MD [facility] None  01/11/2021  4:00 PM Unk Pinto, MD [facility] None  05/04/2021  3:00 PM Liane Comber, NP [facility] None     HPI  60 y.o. female  presents for a follow up for Hypertension, Persistent atrial Fibrillation,Atrial Flutter, Hypothyroidism, Morbid Obesity and Hyperlipidemia.  She is happily divorced, has a good long term relationship of 15+ years, she has 2 grown children, 5 grandchildren. 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   Follows with [facility] Dr. Lanny Cramp, recently underwent hysteroscopy for post menopausal spotting with inadequate sample , planning for [place] 1/20 with follow up to discuss further recommendations in March 2022.   BMI is Body mass index is 39.06 kg/m., she has been working on diet and exercise, trying to eat the right things, watching portions. Poor candidate phentermine due to heart, wellbutrin caused very dry mouth without significant benefit. Would consider wegovy but not at this time.  Wt Readings from Last 3 Encounters:  10/06/20 242 lb (109.8 kg)  08/22/20 238 lb (108 kg)  07/21/20 237 lb 9.6 oz (107.8 kg)   She has a. Fib and hx of flutter with RVR, had ablations in 2017/2018 by Dr Curt Bears.  She remains on Eliquis for CHA2DsVASc 2. Taking diltiazem 120 mg daily and flecainide. Last visit with Dr. Curt Bears 07/2020 -remains in sinus rhythm on flecainide  Her blood pressure has been controlled at  home, today their BP is BP: 114/76 She does workout, but reports fairly active around the home, but does plan to walk in pool once warmer. She denies chest pain, shortness of breath, dizziness.   She is on cholesterol medication(newly on Zetia) and denies myalgias. Her cholesterol is not at goal, working on lifestyle. The cholesterol last visit was:   Lab Results  Component Value Date    CHOL 181 05/04/2020   HDL 48 (L) 05/04/2020   LDLCALC 102 (H) 05/04/2020   TRIG 191 (H) 05/04/2020   CHOLHDL 3.8 05/04/2020   She has been working on diet and exercise for prediabetes and denies nausea, paresthesia of the feet, polydipsia, polyuria, visual disturbances and vomiting. Last A1C in the office was:  Lab Results  Component Value Date   HGBA1C 5.7 (H) 05/04/2020    Last GFR: Lab Results  Component Value Date   GFRNONAA 73 05/04/2020   She is on thyroid medication. Her medication was not changed last visit.  50 mcg daily.  Lab Results  Component Value Date   TSH 2.32 05/04/2020   Patient is on Vitamin D supplement.   Lab Results  Component Value Date   VD25OH 83 01/27/2020      She has persistent but stable leukocytosis ongoing since 2017,Path smear review 05/04/20 showed reactive changes only without atypical  CBC Latest Ref Rng & Units 05/04/2020 01/27/2020 10/22/2019  WBC 3.8 - 10.8 Thousand/uL 12.2(H) 11.5(H) 10.9(H)  Hemoglobin 11.7 - 15.5 g/dL 13.6 13.3 13.7  Hematocrit 35.0 - 45.0 % 40.6 40.5 41.8  Platelets 140 - 400 Thousand/uL 337 311 326   Lab Results  Component Value Date   IRON 71 10/22/2019   TIBC 325 10/22/2019   FERRITIN 107 10/22/2019      Current Medications:  Current Outpatient Medications on File Prior to Visit  Medication Sig Dispense Refill   Ascorbic Acid (VITAMIN C) 500 MG CHEW Chew by mouth. 2 times per day      augmented betamethasone dipropionate (DIPROLENE-AF) 0.05 % cream APPLY VERY SPARINGLY TO PSORIASIS RASH (DO NOT USE ON FACE ! ) 50 g 5   Black Pepper-Turmeric 3-500 MG CAPS Take by mouth.     Calcium 600-400 MG-UNIT CHEW Chew 1 each by mouth 2 (two) times daily.      Cholecalciferol (VITAMIN D PO) Take 10,000 Units by mouth daily.      Coenzyme Q10 (CO Q10) 100 MG CAPS Take 100 mg by mouth daily.      cromolyn (NASALCROM) 5.2 MG/ACT nasal spray Place 1 spray into both nostrils in the morning and at bedtime.     cyclobenzaprine  (FLEXERIL) 5 MG tablet TAKE 1 TABLET BY MOUTH 3 X /DAY AS NEEDED FOR MUSCLE SPASM 270 tablet 0   diltiazem (CARDIZEM CD) 120 MG 24 hr capsule TAKE 1 CAPSULE BY MOUTH EVERY DAY 90 capsule 3   ELIQUIS 5 MG TABS tablet TAKE 1 TABLET BY MOUTH TWICE A DAY 60 tablet 5   ezetimibe (ZETIA) 10 MG tablet TAKE 1 TABLET DAILY FOR CHOLESTREROL 90 tablet 3   flecainide (TAMBOCOR) 100 MG tablet Take  1 tablet  2 x /day (every 12 hours)  for Afib 180 tablet 3   furosemide (LASIX) 20 MG tablet Take 1 tablet Daily for Fluid Retention 90 tablet 1   levothyroxine (SYNTHROID) 50 MCG tablet TAKE 1 TABLET DAILY ON EMPTY STOMACH WITH ONLY WATER FOR 30 MIN (NO ANTACID MEDS, CALCIUM OR MAGNESIUM FOR 4 HOURS & AVOID  BIOTIN) 270 tablet 1   Magnesium 500 MG TABS Take 500 mg by mouth every evening.      montelukast (SINGULAIR) 10 MG tablet TAKE 1 TABLET BY MOUTH DAILY FOR ALLERGIES 90 tablet 3   Multiple Vitamin (MULTIVITAMIN WITH MINERALS) TABS tablet Take 1 tablet by mouth daily.     OVER THE COUNTER MEDICATION in the morning and at bedtime. Taking Ferrous Sulfate 325 mg daily.     Polyethyl Glycol-Propyl Glycol (SYSTANE OP) Place 1 drop into both eyes daily.     Probiotic Product (PROBIOTIC DAILY PO) Take 1 tablet by mouth daily.      progesterone (PROMETRIUM) 100 MG capsule TAKE 1 CAPSULE BY MOUTH EVERY DAY 30 capsule 1   erythromycin ophthalmic ointment Place 1 application into the left eye at bedtime. 3.5 g 0   No current facility-administered medications on file prior to visit.   Allergies:  Allergies  Allergen Reactions   Lipitor [Atorvastatin] Other (See Comments)    "neck pain"   Metoprolol Other (See Comments)    DIDN'T WORK FOR PATIENT AND GAINED WEIGHT MIGHT HAVE BEEN TAKEN WITH RYTHMOL   Phentermine Other (See Comments)    Did not help, Insomnia    Rythmol [Propafenone] Other (See Comments)    "weight gain"   Sulfa Drugs Cross Reactors Other (See Comments)    "soreness all over"   Lisinopril Cough    Medical History:  She has Hyperlipidemia, mixed; Vitamin D deficiency; Medication management; Hypothyroidism; Abnormal glucose; Morbid obesity ([place]); Leukocytosis; Persistent atrial fibrillation ([place]); Psoriasis of scalp; Essential hypertension; Multinodular thyroid; and History of malignant melanoma on their problem list.   Surgical History:  She has a past surgical history that includes Melanoma excision (Right, 10/1975); Cesarean section with bilateral tubal ligation (1982); Cesarean section (1980); TEE without cardioversion (N/A, 10/25/2015); Cardioversion (N/A, 10/25/2015); Tonsillectomy (1967); TEE without cardioversion (N/A, 01/11/2016); Cardiac catheterization (N/A, 01/13/2016); A-FLUTTER ABLATION (N/A, 02/08/2017); ATRIAL FIBRILLATION ABLATION (N/A, 04/11/2017); Refractive surgery (Bilateral, 1990s); Tubal ligation; Endometrial ablation (~ 2012); and Hysteroscopy (03/28/2020). Family History:  Herfamily history includes Diabetes in her father; Heart attack in her brother and mother; Heart disease in her father and mother; Hypertension in her father and mother. Social History:  She reports that she has never smoked. She has never used smokeless tobacco. She reports current alcohol use. She reports that she does not use drugs.  Review of Systems: Review of Systems  Constitutional:  Negative for malaise/fatigue and weight loss.  HENT:  Negative for hearing loss and tinnitus.   Eyes:  Negative for blurred vision and double vision.  Respiratory:  Negative for cough, shortness of breath and wheezing.   Cardiovascular:  Negative for chest pain, palpitations, orthopnea, claudication and leg swelling.  Gastrointestinal:  Negative for abdominal pain, blood in stool, constipation, diarrhea, heartburn, melena, nausea and vomiting.  Genitourinary: Negative.   Musculoskeletal:  Positive for joint pain (right hip improves with chiropractic care). Negative for myalgias.  Skin:  Negative for rash.   Neurological:  Negative for dizziness, tingling, sensory change, weakness and headaches.  Endo/Heme/Allergies:  Negative for polydipsia.  Psychiatric/Behavioral: Negative.    All other systems reviewed and are negative.  Physical Exam: Estimated body mass index is 39.06 kg/m as calculated from the following:   Height as of this encounter: 5\' 6"  (1.676 m).   Weight as of this encounter: 242 lb (109.8 kg). BP 114/76   Pulse 62   Temp (!) 96.7 F (35.9 C)   Ht 5\' 6"  (  1.676 m)   Wt 242 lb (109.8 kg)   LMP 10/29/2013 (Approximate)   SpO2 95%   BMI 39.06 kg/m  General Appearance: Well nourished, in no apparent distress.  Eyes: PERRLA, EOMs, conjunctiva no swelling or erythema Sinuses: No Frontal/maxillary tenderness  ENT/Mouth: Ext aud canals clear, normal light reflex with TMs without erythema, bulging. Good dentition. No erythema, swelling, or exudate on post pharynx. Tonsils not swollen or erythematous. Hearing normal.  Neck: Supple, thyroid normal. No bruits  Respiratory: Respiratory effort normal, BS equal bilaterally without rales, rhonchi, wheezing or stridor.  Cardio: RRR without murmurs, rubs or gallops. Brisk peripheral pulses without edema.  Chest: symmetric, with normal excursions and percussion.  Breasts: defer to GYN Abdomen: Soft, obese abdomen, nontender, no guarding, rebound, hernias, masses, or organomegaly.  Lymphatics: Non tender without lymphadenopathy.  Genitourinary: defer to GYN Musculoskeletal: Full ROM all peripheral extremities,5/5 strength, and normal gait.  Skin: Warm, dry without rashes, lesions, ecchymosis. Neuro: Cranial nerves intact, reflexes equal bilaterally. Normal muscle tone, no cerebellar symptoms. Sensation intact.  Psych: Awake and oriented X 3, normal affect, Insight and Judgment appropriate.     Deliana Avalos W [place] 4:07 PM  [facility]

## 2020-10-06 ENCOUNTER — Encounter: Payer: Self-pay | Admitting: Adult Health

## 2020-10-06 ENCOUNTER — Other Ambulatory Visit: Payer: Self-pay

## 2020-10-06 ENCOUNTER — Ambulatory Visit (INDEPENDENT_AMBULATORY_CARE_PROVIDER_SITE_OTHER): Payer: BC Managed Care – PPO | Admitting: Nurse Practitioner

## 2020-10-06 VITALS — BP 114/76 | HR 62 | Temp 96.7°F | Ht 66.0 in | Wt 242.0 lb

## 2020-10-06 DIAGNOSIS — E611 Iron deficiency: Secondary | ICD-10-CM

## 2020-10-06 DIAGNOSIS — E782 Mixed hyperlipidemia: Secondary | ICD-10-CM | POA: Diagnosis not present

## 2020-10-06 DIAGNOSIS — I10 Essential (primary) hypertension: Secondary | ICD-10-CM | POA: Diagnosis not present

## 2020-10-06 DIAGNOSIS — I4819 Other persistent atrial fibrillation: Secondary | ICD-10-CM | POA: Diagnosis not present

## 2020-10-06 DIAGNOSIS — D72829 Elevated white blood cell count, unspecified: Secondary | ICD-10-CM | POA: Diagnosis not present

## 2020-10-06 DIAGNOSIS — Z79899 Other long term (current) drug therapy: Secondary | ICD-10-CM

## 2020-10-06 DIAGNOSIS — E039 Hypothyroidism, unspecified: Secondary | ICD-10-CM | POA: Diagnosis not present

## 2020-10-07 LAB — CBC WITH DIFFERENTIAL/PLATELET
Absolute Monocytes: 782 cells/uL (ref 200–950)
Basophils Absolute: 58 cells/uL (ref 0–200)
Basophils Relative: 0.5 %
Eosinophils Absolute: 161 cells/uL (ref 15–500)
Eosinophils Relative: 1.4 %
HCT: 41.6 % (ref 35.0–45.0)
Hemoglobin: 13.8 g/dL (ref 11.7–15.5)
Lymphs Abs: 3726 cells/uL (ref 850–3900)
MCH: 29.4 pg (ref 27.0–33.0)
MCHC: 33.2 g/dL (ref 32.0–36.0)
MCV: 88.5 fL (ref 80.0–100.0)
MPV: 10 fL (ref 7.5–12.5)
Monocytes Relative: 6.8 %
Neutro Abs: 6774 cells/uL (ref 1500–7800)
Neutrophils Relative %: 58.9 %
Platelets: 314 10*3/uL (ref 140–400)
RBC: 4.7 10*6/uL (ref 3.80–5.10)
RDW: 12.9 % (ref 11.0–15.0)
Total Lymphocyte: 32.4 %
WBC: 11.5 10*3/uL — ABNORMAL HIGH (ref 3.8–10.8)

## 2020-10-07 LAB — COMPLETE METABOLIC PANEL WITH GFR
AG Ratio: 1.9 (calc) (ref 1.0–2.5)
ALT: 14 U/L (ref 6–29)
AST: 17 U/L (ref 10–35)
Albumin: 4.6 g/dL (ref 3.6–5.1)
Alkaline phosphatase (APISO): 67 U/L (ref 37–153)
BUN/Creatinine Ratio: 28 (calc) — ABNORMAL HIGH (ref 6–22)
BUN: 27 mg/dL — ABNORMAL HIGH (ref 7–25)
CO2: 28 mmol/L (ref 20–32)
Calcium: 10.1 mg/dL (ref 8.6–10.4)
Chloride: 102 mmol/L (ref 98–110)
Creat: 0.95 mg/dL (ref 0.50–0.99)
GFR, Est African American: 74 mL/min/{1.73_m2} (ref >=60)
GFR, Est Non African American: 64 mL/min/{1.73_m2} (ref >=60)
Globulin: 2.4 g/dL (calc) (ref 1.9–3.7)
Glucose, Bld: 93 mg/dL (ref 65–99)
Potassium: 4.3 mmol/L (ref 3.5–5.3)
Sodium: 141 mmol/L (ref 135–146)
Total Bilirubin: 0.4 mg/dL (ref 0.2–1.2)
Total Protein: 7 g/dL (ref 6.1–8.1)

## 2020-10-07 LAB — IRON,TIBC AND FERRITIN PANEL
%SAT: 12 % (calc) — ABNORMAL LOW (ref 16–45)
Ferritin: 122 ng/mL (ref 16–288)
Iron: 38 ug/dL — ABNORMAL LOW (ref 45–160)
TIBC: 325 mcg/dL (calc) (ref 250–450)

## 2020-10-07 LAB — LIPID PANEL
Cholesterol: 192 mg/dL (ref <200)
HDL: 46 mg/dL — ABNORMAL LOW (ref >=50)
LDL Cholesterol (Calc): 109 mg/dL (calc) — ABNORMAL HIGH
Non-HDL Cholesterol (Calc): 146 mg/dL (calc) — ABNORMAL HIGH (ref <130)
Total CHOL/HDL Ratio: 4.2 (calc) (ref <5.0)
Triglycerides: 259 mg/dL — ABNORMAL HIGH (ref <150)

## 2020-10-07 LAB — MAGNESIUM: Magnesium: 2.6 mg/dL — ABNORMAL HIGH (ref 1.5–2.5)

## 2020-10-07 LAB — TSH: TSH: 3.19 mIU/L (ref 0.40–4.50)

## 2020-10-13 ENCOUNTER — Encounter: Payer: Self-pay | Admitting: Nurse Practitioner

## 2020-11-01 ENCOUNTER — Other Ambulatory Visit: Payer: Self-pay | Admitting: Internal Medicine

## 2020-11-01 DIAGNOSIS — M62830 Muscle spasm of back: Secondary | ICD-10-CM | POA: Diagnosis not present

## 2020-11-01 DIAGNOSIS — M9902 Segmental and somatic dysfunction of thoracic region: Secondary | ICD-10-CM | POA: Diagnosis not present

## 2020-11-01 DIAGNOSIS — M9906 Segmental and somatic dysfunction of lower extremity: Secondary | ICD-10-CM | POA: Diagnosis not present

## 2020-11-01 DIAGNOSIS — M9901 Segmental and somatic dysfunction of cervical region: Secondary | ICD-10-CM | POA: Diagnosis not present

## 2020-11-01 DIAGNOSIS — M9903 Segmental and somatic dysfunction of lumbar region: Secondary | ICD-10-CM | POA: Diagnosis not present

## 2020-11-03 ENCOUNTER — Other Ambulatory Visit: Payer: Self-pay | Admitting: Cardiology

## 2020-11-03 NOTE — Telephone Encounter (Signed)
Prescription refill request for Eliquis received. Indication: afib Last office visit: 07/21/20 Dr Curt Bears Scr: 0.95 (10/06/20) Age: 60 Weight: 109.8kg

## 2020-11-29 ENCOUNTER — Other Ambulatory Visit: Payer: Self-pay | Admitting: Internal Medicine

## 2020-11-29 DIAGNOSIS — M62830 Muscle spasm of back: Secondary | ICD-10-CM | POA: Diagnosis not present

## 2020-11-29 DIAGNOSIS — M9906 Segmental and somatic dysfunction of lower extremity: Secondary | ICD-10-CM | POA: Diagnosis not present

## 2020-11-29 DIAGNOSIS — M9901 Segmental and somatic dysfunction of cervical region: Secondary | ICD-10-CM | POA: Diagnosis not present

## 2020-11-29 DIAGNOSIS — M9903 Segmental and somatic dysfunction of lumbar region: Secondary | ICD-10-CM | POA: Diagnosis not present

## 2020-11-29 DIAGNOSIS — M9902 Segmental and somatic dysfunction of thoracic region: Secondary | ICD-10-CM | POA: Diagnosis not present

## 2020-12-14 DIAGNOSIS — L293 Anogenital pruritus, unspecified: Secondary | ICD-10-CM | POA: Diagnosis not present

## 2020-12-14 DIAGNOSIS — N951 Menopausal and female climacteric states: Secondary | ICD-10-CM | POA: Diagnosis not present

## 2020-12-22 ENCOUNTER — Other Ambulatory Visit: Payer: Self-pay | Admitting: Internal Medicine

## 2020-12-22 ENCOUNTER — Other Ambulatory Visit: Payer: Self-pay | Admitting: Adult Health

## 2020-12-22 ENCOUNTER — Other Ambulatory Visit: Payer: Self-pay | Admitting: Cardiology

## 2020-12-22 DIAGNOSIS — L409 Psoriasis, unspecified: Secondary | ICD-10-CM

## 2020-12-28 DIAGNOSIS — M9903 Segmental and somatic dysfunction of lumbar region: Secondary | ICD-10-CM | POA: Diagnosis not present

## 2020-12-28 DIAGNOSIS — M9906 Segmental and somatic dysfunction of lower extremity: Secondary | ICD-10-CM | POA: Diagnosis not present

## 2020-12-28 DIAGNOSIS — M9901 Segmental and somatic dysfunction of cervical region: Secondary | ICD-10-CM | POA: Diagnosis not present

## 2020-12-28 DIAGNOSIS — M62830 Muscle spasm of back: Secondary | ICD-10-CM | POA: Diagnosis not present

## 2020-12-28 DIAGNOSIS — M9902 Segmental and somatic dysfunction of thoracic region: Secondary | ICD-10-CM | POA: Diagnosis not present

## 2021-01-02 ENCOUNTER — Ambulatory Visit: Payer: BC Managed Care – PPO | Admitting: Internal Medicine

## 2021-01-11 ENCOUNTER — Encounter: Payer: Self-pay | Admitting: Internal Medicine

## 2021-01-11 ENCOUNTER — Ambulatory Visit: Payer: BC Managed Care – PPO | Admitting: Internal Medicine

## 2021-01-11 ENCOUNTER — Other Ambulatory Visit: Payer: Self-pay

## 2021-01-11 VITALS — BP 136/80 | HR 67 | Temp 97.8°F | Resp 17 | Ht 66.0 in | Wt 235.2 lb

## 2021-01-11 DIAGNOSIS — E782 Mixed hyperlipidemia: Secondary | ICD-10-CM

## 2021-01-11 DIAGNOSIS — R7309 Other abnormal glucose: Secondary | ICD-10-CM

## 2021-01-11 DIAGNOSIS — Z79899 Other long term (current) drug therapy: Secondary | ICD-10-CM | POA: Diagnosis not present

## 2021-01-11 DIAGNOSIS — E039 Hypothyroidism, unspecified: Secondary | ICD-10-CM | POA: Diagnosis not present

## 2021-01-11 DIAGNOSIS — I10 Essential (primary) hypertension: Secondary | ICD-10-CM

## 2021-01-11 DIAGNOSIS — E559 Vitamin D deficiency, unspecified: Secondary | ICD-10-CM | POA: Diagnosis not present

## 2021-01-11 DIAGNOSIS — I480 Paroxysmal atrial fibrillation: Secondary | ICD-10-CM

## 2021-01-11 NOTE — Patient Instructions (Signed)

## 2021-01-11 NOTE — Progress Notes (Signed)
Future Appointments  Date Time Provider [place]  01/11/2021  4:00 PM Unk Pinto, MD [facility] None  05/04/2021    - CPE   3:00 PM Liane Comber, NP [facility] None    History of Present Illness:       This very nice 60 y.o. DWF  presents for follow up with HTN, HLD, Pre-Diabetes and Vitamin D Deficiency.        Patient is treated for HTN & BP has been controlled at home. Today's BP is at goal -  136/80. Patient has had no complaints of any cardiac type chest pain, palpitations, dyspnea / orthopnea / PND, dizziness, claudication, or dependent edema.       Hyperlipidemia is controlled with diet & meds. Patient denies myalgias or other med SE's. Last Lipids were  Lab Results  Component Value Date   CHOL 192 10/06/2020   HDL 46 (L) 10/06/2020   LDLCALC 109 (H) 10/06/2020   TRIG 259 (H) 10/06/2020   CHOLHDL 4.2 10/06/2020     Also, the patient has history of T2_NIDDM PreDiabetes and has had no symptoms of reactive hypoglycemia, diabetic polys, paresthesias or visual blurring.  Last A1c was near goal:  Lab Results  Component Value Date   HGBA1C 5.7 (H) 05/04/2020        Further, the patient also has history of Vitamin D Deficiency and supplements vitamin D without any suspected side-effects. Last vitamin D was at goal:   Lab Results  Component Value Date   VD25OH 83 01/27/2020     Current Outpatient Medications on File Prior to Visit  Medication Sig   apixaban (ELIQUIS) 5 MG TABS tablet TAKE 1 TABLET BY MOUTH TWICE A DAY   Ascorbic Acid (VITAMIN C) 500 MG CHEW Chew by mouth. 2 times per day    augmented betamethasone dipropionate (DIPROLENE-AF) 0.05 % cream APPLY VERY SPARINGLY TO PSORIASIS RASH (DO NOT USE ON FACE ! )   Black Pepper-Turmeric 3-500 MG CAPS Take by mouth.   Calcium 600-400 MG-UNIT CHEW Chew 1 each by mouth 2 (two) times daily.    Cholecalciferol (VITAMIN D PO) Take 10,000 Units by mouth daily.   Coenzyme Q10 (CO Q10) 100 MG  CAPS Take 100 mg by mouth daily.    cromolyn (NASALCROM) 5.2 MG/ACT nasal spray Place 1 spray into both nostrils in the morning and at bedtime.   cyclobenzaprine (FLEXERIL) 5 MG tablet TAKE 1 TABLET BY MOUTH 3 X /DAY AS NEEDED FOR MUSCLE SPASM   diltiazem (CARDIZEM CD) 120 MG 24 hr capsule TAKE 1 CAPSULE BY MOUTH EVERY DAY   DIVIGEL 0.5 MG/0.5GM GEL SMARTSIG:1 Packet(s) T-DERMAL Daily   ezetimibe (ZETIA) 10 MG tablet TAKE 1 TABLET DAILY FOR CHOLESTREROL   ferrous sulfate 324 MG TBEC Take 324 mg by mouth.   flecainide (TAMBOCOR) 100 MG tablet Take  1 tablet  2 x /day (every 12 hours)  for Afib   furosemide (LASIX) 20 MG tablet Take 1 tablet Daily for Fluid Retention   levothyroxine (SYNTHROID) 50 MCG tablet TAKE 1 TABLET DAILY ON EMPTY STOMACH WITH ONLY WATER FOR 30 MIN (NO ANTACID MEDS, CALCIUM OR MAGNESIUM FOR 4 HOURS & AVOID BIOTIN)   Magnesium 500 MG TABS Take 500 mg by mouth every evening.    montelukast (SINGULAIR) 10 MG tablet TAKE 1 TABLET BY MOUTH DAILY FOR ALLERGIES   Multiple Vitamin (MULTIVITAMIN WITH MINERALS) TABS tablet Take 1 tablet by mouth daily.   OVER THE  COUNTER MEDICATION in the morning and at bedtime. Taking Ferrous Sulfate 325 mg daily.   Polyethyl Glycol-Propyl Glycol (SYSTANE OP) Place 1 drop into both eyes daily.   Probiotic Product (PROBIOTIC DAILY PO) Take 1 tablet by mouth daily.    progesterone (PROMETRIUM) 100 MG capsule TAKE 1 CAPSULE BY MOUTH EVERY DAY     Allergies  Allergen Reactions   Lipitor [Atorvastatin] Other (See Comments)    "neck pain"   Metoprolol Other (See Comments)    DIDN'T WORK FOR PATIENT AND GAINED WEIGHT   Phentermine Other (See Comments)    Did not help, Insomnia   Rythmol [Propafenone] Other (See Comments)    "weight gain"   Sulfa Drugs Cross Reactors Other (See Comments)    "soreness all over"   Lisinopril Cough     PMHx:   Past Medical History:  Diagnosis Date   Arthritis    "probably in my knees; maybe in my hands"  (04/11/2017)   Asthma attack 10/2015 X 1   Atrial flutter ([place]) 10/2015   Atrial flutter with rapid ventricular response ([place]) 10/20/2015   Heart murmur    "comes and goes" (04/11/2017)   Hepatitis 1972   "don't know what kind"   High cholesterol    Hypertension    Hypothyroidism    Malignant melanoma ([place]) 10/1975   R leg as a teen   Malignant melanoma of leg ([place])    "right thigh"   PAF (paroxysmal atrial fibrillation) ([place])    Pneumonia 1966; 1967   "left lung collapsed one of these times"   Psoriasis    Rosacea    Vitamin D deficiency      Immunization History  Administered Date(s) Administered   PFIZER SARS-COV-2 Vacc 06/29/2019, 07/20/2019, 03/16/2020   PPD Test 11/27/2017, 04/14/2019   Tdap 06/23/2015     Past Surgical History:  Procedure Laterality Date   A-FLUTTER ABLATION N/A 02/08/2017   Procedure: A-Flutter Ablation;  Surgeon: Constance Haw, MD;  Location: [place] [facility];  Service: Cardiovascular;  Laterality: N/A;   ATRIAL FIBRILLATION ABLATION N/A 04/11/2017   Procedure: ATRIAL FIBRILLATION ABLATION;  Surgeon: Constance Haw, MD;  Location: [place] [facility];  Service: Cardiovascular;  Laterality: N/A;   CARDIOVERSION N/A 10/25/2015   Procedure: CARDIOVERSION;  Surgeon: Sueanne Margarita, MD;  Location: [facility]COPY;  Service: Cardiovascular;  Laterality: N/A;   [place]   ELECTROPHYSIOLOGIC STUDY N/A 01/13/2016   Procedure: Atrial Fibrillation Ablation;  Surgeon: Will Meredith Leeds, MD;  Location: [facility];  Service: Cardiovascular;  Laterality: N/A;   ENDOMETRIAL ABLATION  ~ 2012   HYSTEROSCOPY  03/28/2020   Dr. Lanny Cramp   MELANOMA EXCISION Right 10/1975   outer thigh "malignant"   REFRACTIVE SURGERY Bilateral 1990s   TEE WITHOUT CARDIOVERSION N/A 10/25/2015   Procedure: TRANSESOPHAGEAL ECHOCARDIOGRAM (TEE);  Surgeon: Sueanne Margarita, MD;  Location: [facility] ENDOSCOPY;   Service: Cardiovascular;  Laterality: N/A;   TEE WITHOUT CARDIOVERSION N/A 01/11/2016   Procedure: TRANSESOPHAGEAL ECHOCARDIOGRAM (TEE);  Surgeon: Josue Hector, MD;  Location: [facility] ENDOSCOPY;  Service: Cardiovascular;  Laterality: N/A;   TONSILLECTOMY  1967   TUBAL LIGATION      FHx:    Reviewed / unchanged  SHx:    Reviewed / unchanged   Systems Review:  Constitutional: Denies fever, chills, wt changes, headaches, insomnia, fatigue, night sweats, change in appetite. Eyes: Denies redness, blurred vision, diplopia, discharge,  itchy, watery eyes.  ENT: Denies discharge, congestion, post nasal drip, epistaxis, sore throat, earache, hearing loss, dental pain, tinnitus, vertigo, sinus pain, snoring.  CV: Denies chest pain, palpitations, irregular heartbeat, syncope, dyspnea, diaphoresis, orthopnea, PND, claudication or edema. Respiratory: denies cough, dyspnea, DOE, pleurisy, hoarseness, laryngitis, wheezing.  Gastrointestinal: Denies dysphagia, odynophagia, heartburn, reflux, water brash, abdominal pain or cramps, nausea, vomiting, bloating, diarrhea, constipation, hematemesis, melena, hematochezia  or hemorrhoids. Genitourinary: Denies dysuria, frequency, urgency, nocturia, hesitancy, discharge, hematuria or flank pain. Musculoskeletal: Denies arthralgias, myalgias, stiffness, jt. swelling, pain, limping or strain/sprain.  Skin: Denies pruritus, rash, hives, warts, acne, eczema or change in skin lesion(s). Neuro: No weakness, tremor, incoordination, spasms, paresthesia or pain. Psychiatric: Denies confusion, memory loss or sensory loss. Endo: Denies change in weight, skin or hair change.  Heme/Lymph: No excessive bleeding, bruising or enlarged lymph nodes.  Physical Exam  BP 136/80   Pulse 67   Temp 97.8 F (36.6 C)   Resp 17   Ht 5\' 6"  (1.676 m)   Wt 235 lb 3.2 oz (106.7 kg)   LMP 10/29/2013 (Approximate)   SpO2 96%   BMI 37.96 kg/m   Appears  well nourished, well groomed  and  in no distress.  Eyes: PERRLA, EOMs, conjunctiva no swelling or erythema. Sinuses: No frontal/maxillary tenderness ENT/Mouth: EAC's clear, TM's nl w/o erythema, bulging. Nares clear w/o erythema, swelling, exudates. Oropharynx clear without erythema or exudates. Oral hygiene is good. Tongue normal, non obstructing. Hearing intact.  Neck: Supple. Thyroid not palpable. Car 2+/2+ without bruits, nodes or JVD. Chest: Respirations nl with BS clear & equal w/o rales, rhonchi, wheezing or stridor.  Cor: Heart sounds normal w/ regular rate and rhythm without sig. murmurs, gallops, clicks or rubs. Peripheral pulses normal and equal  without edema.  Abdomen: Soft & bowel sounds normal. Non-tender w/o guarding, rebound, hernias, masses or organomegaly.  Lymphatics: Unremarkable.  Musculoskeletal: Full ROM all peripheral extremities, joint stability, 5/5 strength and normal gait.  Skin: Warm, dry without exposed rashes, lesions or ecchymosis apparent.  Neuro: Cranial nerves intact, reflexes equal bilaterally. Sensory-motor testing grossly intact. Tendon reflexes grossly intact.  Pysch: Alert & oriented x 3.  Insight and judgement nl & appropriate. No ideations.  Assessment and Plan:  1. Essential hypertension  - Continue medication, monitor blood pressure at home.  - Continue DASH diet.  Reminder to go to the ER if any CP,  SOB, nausea, dizziness, severe HA, changes vision/speech.   - CBC with Differential/Platelet - COMPLETE METABOLIC PANEL WITH GFR - Magnesium - TSH  2. Hyperlipidemia, mixed  - Continue diet/meds, exercise,& lifestyle modifications.  - Continue monitor periodic cholesterol/liver & renal functions    - Lipid panel - TSH  3. Abnormal glucose  - Continue diet, exercise  - Lifestyle modifications.  - Monitor appropriate labs   - Hemoglobin A1c - Insulin, random  4. Vitamin D deficiency  - Continue supplementation.   - VITAMIN D 25 Hydroxy   5. Hypothyroidism  -  TSH  6. Paroxysmal atrial fibrillation (HCC)  - TSH  7. Medication management  - CBC with Differential/Platelet - COMPLETE METABOLIC PANEL WITH GFR - Magnesium - Lipid panel - TSH - Hemoglobin A1c - Insulin, random - VITAMIN D 25 Hydroxy          BMI  ~ 38 - Discussed at length  with patient to consider taking Mounjaro for weight loss.         Discussed  regular exercise, BP monitoring, weight control  to achieve/maintain BMI less than 25 and discussed med and SE's. Recommended labs to assess and monitor clinical status with further disposition pending results of labs.  I discussed the assessment and treatment plan with the patient. The patient was provided an opportunity to ask questions and all were answered. The patient agreed with the plan and demonstrated an understanding of the instructions.  I provided over 30 minutes of exam, counseling, chart review and  complex critical decision making.        The patient was advised to call back or seek an in-person evaluation if the symptoms worsen or if the condition fails to improve as anticipated.   Kirtland Bouchard, MD

## 2021-01-12 ENCOUNTER — Other Ambulatory Visit: Payer: Self-pay | Admitting: Internal Medicine

## 2021-01-12 DIAGNOSIS — D72829 Elevated white blood cell count, unspecified: Secondary | ICD-10-CM

## 2021-01-12 LAB — CBC WITH DIFFERENTIAL/PLATELET
Absolute Monocytes: 887 cells/uL (ref 200–950)
Basophils Absolute: 86 cells/uL (ref 0–200)
Basophils Relative: 0.6 %
Eosinophils Absolute: 315 cells/uL (ref 15–500)
Eosinophils Relative: 2.2 %
HCT: 41.8 % (ref 35.0–45.0)
Hemoglobin: 14.1 g/dL (ref 11.7–15.5)
Lymphs Abs: 3975 cells/uL — ABNORMAL HIGH (ref 850–3900)
MCH: 29.7 pg (ref 27.0–33.0)
MCHC: 33.7 g/dL (ref 32.0–36.0)
MCV: 88.2 fL (ref 80.0–100.0)
MPV: 10.5 fL (ref 7.5–12.5)
Monocytes Relative: 6.2 %
Neutro Abs: 9038 cells/uL — ABNORMAL HIGH (ref 1500–7800)
Neutrophils Relative %: 63.2 %
Platelets: 327 10*3/uL (ref 140–400)
RBC: 4.74 10*6/uL (ref 3.80–5.10)
RDW: 12.4 % (ref 11.0–15.0)
Total Lymphocyte: 27.8 %
WBC: 14.3 10*3/uL — ABNORMAL HIGH (ref 3.8–10.8)

## 2021-01-12 LAB — LIPID PANEL
Cholesterol: 172 mg/dL (ref <200)
HDL: 49 mg/dL — ABNORMAL LOW (ref >=50)
LDL Cholesterol (Calc): 89 mg/dL (calc)
Non-HDL Cholesterol (Calc): 123 mg/dL (calc) (ref <130)
Total CHOL/HDL Ratio: 3.5 (calc) (ref <5.0)
Triglycerides: 261 mg/dL — ABNORMAL HIGH (ref <150)

## 2021-01-12 LAB — COMPLETE METABOLIC PANEL WITH GFR
AG Ratio: 1.6 (calc) (ref 1.0–2.5)
ALT: 17 U/L (ref 6–29)
AST: 19 U/L (ref 10–35)
Albumin: 4.6 g/dL (ref 3.6–5.1)
Alkaline phosphatase (APISO): 74 U/L (ref 37–153)
BUN: 24 mg/dL (ref 7–25)
CO2: 26 mmol/L (ref 20–32)
Calcium: 9.9 mg/dL (ref 8.6–10.4)
Chloride: 100 mmol/L (ref 98–110)
Creat: 0.89 mg/dL (ref 0.50–1.05)
Globulin: 2.8 g/dL (calc) (ref 1.9–3.7)
Glucose, Bld: 95 mg/dL (ref 65–99)
Potassium: 4.2 mmol/L (ref 3.5–5.3)
Sodium: 139 mmol/L (ref 135–146)
Total Bilirubin: 0.4 mg/dL (ref 0.2–1.2)
Total Protein: 7.4 g/dL (ref 6.1–8.1)
eGFR: 73 mL/min/{1.73_m2} (ref >=60)

## 2021-01-12 LAB — TSH: TSH: 3.42 mIU/L (ref 0.40–4.50)

## 2021-01-12 LAB — INSULIN, RANDOM: Insulin: 15.5 u[IU]/mL

## 2021-01-12 LAB — HEMOGLOBIN A1C
Hgb A1c MFr Bld: 5.6 % of total Hgb (ref <5.7)
Mean Plasma Glucose: 114 mg/dL
eAG (mmol/L): 6.3 mmol/L

## 2021-01-12 LAB — MAGNESIUM: Magnesium: 2.5 mg/dL (ref 1.5–2.5)

## 2021-01-12 LAB — VITAMIN D 25 HYDROXY (VIT D DEFICIENCY, FRACTURES): Vit D, 25-Hydroxy: 85 ng/mL (ref 30–100)

## 2021-01-12 NOTE — Progress Notes (Signed)
============================================================ -   Test results slightly outside the reference range are not unusual. If there is anything important, I will review this with you,  otherwise it is considered normal test values.  If you have further questions,  please do not hesitate to contact me at the office or via My Chart.  ============================================================ ============================================================  -  CBC shows WBC remains persistently elevated                                        - Recommend Hematology Consultation ( requested )  ============================================================ ============================================================  -  Total Chol = 172  & LDL Chol = 89  - Both   Excellent   - Very low risk for Heart Attack  / Stroke ============================================================ ============================================================  -  But Triglycerides (   261   ) or fats in blood are too high  (goal is less than 150)    - Recommend avoid fried & greasy foods,  sweets / candy,   - Avoid white rice  (brown or wild rice or Quinoa is OK),   - Avoid white potatoes  (sweet potatoes are OK)   - Avoid anything made from white flour  - bagels, doughnuts, rolls, buns, biscuits, white and   wheat breads, pizza crust and traditional  pasta made of white flour & egg white  - (vegetarian pasta or spinach or wheat pasta is OK).    - Multi-grain bread is OK - like multi-grain flat bread or  sandwich thins.   - Avoid alcohol in excess.   - Exercise is also important. ============================================================ ============================================================  -  A1c - finally back in Normal Non Diabetic Range - Great ! ============================================================ ============================================================  -   Vitamin D = 85 - Excellent - Please keep dose same  ============================================================ ============================================================  -  All Else - CBC - Kidneys - Electrolytes - Liver - Magnesium & Thyroid    - all  Normal / OK ============================================================ ============================================================

## 2021-01-23 NOTE — Progress Notes (Signed)
[place]  [phone] [phone]  ID: Felecia Jan OB: 03/29/1961  MR#: [id number]  [id number]#:[id number]  Patient Care Team: Unk Pinto, MD as PCP - General (Internal Medicine) Constance Haw, MD as PCP - Cardiology (Cardiology) Constance Haw, MD as PCP - Electrophysiology (Cardiology)  CHIEF COMPLAINT: Leukocytosis.  INTERVAL HISTORY: Patient is a 60 year old female with a longstanding history of mild leukocytosis.  She currently feels well and is asymptomatic.  She denies any recent fevers or illnesses.  She has no new medications.  She has a good appetite and denies weight loss.  She has no neurologic complaints.  She denies any chest pain, shortness of breath, cough, or hemoptysis.  She denies any nausea, vomiting, constipation, or diarrhea.  She has no urinary complaints.  Patient feels at her baseline offers no specific complaints today.  REVIEW OF SYSTEMS:   Review of Systems  Constitutional: Negative.  Negative for fever, malaise/fatigue and weight loss.  Respiratory: Negative.  Negative for cough, hemoptysis and shortness of breath.   Cardiovascular: Negative.  Negative for chest pain and leg swelling.  Gastrointestinal: Negative.  Negative for abdominal pain.  Genitourinary: Negative.  Negative for dysuria.  Musculoskeletal: Negative.  Negative for back pain.  Skin: Negative.  Negative for rash.  Neurological: Negative.  Negative for dizziness, focal weakness, weakness and headaches.  Psychiatric/Behavioral: Negative.  The patient is not nervous/anxious.    As per HPI. Otherwise, a complete review of systems is negative.  PAST MEDICAL HISTORY: Past Medical History:  Diagnosis Date   Arthritis    "probably in my knees; maybe in my hands" (04/11/2017)   Asthma attack 10/2015 X 1   Atrial flutter ([place]) 10/2015   Atrial flutter with rapid ventricular response ([place]) 10/20/2015   Heart murmur    "comes and goes"  (04/11/2017)   Hepatitis 1972   "don't know what kind"   High cholesterol    Hypothyroidism    Malignant melanoma ([place]) 10/1975   R leg as a teen   Malignant melanoma of leg ([place])    "right thigh"   PAF (paroxysmal atrial fibrillation) ([place])    Pneumonia 1966; 1967   "left lung collapsed one of these times"   Psoriasis    Rosacea    Vitamin D deficiency     PAST SURGICAL HISTORY: Past Surgical History:  Procedure Laterality Date   A-FLUTTER ABLATION N/A 02/08/2017   Procedure: A-Flutter Ablation;  Surgeon: Constance Haw, MD;  Location: [place] [facility] LAB;  Service: Cardiovascular;  Laterality: N/A;   ATRIAL FIBRILLATION ABLATION N/A 04/11/2017   Procedure: ATRIAL FIBRILLATION ABLATION;  Surgeon: Constance Haw, MD;  Location: [place] [facility] LAB;  Service: Cardiovascular;  Laterality: N/A;   CARDIOVERSION N/A 10/25/2015   Procedure: CARDIOVERSION;  Surgeon: Sueanne Margarita, MD;  Location: [facility] [place] ENDOSCOPY;  Service: Cardiovascular;  Laterality: N/A;   [place]   ELECTROPHYSIOLOGIC STUDY N/A 01/13/2016   Procedure: Atrial Fibrillation Ablation;  Surgeon: Will Meredith Leeds, MD;  Location: [place] [facility] LAB;  Service: Cardiovascular;  Laterality: N/A;   ENDOMETRIAL ABLATION  ~ 2012   HYSTEROSCOPY  03/28/2020   Dr. Lanny Cramp   MELANOMA EXCISION Right 10/1975   outer thigh "malignant"   REFRACTIVE SURGERY Bilateral 1990s   TEE WITHOUT CARDIOVERSION N/A 10/25/2015   Procedure: TRANSESOPHAGEAL ECHOCARDIOGRAM (TEE);  Surgeon: Sueanne Margarita, MD;  Location: [place];  Service: Cardiovascular;  Laterality:  N/A;   TEE WITHOUT CARDIOVERSION N/A 01/11/2016   Procedure: TRANSESOPHAGEAL ECHOCARDIOGRAM (TEE);  Surgeon: Josue Hector, MD;  Location: [facility] ENDOSCOPY;  Service: Cardiovascular;  Laterality: N/A;   TONSILLECTOMY  1967   TUBAL LIGATION      FAMILY HISTORY: Family History  Problem Relation Age of Onset    Heart disease Mother    Hypertension Mother    Heart attack Mother    Heart disease Father    Diabetes Father    Hypertension Father    Heart attack Brother    Colon cancer Neg Hx    Stroke Neg Hx     ADVANCED DIRECTIVES (Y/N):  N  HEALTH MAINTENANCE: Social History   Tobacco Use   Smoking status: Never   Smokeless tobacco: Never  Vaping Use   Vaping Use: Never used  Substance Use Topics   Alcohol use: Yes    Comment: rare since covid, social   Drug use: No     Colonoscopy:  PAP:  Bone density:  Lipid panel:  Allergies  Allergen Reactions   Lipitor [Atorvastatin] Other (See Comments)    "neck pain"   Metoprolol Other (See Comments)    DIDN'T WORK FOR PATIENT AND GAINED WEIGHT MIGHT HAVE BEEN TAKEN WITH RYTHMOL   Phentermine Other (See Comments)    Did not help, Insomnia    Rythmol [Propafenone] Other (See Comments)    "weight gain"   Sulfa Drugs Cross Reactors Other (See Comments)    "soreness all over"   Lisinopril Cough    Current Outpatient Medications  Medication Sig Dispense Refill   apixaban (ELIQUIS) 5 MG TABS tablet TAKE 1 TABLET BY MOUTH TWICE A DAY 180 tablet 1   Ascorbic Acid (VITAMIN C) 500 MG CHEW Chew by mouth. 2 times per day      augmented betamethasone dipropionate (DIPROLENE-AF) 0.05 % cream APPLY VERY SPARINGLY TO PSORIASIS RASH (DO NOT USE ON FACE ! ) 50 g 5   Black Pepper-Turmeric 3-500 MG CAPS Take by mouth.     Calcium 600-400 MG-UNIT CHEW Chew 1 each by mouth 2 (two) times daily.      Cholecalciferol (VITAMIN D PO) Take 10,000 Units by mouth daily.     Coenzyme Q10 (CO Q10) 100 MG CAPS Take 100 mg by mouth daily.      cromolyn (NASALCROM) 5.2 MG/ACT nasal spray Place 1 spray into both nostrils in the morning and at bedtime.     cyclobenzaprine (FLEXERIL) 5 MG tablet TAKE 1 TABLET BY MOUTH 3 X /DAY AS NEEDED FOR MUSCLE SPASM 270 tablet 1   diltiazem (CARDIZEM CD) 120 MG 24 hr capsule TAKE 1 CAPSULE BY MOUTH EVERY DAY 90 capsule 2    DIVIGEL 0.5 MG/0.5GM GEL SMARTSIG:1 Packet(s) T-DERMAL Daily     ferrous sulfate 324 MG TBEC Take 324 mg by mouth.     flecainide (TAMBOCOR) 100 MG tablet Take  1 tablet  2 x /day (every 12 hours)  for Afib 180 tablet 3   furosemide (LASIX) 20 MG tablet Take 1 tablet Daily for Fluid Retention 90 tablet 1   levothyroxine (SYNTHROID) 50 MCG tablet TAKE 1 TABLET DAILY ON EMPTY STOMACH WITH ONLY WATER FOR 30 MIN (NO ANTACID MEDS, CALCIUM OR MAGNESIUM FOR 4 HOURS & AVOID BIOTIN) 270 tablet 1   Magnesium 500 MG TABS Take 500 mg by mouth every evening.      Melatonin 1 MG CHEW Chew by mouth. Prn     montelukast (SINGULAIR) 10 MG  tablet TAKE 1 TABLET BY MOUTH DAILY FOR ALLERGIES 90 tablet 3   Multiple Vitamin (MULTIVITAMIN WITH MINERALS) TABS tablet Take 1 tablet by mouth daily.     OVER THE COUNTER MEDICATION in the morning and at bedtime. Taking Ferrous Sulfate 325 mg daily.     Polyethyl Glycol-Propyl Glycol (SYSTANE OP) Place 1 drop into both eyes daily.     Probiotic Product (PROBIOTIC DAILY PO) Take 1 tablet by mouth daily.      progesterone (PROMETRIUM) 100 MG capsule TAKE 1 CAPSULE BY MOUTH EVERY DAY 30 capsule 1   psyllium (METAMUCIL) 58.6 % powder Take 1 packet by mouth. prn     ezetimibe (ZETIA) 10 MG tablet TAKE 1 TABLET DAILY FOR CHOLESTREROL 90 tablet 3   No current facility-administered medications for this visit.    OBJECTIVE: Vitals:   01/24/21 1455  BP: (!) 152/87  Pulse: 65  Resp: 18  Temp: (!) 97.5 F (36.4 C)  SpO2: 98%     Body mass index is 37.43 kg/m.    ECOG FS:0 - Asymptomatic  General: Well-developed, well-nourished, no acute distress. Eyes: Pink conjunctiva, anicteric sclera. HEENT: Normocephalic, moist mucous membranes. Lungs: No audible wheezing or coughing. Heart: Regular rate and rhythm. Abdomen: Soft, nontender, no obvious distention. Musculoskeletal: No edema, cyanosis, or clubbing. Neuro: Alert, answering all questions appropriately. Cranial nerves  grossly intact. Skin: No rashes or petechiae noted. Psych: Normal affect. Lymphatics: No cervical, calvicular, axillary or inguinal LAD.   LAB RESULTS:  Lab Results  Component Value Date   NA 139 01/11/2021   K 4.2 01/11/2021   CL 100 01/11/2021   CO2 26 01/11/2021   GLUCOSE 95 01/11/2021   BUN 24 01/11/2021   CREATININE 0.89 01/11/2021   CALCIUM 9.9 01/11/2021   PROT 7.4 01/11/2021   ALBUMIN 4.5 08/16/2016   AST 19 01/11/2021   ALT 17 01/11/2021   ALKPHOS 52 08/16/2016   BILITOT 0.4 01/11/2021   GFRNONAA 64 10/06/2020   GFRAA 74 10/06/2020    Lab Results  Component Value Date   WBC 12.5 (H) 01/24/2021   NEUTROABS 8.1 (H) 01/24/2021   HGB 14.2 01/24/2021   HCT 42.7 01/24/2021   MCV 88.4 01/24/2021   PLT 319 01/24/2021     STUDIES: No results found.  ASSESSMENT: Leukocytosis.  PLAN:    Leukocytosis: Upon review of patient's chart she has had a mildly elevated white blood cell count since at least October 2017 ranging from 10.8-14.6.  Results today are 12.5.  Have ordered flow cytometry and BCR-ABL mutation for completeness.  No intervention is needed at this time.  She does not require bone marrow biopsy.  Patient will have video-assisted telemedicine visit in 3 weeks to discuss the results.  I spent a total of 45 minutes reviewing chart data, face-to-face evaluation with the patient, counseling and coordination of care as detailed above.  Patient expressed understanding and was in agreement with this plan. She also understands that She can call clinic at any time with any questions, concerns, or complaints.    Lloyd Huger, MD   01/24/2021 4:00 PM

## 2021-01-24 ENCOUNTER — Inpatient Hospital Stay: Payer: BC Managed Care – PPO

## 2021-01-24 ENCOUNTER — Other Ambulatory Visit: Payer: Self-pay

## 2021-01-24 ENCOUNTER — Inpatient Hospital Stay: Payer: BC Managed Care – PPO | Attending: Oncology | Admitting: Oncology

## 2021-01-24 ENCOUNTER — Encounter: Payer: Self-pay | Admitting: Oncology

## 2021-01-24 VITALS — BP 152/87 | HR 65 | Temp 97.5°F | Resp 18 | Wt 231.9 lb

## 2021-01-24 DIAGNOSIS — Z833 Family history of diabetes mellitus: Secondary | ICD-10-CM | POA: Diagnosis not present

## 2021-01-24 DIAGNOSIS — D72829 Elevated white blood cell count, unspecified: Secondary | ICD-10-CM

## 2021-01-24 DIAGNOSIS — Z888 Allergy status to other drugs, medicaments and biological substances status: Secondary | ICD-10-CM | POA: Insufficient documentation

## 2021-01-24 DIAGNOSIS — Z8249 Family history of ischemic heart disease and other diseases of the circulatory system: Secondary | ICD-10-CM | POA: Diagnosis not present

## 2021-01-24 DIAGNOSIS — Z882 Allergy status to sulfonamides status: Secondary | ICD-10-CM | POA: Diagnosis not present

## 2021-01-24 DIAGNOSIS — Z7901 Long term (current) use of anticoagulants: Secondary | ICD-10-CM | POA: Diagnosis not present

## 2021-01-24 DIAGNOSIS — E039 Hypothyroidism, unspecified: Secondary | ICD-10-CM | POA: Diagnosis not present

## 2021-01-24 DIAGNOSIS — Z79899 Other long term (current) drug therapy: Secondary | ICD-10-CM | POA: Insufficient documentation

## 2021-01-24 DIAGNOSIS — Z85820 Personal history of malignant melanoma of skin: Secondary | ICD-10-CM | POA: Insufficient documentation

## 2021-01-24 DIAGNOSIS — I480 Paroxysmal atrial fibrillation: Secondary | ICD-10-CM | POA: Insufficient documentation

## 2021-01-24 LAB — CBC WITH DIFFERENTIAL/PLATELET
Abs Immature Granulocytes: 0.05 10*3/uL (ref 0.00–0.07)
Basophils Absolute: 0.1 10*3/uL (ref 0.0–0.1)
Basophils Relative: 1 %
Eosinophils Absolute: 0.2 10*3/uL (ref 0.0–0.5)
Eosinophils Relative: 1 %
HCT: 42.7 % (ref 36.0–46.0)
Hemoglobin: 14.2 g/dL (ref 12.0–15.0)
Immature Granulocytes: 0 %
Lymphocytes Relative: 27 %
Lymphs Abs: 3.4 10*3/uL (ref 0.7–4.0)
MCH: 29.4 pg (ref 26.0–34.0)
MCHC: 33.3 g/dL (ref 30.0–36.0)
MCV: 88.4 fL (ref 80.0–100.0)
Monocytes Absolute: 0.8 10*3/uL (ref 0.1–1.0)
Monocytes Relative: 7 %
Neutro Abs: 8.1 10*3/uL — ABNORMAL HIGH (ref 1.7–7.7)
Neutrophils Relative %: 64 %
Platelets: 319 10*3/uL (ref 150–400)
RBC: 4.83 MIL/uL (ref 3.87–5.11)
RDW: 13 % (ref 11.5–15.5)
WBC: 12.5 10*3/uL — ABNORMAL HIGH (ref 4.0–10.5)
nRBC: 0 % (ref 0.0–0.2)

## 2021-01-24 LAB — FOLATE: Folate: 15.4 ng/mL (ref >5.9)

## 2021-01-24 LAB — IRON AND TIBC
Iron: 37 ug/dL (ref 28–170)
Saturation Ratios: 10 % — ABNORMAL LOW (ref 10.4–31.8)
TIBC: 358 ug/dL (ref 250–450)
UIBC: 321 ug/dL

## 2021-01-24 LAB — FERRITIN: Ferritin: 150 ng/mL (ref 11–307)

## 2021-01-24 LAB — VITAMIN B12: Vitamin B-12: 1134 pg/mL — ABNORMAL HIGH (ref 180–914)

## 2021-01-24 NOTE — Progress Notes (Signed)
Pt here for new patient visit. Pt reports memory issues and reports taking increasing doses of iron supplements as well as continuously elevated wbc.

## 2021-01-25 ENCOUNTER — Other Ambulatory Visit: Payer: Self-pay | Admitting: Internal Medicine

## 2021-01-25 DIAGNOSIS — M9901 Segmental and somatic dysfunction of cervical region: Secondary | ICD-10-CM | POA: Diagnosis not present

## 2021-01-25 DIAGNOSIS — M9902 Segmental and somatic dysfunction of thoracic region: Secondary | ICD-10-CM | POA: Diagnosis not present

## 2021-01-25 DIAGNOSIS — M9906 Segmental and somatic dysfunction of lower extremity: Secondary | ICD-10-CM | POA: Diagnosis not present

## 2021-01-25 DIAGNOSIS — Z1231 Encounter for screening mammogram for malignant neoplasm of breast: Secondary | ICD-10-CM

## 2021-01-25 DIAGNOSIS — M62830 Muscle spasm of back: Secondary | ICD-10-CM | POA: Diagnosis not present

## 2021-01-25 DIAGNOSIS — M9903 Segmental and somatic dysfunction of lumbar region: Secondary | ICD-10-CM | POA: Diagnosis not present

## 2021-01-26 LAB — COMP PANEL: LEUKEMIA/LYMPHOMA

## 2021-02-03 ENCOUNTER — Encounter: Payer: Self-pay | Admitting: Adult Health

## 2021-02-03 ENCOUNTER — Ambulatory Visit: Payer: BC Managed Care – PPO | Admitting: Adult Health

## 2021-02-03 ENCOUNTER — Other Ambulatory Visit: Payer: Self-pay

## 2021-02-03 VITALS — BP 156/76 | HR 61 | Temp 97.2°F | Wt 235.8 lb

## 2021-02-03 DIAGNOSIS — L409 Psoriasis, unspecified: Secondary | ICD-10-CM

## 2021-02-03 DIAGNOSIS — D72829 Elevated white blood cell count, unspecified: Secondary | ICD-10-CM | POA: Diagnosis not present

## 2021-02-03 MED ORDER — CLOBETASOL PROPIONATE 0.05 % EX SOLN: 1.0000 "application " | Freq: Two times a day (BID) | CUTANEOUS | 0 refills | Status: DC

## 2021-02-03 MED ORDER — DOXYCYCLINE HYCLATE 100 MG PO CAPS: ORAL_CAPSULE | ORAL | 0 refills | Status: DC

## 2021-02-03 MED ORDER — BETAMETHASONE DIPROPIONATE AUG 0.05 % EX CREA: TOPICAL_CREAM | CUTANEOUS | 5 refills | Status: AC

## 2021-02-03 NOTE — Progress Notes (Signed)
Assessment and Plan:  Monica Morgan was seen today for mass.  Diagnoses and all orders for this visit:  Psoriasis No concerning nodule/abscess; normal variant pronounced occiput with some skin thickening likely secondary to psoriasis;  Patient reassured; can monitor, follow up if new progressive sx Will refill steroid,  -     augmented betamethasone dipropionate (DIPROLENE-AF) 0.05 % cream; APPLY VERY SPARINGLY TO PSORIASIS RASH (DO NOT USE ON FACE ! ) -     clobetasol (TEMOVATE) 0.05 % external solution; Apply 1 application topically 2 (two) times daily to scalp.  Leukocytosis, unspecified type Pending follow up with Dr. Irene Limbo, workup thus far benign/unremarkable, has upcoming follow up to discuss in detail. Patient reassured.   Further disposition pending results of labs. Discussed med's effects and SE's.   Over 20 minutes of exam, counseling, chart review, and critical decision making was performed.   Future Appointments  Date Time Provider [place]  02/14/2021  2:30 PM Lloyd Huger, MD [facility] None  03/01/2021  8:10 AM [facility] MM 3 [facility]  05/04/2021  3:00 PM Liane Comber, NP [facility] None    ------------------------------------------------------------------------------------------------------------------   HPI BP (!) 156/76   Pulse 61   Temp (!) 97.2 F (36.2 C)   Wt 235 lb 12.8 oz (107 kg)   LMP 10/29/2013 (Approximate)   SpO2 99%   BMI 38.06 kg/m  60 y.o.female presents for evaluation of lump on head. She has scalp psoriasis and reports noted when she was scratching. Denies tenderness, discharge, fever/chills.   She has mild leukocytosis persistent since 2017, recently evaluated by hematology with benign workup.  CBC Latest Ref Rng & Units 01/24/2021 01/11/2021 10/06/2020  WBC 4.0 - 10.5 K/uL 12.5(H) 14.3(H) 11.5(H)  Hemoglobin 12.0 - 15.0 g/dL 14.2 14.1 13.8  Hematocrit 36.0 - 46.0 % 42.7 41.8 41.6  Platelets 150 - 400 K/uL 319 327  314     Past Medical History:  Diagnosis Date   Arthritis    "probably in my knees; maybe in my hands" (04/11/2017)   Asthma attack 10/2015 X 1   Atrial flutter ([place]) 10/2015   Atrial flutter with rapid ventricular response ([place]) 10/20/2015   Heart murmur    "comes and goes" (04/11/2017)   Hepatitis 1972   "don't know what kind"   High cholesterol    Hypothyroidism    Malignant melanoma ([place]) 10/1975   R leg as a teen   Malignant melanoma of leg ([place])    "right thigh"   PAF (paroxysmal atrial fibrillation) ([place])    Pneumonia 1966; 1967   "left lung collapsed one of these times"   Psoriasis    Rosacea    Vitamin D deficiency      Allergies  Allergen Reactions   Lipitor [Atorvastatin] Other (See Comments)    "neck pain"   Metoprolol Other (See Comments)    DIDN'T WORK FOR PATIENT AND GAINED WEIGHT MIGHT HAVE BEEN TAKEN WITH RYTHMOL   Phentermine Other (See Comments)    Did not help, Insomnia    Rythmol [Propafenone] Other (See Comments)    "weight gain"   Sulfa Drugs Cross Reactors Other (See Comments)    "soreness all over"   Lisinopril Cough    Current Outpatient Medications on File Prior to Visit  Medication Sig   apixaban (ELIQUIS) 5 MG TABS tablet TAKE 1 TABLET BY MOUTH TWICE A DAY   Ascorbic Acid (VITAMIN C) 500 MG CHEW Chew by mouth. 2 times per day    [facility]  Pepper-Turmeric 3-500 MG CAPS Take by mouth.   Calcium 600-400 MG-UNIT CHEW Chew 1 each by mouth 2 (two) times daily.    Cholecalciferol (VITAMIN D PO) Take 10,000 Units by mouth daily.   Coenzyme Q10 (CO Q10) 100 MG CAPS Take 100 mg by mouth daily.    cromolyn (NASALCROM) 5.2 MG/ACT nasal spray Place 1 spray into both nostrils in the morning and at bedtime.   cyclobenzaprine (FLEXERIL) 5 MG tablet TAKE 1 TABLET BY MOUTH 3 X /DAY AS NEEDED FOR MUSCLE SPASM   diltiazem (CARDIZEM CD) 120 MG 24 hr capsule TAKE 1 CAPSULE BY MOUTH EVERY DAY   DIVIGEL 0.5 MG/0.5GM GEL SMARTSIG:1 Packet(s) T-DERMAL Daily    ezetimibe (ZETIA) 10 MG tablet TAKE 1 TABLET DAILY FOR CHOLESTREROL   ferrous sulfate 324 MG TBEC Take 324 mg by mouth.   flecainide (TAMBOCOR) 100 MG tablet Take  1 tablet  2 x /day (every 12 hours)  for Afib   furosemide (LASIX) 20 MG tablet Take 1 tablet Daily for Fluid Retention   levothyroxine (SYNTHROID) 50 MCG tablet TAKE 1 TABLET DAILY ON EMPTY STOMACH WITH ONLY WATER FOR 30 MIN (NO ANTACID MEDS, CALCIUM OR MAGNESIUM FOR 4 HOURS & AVOID BIOTIN)   Magnesium 500 MG TABS Take 500 mg by mouth every evening.    Melatonin 1 MG CHEW Chew by mouth. Prn   montelukast (SINGULAIR) 10 MG tablet TAKE 1 TABLET BY MOUTH DAILY FOR ALLERGIES   Multiple Vitamin (MULTIVITAMIN WITH MINERALS) TABS tablet Take 1 tablet by mouth daily.   OVER THE COUNTER MEDICATION in the morning and at bedtime. Taking Ferrous Sulfate 325 mg daily.   Polyethyl Glycol-Propyl Glycol (SYSTANE OP) Place 1 drop into both eyes daily.   Probiotic Product (PROBIOTIC DAILY PO) Take 1 tablet by mouth daily.    progesterone (PROMETRIUM) 100 MG capsule TAKE 1 CAPSULE BY MOUTH EVERY DAY   psyllium (METAMUCIL) 58.6 % powder Take 1 packet by mouth. prn   No current facility-administered medications on file prior to visit.    ROS: all negative except above.   Physical Exam:  BP (!) 156/76   Pulse 61   Temp (!) 97.2 F (36.2 C)   Wt 235 lb 12.8 oz (107 kg)   LMP 10/29/2013 (Approximate)   SpO2 99%   BMI 38.06 kg/m   General Appearance: Well nourished, in no apparent distress. Eyes: PERRLA, conjunctiva no swelling or erythema ENT/Mouth: Mask in place; Hearing normal.  Neck: Supple Respiratory: Respiratory effort normal Cardio: Appears well perfused Musculoskeletal: normal gait.  Skin: Warm, dry; posterior scalp with pronounced occiput at area of concern indicated by patient. No fluctuance, notable mass/nodule, tenderness. Some local skin thickening with mild psoriatic plaques scattered.  Neuro: Cranial nerves intact. Normal  muscle tone Psych: Awake and oriented X 3, anxious affect, Insight and Judgment appropriate.     Izora Ribas, NP 10:26 AM [facility]

## 2021-02-06 LAB — BCR-ABL1, CML/ALL, PCR, QUANT: Interpretation (BCRAL):: NEGATIVE

## 2021-02-09 NOTE — Progress Notes (Signed)
[place]  [phone] [phone]  ID: Monica Morgan OB: 1960-05-25  MR#: [id number]  YQM#:[id number]  Patient Care Team: Unk Pinto, MD as PCP - General (Internal Medicine) Constance Haw, MD as PCP - Cardiology (Cardiology) Constance Haw, MD as PCP - Electrophysiology (Cardiology)  I connected with Monica Morgan on 02/09/21 at  2:30 PM EDT by video enabled telemedicine visit and verified that I am speaking with the correct person using two identifiers.   I discussed the limitations, risks, security and privacy concerns of performing an evaluation and management service by telemedicine and the availability of in-person appointments. I also discussed with the patient that there may be a patient responsible charge related to this service. The patient expressed understanding and agreed to proceed.   Other persons participating in the visit and their role in the encounter: Patient, MD.  Patient's location: Home. Provider's location: Clinic.  CHIEF COMPLAINT: Leukocytosis.  INTERVAL HISTORY: Patient agreed to video assisted telemedicine visit for further evaluation and discussion of her laboratory results.  She continues to feel well and remains asymptomatic.  She denies any recent fevers or illnesses. She has a good appetite and denies weight loss.  She has no neurologic complaints.  She denies any chest pain, shortness of breath, cough, or hemoptysis.  She denies any nausea, vomiting, constipation, or diarrhea.  She has no urinary complaints.  Patient offers no specific complaints today.  REVIEW OF SYSTEMS:   Review of Systems  Constitutional: Negative.  Negative for fever, malaise/fatigue and weight loss.  Respiratory: Negative.  Negative for cough, hemoptysis and shortness of breath.   Cardiovascular: Negative.  Negative for chest pain and leg swelling.  Gastrointestinal: Negative.  Negative for abdominal pain.   Genitourinary: Negative.  Negative for dysuria.  Musculoskeletal: Negative.  Negative for back pain.  Skin: Negative.  Negative for rash.  Neurological: Negative.  Negative for dizziness, focal weakness, weakness and headaches.  Psychiatric/Behavioral: Negative.  The patient is not nervous/anxious.    As per HPI. Otherwise, a complete review of systems is negative.  PAST MEDICAL HISTORY: Past Medical History:  Diagnosis Date   Arthritis    "probably in my knees; maybe in my hands" (04/11/2017)   Asthma attack 10/2015 X 1   Atrial flutter ([place]) 10/2015   Atrial flutter with rapid ventricular response ([place]) 10/20/2015   Heart murmur    "comes and goes" (04/11/2017)   Hepatitis 1972   "don't know what kind"   High cholesterol    Hypothyroidism    Malignant melanoma ([place]) 10/1975   R leg as a teen   Malignant melanoma of leg ([place])    "right thigh"   PAF (paroxysmal atrial fibrillation) ([place])    Pneumonia 1966; 1967   "left lung collapsed one of these times"   Psoriasis    Rosacea    Vitamin D deficiency     PAST SURGICAL HISTORY: Past Surgical History:  Procedure Laterality Date   A-FLUTTER ABLATION N/A 02/08/2017   Procedure: A-Flutter Ablation;  Surgeon: Constance Haw, MD;  Location: [facility];  Service: Cardiovascular;  Laterality: N/A;   ATRIAL FIBRILLATION ABLATION N/A 04/11/2017   Procedure: ATRIAL FIBRILLATION ABLATION;  Surgeon: Constance Haw, MD;  Location: [place] [facility];  Service: Cardiovascular;  Laterality: N/A;   CARDIOVERSION N/A 10/25/2015   Procedure: CARDIOVERSION;  Surgeon: Sueanne Margarita, MD;  Location: [facility];  Service: Cardiovascular;  Laterality: N/A;   [place]  CESAREAN SECTION WITH BILATERAL TUBAL LIGATION  1982   ELECTROPHYSIOLOGIC STUDY N/A 01/13/2016   Procedure: Atrial Fibrillation Ablation;  Surgeon: Will Meredith Leeds, MD;  Location: [place];  Service: Cardiovascular;  Laterality: N/A;    ENDOMETRIAL ABLATION  ~ 2012   HYSTEROSCOPY  03/28/2020   Dr. Lanny Cramp   MELANOMA EXCISION Right 10/1975   outer thigh "malignant"   REFRACTIVE SURGERY Bilateral 1990s   TEE WITHOUT CARDIOVERSION N/A 10/25/2015   Procedure: TRANSESOPHAGEAL ECHOCARDIOGRAM (TEE);  Surgeon: Sueanne Margarita, MD;  Location: [facility];  Service: Cardiovascular;  Laterality: N/A;   TEE WITHOUT CARDIOVERSION N/A 01/11/2016   Procedure: TRANSESOPHAGEAL ECHOCARDIOGRAM (TEE);  Surgeon: Josue Hector, MD;  Location: [facility] ENDOSCOPY;  Service: Cardiovascular;  Laterality: N/A;   TONSILLECTOMY  1967   TUBAL LIGATION      FAMILY HISTORY: Family History  Problem Relation Age of Onset   Heart disease Mother    Hypertension Mother    Heart attack Mother    Heart disease Father    Diabetes Father    Hypertension Father    Heart attack Brother    Colon cancer Neg Hx    Stroke Neg Hx     ADVANCED DIRECTIVES (Y/N):  N  HEALTH MAINTENANCE: Social History   Tobacco Use   Smoking status: Never   Smokeless tobacco: Never  Vaping Use   Vaping Use: Never used  Substance Use Topics   Alcohol use: Yes    Comment: rare since covid, social   Drug use: No     Colonoscopy:  PAP:  Bone density:  Lipid panel:  Allergies  Allergen Reactions   Lipitor [Atorvastatin] Other (See Comments)    "neck pain"   Metoprolol Other (See Comments)    DIDN'T WORK FOR PATIENT AND GAINED WEIGHT MIGHT HAVE BEEN TAKEN WITH RYTHMOL   Phentermine Other (See Comments)    Did not help, Insomnia    Rythmol [Propafenone] Other (See Comments)    "weight gain"   Sulfa Drugs Cross Reactors Other (See Comments)    "soreness all over"   Lisinopril Cough    Current Outpatient Medications  Medication Sig Dispense Refill   apixaban (ELIQUIS) 5 MG TABS tablet TAKE 1 TABLET BY MOUTH TWICE A DAY 180 tablet 1   Ascorbic Acid (VITAMIN C) 500 MG CHEW Chew by mouth. 2 times per day      augmented betamethasone dipropionate (DIPROLENE-AF) 0.05 %  cream APPLY VERY SPARINGLY TO PSORIASIS RASH (DO NOT USE ON FACE ! ) 50 g 5   Black Pepper-Turmeric 3-500 MG CAPS Take by mouth.     Calcium 600-400 MG-UNIT CHEW Chew 1 each by mouth 2 (two) times daily.      Cholecalciferol (VITAMIN D PO) Take 10,000 Units by mouth daily.     clobetasol (TEMOVATE) 0.05 % external solution Apply 1 application topically 2 (two) times daily. 50 mL 0   Coenzyme Q10 (CO Q10) 100 MG CAPS Take 100 mg by mouth daily.      cromolyn (NASALCROM) 5.2 MG/ACT nasal spray Place 1 spray into both nostrils in the morning and at bedtime.     cyclobenzaprine (FLEXERIL) 5 MG tablet TAKE 1 TABLET BY MOUTH 3 X /DAY AS NEEDED FOR MUSCLE SPASM 270 tablet 1   diltiazem (CARDIZEM CD) 120 MG 24 hr capsule TAKE 1 CAPSULE BY MOUTH EVERY DAY 90 capsule 2   DIVIGEL 0.5 MG/0.5GM GEL SMARTSIG:1 Packet(s) T-DERMAL Daily     doxycycline (VIBRAMYCIN) 100 MG  capsule Take 1 capsule 2 x/day with food for 10 days. 20 capsule 0   ezetimibe (ZETIA) 10 MG tablet TAKE 1 TABLET DAILY FOR CHOLESTREROL 90 tablet 3   ferrous sulfate 324 MG TBEC Take 324 mg by mouth.     flecainide (TAMBOCOR) 100 MG tablet Take  1 tablet  2 x /day (every 12 hours)  for Afib 180 tablet 3   furosemide (LASIX) 20 MG tablet Take 1 tablet Daily for Fluid Retention 90 tablet 1   levothyroxine (SYNTHROID) 50 MCG tablet TAKE 1 TABLET DAILY ON EMPTY STOMACH WITH ONLY WATER FOR 30 MIN (NO ANTACID MEDS, CALCIUM OR MAGNESIUM FOR 4 HOURS & AVOID BIOTIN) 270 tablet 1   Magnesium 500 MG TABS Take 500 mg by mouth every evening.      Melatonin 1 MG CHEW Chew by mouth. Prn     montelukast (SINGULAIR) 10 MG tablet TAKE 1 TABLET BY MOUTH DAILY FOR ALLERGIES 90 tablet 3   Multiple Vitamin (MULTIVITAMIN WITH MINERALS) TABS tablet Take 1 tablet by mouth daily.     OVER THE COUNTER MEDICATION in the morning and at bedtime. Taking Ferrous Sulfate 325 mg daily.     Polyethyl Glycol-Propyl Glycol (SYSTANE OP) Place 1 drop into both eyes daily.      Probiotic Product (PROBIOTIC DAILY PO) Take 1 tablet by mouth daily.      progesterone (PROMETRIUM) 100 MG capsule TAKE 1 CAPSULE BY MOUTH EVERY DAY 30 capsule 1   psyllium (METAMUCIL) 58.6 % powder Take 1 packet by mouth. prn     No current facility-administered medications for this visit.    OBJECTIVE: There were no vitals filed for this visit.    There is no height or weight on file to calculate BMI.    ECOG FS:0 - Asymptomatic  General: Well-developed, well-nourished, no acute distress. HEENT: Normocephalic. Neuro: Alert, answering all questions appropriately. Cranial nerves grossly intact. Psych: Normal affect.   LAB RESULTS:  Lab Results  Component Value Date   NA 139 01/11/2021   K 4.2 01/11/2021   CL 100 01/11/2021   CO2 26 01/11/2021   GLUCOSE 95 01/11/2021   BUN 24 01/11/2021   CREATININE 0.89 01/11/2021   CALCIUM 9.9 01/11/2021   PROT 7.4 01/11/2021   ALBUMIN 4.5 08/16/2016   AST 19 01/11/2021   ALT 17 01/11/2021   ALKPHOS 52 08/16/2016   BILITOT 0.4 01/11/2021   GFRNONAA 64 10/06/2020   GFRAA 74 10/06/2020    Lab Results  Component Value Date   WBC 12.5 (H) 01/24/2021   NEUTROABS 8.1 (H) 01/24/2021   HGB 14.2 01/24/2021   HCT 42.7 01/24/2021   MCV 88.4 01/24/2021   PLT 319 01/24/2021     STUDIES: No results found.  ASSESSMENT: Leukocytosis.  PLAN:    Leukocytosis: Upon review of patient's chart she has had a mildly elevated white blood cell count since at least October 2017 ranging from 10.8-14.6.  Her most recent results on January 24, 2021 were reported at 12.5 with a mild neutrophilia.  Peripheral blood flow cytometry and BCR-ABL mutation are negative.  No intervention is needed at this time.  Patient does not require bone marrow biopsy.  After discussion with the patient, is agreed upon that no further follow-up is necessary.  Please refer patient back if there are any questions or concerns.     I provided 20 minutes of face-to-face video  visit time during this encounter which included chart review, counseling, and coordination of care  as documented above.   Patient expressed understanding and was in agreement with this plan. She also understands that She can call clinic at any time with any questions, concerns, or complaints.    Lloyd Huger, MD   02/09/2021 12:48 PM

## 2021-02-14 ENCOUNTER — Inpatient Hospital Stay: Payer: BC Managed Care – PPO | Attending: Oncology | Admitting: Oncology

## 2021-02-14 ENCOUNTER — Other Ambulatory Visit: Payer: Self-pay

## 2021-02-14 DIAGNOSIS — D72829 Elevated white blood cell count, unspecified: Secondary | ICD-10-CM | POA: Diagnosis not present

## 2021-02-14 NOTE — Progress Notes (Signed)
Pt confirmed availability for virtual visit as scheduled. Instructions given for how to log in through mychart. Has no new concerns at this time.

## 2021-02-22 DIAGNOSIS — M9901 Segmental and somatic dysfunction of cervical region: Secondary | ICD-10-CM | POA: Diagnosis not present

## 2021-02-22 DIAGNOSIS — M9903 Segmental and somatic dysfunction of lumbar region: Secondary | ICD-10-CM | POA: Diagnosis not present

## 2021-02-22 DIAGNOSIS — M62830 Muscle spasm of back: Secondary | ICD-10-CM | POA: Diagnosis not present

## 2021-02-22 DIAGNOSIS — M9906 Segmental and somatic dysfunction of lower extremity: Secondary | ICD-10-CM | POA: Diagnosis not present

## 2021-02-22 DIAGNOSIS — M9902 Segmental and somatic dysfunction of thoracic region: Secondary | ICD-10-CM | POA: Diagnosis not present

## 2021-02-28 ENCOUNTER — Ambulatory Visit: Payer: BC Managed Care – PPO

## 2021-03-01 ENCOUNTER — Other Ambulatory Visit: Payer: Self-pay

## 2021-03-01 ENCOUNTER — Ambulatory Visit
Admission: RE | Admit: 2021-03-01 | Discharge: 2021-03-01 | Disposition: A | Discharge status: Home / Self Care | Payer: BC Managed Care – PPO | Source: Ambulatory Visit | Attending: Internal Medicine | Admitting: Internal Medicine

## 2021-03-01 DIAGNOSIS — Z1231 Encounter for screening mammogram for malignant neoplasm of breast: Secondary | ICD-10-CM

## 2021-03-27 DIAGNOSIS — H35412 Lattice degeneration of retina, left eye: Secondary | ICD-10-CM | POA: Diagnosis not present

## 2021-03-27 DIAGNOSIS — H40013 Open angle with borderline findings, low risk, bilateral: Secondary | ICD-10-CM | POA: Diagnosis not present

## 2021-03-29 DIAGNOSIS — M9903 Segmental and somatic dysfunction of lumbar region: Secondary | ICD-10-CM | POA: Diagnosis not present

## 2021-03-29 DIAGNOSIS — M9906 Segmental and somatic dysfunction of lower extremity: Secondary | ICD-10-CM | POA: Diagnosis not present

## 2021-03-29 DIAGNOSIS — M62830 Muscle spasm of back: Secondary | ICD-10-CM | POA: Diagnosis not present

## 2021-03-29 DIAGNOSIS — M9902 Segmental and somatic dysfunction of thoracic region: Secondary | ICD-10-CM | POA: Diagnosis not present

## 2021-03-29 DIAGNOSIS — M9901 Segmental and somatic dysfunction of cervical region: Secondary | ICD-10-CM | POA: Diagnosis not present

## 2021-04-11 ENCOUNTER — Other Ambulatory Visit: Payer: Self-pay | Admitting: Adult Health

## 2021-04-20 DIAGNOSIS — M9903 Segmental and somatic dysfunction of lumbar region: Secondary | ICD-10-CM | POA: Diagnosis not present

## 2021-04-20 DIAGNOSIS — M9902 Segmental and somatic dysfunction of thoracic region: Secondary | ICD-10-CM | POA: Diagnosis not present

## 2021-04-20 DIAGNOSIS — M62830 Muscle spasm of back: Secondary | ICD-10-CM | POA: Diagnosis not present

## 2021-04-20 DIAGNOSIS — M5382 Other specified dorsopathies, cervical region: Secondary | ICD-10-CM | POA: Diagnosis not present

## 2021-04-20 DIAGNOSIS — M9901 Segmental and somatic dysfunction of cervical region: Secondary | ICD-10-CM | POA: Diagnosis not present

## 2021-04-27 DIAGNOSIS — Z6839 Body mass index (BMI) 39.0-39.9, adult: Secondary | ICD-10-CM | POA: Diagnosis not present

## 2021-04-27 DIAGNOSIS — Z01419 Encounter for gynecological examination (general) (routine) without abnormal findings: Secondary | ICD-10-CM | POA: Diagnosis not present

## 2021-05-03 DIAGNOSIS — M9906 Segmental and somatic dysfunction of lower extremity: Secondary | ICD-10-CM | POA: Diagnosis not present

## 2021-05-03 DIAGNOSIS — M9903 Segmental and somatic dysfunction of lumbar region: Secondary | ICD-10-CM | POA: Diagnosis not present

## 2021-05-03 DIAGNOSIS — M9902 Segmental and somatic dysfunction of thoracic region: Secondary | ICD-10-CM | POA: Diagnosis not present

## 2021-05-03 DIAGNOSIS — M9901 Segmental and somatic dysfunction of cervical region: Secondary | ICD-10-CM | POA: Diagnosis not present

## 2021-05-03 DIAGNOSIS — M62830 Muscle spasm of back: Secondary | ICD-10-CM | POA: Diagnosis not present

## 2021-05-04 ENCOUNTER — Encounter: Payer: Self-pay | Admitting: Adult Health

## 2021-05-04 ENCOUNTER — Other Ambulatory Visit: Payer: Self-pay

## 2021-05-04 ENCOUNTER — Ambulatory Visit (INDEPENDENT_AMBULATORY_CARE_PROVIDER_SITE_OTHER): Payer: BC Managed Care – PPO | Admitting: Adult Health

## 2021-05-04 VITALS — BP 128/76 | HR 77 | Temp 97.9°F | Resp 16 | Ht 66.0 in | Wt 234.8 lb

## 2021-05-04 DIAGNOSIS — E039 Hypothyroidism, unspecified: Secondary | ICD-10-CM

## 2021-05-04 DIAGNOSIS — Z13220 Encounter for screening for lipoid disorders: Secondary | ICD-10-CM

## 2021-05-04 DIAGNOSIS — I4819 Other persistent atrial fibrillation: Secondary | ICD-10-CM

## 2021-05-04 DIAGNOSIS — E782 Mixed hyperlipidemia: Secondary | ICD-10-CM

## 2021-05-04 DIAGNOSIS — Z0000 Encounter for general adult medical examination without abnormal findings: Secondary | ICD-10-CM

## 2021-05-04 DIAGNOSIS — Z79899 Other long term (current) drug therapy: Secondary | ICD-10-CM

## 2021-05-04 DIAGNOSIS — Z0001 Encounter for general adult medical examination with abnormal findings: Secondary | ICD-10-CM

## 2021-05-04 DIAGNOSIS — R7309 Other abnormal glucose: Secondary | ICD-10-CM

## 2021-05-04 DIAGNOSIS — E559 Vitamin D deficiency, unspecified: Secondary | ICD-10-CM

## 2021-05-04 DIAGNOSIS — Z1389 Encounter for screening for other disorder: Secondary | ICD-10-CM

## 2021-05-04 DIAGNOSIS — Z131 Encounter for screening for diabetes mellitus: Secondary | ICD-10-CM

## 2021-05-04 DIAGNOSIS — E042 Nontoxic multinodular goiter: Secondary | ICD-10-CM

## 2021-05-04 DIAGNOSIS — Z130 Encounter for screening for diseases of the blood and blood-forming organs and certain disorders involving the immune mechanism: Secondary | ICD-10-CM

## 2021-05-04 DIAGNOSIS — D649 Anemia, unspecified: Secondary | ICD-10-CM

## 2021-05-04 DIAGNOSIS — Z136 Encounter for screening for cardiovascular disorders: Secondary | ICD-10-CM

## 2021-05-04 DIAGNOSIS — Z85820 Personal history of malignant melanoma of skin: Secondary | ICD-10-CM

## 2021-05-04 DIAGNOSIS — I10 Essential (primary) hypertension: Secondary | ICD-10-CM

## 2021-05-04 DIAGNOSIS — R8290 Unspecified abnormal findings in urine: Secondary | ICD-10-CM

## 2021-05-04 NOTE — Progress Notes (Signed)
Complete Physical  Assessment and Plan:  Monica Morgan was seen today for annual exam.  Diagnoses and all orders for this visit:  Encounter for Annual Physical Exam with abnormal findings Due annually  Health Maintenance reviewed Healthy lifestyle reviewed and goals set  Schedule derm follow up, dental exams Request GYN forward PAP report once completed Continue annual mammogram   Essential hypertension Continue medications Monitor blood pressure at home; call if consistently over 130/80 Continue DASH diet.   Reminder to go to the ER if any CP, SOB, nausea, dizziness, severe HA, changes vision/speech, left arm numbness and tingling and jaw pain. -     CBC with Differential/Platelet -     COMPLETE METABOLIC PANEL WITH GFR -     Magnesium -     TSH -     Microalbumin / creatinine urine ratio -     Urinalysis, Routine w reflex microscopic  Persistent atrial fibrillation (HCC) Rate controlled; cardiology following; continue elequis   Atrial flutter with rapid ventricular response ([place]) Rate controlled; cardiology is following  Hypothyroidism, unspecified type continue medications the same pending lab results reminded to take on an empty stomach 30-24mins before food.  -     TSH  Multinodular thyroid Normal biopsy in 02/2020; no further follow up was recommended  Psoriasis of scalp Treat PRN; avoid triggers; has seen rheum  Morbid obesity ([place]) - BMI 37 with htn, hld Long discussion about weight loss, diet, and exercise Recommended diet heavy in fruits and veggies and low in animal meats, cheeses, and dairy products, appropriate calorie intake Patient will work on  Discussed appropriate weight for height  Follow up at next visit  Hyperlipidemia, mixed Mild elevations working on lifestyle Continue low cholesterol diet and exercise.  Check lipid panel.  -     Lipid panel -     TSH  Vitamin D deficiency Continue supplement; check annually  Defer as recently checked;    Medication management -     CBC with Differential/Platelet -     COMPLETE METABOLIC PANEL WITH GFR -     Magnesium  Abnormal glucose Discussed disease and risks Discussed diet/exercise, weight management  -     Hemoglobin A1c  Screening for diabetes mellitus -     Hemoglobin A1c  Screening for hematuria or proteinuria -     Microalbumin / creatinine urine ratio -     Urinalysis, Routine w reflex microscopic  Leukocytosis, unspecified type Has seen hematology, no furhter workup recommended  History of malignant melanoma Encouraged close follow up with derm No areas of concern today   Frequent hoarseness Secondary to allergic rhinits vs r/o silent reflux Advised trial of OTC nexium daily at night x 2 weeks, stop if no improvement Consider ENT if persistent/worsening for laryngoscopy   Orders Placed This Encounter  Procedures   CBC with Differential/Platelet   COMPLETE METABOLIC PANEL WITH GFR   Magnesium   Lipid panel   TSH   Hemoglobin A1c   Iron, TIBC and Ferritin Panel   Microalbumin / creatinine urine ratio   Urinalysis, Routine w reflex microscopic     Discussed med's effects and SE's. Screening labs and tests as requested with regular follow-up as recommended. Over 40 minutes of exam, counseling, chart review, and complex, high level critical decision making was performed this visit.   Future Appointments  Date Time Provider [place]  05/08/2022  3:00 PM Liane Comber, NP [facility] None     HPI  61 y.o. female  presents for a complete physical and follow up for has Hyperlipidemia, mixed; Vitamin D deficiency; Medication management; Hypothyroidism; Abnormal glucose; Morbid obesity ([place]); Leukocytosis; Persistent atrial fibrillation ([place]); Psoriasis of scalp; Essential hypertension; Multinodular thyroid; and History of malignant melanoma on their problem list..  She is happily divorced, has a good long term relationship of 15+ years, she has 2  grown children, 5 grandchildren. 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   Follows with [facility] Dr. Lanny Cramp, on estrogen gel topical them, just had annual physical 04/27/2021. PAP will be due in 01/2022. Gets mammogram annually at the breast center.   Follows with chiropractor for mid back strain, knee and hip pain, improving with adjustments, doing a tens unit. Likes to walk in pool when weather permits.   She has hx of psoriasis of scalp, has seen rheum, doing topicals PRN only.   BMI is Body mass index is 37.9 kg/m., she has been working on diet, trying to eat the right things, watching portions.  Poor candidate phentermine due to heart, wellbutrin caused very dry mouth without significant benefit. Doing meal delivery, ? factor fresh 4 meals a week. Exercise limited by pain, will walk in the poor when warm, working with chiropractor. Active around home and in garden.    Wegovy not covered by insurance.  Wt Readings from Last 3 Encounters:  05/04/21 234 lb 12.8 oz (106.5 kg)  02/03/21 235 lb 12.8 oz (107 kg)  01/24/21 231 lb 14.4 oz (105.2 kg)   She has a. Fib and hx of flutter with RVR, had ablations in 2017/2018 by Dr Curt Bears. She remains on Eliquis for CHA2DsVASc 2. Taking diltiazem 120 mg daily and flecainide. Continues to follow annually.   Her blood pressure has been controlled at home, today their BP is BP: 128/76 She does not workout, but reports fairly active around the home, but does plan to walk in pool once warmer. She denies chest pain, shortness of breath, dizziness.   She is not on cholesterol medication and denies myalgias. Her cholesterol is not at goal, working on lifestyle. The cholesterol last visit was:   Lab Results  Component Value Date   CHOL 172 01/11/2021   HDL 49 (L) 01/11/2021   LDLCALC 89 01/11/2021   TRIG 261 (H) 01/11/2021   CHOLHDL 3.5 01/11/2021   She has been working on diet and  exercise for glucose management and denies nausea, paresthesia of the feet, polydipsia, polyuria, visual disturbances and vomiting. Last A1C in the office was:  Lab Results  Component Value Date   HGBA1C 5.6 01/11/2021    Last GFR: Lab Results  Component Value Date   GFRNONAA 64 10/06/2020   She has hx of multinodular thyroid goiter, recently left inferior nodule showed growth and underwent FNA on 03/08/2020 which resulted bethesda II. No other nodules were felt concerning and no further follow up was recommended by radiology.   She is on thyroid medication for hypothyroid. Her medication was not changed last visit.  50 mcg daily.  Lab Results  Component Value Date   TSH 3.42 01/11/2021   Patient is on Vitamin D supplement.   Lab Results  Component Value Date   VD25OH 22 01/11/2021     She has had a mildly elevated white blood cell count since at least October 2017 ranging from 10.8-14.6. She was referred to hematology last year for further evaluation.  Peripheral blood flow cytometry and BCR-ABL  mutation are negative and recommended no further work up or follow up by Dr. Grayland Ormond.  CBC Latest Ref Rng & Units 01/24/2021 01/11/2021 10/06/2020  WBC 4.0 - 10.5 K/uL 12.5(H) 14.3(H) 11.5(H)  Hemoglobin 12.0 - 15.0 g/dL 14.2 14.1 13.8  Hematocrit 36.0 - 46.0 % 42.7 41.8 41.6  Platelets 150 - 400 K/uL 319 327 314   Chronic recurrent iron def anemia, on iron slow release 3 times/day with less fatigue. Had negative workup by hematology and was recommended oral supplement. Had negative cologuard in 06/17/2019.  Lab Results  Component Value Date   IRON 37 01/24/2021   TIBC 358 01/24/2021   FERRITIN 150 01/24/2021   Lab Results  Component Value Date   VITAMINB12 1,134 (H) 01/24/2021     Current Medications:  Current Outpatient Medications on File Prior to Visit  Medication Sig Dispense Refill   apixaban (ELIQUIS) 5 MG TABS tablet TAKE 1 TABLET BY MOUTH TWICE A DAY 180 tablet 1    Ascorbic Acid (VITAMIN C) 500 MG CHEW Chew by mouth. 2 times per day      augmented betamethasone dipropionate (DIPROLENE-AF) 0.05 % cream APPLY VERY SPARINGLY TO PSORIASIS RASH (DO NOT USE ON FACE ! ) 50 g 5   Black Pepper-Turmeric 3-500 MG CAPS Take by mouth.     Calcium 600-400 MG-UNIT CHEW Chew 1 each by mouth 2 (two) times daily.      Cholecalciferol (VITAMIN D PO) Take 10,000 Units by mouth daily.     clobetasol (TEMOVATE) 0.05 % external solution Apply 1 application topically 2 (two) times daily. 50 mL 0   Coenzyme Q10 (CO Q10) 100 MG CAPS Take 100 mg by mouth daily.      cromolyn (NASALCROM) 5.2 MG/ACT nasal spray Place 1 spray into both nostrils in the morning and at bedtime.     cyclobenzaprine (FLEXERIL) 5 MG tablet TAKE 1 TABLET BY MOUTH 3 X /DAY AS NEEDED FOR MUSCLE SPASM 270 tablet 1   diltiazem (CARDIZEM CD) 120 MG 24 hr capsule TAKE 1 CAPSULE BY MOUTH EVERY DAY 90 capsule 2   DIVIGEL 0.5 MG/0.5GM GEL SMARTSIG:1 Packet(s) T-DERMAL Daily     ezetimibe (ZETIA) 10 MG tablet TAKE 1 TABLET DAILY FOR CHOLESTREROL 90 tablet 3   ferrous sulfate 324 MG TBEC Take 324 mg by mouth.     flecainide (TAMBOCOR) 100 MG tablet Take  1 tablet  2 x /day (every 12 hours)  for Afib 180 tablet 3   fluocinolone (SYNALAR) 0.01 % external solution See admin instructions.     furosemide (LASIX) 20 MG tablet Take 1 tablet Daily for Fluid Retention 90 tablet 1   levothyroxine (SYNTHROID) 50 MCG tablet TAKE 1 TABLET DAILY ON EMPTY STOMACH WITH ONLY WATER FOR 30 MIN (NO ANTACID MEDS, CALCIUM OR MAGNESIUM FOR 4 HOURS & AVOID BIOTIN) 270 tablet 1   Magnesium 500 MG TABS Take 500 mg by mouth every evening.      Melatonin 1 MG CHEW Chew by mouth. Prn     montelukast (SINGULAIR) 10 MG tablet TAKE 1 TABLET BY MOUTH DAILY FOR ALLERGIES 90 tablet 3   Multiple Vitamin (MULTIVITAMIN WITH MINERALS) TABS tablet Take 1 tablet by mouth daily.     OVER THE COUNTER MEDICATION in the morning and at bedtime. Taking Ferrous  Sulfate 325 mg daily.     Polyethyl Glycol-Propyl Glycol (SYSTANE OP) Place 1 drop into both eyes daily.     Probiotic Product (PROBIOTIC DAILY PO) Take 1 tablet by  mouth daily.      progesterone (PROMETRIUM) 100 MG capsule TAKE 1 CAPSULE BY MOUTH EVERY DAY 30 capsule 1   psyllium (METAMUCIL) 58.6 % powder Take 1 packet by mouth. prn     doxycycline (VIBRAMYCIN) 100 MG capsule Take 1 capsule 2 x/day with food for 10 days. (Patient not taking: Reported on 05/04/2021) 20 capsule 0   No current facility-administered medications on file prior to visit.   Allergies:  Allergies  Allergen Reactions   Lipitor [Atorvastatin] Other (See Comments)    "neck pain"   Metoprolol Other (See Comments)    DIDN'T WORK FOR PATIENT AND GAINED WEIGHT MIGHT HAVE BEEN TAKEN WITH RYTHMOL   Phentermine Other (See Comments)    Did not help, Insomnia    Rythmol [Propafenone] Other (See Comments)    "weight gain"   Sulfa Drugs Cross Reactors Other (See Comments)    "soreness all over"   Lisinopril Cough   Medical History:  She has Hyperlipidemia, mixed; Vitamin D deficiency; Medication management; Hypothyroidism; Abnormal glucose; Morbid obesity ([place]); Leukocytosis; Persistent atrial fibrillation ([place]); Psoriasis of scalp; Essential hypertension; Multinodular thyroid; and History of malignant melanoma on their problem list. Health Maintenance:   Immunization History  Administered Date(s) Administered   PFIZER(Purple Top)SARS-COV-2 Vaccination 06/29/2019, 07/20/2019, 03/16/2020   PPD Test 12/07/2013, 12/22/2014, 08/17/2016, 11/27/2017, 04/14/2019   Tdap 06/23/2015   Health Maintenance  Topic Date Due   Zoster Vaccines- Shingrix (1 of 2) Never done   PAP SMEAR-Modifier  Never done   COVID-19 Vaccine (4 - Booster for [employer] series) 05/11/2020   INFLUENZA VACCINE  Never done   MAMMOGRAM  03/01/2022   Fecal DNA (Cologuard)  06/17/2022   TETANUS/TDAP  06/22/2025   Hepatitis C Screening  Completed   HIV  Screening  Completed   HPV VACCINES  Aged Out    Pap: getting at GYN, next due 01/2022, Dr. Lanny Cramp  Last Dental Exam: few years, will schedule this year Last Eye Exam: 04/2021, [facility], no concerns, reading glasses Last derm: a few years back, plans to schedule this year, remote hx of R leg melanoma removal at age 64  Patient Care Team: Unk Pinto, MD as PCP - General (Internal Medicine) Constance Haw, MD as PCP - Cardiology (Cardiology) Constance Haw, MD as PCP - Electrophysiology (Cardiology)  Surgical History:  She has a past surgical history that includes Melanoma excision (Right, 10/1975); Cesarean section with bilateral tubal ligation (1982); Cesarean section (1980); TEE without cardioversion (N/A, 10/25/2015); Cardioversion (N/A, 10/25/2015); Tonsillectomy (1967); TEE without cardioversion (N/A, 01/11/2016); Cardiac catheterization (N/A, 01/13/2016); A-FLUTTER ABLATION (N/A, 02/08/2017); ATRIAL FIBRILLATION ABLATION (N/A, 04/11/2017); Refractive surgery (Bilateral, 1990s); Tubal ligation; Endometrial ablation (~ 2012); and Hysteroscopy (03/28/2020). Family History:  Herfamily history includes Diabetes in her father; Heart attack in her brother and mother; Heart disease in her father and mother; Hypertension in her father and mother. Social History:  She reports that she has never smoked. She has never used smokeless tobacco. She reports current alcohol use. She reports that she does not use drugs.  Review of Systems: Review of Systems  Constitutional:  Negative for malaise/fatigue and weight loss.  HENT:  Negative for hearing loss and tinnitus.        Frequent rhinitis with intermittent hoarseness  Eyes:  Negative for blurred vision and double vision.  Respiratory:  Negative for cough, shortness of breath and wheezing.   Cardiovascular:  Negative for chest pain, palpitations, orthopnea, claudication and leg swelling.  Gastrointestinal:  Negative for abdominal  pain, blood in stool, constipation, diarrhea, heartburn, melena, nausea and vomiting.  Genitourinary: Negative.   Musculoskeletal:  Positive for joint pain (intermittent bil knees, hips, improved recent). Negative for myalgias.  Skin:  Positive for rash (chronic, scalp).  Neurological:  Negative for dizziness, tingling, sensory change, weakness and headaches.  Endo/Heme/Allergies:  Positive for environmental allergies. Negative for polydipsia.  Psychiatric/Behavioral: Negative.    All other systems reviewed and are negative.  Physical Exam: Estimated body mass index is 37.9 kg/m as calculated from the following:   Height as of this encounter: 5\' 6"  (1.676 m).   Weight as of this encounter: 234 lb 12.8 oz (106.5 kg). BP 128/76    Pulse 77    Temp 97.9 F (36.6 C)    Resp 16    Ht 5\' 6"  (1.676 m)    Wt 234 lb 12.8 oz (106.5 kg)    LMP 10/29/2013 (Approximate)    SpO2 97%    BMI 37.90 kg/m  General Appearance: Well nourished, in no apparent distress.  Eyes: PERRLA, EOMs, conjunctiva no swelling or erythema Sinuses: No Frontal/maxillary tenderness  ENT/Mouth: Ext aud canals clear, normal light reflex with TMs without erythema, bulging. Good dentition. No erythema, swelling, or exudate on post pharynx. Tonsils not swollen or erythematous. Hearing normal.  Neck: Supple, thyroid normal. No bruits  Respiratory: Respiratory effort normal, BS equal bilaterally without rales, rhonchi, wheezing or stridor.  Cardio: RRR without murmurs, rubs or gallops. Brisk peripheral pulses without edema.  Chest: symmetric, with normal excursions and percussion.  Breasts: defer to GYN, recent normal mammogram  Abdomen: Soft, obese abdomen, nontender, no guarding, rebound, hernias, masses, or organomegaly.  Lymphatics: Non tender without lymphadenopathy.  Genitourinary: defer to GYN Musculoskeletal: Full ROM all peripheral extremities,5/5 strength, and normal gait.  Skin: Warm, dry without rashes, lesions,  ecchymosis. Neuro: Cranial nerves intact, reflexes equal bilaterally. Normal muscle tone, no cerebellar symptoms. Sensation intact.  Psych: Awake and oriented X 3, normal affect, Insight and Judgment appropriate.   EKG: cardiology is obtaining annually, had 07/21/2020 - defer  Izora Ribas, NP 3:45 PM [facility]

## 2021-05-04 NOTE — Patient Instructions (Addendum)
Monica Morgan , Thank you for taking time to come for your Annual Wellness Visit. I appreciate your ongoing commitment to your health goals. Please review the following plan we discussed and let me know if I can assist you in the future.   These are the goals we discussed:  Goals      Weight (lb) < 225 lb (102.1 kg)        This is a list of the screening recommended for you and due dates:  Health Maintenance  Topic Date Due   Zoster (Shingles) Vaccine (1 of 2) Never done   Pap Smear  Never done   COVID-19 Vaccine (5 - Booster for Pf[employer] series) 12/05/2020   Flu Shot  07/14/2021*   Mammogram  03/01/2022   Cologuard (Stool DNA test)  06/17/2022   Tetanus Vaccine  06/22/2025   Hepatitis C Screening: USPSTF Recommendation to screen - Ages 18-79 yo.  Completed   HIV Screening  Completed   HPV Vaccine  Aged Out  *Topic was postponed. The date shown is not the original due date.      Please schedule overdue dental  [facility]   Please schedule derm follow up this year       Know what a healthy weight is for you (roughly BMI <25) and aim to maintain this  Aim for 7+ servings of fruits and vegetables daily  65-80+ fluid ounces of water or unsweet tea for healthy kidneys  Limit to max 1 drink of alcohol per day; avoid smoking/tobacco  Limit animal fats in diet for cholesterol and heart health - choose grass fed whenever available  Avoid highly processed foods, and foods high in saturated/trans fats  Aim for low stress - take time to unwind and care for your mental health  Aim for 150 min of moderate intensity exercise weekly for heart health, and weights twice weekly for bone health  Aim for 7-9 hours of sleep daily     High-Fiber Eating Plan Fiber, also called dietary fiber, is a type of carbohydrate. It is found foods such as fruits, vegetables, whole grains, and beans. A high-fiber diet can have many health benefits. Your health care provider  may recommend a high-fiber diet to help: Prevent constipation. Fiber can make your bowel movements more regular. Lower your cholesterol. Relieve the following conditions: Inflammation of veins in the anus (hemorrhoids). Inflammation of specific areas of the digestive tract (uncomplicated diverticulosis). A problem of the large intestine, also called the colon, that sometimes causes pain and diarrhea (irritable bowel syndrome, or IBS). Prevent overeating as part of a weight-loss plan. Prevent heart disease, type 2 diabetes, and certain cancers. What are tips for following this plan? Reading food labels  Check the nutrition facts label on food products for the amount of dietary fiber. Choose foods that have 5 grams of fiber or more per serving. The goals for recommended daily fiber intake include: Men (age 71 or younger): 34-38 g. Men (over age 39): 28-34 g. Women (age 16 or younger): 25-28 g. Women (over age 42): 22-25 g. Your daily fiber goal is _____________ g. Shopping Choose whole fruits and vegetables instead of processed forms, such as apple juice or applesauce. Choose a wide variety of high-fiber foods such as avocados, lentils, oats, and kidney beans. Read the nutrition facts label of the foods you choose. Be aware of foods with added fiber. These foods often have high sugar and sodium amounts per serving. Cooking Use whole-grain flour  for baking and cooking. Cook with brown rice instead of white rice. Meal planning Start the day with a breakfast that is high in fiber, such as a cereal that contains 5 g of fiber or more per serving. Eat breads and cereals that are made with whole-grain flour instead of refined flour or white flour. Eat brown rice, bulgur wheat, or millet instead of white rice. Use beans in place of meat in soups, salads, and pasta dishes. Be sure that half of the grains you eat each day are whole grains. General information You can get the recommended daily  intake of dietary fiber by: Eating a variety of fruits, vegetables, grains, nuts, and beans. Taking a fiber supplement if you are not able to take in enough fiber in your diet. It is better to get fiber through food than from a supplement. Gradually increase how much fiber you consume. If you increase your intake of dietary fiber too quickly, you may have bloating, cramping, or gas. Drink plenty of water to help you digest fiber. Choose high-fiber snacks, such as berries, raw vegetables, nuts, and popcorn. What foods should I eat? Fruits Berries. Pears. Apples. Oranges. Avocado. Prunes and raisins. Dried figs. Vegetables Sweet potatoes. Spinach. Kale. Artichokes. Cabbage. Broccoli. Cauliflower. Green peas. Carrots. Squash. Grains Whole-grain breads. Multigrain cereal. Oats and oatmeal. Brown rice. Barley. Bulgur wheat. [place]. Quinoa. Bran muffins. Popcorn. Rye wafer crackers. Meats and other proteins Navy beans, kidney beans, and pinto beans. Soybeans. Split peas. Lentils. Nuts and seeds. Dairy Fiber-fortified yogurt. Beverages Fiber-fortified soy milk. Fiber-fortified orange juice. Other foods Fiber bars. The items listed above may not be a complete list of recommended foods and beverages. Contact a dietitian for more information. What foods should I avoid? Fruits Fruit juice. Cooked, strained fruit. Vegetables Fried potatoes. Canned vegetables. Well-cooked vegetables. Grains White bread. Pasta made with refined flour. White rice. Meats and other proteins Fatty cuts of meat. Fried chicken or fried fish. Dairy Milk. Yogurt. Cream cheese. Sour cream. Fats and oils Butters. Beverages Soft drinks. Other foods Cakes and pastries. The items listed above may not be a complete list of foods and beverages to avoid. Talk with your dietitian about what choices are best for you. Summary Fiber is a type of carbohydrate. It is found in foods such as fruits, vegetables, whole grains, and  beans. A high-fiber diet has many benefits. It can help to prevent constipation, lower blood cholesterol, aid weight loss, and reduce your risk of heart disease, diabetes, and certain cancers. Increase your intake of fiber gradually. Increasing fiber too quickly may cause cramping, bloating, and gas. Drink plenty of water while you increase the amount of fiber you consume. The best sources of fiber include whole fruits and vegetables, whole grains, nuts, seeds, and beans. This information is not intended to replace advice given to you by your health care provider. Make sure you discuss any questions you have with your health care provider. Document Revised: 08/06/2019 Document Reviewed: 08/06/2019 Elsevier Patient Education  2022 [employer].

## 2021-05-05 DIAGNOSIS — M9906 Segmental and somatic dysfunction of lower extremity: Secondary | ICD-10-CM | POA: Diagnosis not present

## 2021-05-05 DIAGNOSIS — M9903 Segmental and somatic dysfunction of lumbar region: Secondary | ICD-10-CM | POA: Diagnosis not present

## 2021-05-05 DIAGNOSIS — M62830 Muscle spasm of back: Secondary | ICD-10-CM | POA: Diagnosis not present

## 2021-05-05 DIAGNOSIS — M9902 Segmental and somatic dysfunction of thoracic region: Secondary | ICD-10-CM | POA: Diagnosis not present

## 2021-05-05 DIAGNOSIS — M9901 Segmental and somatic dysfunction of cervical region: Secondary | ICD-10-CM | POA: Diagnosis not present

## 2021-05-05 LAB — URINALYSIS, ROUTINE W REFLEX MICROSCOPIC
Bilirubin Urine: NEGATIVE
Glucose, UA: NEGATIVE
Hgb urine dipstick: NEGATIVE
Hyaline Cast: NONE SEEN /LPF
Ketones, ur: NEGATIVE
Nitrite: NEGATIVE
Protein, ur: NEGATIVE
RBC / HPF: NONE SEEN /HPF (ref 0–2)
Specific Gravity, Urine: 1.02 (ref 1.001–1.035)
Yeast: NONE SEEN /HPF
pH: 6 (ref 5.0–8.0)

## 2021-05-05 LAB — CBC WITH DIFFERENTIAL/PLATELET
Absolute Monocytes: 728 cells/uL (ref 200–950)
Basophils Absolute: 78 cells/uL (ref 0–200)
Basophils Relative: 0.7 %
Eosinophils Absolute: 134 cells/uL (ref 15–500)
Eosinophils Relative: 1.2 %
HCT: 41.8 % (ref 35.0–45.0)
Hemoglobin: 14 g/dL (ref 11.7–15.5)
Lymphs Abs: 3718 cells/uL (ref 850–3900)
MCH: 29.7 pg (ref 27.0–33.0)
MCHC: 33.5 g/dL (ref 32.0–36.0)
MCV: 88.7 fL (ref 80.0–100.0)
MPV: 10.3 fL (ref 7.5–12.5)
Monocytes Relative: 6.5 %
Neutro Abs: 6541 cells/uL (ref 1500–7800)
Neutrophils Relative %: 58.4 %
Platelets: 336 10*3/uL (ref 140–400)
RBC: 4.71 10*6/uL (ref 3.80–5.10)
RDW: 12.6 % (ref 11.0–15.0)
Total Lymphocyte: 33.2 %
WBC: 11.2 10*3/uL — ABNORMAL HIGH (ref 3.8–10.8)

## 2021-05-05 LAB — COMPLETE METABOLIC PANEL WITH GFR
AG Ratio: 2.2 (calc) (ref 1.0–2.5)
ALT: 17 U/L (ref 6–29)
AST: 16 U/L (ref 10–35)
Albumin: 5 g/dL (ref 3.6–5.1)
Alkaline phosphatase (APISO): 74 U/L (ref 37–153)
BUN: 22 mg/dL (ref 7–25)
CO2: 31 mmol/L (ref 20–32)
Calcium: 10.3 mg/dL (ref 8.6–10.4)
Chloride: 100 mmol/L (ref 98–110)
Creat: 0.86 mg/dL (ref 0.50–1.05)
Globulin: 2.3 g/dL (calc) (ref 1.9–3.7)
Glucose, Bld: 92 mg/dL (ref 65–99)
Potassium: 4.4 mmol/L (ref 3.5–5.3)
Sodium: 139 mmol/L (ref 135–146)
Total Bilirubin: 0.4 mg/dL (ref 0.2–1.2)
Total Protein: 7.3 g/dL (ref 6.1–8.1)
eGFR: 76 mL/min/{1.73_m2} (ref >=60)

## 2021-05-05 LAB — HEMOGLOBIN A1C
Hgb A1c MFr Bld: 5.7 % of total Hgb — ABNORMAL HIGH (ref <5.7)
Mean Plasma Glucose: 117 mg/dL
eAG (mmol/L): 6.5 mmol/L

## 2021-05-05 LAB — LIPID PANEL
Cholesterol: 199 mg/dL (ref <200)
HDL: 52 mg/dL (ref >=50)
LDL Cholesterol (Calc): 113 mg/dL (calc) — ABNORMAL HIGH
Non-HDL Cholesterol (Calc): 147 mg/dL (calc) — ABNORMAL HIGH (ref <130)
Total CHOL/HDL Ratio: 3.8 (calc) (ref <5.0)
Triglycerides: 216 mg/dL — ABNORMAL HIGH (ref <150)

## 2021-05-05 LAB — MICROALBUMIN / CREATININE URINE RATIO
Creatinine, Urine: 107 mg/dL (ref 20–275)
Microalb Creat Ratio: 6 mcg/mg creat (ref <30)
Microalb, Ur: 0.6 mg/dL

## 2021-05-05 LAB — IRON,TIBC AND FERRITIN PANEL
%SAT: 19 % (calc) (ref 16–45)
Ferritin: 171 ng/mL (ref 16–288)
Iron: 65 ug/dL (ref 45–160)
TIBC: 335 mcg/dL (calc) (ref 250–450)

## 2021-05-05 LAB — TSH: TSH: 3.85 mIU/L (ref 0.40–4.50)

## 2021-05-05 LAB — MICROSCOPIC MESSAGE

## 2021-05-05 LAB — MAGNESIUM: Magnesium: 2.6 mg/dL — ABNORMAL HIGH (ref 1.5–2.5)

## 2021-05-05 NOTE — Addendum Note (Signed)
Addended by: Izora Ribas on: 05/05/2021 08:39 AM   Modules accepted: Orders

## 2021-05-06 LAB — URINE CULTURE
MICRO NUMBER:: 12899009
SPECIMEN QUALITY:: ADEQUATE

## 2021-05-21 ENCOUNTER — Other Ambulatory Visit: Payer: Self-pay | Admitting: Cardiology

## 2021-05-22 NOTE — Telephone Encounter (Signed)
Pt last saw Dr Curt Bears 07/21/20, last labs 05/04/21 Creat 0.86, age 61, weight 106.5kg, based on specified criteria pt is on appropriate dosage of Eliquis 5mg  BID for afib.  Will refill rx.

## 2021-05-23 DIAGNOSIS — M9906 Segmental and somatic dysfunction of lower extremity: Secondary | ICD-10-CM | POA: Diagnosis not present

## 2021-05-23 DIAGNOSIS — M9903 Segmental and somatic dysfunction of lumbar region: Secondary | ICD-10-CM | POA: Diagnosis not present

## 2021-05-23 DIAGNOSIS — M9901 Segmental and somatic dysfunction of cervical region: Secondary | ICD-10-CM | POA: Diagnosis not present

## 2021-05-23 DIAGNOSIS — M62830 Muscle spasm of back: Secondary | ICD-10-CM | POA: Diagnosis not present

## 2021-05-23 DIAGNOSIS — M9902 Segmental and somatic dysfunction of thoracic region: Secondary | ICD-10-CM | POA: Diagnosis not present

## 2021-06-01 DIAGNOSIS — M9906 Segmental and somatic dysfunction of lower extremity: Secondary | ICD-10-CM | POA: Diagnosis not present

## 2021-06-01 DIAGNOSIS — M62830 Muscle spasm of back: Secondary | ICD-10-CM | POA: Diagnosis not present

## 2021-06-01 DIAGNOSIS — M9902 Segmental and somatic dysfunction of thoracic region: Secondary | ICD-10-CM | POA: Diagnosis not present

## 2021-06-01 DIAGNOSIS — M9901 Segmental and somatic dysfunction of cervical region: Secondary | ICD-10-CM | POA: Diagnosis not present

## 2021-06-01 DIAGNOSIS — M9903 Segmental and somatic dysfunction of lumbar region: Secondary | ICD-10-CM | POA: Diagnosis not present

## 2021-06-05 DIAGNOSIS — M9903 Segmental and somatic dysfunction of lumbar region: Secondary | ICD-10-CM | POA: Diagnosis not present

## 2021-06-05 DIAGNOSIS — M9906 Segmental and somatic dysfunction of lower extremity: Secondary | ICD-10-CM | POA: Diagnosis not present

## 2021-06-05 DIAGNOSIS — M62830 Muscle spasm of back: Secondary | ICD-10-CM | POA: Diagnosis not present

## 2021-06-05 DIAGNOSIS — M9901 Segmental and somatic dysfunction of cervical region: Secondary | ICD-10-CM | POA: Diagnosis not present

## 2021-06-05 DIAGNOSIS — M9902 Segmental and somatic dysfunction of thoracic region: Secondary | ICD-10-CM | POA: Diagnosis not present

## 2021-06-19 DIAGNOSIS — M9903 Segmental and somatic dysfunction of lumbar region: Secondary | ICD-10-CM | POA: Diagnosis not present

## 2021-06-19 DIAGNOSIS — M9906 Segmental and somatic dysfunction of lower extremity: Secondary | ICD-10-CM | POA: Diagnosis not present

## 2021-06-19 DIAGNOSIS — M62830 Muscle spasm of back: Secondary | ICD-10-CM | POA: Diagnosis not present

## 2021-06-19 DIAGNOSIS — M9901 Segmental and somatic dysfunction of cervical region: Secondary | ICD-10-CM | POA: Diagnosis not present

## 2021-06-19 DIAGNOSIS — M9902 Segmental and somatic dysfunction of thoracic region: Secondary | ICD-10-CM | POA: Diagnosis not present

## 2021-06-20 ENCOUNTER — Other Ambulatory Visit: Payer: Self-pay | Admitting: Adult Health

## 2021-06-20 ENCOUNTER — Encounter: Payer: Self-pay | Admitting: Adult Health

## 2021-06-20 MED ORDER — FUROSEMIDE 20 MG PO TABS: ORAL_TABLET | ORAL | 1 refills | Status: DC

## 2021-07-05 ENCOUNTER — Other Ambulatory Visit: Payer: Self-pay | Admitting: Adult Health

## 2021-07-17 ENCOUNTER — Other Ambulatory Visit: Payer: Self-pay | Admitting: Adult Health

## 2021-07-17 DIAGNOSIS — M9903 Segmental and somatic dysfunction of lumbar region: Secondary | ICD-10-CM | POA: Diagnosis not present

## 2021-07-17 DIAGNOSIS — M9901 Segmental and somatic dysfunction of cervical region: Secondary | ICD-10-CM | POA: Diagnosis not present

## 2021-07-17 DIAGNOSIS — M9902 Segmental and somatic dysfunction of thoracic region: Secondary | ICD-10-CM | POA: Diagnosis not present

## 2021-07-17 DIAGNOSIS — M9906 Segmental and somatic dysfunction of lower extremity: Secondary | ICD-10-CM | POA: Diagnosis not present

## 2021-07-17 DIAGNOSIS — M62830 Muscle spasm of back: Secondary | ICD-10-CM | POA: Diagnosis not present

## 2021-07-24 ENCOUNTER — Other Ambulatory Visit: Payer: Self-pay | Admitting: Adult Health

## 2021-07-25 ENCOUNTER — Encounter: Payer: Self-pay | Admitting: Adult Health

## 2021-08-14 DIAGNOSIS — M9902 Segmental and somatic dysfunction of thoracic region: Secondary | ICD-10-CM | POA: Diagnosis not present

## 2021-08-14 DIAGNOSIS — M9903 Segmental and somatic dysfunction of lumbar region: Secondary | ICD-10-CM | POA: Diagnosis not present

## 2021-08-14 DIAGNOSIS — M62830 Muscle spasm of back: Secondary | ICD-10-CM | POA: Diagnosis not present

## 2021-08-14 DIAGNOSIS — M9906 Segmental and somatic dysfunction of lower extremity: Secondary | ICD-10-CM | POA: Diagnosis not present

## 2021-08-14 DIAGNOSIS — M9901 Segmental and somatic dysfunction of cervical region: Secondary | ICD-10-CM | POA: Diagnosis not present

## 2021-08-23 ENCOUNTER — Other Ambulatory Visit: Payer: Self-pay | Admitting: Adult Health

## 2021-08-23 DIAGNOSIS — R208 Other disturbances of skin sensation: Secondary | ICD-10-CM | POA: Diagnosis not present

## 2021-08-23 DIAGNOSIS — D2262 Melanocytic nevi of left upper limb, including shoulder: Secondary | ICD-10-CM | POA: Diagnosis not present

## 2021-08-23 DIAGNOSIS — D2271 Melanocytic nevi of right lower limb, including hip: Secondary | ICD-10-CM | POA: Diagnosis not present

## 2021-08-23 DIAGNOSIS — D2261 Melanocytic nevi of right upper limb, including shoulder: Secondary | ICD-10-CM | POA: Diagnosis not present

## 2021-08-23 DIAGNOSIS — D224 Melanocytic nevi of scalp and neck: Secondary | ICD-10-CM | POA: Diagnosis not present

## 2021-08-23 DIAGNOSIS — D225 Melanocytic nevi of trunk: Secondary | ICD-10-CM | POA: Diagnosis not present

## 2021-08-23 DIAGNOSIS — L538 Other specified erythematous conditions: Secondary | ICD-10-CM | POA: Diagnosis not present

## 2021-08-23 DIAGNOSIS — L820 Inflamed seborrheic keratosis: Secondary | ICD-10-CM | POA: Diagnosis not present

## 2021-09-16 ENCOUNTER — Other Ambulatory Visit: Payer: Self-pay | Admitting: Adult Health

## 2021-09-18 DIAGNOSIS — M62830 Muscle spasm of back: Secondary | ICD-10-CM | POA: Diagnosis not present

## 2021-09-18 DIAGNOSIS — M9902 Segmental and somatic dysfunction of thoracic region: Secondary | ICD-10-CM | POA: Diagnosis not present

## 2021-09-18 DIAGNOSIS — M9901 Segmental and somatic dysfunction of cervical region: Secondary | ICD-10-CM | POA: Diagnosis not present

## 2021-09-18 DIAGNOSIS — M9903 Segmental and somatic dysfunction of lumbar region: Secondary | ICD-10-CM | POA: Diagnosis not present

## 2021-09-18 DIAGNOSIS — M9906 Segmental and somatic dysfunction of lower extremity: Secondary | ICD-10-CM | POA: Diagnosis not present

## 2021-10-10 ENCOUNTER — Other Ambulatory Visit: Payer: Self-pay | Admitting: Nurse Practitioner

## 2021-10-11 ENCOUNTER — Other Ambulatory Visit: Payer: Self-pay | Admitting: Internal Medicine

## 2021-10-11 ENCOUNTER — Encounter: Payer: Self-pay | Admitting: Adult Health

## 2021-10-11 ENCOUNTER — Ambulatory Visit (INDEPENDENT_AMBULATORY_CARE_PROVIDER_SITE_OTHER): Payer: BC Managed Care – PPO | Admitting: Adult Health

## 2021-10-11 VITALS — BP 110/72 | HR 72 | Temp 97.3°F | Wt 231.0 lb

## 2021-10-11 DIAGNOSIS — E039 Hypothyroidism, unspecified: Secondary | ICD-10-CM | POA: Diagnosis not present

## 2021-10-11 DIAGNOSIS — I4819 Other persistent atrial fibrillation: Secondary | ICD-10-CM | POA: Diagnosis not present

## 2021-10-11 DIAGNOSIS — Z79899 Other long term (current) drug therapy: Secondary | ICD-10-CM

## 2021-10-11 DIAGNOSIS — R7309 Other abnormal glucose: Secondary | ICD-10-CM | POA: Diagnosis not present

## 2021-10-11 DIAGNOSIS — E782 Mixed hyperlipidemia: Secondary | ICD-10-CM | POA: Diagnosis not present

## 2021-10-11 DIAGNOSIS — I10 Essential (primary) hypertension: Secondary | ICD-10-CM

## 2021-10-11 DIAGNOSIS — E559 Vitamin D deficiency, unspecified: Secondary | ICD-10-CM

## 2021-10-11 MED ORDER — FUROSEMIDE 20 MG PO TABS: ORAL_TABLET | ORAL | 1 refills | Status: DC

## 2021-10-11 MED ORDER — FLECAINIDE ACETATE 100 MG PO TABS: ORAL_TABLET | ORAL | 3 refills | Status: DC

## 2021-10-11 NOTE — Progress Notes (Signed)
63 MONTH FOLLOW UP   Assessment and Plan:  Diagnoses and all orders for this visit:  Essential hypertension Continue medications Monitor blood pressure at home; call if consistently over 130/80 Continue DASH diet.   Reminder to go to the ER if any CP, SOB, nausea, dizziness, severe HA, changes vision/speech, left arm numbness and tingling and jaw pain. -     CBC with Differential/Platelet -     COMPLETE METABOLIC PANEL WITH GFR -     Magnesium  Persistent atrial fibrillation ([place]) Rate controlled; cardiology following; continue elequis   Hypothyroidism, unspecified type continue medications the same pending lab results reminded to take on an empty stomach 30-28mins before food.  -     TSH  Morbid obesity ([place]) - BMI 37 with htn, hld Long discussion about weight loss, diet, and exercise Recommended diet heavy in fruits and veggies and low in animal meats, cheeses, and dairy products, appropriate calorie intake Patient will work on portions, increase dietary fiber  Discussed appropriate weight for height  Follow up at next visit  Prediabetes Discussed disease and risks Discussed diet/exercise, weight management  Check A1C q5m;        -     A1C  Hyperlipidemia, mixed Mild elevations working on lifestyle Continue low cholesterol diet and exercise.  Check lipid panel.  -     Lipid panel -     TSH  Vitamin D deficiency Continue supplement; check annually   Medication management -     CBC with Differential/Platelet -     COMPLETE METABOLIC PANEL WITH GFR -     Magnesium  Leukocytosis, unspecified type Has seen hematology, no further workup recommended Monitor CBC   Orders Placed This Encounter  Procedures   CBC with Differential/Platelet   COMPLETE METABOLIC PANEL WITH GFR   Magnesium   Lipid panel   TSH   Hemoglobin A1c     Discussed med's effects and SE's. Labs as requested with regular follow-up as recommended. Over 30 minutes of exam, counseling, chart  review, and complex, high level critical decision making was performed this visit.   Future Appointments  Date Time Provider [place]  05/08/2022  3:00 PM Darrol Jump, NP [facility] None     HPI  61 y.o. female  presents for 6 follow up for has Hyperlipidemia, mixed; Vitamin D deficiency; Medication management; Hypothyroidism; Abnormal glucose; Morbid obesity ([place]); Leukocytosis; Persistent atrial fibrillation ([place]); Psoriasis of scalp; Essential hypertension; Multinodular thyroid; and History of malignant melanoma on their problem list..  Follows with chiropractor for mid back strain, knee and hip pain, improving with adjustments, doing a tens unit. Likes to walk in pool when weather permits.   She has hx of psoriasis of scalp, has seen rheum, doing topicals PRN only.   BMI is Body mass index is 37.28 kg/m., she has been working on diet, trying to eat the right things, watching portions.  Poor candidate phentermine due to heart, wellbutrin caused very dry mouth without significant benefit. No longer doing meal delivery, just started summer wellness program through work Exercise limited by pain, will walk in the poor when warm, working with chiropractor. Active around home and in garden.    Wegovy not covered by insurance.  Wt Readings from Last 3 Encounters:  10/11/21 231 lb (104.8 kg)  05/04/21 234 lb 12.8 oz (106.5 kg)  02/03/21 235 lb 12.8 oz (107 kg)   She has a. Fib and hx of flutter with RVR, had ablations in 2017/2018 by  Dr Curt Bears. She remains on Eliquis for CHA2DsVASc 2. Taking diltiazem 120 mg daily and flecainide. Continues to follow annually.   Her blood pressure has been controlled at home, today their BP is BP: 110/72 She does not workout, but reports fairly active around the home, does plan to start a walking program. She denies chest pain, shortness of breath, dizziness.   She is not on cholesterol medication and denies myalgias. Her cholesterol is not at  goal, working on lifestyle. The cholesterol last visit was:   Lab Results  Component Value Date   CHOL 199 05/04/2021   HDL 52 05/04/2021   LDLCALC 113 (H) 05/04/2021   TRIG 216 (H) 05/04/2021   CHOLHDL 3.8 05/04/2021   She has been working on diet and exercise for glucose management and denies nausea, paresthesia of the feet, polydipsia, polyuria, visual disturbances and vomiting. Last A1C in the office was:  Lab Results  Component Value Date   HGBA1C 5.7 (H) 05/04/2021    Last GFR: Lab Results  Component Value Date   EGFR 76 05/04/2021   She has hx of multinodular thyroid goiter, recently left inferior nodule showed growth and underwent FNA on 03/08/2020 which resulted bethesda II. No other nodules were felt concerning and no further follow up was recommended by radiology.   She is on thyroid medication for hypothyroid. Her medication was not changed last visit.  50 mcg daily.  Lab Results  Component Value Date   TSH 3.85 05/04/2021   Patient is on Vitamin D supplement.   Lab Results  Component Value Date   VD25OH 47 01/11/2021     She has had a mildly elevated white blood cell count since at least October 2017 ranging from 10.8-14.6. She was referred to hematology in 2021 for further evaluation.  Peripheral blood flow cytometry and BCR-ABL mutation are negative and recommended no further work up or follow up by Dr. Grayland Ormond.     Latest Ref Rng & Units 05/04/2021    3:48 PM 01/24/2021    3:45 PM 01/11/2021    4:02 PM  CBC  WBC 3.8 - 10.8 Thousand/uL 11.2  12.5  14.3   Hemoglobin 11.7 - 15.5 g/dL 14.0  14.2  14.1   Hematocrit 35.0 - 45.0 % 41.8  42.7  41.8   Platelets 140 - 400 Thousand/uL 336  319  327    Chronic recurrent iron def anemia, on iron slow release, was on TID, has reduced to BID Had negative workup by hematology and was recommended oral supplement. Had negative cologuard in 06/17/2019.  Lab Results  Component Value Date   IRON 65 05/04/2021   TIBC 335  05/04/2021   FERRITIN 171 05/04/2021   Lab Results  Component Value Date   VITAMINB12 1,134 (H) 01/24/2021     Current Medications:  Current Outpatient Medications on File Prior to Visit  Medication Sig Dispense Refill   Ascorbic Acid (VITAMIN C) 500 MG CHEW Chew by mouth. 2 times per day      augmented betamethasone dipropionate (DIPROLENE-AF) 0.05 % cream APPLY VERY SPARINGLY TO PSORIASIS RASH (DO NOT USE ON FACE ! ) 50 g 5   Black Pepper-Turmeric 3-500 MG CAPS Take by mouth.     Calcium 600-400 MG-UNIT CHEW Chew 1 each by mouth 2 (two) times daily.      Cholecalciferol (VITAMIN D PO) Take 10,000 Units by mouth daily.     Coenzyme Q10 (CO Q10) 100 MG CAPS Take 100 mg by mouth daily.  cromolyn (NASALCROM) 5.2 MG/ACT nasal spray Place 1 spray into both nostrils in the morning and at bedtime.     cyclobenzaprine (FLEXERIL) 5 MG tablet TAKE 1 TABLET BY MOUTH 3 TIMES DAILY AS NEEDED FOR MUSCLE SPASM 270 tablet 0   diltiazem (CARDIZEM CD) 120 MG 24 hr capsule TAKE 1 CAPSULE BY MOUTH EVERY DAY 90 capsule 2   DIVIGEL 0.5 MG/0.5GM GEL SMARTSIG:1 Packet(s) T-DERMAL Daily     ELIQUIS 5 MG TABS tablet TAKE 1 TABLET BY MOUTH TWICE A DAY 180 tablet 1   ezetimibe (ZETIA) 10 MG tablet TAKE 1 TABLET DAILY FOR CHOLESTREROL 90 tablet 3   ferrous sulfate 324 MG TBEC Take 324 mg by mouth.     fluocinolone (SYNALAR) 0.01 % external solution See admin instructions.     levothyroxine (SYNTHROID) 50 MCG tablet TAKE 1 TABLET DAILY ON EMPTY STOMACH WITH ONLY WATER FOR 30 MIN (NO ANTACID MEDS, CALCIUM OR MAGNESIUM FOR 4 HOURS & AVOID BIOTIN) 270 tablet 1   Magnesium 500 MG TABS Take 500 mg by mouth every evening.      Melatonin 1 MG CHEW Chew by mouth. Prn     montelukast (SINGULAIR) 10 MG tablet TAKE 1 TABLET BY MOUTH DAILY FOR ALLERGIES 90 tablet 3   Multiple Vitamin (MULTIVITAMIN WITH MINERALS) TABS tablet Take 1 tablet by mouth daily.     OVER THE COUNTER MEDICATION in the morning and at bedtime. Taking  Ferrous Sulfate 325 mg daily.     Polyethyl Glycol-Propyl Glycol (SYSTANE OP) Place 1 drop into both eyes daily.     Probiotic Product (PROBIOTIC DAILY PO) Take 1 tablet by mouth daily.      progesterone (PROMETRIUM) 100 MG capsule TAKE 1 CAPSULE BY MOUTH EVERY DAY 30 capsule 1   psyllium (METAMUCIL) 58.6 % powder Take 1 packet by mouth. prn     No current facility-administered medications on file prior to visit.   Allergies:  Allergies  Allergen Reactions   Lipitor [Atorvastatin] Other (See Comments)    "neck pain"   Metoprolol Other (See Comments)    DIDN'T WORK FOR PATIENT AND GAINED WEIGHT MIGHT HAVE BEEN TAKEN WITH RYTHMOL   Phentermine Other (See Comments)    Did not help, Insomnia    Rythmol [Propafenone] Other (See Comments)    "weight gain"   Sulfa Drugs Cross Reactors Other (See Comments)    "soreness all over"   Lisinopril Cough   Medical History:  She has Hyperlipidemia, mixed; Vitamin D deficiency; Medication management; Hypothyroidism; Abnormal glucose; Morbid obesity ([place]); Leukocytosis; Persistent atrial fibrillation ([place]); Psoriasis of scalp; Essential hypertension; Multinodular thyroid; and History of malignant melanoma on their problem list.  Surgical History:  She has a past surgical history that includes Melanoma excision (Right, 10/1975); Cesarean section with bilateral tubal ligation (1982); Cesarean section (1980); TEE without cardioversion (N/A, 10/25/2015); Cardioversion (N/A, 10/25/2015); Tonsillectomy (1967); TEE without cardioversion (N/A, 01/11/2016); Cardiac catheterization (N/A, 01/13/2016); A-FLUTTER ABLATION (N/A, 02/08/2017); ATRIAL FIBRILLATION ABLATION (N/A, 04/11/2017); Refractive surgery (Bilateral, 1990s); Tubal ligation; Endometrial ablation (~ 2012); and Hysteroscopy (03/28/2020). Family History:  Herfamily history includes Diabetes in her father; Heart attack in her brother and mother; Heart disease in her father and mother; Hypertension in her  father and mother. Social History:  She reports that she has never smoked. She has never used smokeless tobacco. She reports current alcohol use. She reports that she does not use drugs.  Review of Systems: Review of Systems  Constitutional:  Negative for malaise/fatigue and weight  loss.  HENT:  Negative for hearing loss and tinnitus.        Frequent rhinitis with intermittent hoarseness  Eyes:  Negative for blurred vision and double vision.  Respiratory:  Negative for cough, shortness of breath and wheezing.   Cardiovascular:  Negative for chest pain, palpitations, orthopnea, claudication and leg swelling.  Gastrointestinal:  Negative for abdominal pain, blood in stool, constipation, diarrhea, heartburn, melena, nausea and vomiting.  Genitourinary: Negative.   Musculoskeletal:  Positive for joint pain (intermittent bil knees, hips, improved recent). Negative for myalgias.  Skin:  Positive for rash (chronic, scalp).  Neurological:  Negative for dizziness, tingling, sensory change, weakness and headaches.  Endo/Heme/Allergies:  Positive for environmental allergies. Negative for polydipsia.  Psychiatric/Behavioral: Negative.    All other systems reviewed and are negative.   Physical Exam: Estimated body mass index is 37.28 kg/m as calculated from the following:   Height as of 05/04/21: $RemoveBef'5\' 6"'FxTaNfeOxE$  (1.676 m).   Weight as of this encounter: 231 lb (104.8 kg). BP 110/72   Pulse 72   Temp (!) 97.3 F (36.3 C)   Wt 231 lb (104.8 kg)   LMP 10/29/2013 (Approximate)   SpO2 97%   BMI 37.28 kg/m  General Appearance: Well nourished, in no apparent distress.  Eyes: PERRLA, EOMs, conjunctiva no swelling or erythema Sinuses: No Frontal/maxillary tenderness  ENT/Mouth: Ext aud canals clear, normal light reflex with TMs without erythema, bulging. Good dentition. No erythema, swelling, or exudate on post pharynx. Tonsils not swollen or erythematous. Hearing normal.  Neck: Supple, thyroid normal. No  bruits  Respiratory: Respiratory effort normal, BS equal bilaterally without rales, rhonchi, wheezing or stridor.  Cardio: RRR without murmurs, rubs or gallops. Brisk peripheral pulses without edema.  Abdomen: Soft, obese abdomen, nontender, no guarding, rebound, hernias, masses, or organomegaly.  Lymphatics: Non tender without lymphadenopathy.  Musculoskeletal: Full ROM all peripheral extremities,5/5 strength, and normal gait.  Skin: Warm, dry without rashes, lesions, ecchymosis. Neuro: Cranial nerves intact, reflexes equal bilaterally. Normal muscle tone, no cerebellar symptoms. Sensation intact.  Psych: Awake and oriented X 3, normal affect, Insight and Judgment appropriate.   Izora Ribas, NP 4:18 PM [facility]

## 2021-10-12 DIAGNOSIS — M9902 Segmental and somatic dysfunction of thoracic region: Secondary | ICD-10-CM | POA: Diagnosis not present

## 2021-10-12 DIAGNOSIS — M9903 Segmental and somatic dysfunction of lumbar region: Secondary | ICD-10-CM | POA: Diagnosis not present

## 2021-10-12 DIAGNOSIS — M62830 Muscle spasm of back: Secondary | ICD-10-CM | POA: Diagnosis not present

## 2021-10-12 DIAGNOSIS — M9901 Segmental and somatic dysfunction of cervical region: Secondary | ICD-10-CM | POA: Diagnosis not present

## 2021-10-12 DIAGNOSIS — M9906 Segmental and somatic dysfunction of lower extremity: Secondary | ICD-10-CM | POA: Diagnosis not present

## 2021-10-12 LAB — COMPLETE METABOLIC PANEL WITH GFR
AG Ratio: 1.8 (calc) (ref 1.0–2.5)
ALT: 19 U/L (ref 6–29)
AST: 16 U/L (ref 10–35)
Albumin: 4.8 g/dL (ref 3.6–5.1)
Alkaline phosphatase (APISO): 78 U/L (ref 37–153)
BUN: 25 mg/dL (ref 7–25)
CO2: 28 mmol/L (ref 20–32)
Calcium: 9.8 mg/dL (ref 8.6–10.4)
Chloride: 101 mmol/L (ref 98–110)
Creat: 0.9 mg/dL (ref 0.50–1.05)
Globulin: 2.6 g/dL (calc) (ref 1.9–3.7)
Glucose, Bld: 99 mg/dL (ref 65–99)
Potassium: 4.5 mmol/L (ref 3.5–5.3)
Sodium: 139 mmol/L (ref 135–146)
Total Bilirubin: 0.4 mg/dL (ref 0.2–1.2)
Total Protein: 7.4 g/dL (ref 6.1–8.1)
eGFR: 72 mL/min/{1.73_m2} (ref >=60)

## 2021-10-12 LAB — CBC WITH DIFFERENTIAL/PLATELET
Absolute Monocytes: 865 cells/uL (ref 200–950)
Basophils Absolute: 79 cells/uL (ref 0–200)
Basophils Relative: 0.6 %
Eosinophils Absolute: 118 cells/uL (ref 15–500)
Eosinophils Relative: 0.9 %
HCT: 42.5 % (ref 35.0–45.0)
Hemoglobin: 14.2 g/dL (ref 11.7–15.5)
Lymphs Abs: 4022 cells/uL — ABNORMAL HIGH (ref 850–3900)
MCH: 29.4 pg (ref 27.0–33.0)
MCHC: 33.4 g/dL (ref 32.0–36.0)
MCV: 88 fL (ref 80.0–100.0)
MPV: 10.3 fL (ref 7.5–12.5)
Monocytes Relative: 6.6 %
Neutro Abs: 8017 cells/uL — ABNORMAL HIGH (ref 1500–7800)
Neutrophils Relative %: 61.2 %
Platelets: 321 10*3/uL (ref 140–400)
RBC: 4.83 10*6/uL (ref 3.80–5.10)
RDW: 12.6 % (ref 11.0–15.0)
Total Lymphocyte: 30.7 %
WBC: 13.1 10*3/uL — ABNORMAL HIGH (ref 3.8–10.8)

## 2021-10-12 LAB — LIPID PANEL
Cholesterol: 186 mg/dL (ref <200)
HDL: 50 mg/dL (ref >=50)
LDL Cholesterol (Calc): 98 mg/dL (calc)
Non-HDL Cholesterol (Calc): 136 mg/dL (calc) — ABNORMAL HIGH (ref <130)
Total CHOL/HDL Ratio: 3.7 (calc) (ref <5.0)
Triglycerides: 269 mg/dL — ABNORMAL HIGH (ref <150)

## 2021-10-12 LAB — TSH: TSH: 2.88 mIU/L (ref 0.40–4.50)

## 2021-10-12 LAB — HEMOGLOBIN A1C
Hgb A1c MFr Bld: 5.6 % of total Hgb (ref <5.7)
Mean Plasma Glucose: 114 mg/dL
eAG (mmol/L): 6.3 mmol/L

## 2021-10-12 LAB — MAGNESIUM: Magnesium: 2.5 mg/dL (ref 1.5–2.5)

## 2021-10-15 IMAGING — US US THYROID
1 series · 15 of 25 positions shown · non-contrast
Comparison: 01/14/2019
COMPARISON: 01/14/2019

Addendum:
CLINICAL DATA: Prior ultrasound follow-up. Follow-up right-sided
thyroid nodule.

EXAM:
THYROID ULTRASOUND
TECHNIQUE: Ultrasound examination of the thyroid gland and adjacent soft
tissues was performed.

[Series 1: us thyroid · 0.06mm/px · 15 of 45 slices shown]
[im 1/45]
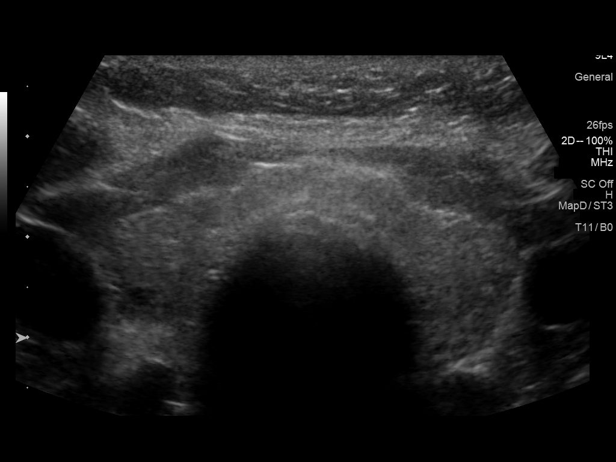
[im 4/45]
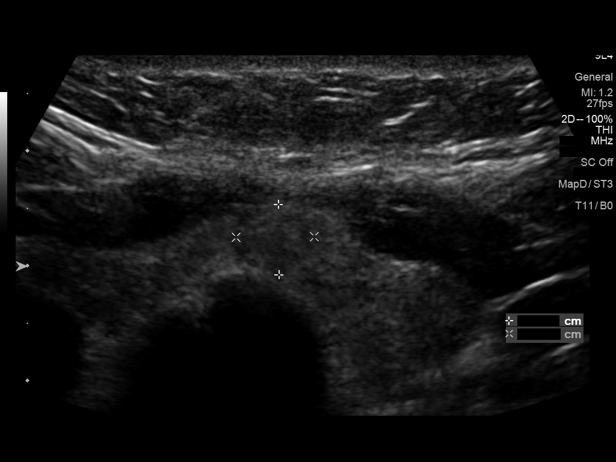
[im 8/45]
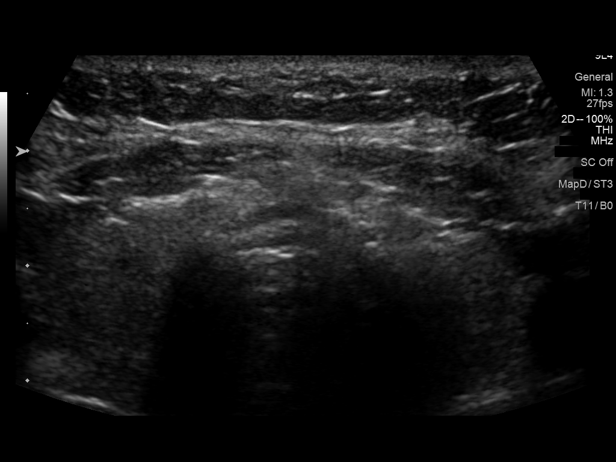
[im 10/45]
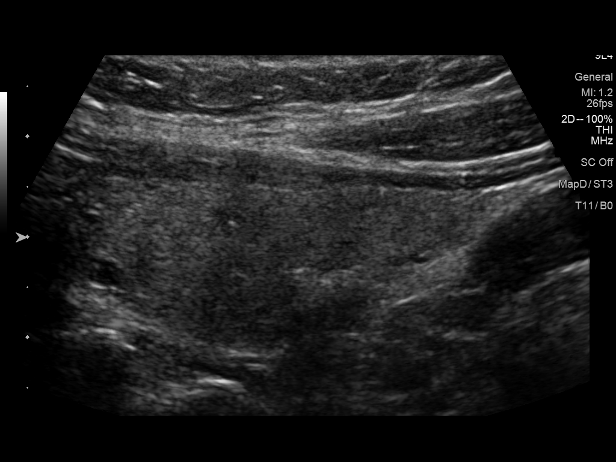
[im 13/45]
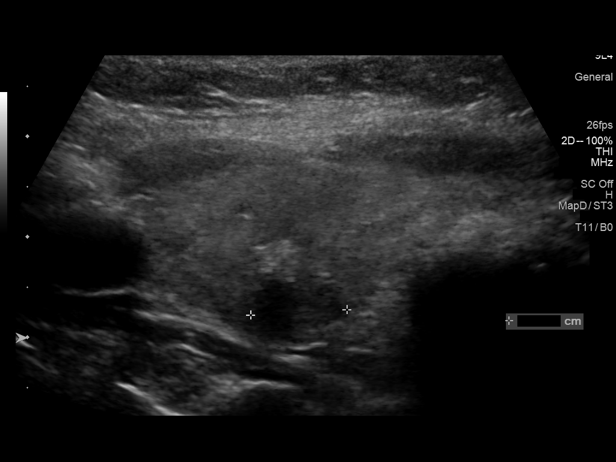
[im 17/45]
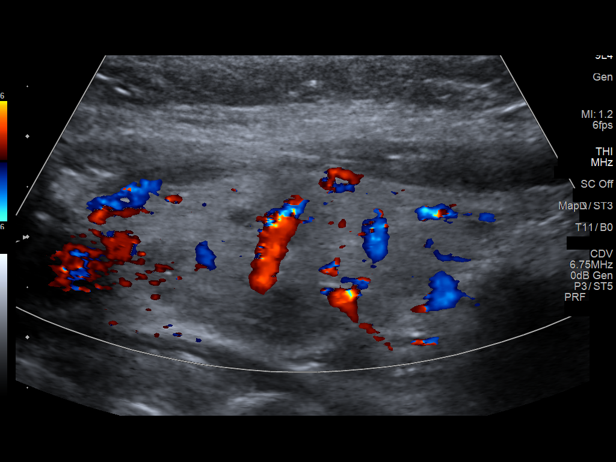
[im 19/45]
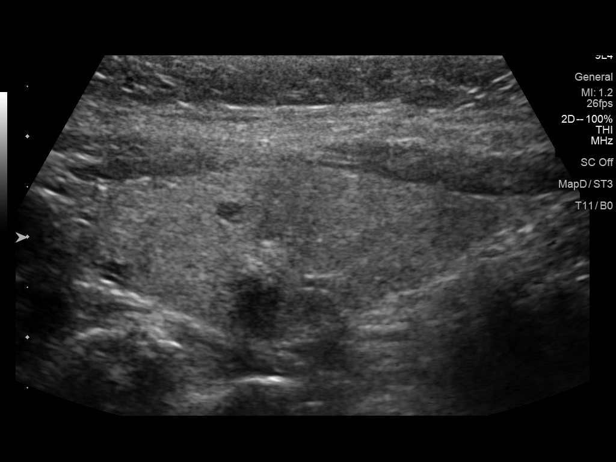
[im 23/45]
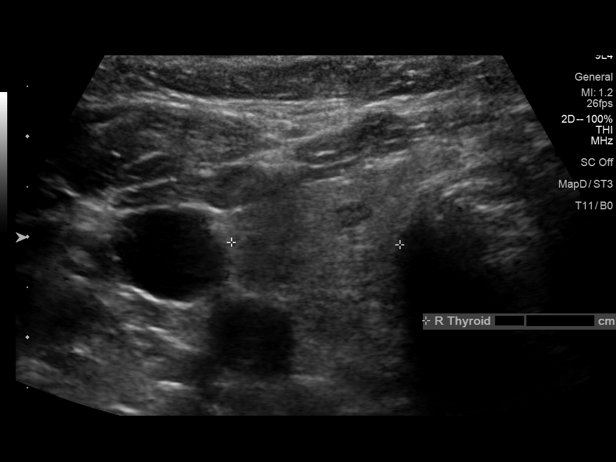
[im 26/45]
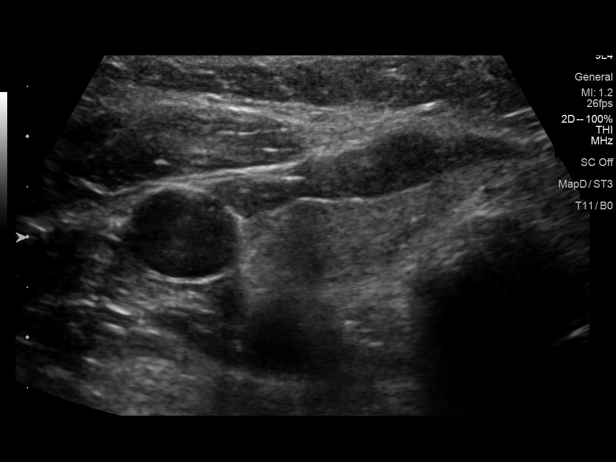
[im 28/45]
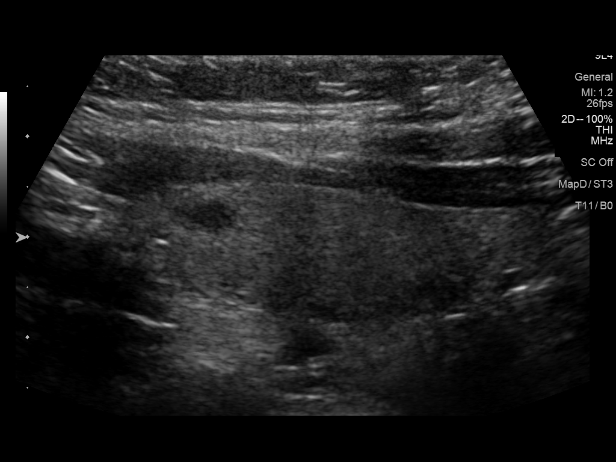
[im 32/45]
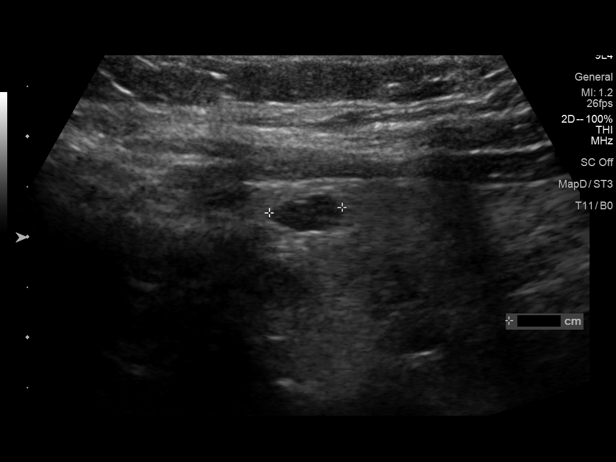
[im 35/45]
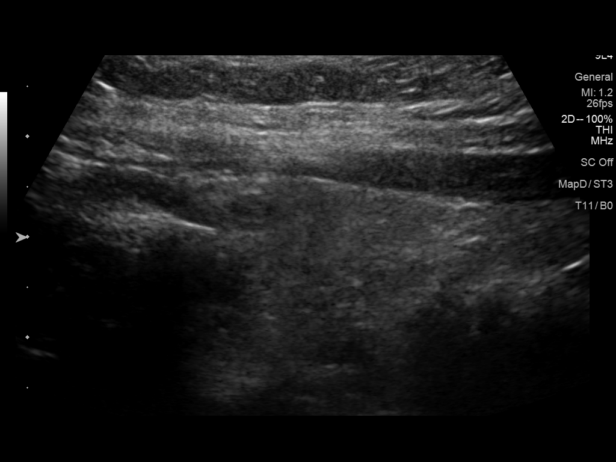
[im 37/45]
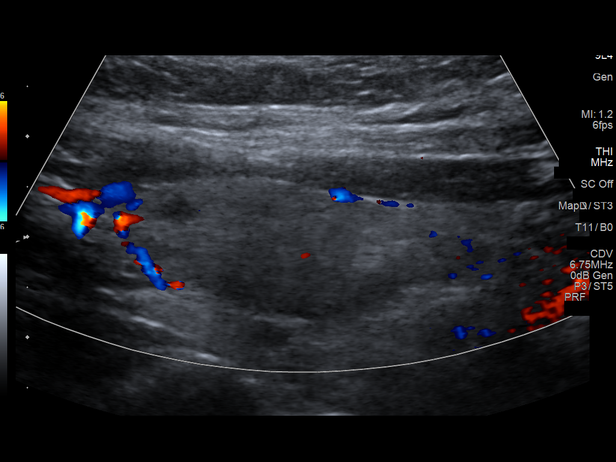
[im 41/45]
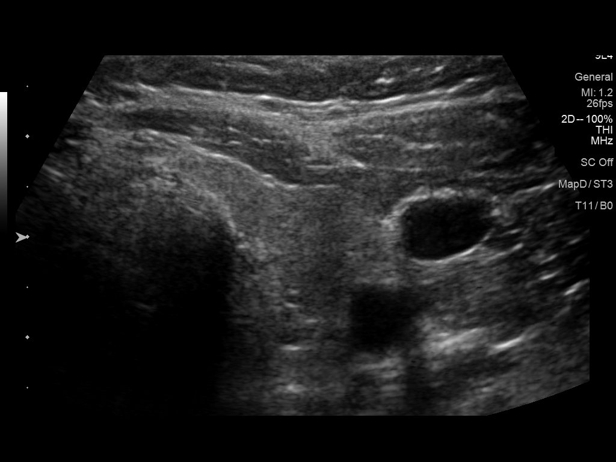
[im 45/45]
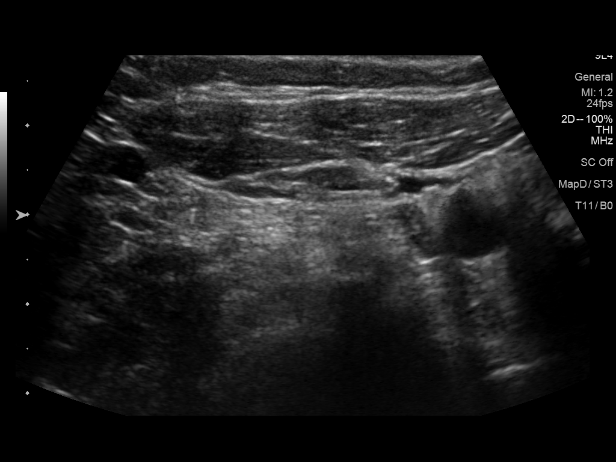

[15 of 25 positions shown; findings below may reference images not displayed]

FINDINGS: Parenchymal Echotexture: Mildly heterogenous

Isthmus: Normal in size measures 0.3 cm in diameter, unchanged

Right lobe: Normal in size measuring 4.1 x 1.7 x 1.7 cm, previously,
4.0 x 1.6 x 1.6 cm

Left lobe: Normal in size measuring 4.2 x 1.4 x 1.6 cm, previously,
3.9 x 1.1 x 1.4 cm

_________________________________________________________

Estimated total number of nodules >/= 1 cm: 1

Number of spongiform nodules >/=  2 cm not described below (TR1): 0

Number of mixed cystic and solid nodules >/= 1.5 cm not described
below (TR2): 0

_________________________________________________________

The approximately 0.8 cm isoechoic nodule/pseudonodule within the
thyroid isthmus (labeled 1), is unchanged compared to the [DATE]
examination and again does not meet criteria to recommend
percutaneous sampling or continued dedicated follow-up

_________________________________________________________

Nodule # 2:

Prior biopsy: No

Location: Left; Inferior

Maximum size: 1.3 cm; Other 2 dimensions: 1.2 x 1.1 cm, previously,
1.0 x 0.9 x 0.9 cm

Composition: solid/almost completely solid (2)

Echogenicity: hypoechoic (2)

Shape: not taller-than-wide (0)

Margins: ill-defined (0)

Echogenic foci: macrocalcifications (1)

ACR TI-RADS total points: 5.

ACR TI-RADS risk category:  TR5 (>/= 7 points).

Significant change in size (>/= 20% in two dimensions and minimal
increase of 2 mm): No

Change in features: Yes - previously appeared taller than wide,
currently appears wider than tall, though differences potentially
attributable to scan plane projection.

Change in ACR TI-RADS risk category: No

ACR TI-RADS recommendations:

**Given size (>/= 1.0 cm) and appearance, fine needle aspiration of
this highly suspicious nodule should be considered based on TI-RADS
criteria.

_________________________________________________________

Punctate (approximately 0.7 cm) anechoic cyst within the superior
pole the left lobe of the thyroid (labeled 3) is unchanged compared
to the [DATE] examination and again does not meet criteria to
recommend percutaneous sampling or continued dedicated follow-up.
IMPRESSION: 1. Nodule #2 has potentially increased in size and as such now meets
imaging criteria to recommend percutaneous sampling as clinically
indicated.
2. Remaining thyroid nodules/cysts are unchanged compared to the
[DATE] examination and again do not meet criteria to recommend
percutaneous sampling or continued dedicated follow-up.

The above is in keeping with the ACR TI-RADS recommendations - [HOSPITAL] 4198;[DATE].

ADDENDUM:
Following is a correction to above typographical air:

Nodule #2 is located within the RIGHT lobe of the thyroid, not the
left.

*** End of Addendum ***
FINDINGS: Parenchymal Echotexture: Mildly heterogenous

Isthmus: Normal in size measures 0.3 cm in diameter, unchanged

Right lobe: Normal in size measuring 4.1 x 1.7 x 1.7 cm, previously,
4.0 x 1.6 x 1.6 cm

Left lobe: Normal in size measuring 4.2 x 1.4 x 1.6 cm, previously,
3.9 x 1.1 x 1.4 cm

_________________________________________________________

Estimated total number of nodules >/= 1 cm: 1

Number of spongiform nodules >/=  2 cm not described below (TR1): 0

Number of mixed cystic and solid nodules >/= 1.5 cm not described
below (TR2): 0

_________________________________________________________

The approximately 0.8 cm isoechoic nodule/pseudonodule within the
thyroid isthmus (labeled 1), is unchanged compared to the [DATE]
examination and again does not meet criteria to recommend
percutaneous sampling or continued dedicated follow-up

_________________________________________________________

Nodule # 2:

Prior biopsy: No

Location: Left; Inferior

Maximum size: 1.3 cm; Other 2 dimensions: 1.2 x 1.1 cm, previously,
1.0 x 0.9 x 0.9 cm

Composition: solid/almost completely solid (2)

Echogenicity: hypoechoic (2)

Shape: not taller-than-wide (0)

Margins: ill-defined (0)

Echogenic foci: macrocalcifications (1)

ACR TI-RADS total points: 5.

ACR TI-RADS risk category:  TR5 (>/= 7 points).

Significant change in size (>/= 20% in two dimensions and minimal
increase of 2 mm): No

Change in features: Yes - previously appeared taller than wide,
currently appears wider than tall, though differences potentially
attributable to scan plane projection.

Change in ACR TI-RADS risk category: No

ACR TI-RADS recommendations:

**Given size (>/= 1.0 cm) and appearance, fine needle aspiration of
this highly suspicious nodule should be considered based on TI-RADS
criteria.

_________________________________________________________

Punctate (approximately 0.7 cm) anechoic cyst within the superior
pole the left lobe of the thyroid (labeled 3) is unchanged compared
to the [DATE] examination and again does not meet criteria to
recommend percutaneous sampling or continued dedicated follow-up.
IMPRESSION: 1. Nodule #2 has potentially increased in size and as such now meets
imaging criteria to recommend percutaneous sampling as clinically
indicated.
2. Remaining thyroid nodules/cysts are unchanged compared to the
[DATE] examination and again do not meet criteria to recommend
percutaneous sampling or continued dedicated follow-up.

The above is in keeping with the ACR TI-RADS recommendations - [HOSPITAL] 4198;[DATE].

## 2021-11-03 IMAGING — MG DIGITAL SCREENING BILAT W/ CAD
4 series · 4 of 4 positions shown · non-contrast
Comparison: Previous exam(s).

CLINICAL DATA: Screening.

EXAM:
DIGITAL SCREENING BILATERAL MAMMOGRAM WITH CAD

[R CC]
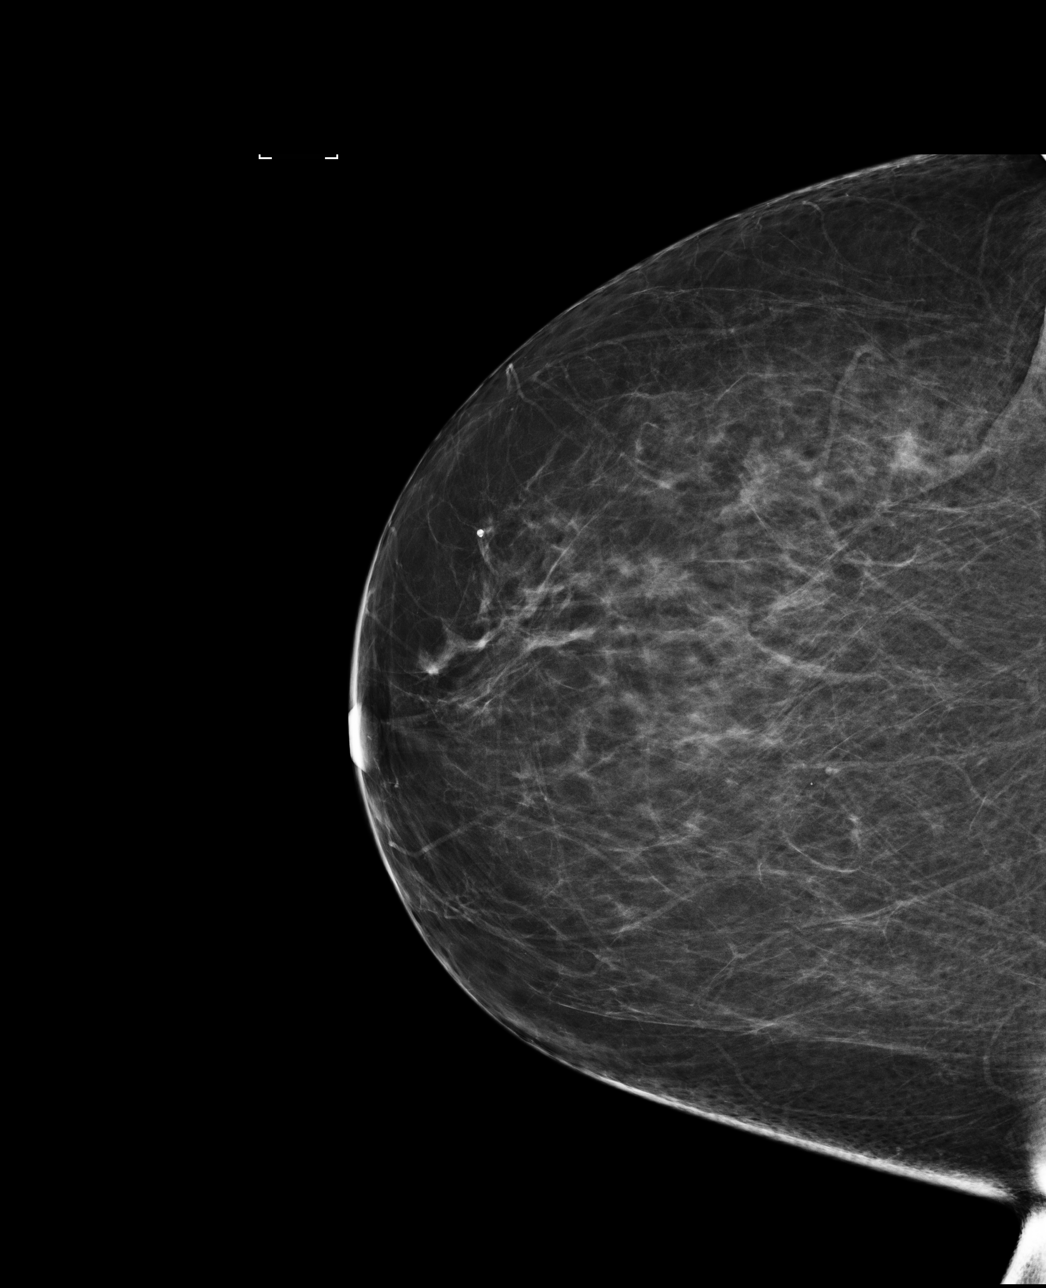

[R MLO]
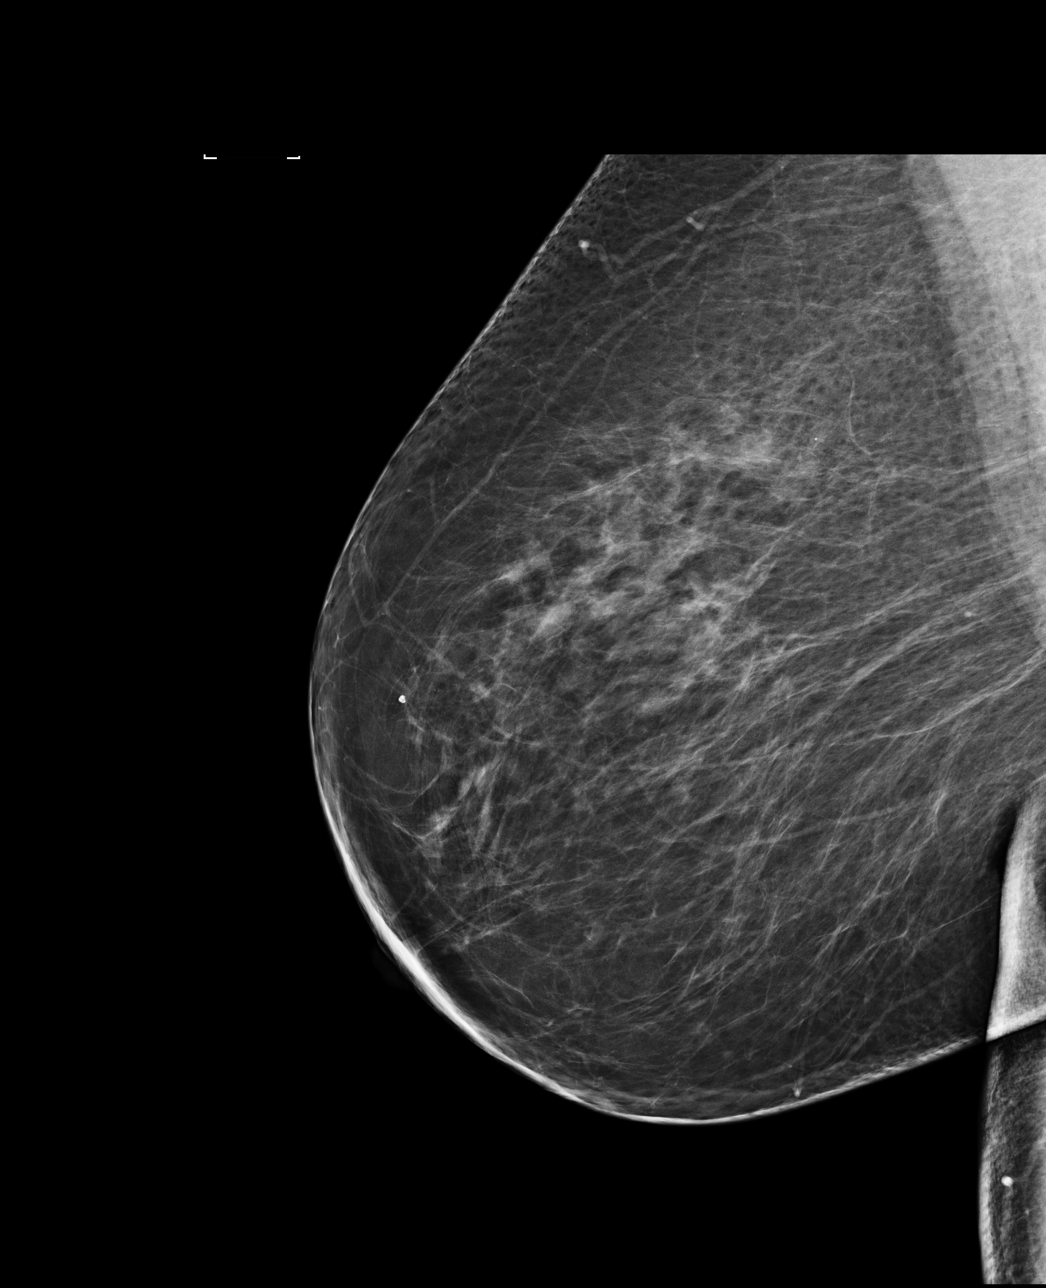

[L CC]
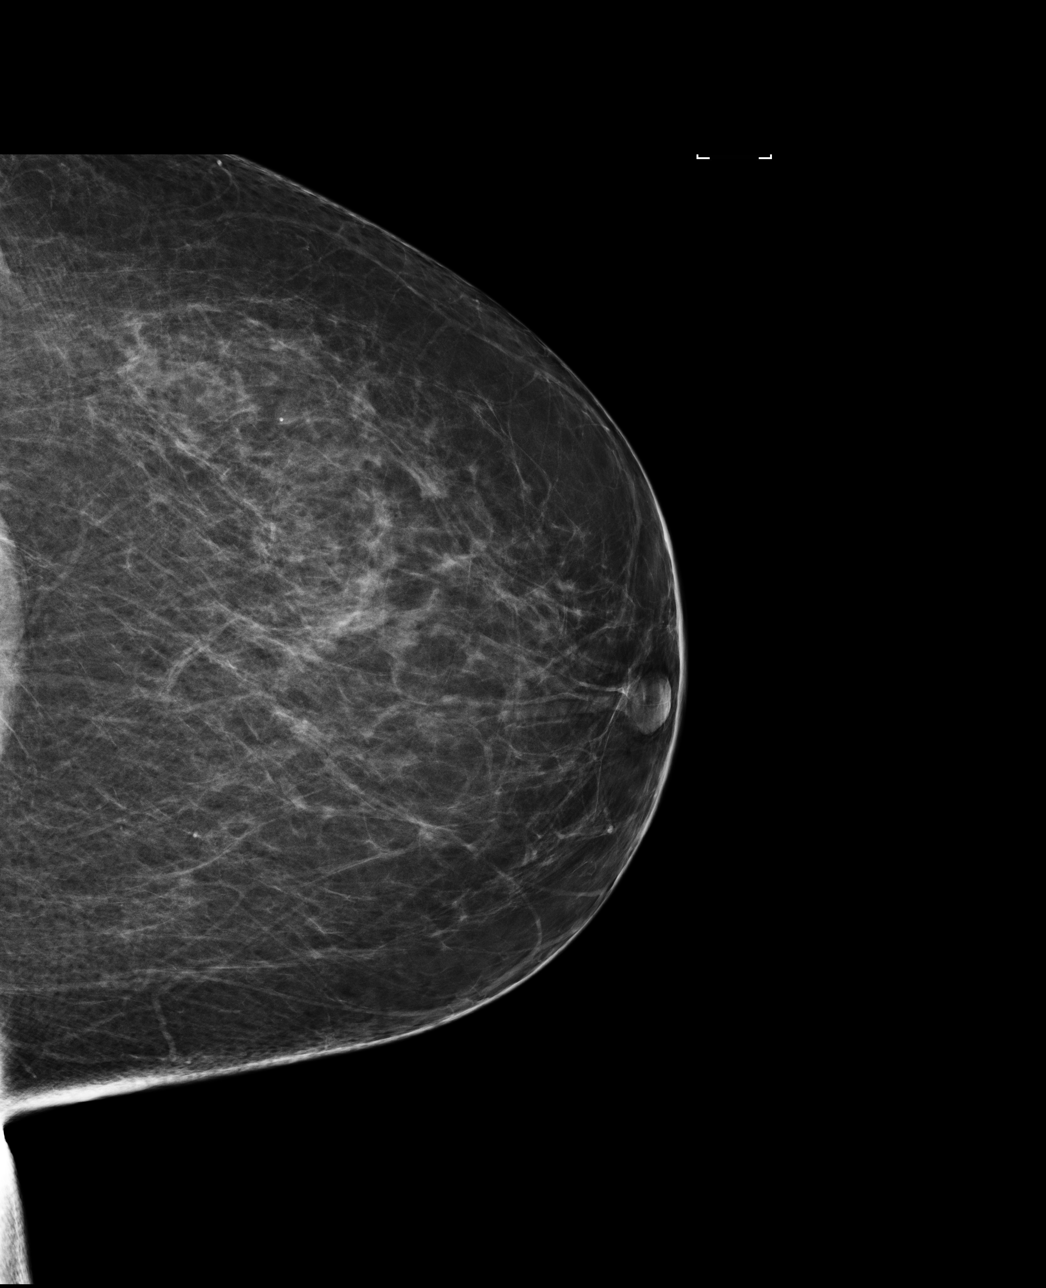

[L MLO]
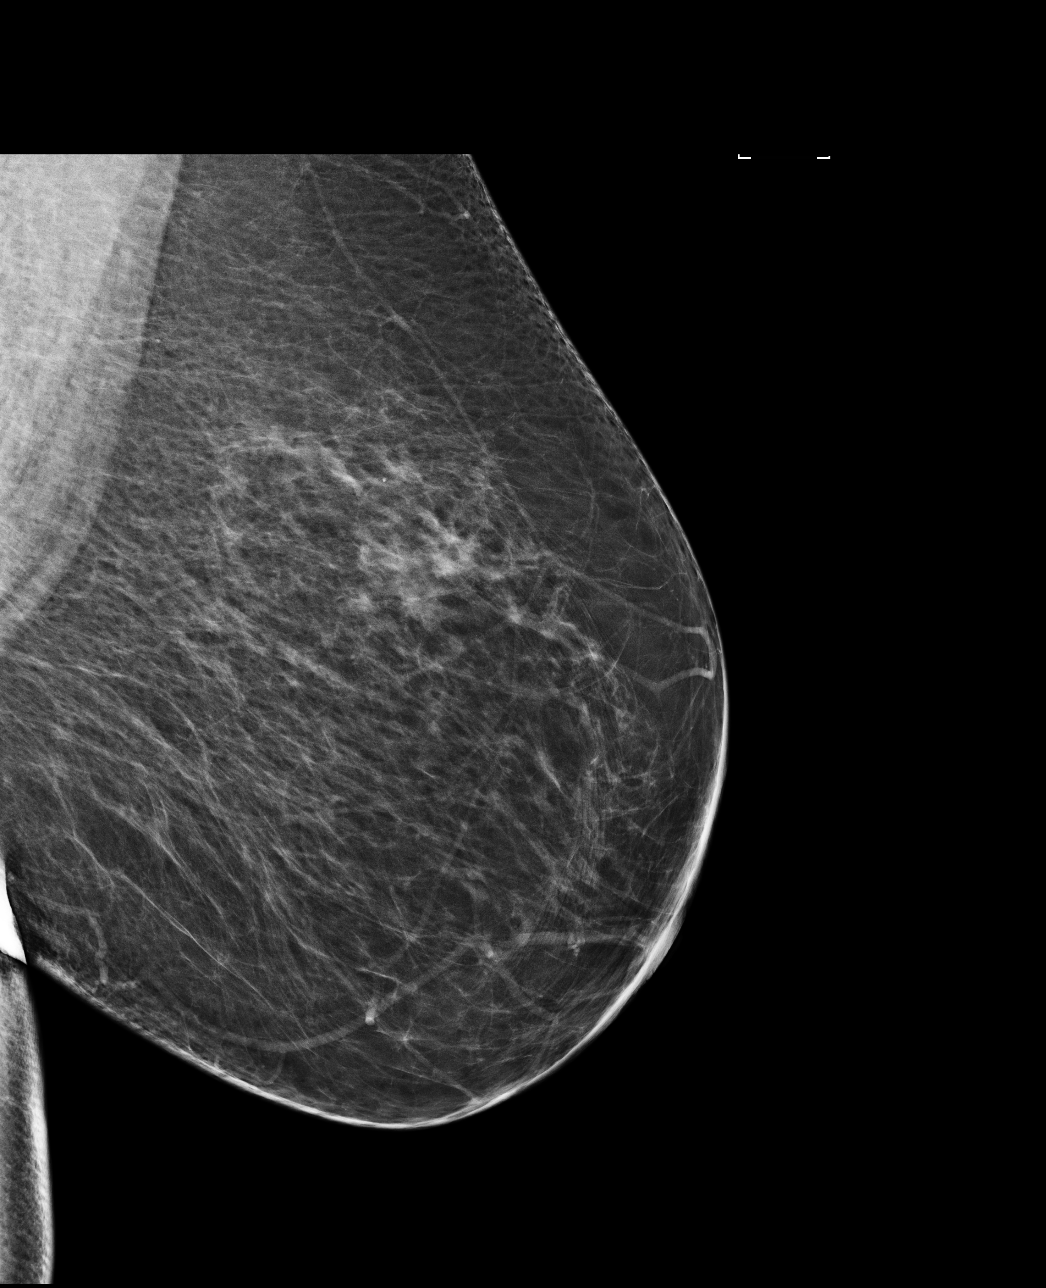

[4 of 4 positions shown; findings below may reference images not displayed]

ACR Breast Density Category b: There are scattered areas of
fibroglandular density.
FINDINGS: There are no findings suspicious for malignancy. Images were
processed with CAD.
IMPRESSION: No mammographic evidence of malignancy. A result letter of this
screening mammogram will be mailed directly to the patient.

RECOMMENDATION:
Screening mammogram in one year. (Code:AS-G-LCT)

BI-RADS CATEGORY  1: Negative.

## 2021-11-06 DIAGNOSIS — M9906 Segmental and somatic dysfunction of lower extremity: Secondary | ICD-10-CM | POA: Diagnosis not present

## 2021-11-06 DIAGNOSIS — M62830 Muscle spasm of back: Secondary | ICD-10-CM | POA: Diagnosis not present

## 2021-11-06 DIAGNOSIS — M9901 Segmental and somatic dysfunction of cervical region: Secondary | ICD-10-CM | POA: Diagnosis not present

## 2021-11-06 DIAGNOSIS — M9903 Segmental and somatic dysfunction of lumbar region: Secondary | ICD-10-CM | POA: Diagnosis not present

## 2021-11-06 DIAGNOSIS — M9902 Segmental and somatic dysfunction of thoracic region: Secondary | ICD-10-CM | POA: Diagnosis not present

## 2021-11-30 ENCOUNTER — Telehealth: Payer: Self-pay | Admitting: Cardiology

## 2021-11-30 ENCOUNTER — Other Ambulatory Visit: Payer: Self-pay | Admitting: Cardiology

## 2021-11-30 NOTE — Telephone Encounter (Signed)
Spoke with pt who reports mild intermittent SOB on exertion.  She feels this is related to the summer heat and some weight gain.  She denies CP or dizziness.  Pt states she does have intermittent swelling of left foot for which she takes Furosemide and swelling generally resolves.  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  She is on a low sodium diet.  She denies any additional problems at this time and would like to schedule her routine follow up with Dr Curt Bears.  She will continue to monitor and notify office with any increase in symptoms. Pt advised will have scheduler contact her with appointment date and time.  Pt verbalizes understanding and agrees with current plan.

## 2021-11-30 NOTE — Telephone Encounter (Signed)
Pt sent this to via Mychart to the scheduling pool:    SOB only when moving around (i.e., walking into and out of parking lots).  It started about a week ago.  No other symptoms.  Blood pressure is fine, no A-Fib, no trouble sleeping, and generally feel good.  I did gain about 5 lbs since my last doctor visit with my PCP, so that could be the reason.     Good afternoon,  Could you answer the following questions for me please?   1. Are you currently SOB (can you hear that pt is SOB on the phone)?   2. How long have you been experiencing SOB?   3. Are you SOB when sitting or when up moving around?   4. Are you currently experiencing any other symptoms?       Comments: I'm starting to have some shortness of breath (not severe), and it is time for my normal check up anyway based on the letter I received.  I would prefer 8/21 at 2:00 or 2:30.  Otherwise, I don't have availability until 8/28 in am or 8/31 in am.  I tried calling twice but couldn't get through due to heavy call volume.

## 2021-11-30 NOTE — Telephone Encounter (Signed)
Prescription refill request for Eliquis received. Indication: afib  Last office visit: [place] 07/21/2020 Scr: 0.90, 10/11/2021 Age: 61 yo Weight: 104.8 kg   Pt is overdue to see cardiologist. Msg sent to schedulers to make appointment.

## 2021-12-04 DIAGNOSIS — M9906 Segmental and somatic dysfunction of lower extremity: Secondary | ICD-10-CM | POA: Diagnosis not present

## 2021-12-04 DIAGNOSIS — M9902 Segmental and somatic dysfunction of thoracic region: Secondary | ICD-10-CM | POA: Diagnosis not present

## 2021-12-04 DIAGNOSIS — M9901 Segmental and somatic dysfunction of cervical region: Secondary | ICD-10-CM | POA: Diagnosis not present

## 2021-12-04 DIAGNOSIS — M5382 Other specified dorsopathies, cervical region: Secondary | ICD-10-CM | POA: Diagnosis not present

## 2021-12-04 DIAGNOSIS — M9903 Segmental and somatic dysfunction of lumbar region: Secondary | ICD-10-CM | POA: Diagnosis not present

## 2021-12-04 DIAGNOSIS — M62830 Muscle spasm of back: Secondary | ICD-10-CM | POA: Diagnosis not present

## 2021-12-06 ENCOUNTER — Ambulatory Visit: Payer: BC Managed Care – PPO | Admitting: Physician Assistant

## 2021-12-06 ENCOUNTER — Encounter: Payer: Self-pay | Admitting: Internal Medicine

## 2021-12-06 ENCOUNTER — Other Ambulatory Visit: Payer: Self-pay | Admitting: Internal Medicine

## 2021-12-06 VITALS — BP 148/78 | HR 76 | Wt 235.0 lb

## 2021-12-06 DIAGNOSIS — I4819 Other persistent atrial fibrillation: Secondary | ICD-10-CM | POA: Diagnosis not present

## 2021-12-06 DIAGNOSIS — M1612 Unilateral primary osteoarthritis, left hip: Secondary | ICD-10-CM

## 2021-12-06 DIAGNOSIS — I10 Essential (primary) hypertension: Secondary | ICD-10-CM

## 2021-12-06 DIAGNOSIS — R011 Cardiac murmur, unspecified: Secondary | ICD-10-CM | POA: Diagnosis not present

## 2021-12-06 DIAGNOSIS — R0602 Shortness of breath: Secondary | ICD-10-CM

## 2021-12-06 MED ORDER — METOPROLOL TARTRATE 100 MG PO TABS: ORAL_TABLET | ORAL | 0 refills | Status: DC

## 2021-12-06 NOTE — Progress Notes (Signed)
Cardiology Office Note Date:  12/06/2021  Patient ID:  Monica, Morgan 1960-05-14, MRN [id number] PCP:  Unk Pinto, MD  Electrophysiologist: Dr. Curt Bears    Chief Complaint: SOB, due for annual visit  History of Present Illness: Monica Morgan is a 61 y.o. female with history of HTN, hypothyroidism, psoriasis/rosacea, AFib, AFlutter, ATach.  She comes in today to be seen for Dr. Curt Bears, last seen by him April 2022, he discussed her hx of  AF ablation 01/13/2016.  She had a repeat ablation 04/11/2017 with extensive left atrial ablation for multiple atypical atrial flutter's.  She required cardioversion to return to sinus rhythm.  She was discharged on flecainide. She had not had any Af/arrhythmias, main issue was orthopedic pains, following with a chiropractor. Maintained on flecainide/dilt, [place]  TODAY She does not thin she has had any Afib since on flecainide No CP She is getting winded unusually in the last couple months. She suspects is some weight gain, hot weather, but her Mom dies in her 77's with MI and her brother had an MI in his 31's and she worries about this knowing her lipids aren't great. No rest SOB, no difficulty with usual ADLs but getting SOB with carrying  groceries, longer walks if she has park farther away. This is new in the last 2 mo or so No dizzy spells, near syncope or syncope No bleeding, signs of bleeding   AFib/AAD hx Diagnosed about 2000 AAD Tikosyn, d/c Dec after ablation >> PRN Multaq PVI ablation 12/2015 CTI ablation 02/08/2017 PVI, Additional left atrial ablation was performed for left atrial flutter ablating along the left atrial roof, right superior pulmonary vein to mitral valve, and left inferior pulmonary vein to mitral valve, CTI ablation, 04/11/2017 Flecainide started 2018   Past Medical History:  Diagnosis Date   Arthritis    "probably in my knees; maybe in my hands" (04/11/2017)   Asthma attack 10/2015 X 1   Atrial  flutter ([place]) 10/2015   Atrial flutter with rapid ventricular response ([place]) 10/20/2015   Heart murmur    "comes and goes" (04/11/2017)   Hepatitis 1972   "don't know what kind"   High cholesterol    Hypothyroidism    Malignant melanoma ([place]) 10/1975   R leg as a teen   Malignant melanoma of leg ([place])    "right thigh"   PAF (paroxysmal atrial fibrillation) ([place])    Pneumonia 1966; 1967   "left lung collapsed one of these times"   Psoriasis    Rosacea    Vitamin D deficiency     Past Surgical History:  Procedure Laterality Date   A-FLUTTER ABLATION N/A 02/08/2017   Procedure: A-Flutter Ablation;  Surgeon: Constance Haw, MD;  Location: [facility];  Service: Cardiovascular;  Laterality: N/A;   ATRIAL FIBRILLATION ABLATION N/A 04/11/2017   Procedure: ATRIAL FIBRILLATION ABLATION;  Surgeon: Constance Haw, MD;  Location: [facility];  Service: Cardiovascular;  Laterality: N/A;   CARDIOVERSION N/A 10/25/2015   Procedure: CARDIOVERSION;  Surgeon: Sueanne Margarita, MD;  Location: [facility];  Service: Cardiovascular;  Laterality: N/A;   [place]   ELECTROPHYSIOLOGIC STUDY N/A 01/13/2016   Procedure: Atrial Fibrillation Ablation;  Surgeon: Will Meredith Leeds, MD;  Location: [facility];  Service: Cardiovascular;  Laterality: N/A;   ENDOMETRIAL ABLATION  ~ 2012   HYSTEROSCOPY  03/28/2020   Dr. Lanny Cramp  MELANOMA EXCISION Right 10/1975   outer thigh "malignant"   REFRACTIVE SURGERY Bilateral 1990s   TEE WITHOUT CARDIOVERSION N/A 10/25/2015   Procedure: TRANSESOPHAGEAL ECHOCARDIOGRAM (TEE);  Surgeon: Sueanne Margarita, MD;  Location: [facility];  Service: Cardiovascular;  Laterality: N/A;   TEE WITHOUT CARDIOVERSION N/A 01/11/2016   Procedure: TRANSESOPHAGEAL ECHOCARDIOGRAM (TEE);  Surgeon: Josue Hector, MD;  Location: [facility];  Service: Cardiovascular;  Laterality: N/A;   TONSILLECTOMY   1967   TUBAL LIGATION      Current Outpatient Medications  Medication Sig Dispense Refill   apixaban (ELIQUIS) 5 MG TABS tablet TAKE 1 TABLET BY MOUTH TWICE A DAY 60 tablet 0   Ascorbic Acid (VITAMIN C) 500 MG CHEW Chew by mouth. 2 times per day      augmented betamethasone dipropionate (DIPROLENE-AF) 0.05 % cream APPLY VERY SPARINGLY TO PSORIASIS RASH (DO NOT USE ON FACE ! ) 50 g 5   Black Pepper-Turmeric 3-500 MG CAPS Take by mouth.     Calcium 600-400 MG-UNIT CHEW Chew 1 each by mouth 2 (two) times daily.      Cholecalciferol (VITAMIN D PO) Take 10,000 Units by mouth daily.     Coenzyme Q10 (CO Q10) 100 MG CAPS Take 100 mg by mouth daily.      cromolyn (NASALCROM) 5.2 MG/ACT nasal spray Place 1 spray into both nostrils in the morning and at bedtime.     cyclobenzaprine (FLEXERIL) 5 MG tablet TAKE 1 TABLET BY MOUTH 3 TIMES DAILY AS NEEDED FOR MUSCLE SPASM 270 tablet 0   diltiazem (CARDIZEM CD) 120 MG 24 hr capsule TAKE 1 CAPSULE BY MOUTH EVERY DAY 90 capsule 2   DIVIGEL 0.5 MG/0.5GM GEL SMARTSIG:1 Packet(s) T-DERMAL Daily     ezetimibe (ZETIA) 10 MG tablet TAKE 1 TABLET DAILY FOR CHOLESTREROL 90 tablet 3   ferrous sulfate 324 MG TBEC Take 324 mg by mouth.     flecainide (TAMBOCOR) 100 MG tablet TAKE 1 TABLET 2 X /DAY (EVERY 12 HOURS) FOR AFIB 180 tablet 3   fluocinolone (SYNALAR) 0.01 % external solution See admin instructions.     furosemide (LASIX) 20 MG tablet TAKE 1 TABLET DAILY AS NEEDED FOR FLUID RETENTION 90 tablet 1   levothyroxine (SYNTHROID) 50 MCG tablet TAKE 1 TABLET DAILY ON EMPTY STOMACH WITH ONLY WATER FOR 30 MIN (NO ANTACID MEDS, CALCIUM OR MAGNESIUM FOR 4 HOURS & AVOID BIOTIN) 270 tablet 1   Magnesium 500 MG TABS Take 500 mg by mouth every evening.      Melatonin 1 MG CHEW Chew by mouth. Prn     montelukast (SINGULAIR) 10 MG tablet TAKE 1 TABLET BY MOUTH DAILY FOR ALLERGIES 90 tablet 3   Multiple Vitamin (MULTIVITAMIN WITH MINERALS) TABS tablet Take 1 tablet by mouth  daily.     OVER THE COUNTER MEDICATION in the morning and at bedtime. Taking Ferrous Sulfate 325 mg daily.     Polyethyl Glycol-Propyl Glycol (SYSTANE OP) Place 1 drop into both eyes daily.     Probiotic Product (PROBIOTIC DAILY PO) Take 1 tablet by mouth daily.      progesterone (PROMETRIUM) 100 MG capsule TAKE 1 CAPSULE BY MOUTH EVERY DAY 30 capsule 1   psyllium (METAMUCIL) 58.6 % powder Take 1 packet by mouth. prn     No current facility-administered medications for this visit.    Allergies:   Lipitor [atorvastatin], Metoprolol, Phentermine, Rythmol [propafenone], Sulfa drugs cross reactors, and Lisinopril   Social History:  The patient  reports  that she has never smoked. She has never used smokeless tobacco. She reports current alcohol use. She reports that she does not use drugs.   Family History:  The patient's family history includes Diabetes in her father; Heart attack in her brother and mother; Heart disease in her father and mother; Hypertension in her father and mother.  ROS:  Please see the history of present illness.    All other systems are reviewed and otherwise negative.   PHYSICAL EXAM:  VS:  LMP 10/29/2013 (Approximate)  BMI: There is no height or weight on file to calculate BMI. Well nourished, well developed, in no acute distress HEENT: normocephalic, atraumatic Neck: no JVD, carotid bruits or masses Cardiac:  RRR; 2/6 SM, no rubs, or gallops Lungs:  CTA b/l, no wheezing, rhonchi or rales Abd: soft, nontender MS: no deformity or atrophy Ext: no edema Skin: warm and dry, no rash Neuro:  No gross deficits appreciated Psych: euthymic mood, full affect   EKG:  Done today and reviewed by myself shows  SR 70bpm, PR 129m, QRS 1294m QTc 494   04/11/2017: EPS/ablation CONCLUSIONS: 1. Atrial fibrillation upon presentation.   2. Successful electrical isolation and anatomical encircling of all four pulmonary veins with radiofrequency current.  A WACA approach was  used 3. Additional left atrial ablation was performed for left atrial flutter ablating along the left atrial roof, right superior pulmonary vein to mitral valve, and left inferior pulmonary vein to mitral valve 4.  Successful ablation of the caval tricuspid isthmus to treat typical atrial flutter  5. Atrial fibrillation successfully cardioverted to sinus rhythm. 6. No early apparent complications.     02/08/2017: EPS/ablation  CONCLUSIONS:   1. Left atrial flutter upon presentation.   2. Successful radiofrequency ablation of atrial flutter along the cavotricuspid isthmus with complete bidirectional isthmus block achieved.   3. No inducible arrhythmias following ablation.   4. No early apparent complications.   12/19/2016: TTE Left ventricle: The cavity size was normal. Wall thickness was    normal. Systolic function was normal. The estimated ejection    fraction was in the range of 55% to 60%. Wall motion was normal;    there were no regional wall motion abnormalities. Features are    consistent with a pseudonormal left ventricular filling pattern,    with concomitant abnormal relaxation and increased filling    pressure (grade 2 diastolic dysfunction).  - Aortic valve: There was no stenosis.  - Mitral valve: There was mild regurgitation.  - Left atrium: The atrium was mildly dilated.  - Right ventricle: The cavity size was normal. Systolic function    was normal.  - Right atrium: The atrium was mildly dilated.  - Tricuspid valve: Peak RV-RA gradient (S): 32 mm Hg.  - Pulmonary arteries: PA peak pressure: 35 mm Hg (S).  - Inferior vena cava: The vessel was normal in size. The    respirophasic diameter changes were in the normal range (>= 50%),    consistent with normal central venous pressure.   Impressions:   - Normal LV size with EF 55-60%. Moderate diastolic dysfunction.    Normal RV size and systolic function. Mild mitral regurgitation.    Biatrial enlargement.   01/13/16:  EPS/ablation CONCLUSIONS: 1. Sinus rhythm upon presentation.   2. Successful electrical isolation and anatomical encircling of all four pulmonary veins with radiofrequency current. 3. No inducible arrhythmias following ablation both on and off of dobutamine 4. No early apparent complications.   TTE 10/26/15  Review of the above records today demonstrates:  - Left ventricle: The cavity size was normal. Systolic function was   moderately reduced. The estimated ejection fraction was in the   range of 35% to 40%. Diffuse hypokinesis. Features are consistent   with a pseudonormal left ventricular filling pattern, with   concomitant abnormal relaxation and increased filling pressure   (grade 2 diastolic dysfunction). Doppler parameters are   consistent with elevated ventricular end-diastolic filling   pressure. - Aortic valve: Trileaflet; normal thickness leaflets. There was no   regurgitation. - Aortic root: The aortic root was normal in size. - Ascending aorta: The ascending aorta was normal in size. - Mitral valve: There was mild regurgitation. - Left atrium: The atrium was mildly dilated. - Tricuspid valve: There was mild regurgitation. - Pulmonary arteries: Systolic pressure was mildly increased. PA   peak pressure: 38 mm Hg (S). - Inferior vena cava: The vessel was normal in size. - Pericardium, extracardiac: A trivial pericardial effusion was   identified posterior to the heart.  Recent Labs: 10/11/2021: ALT 19; BUN 25; Creat 0.90; Hemoglobin 14.2; Magnesium 2.5; Platelets 321; Potassium 4.5; Sodium 139; TSH 2.88  10/11/2021: Cholesterol 186; HDL 50; LDL Cholesterol (Calc) 98; Total CHOL/HDL Ratio 3.7; Triglycerides 269   CrCl cannot be calculated (Patient's most recent lab result is older than the maximum 21 days allowed.).   Wt Readings from Last 3 Encounters:  10/11/21 231 lb (104.8 kg)  05/04/21 234 lb 12.8 oz (106.5 kg)  02/03/21 235 lb 12.8 oz (107 kg)     Other studies  reviewed: Additional studies/records reviewed today include: summarized above  ASSESSMENT AND PLAN:  1. Persistent Afib 2. AFlutter today 3. ATach     CHA2DS2Vasc is at least 2, on Eliquis, appropriately dosed    Flecainide w/diltiazem    no burden by symptoms    stable intervals  3. HTN     Looks OK, no changes  4. DOE She had Ca++ score of zero in 2018 Low suspicion of obstructive CAD, though given family hx and likely need for hip surgery in the future reasonable to assess for CAD Plan for coronary CTA Advised to use the usual pre-CT metoprolol the day of her scan instead of her dilt, not both Murmur on exam, update her echo  Disposition: F/u with [place] otherwise in 73mo sooner if needed  Current medicines are reviewed at length with the patient today.  The patient did not have any concerns regarding medicines.  Monica Night PA-C 12/06/2021 7:19 AM     [facility] [address] [place] [phone](office)  [phone](fax)

## 2021-12-06 NOTE — Patient Instructions (Addendum)
Medication Instructions:  Your physician recommends that you continue on your current medications as directed. Please refer to the Current Medication list given to you today.  *If you need a refill on your cardiac medications before your next appointment, please call your pharmacy*  Lab Work: None ordered.  If you have labs (blood work) drawn today and your tests are completely normal, you will receive your results only by: [place] (if you have MyChart) OR A paper copy in the mail If you have any lab test that is abnormal or we need to change your treatment, we will call you to review the results.  Testing/Procedures: CORONARY CTA Your physician has requested that you have cardiac CT. Cardiac computed tomography (CT) is a painless test that uses an x-ray machine to take clear, detailed pictures of your heart. For further information please visit [email or web address][facility]tn. Please follow instruction sheet as given.    Follow-Up:  WITH RENEE URSUY PA-C IN 6 MONTHS, AT [place].   PLEASE SCHEDULE AN ECHOCARDIOGRAM AT [facility] ON [address]  PLEASE SCHEDULE A CORONARY CTA W/FFR FOR THE PATIENT.   Important Information About Sugar        Your cardiac CT will be scheduled   [facility] [place], [place] [address] [phone]   Please arrive at the [facility] (Entrance C2) of [facility] 30 minutes prior to test start time. You can use the FREE valet parking offered at entrance C (encouraged to control the heart rate for the test)  Proceed to the [facility] Radiology Department (first floor) to check-in and test prep.  All radiology patients and guests should use entrance C2 at [facility], accessed from [facility], even though the hospital's physical address listed is [address].      Please follow these instructions carefully (unless otherwise  directed):    On the Night Before the Test: Be sure to Drink plenty of water. Do not consume any caffeinated/decaffeinated beverages or chocolate 12 hours prior to your test. Do not take any antihistamines 12 hours prior to your test.  On the Day of the Test: Drink plenty of water until 1 hour prior to the test. Do not eat any food 4 hours prior to the test. You may take your regular medications prior to the test.  Take metoprolol (Lopressor) two hours prior to test. HOLD Furosemide morning of the test. FEMALES- please wear underwire-free bra if available, avoid dresses & tight clothing      After the Test: Drink plenty of water. After receiving IV contrast, you may experience a mild flushed feeling. This is normal. On occasion, you may experience a mild rash up to 24 hours after the test. This is not dangerous. If this occurs, you can take Benadryl 25 mg and increase your fluid intake. If you experience trouble breathing, this can be serious. If it is severe call 911 IMMEDIATELY. If it is mild, please call our office. If you take any of these medications: Glipizide/Metformin, Avandament, Glucavance, please do not take 48 hours after completing test unless otherwise instructed.  We will call to schedule your test 2-4 weeks out understanding that some insurance companies will need an authorization prior to the service being performed.   For non-scheduling related questions, please contact the cardiac imaging nurse navigator should you have any questions/concerns: Marchia Bond, Cardiac Imaging Nurse Navigator Gordy Clement, Cardiac Imaging Nurse Navigator [facility] Direct Office Dial: [phone]   For scheduling needs, including cancellations and rescheduling, please call [place], [phone].

## 2021-12-11 NOTE — Addendum Note (Signed)
Addended by: Janan Halter F on: 12/11/2021 05:55 PM   Modules accepted: Orders

## 2021-12-13 DIAGNOSIS — M9901 Segmental and somatic dysfunction of cervical region: Secondary | ICD-10-CM | POA: Diagnosis not present

## 2021-12-13 DIAGNOSIS — M9902 Segmental and somatic dysfunction of thoracic region: Secondary | ICD-10-CM | POA: Diagnosis not present

## 2021-12-13 DIAGNOSIS — M62830 Muscle spasm of back: Secondary | ICD-10-CM | POA: Diagnosis not present

## 2021-12-13 DIAGNOSIS — M9906 Segmental and somatic dysfunction of lower extremity: Secondary | ICD-10-CM | POA: Diagnosis not present

## 2021-12-13 DIAGNOSIS — M9903 Segmental and somatic dysfunction of lumbar region: Secondary | ICD-10-CM | POA: Diagnosis not present

## 2021-12-14 DIAGNOSIS — M1611 Unilateral primary osteoarthritis, right hip: Secondary | ICD-10-CM | POA: Diagnosis not present

## 2021-12-15 ENCOUNTER — Telehealth: Payer: Self-pay | Admitting: Cardiology

## 2021-12-15 NOTE — Telephone Encounter (Signed)
Due for echocardiogram on 9/11 and a coronary CT on 9/13 prior to cardiac clearance.  Clinical pharmacist to review Eliquis

## 2021-12-15 NOTE — Telephone Encounter (Signed)
   Pre-operative Risk Assessment    Patient Name: Monica Morgan  DOB: 01/17/1961 MRN: [id number]      Request for Surgical Clearance    Procedure:   Right Hip Replacement  Date of Surgery:  Clearance TBD                                 Surgeon:  Dr. Frederik Pear Surgeon's Group or Practice Name:  [facility]  Phone number:  [phone] Fax number:  [phone]   Type of Clearance Requested:   - Medical  - Pharmacy:  Hold Apixaban (Eliquis)     Type of Anesthesia:  Spinal   Additional requests/questions:  Need to know when and how long to stop Eliquis  Signed, April Henson   12/15/2021, 9:06 AM

## 2021-12-16 NOTE — Telephone Encounter (Signed)
Patient with diagnosis of Atrial fibrillation on Eliquis for anticoagulation.    Procedure: right hip replaement Date of procedure: TBD  CHA2DS2-VASc Score = 2   This indicates a 2.2% annual risk of stroke. The patient's score is based upon: CHF History: 0 HTN History: 1 Diabetes History: 0 Stroke History: 0 Vascular Disease History: 0 Age Score: 0 Gender Score: 1      CrCl 122 Platelet count 321  Per office protocol, patient can hold Eliquis for 3 days prior to procedure.   Patient will not need bridging with Lovenox (enoxaparin) around procedure.  **This guidance is not considered finalized until pre-operative APP has relayed final recommendations.**'

## 2021-12-19 ENCOUNTER — Other Ambulatory Visit: Payer: Self-pay | Admitting: Cardiology

## 2021-12-22 ENCOUNTER — Encounter: Payer: Self-pay | Admitting: Nurse Practitioner

## 2021-12-22 ENCOUNTER — Ambulatory Visit: Payer: BC Managed Care – PPO | Admitting: Nurse Practitioner

## 2021-12-22 VITALS — BP 140/78 | HR 73 | Temp 96.6°F | Ht 66.0 in | Wt 234.0 lb

## 2021-12-22 DIAGNOSIS — I10 Essential (primary) hypertension: Secondary | ICD-10-CM

## 2021-12-22 DIAGNOSIS — E039 Hypothyroidism, unspecified: Secondary | ICD-10-CM | POA: Diagnosis not present

## 2021-12-22 DIAGNOSIS — I4819 Other persistent atrial fibrillation: Secondary | ICD-10-CM | POA: Diagnosis not present

## 2021-12-22 DIAGNOSIS — R7309 Other abnormal glucose: Secondary | ICD-10-CM

## 2021-12-22 DIAGNOSIS — Z01818 Encounter for other preprocedural examination: Secondary | ICD-10-CM | POA: Diagnosis not present

## 2021-12-22 DIAGNOSIS — D72829 Elevated white blood cell count, unspecified: Secondary | ICD-10-CM

## 2021-12-22 DIAGNOSIS — Z79899 Other long term (current) drug therapy: Secondary | ICD-10-CM

## 2021-12-22 DIAGNOSIS — E559 Vitamin D deficiency, unspecified: Secondary | ICD-10-CM

## 2021-12-22 DIAGNOSIS — E782 Mixed hyperlipidemia: Secondary | ICD-10-CM

## 2021-12-22 NOTE — Progress Notes (Unsigned)
Surgical Clearance  Assessment and Plan:   Diagnoses and all orders for this visit:   Essential hypertension Continue medications Monitor blood pressure at home; call if consistently over 130/80 Continue DASH diet.   Reminder to go to the ER if any CP, SOB, nausea, dizziness, severe HA, changes vision/speech, left arm numbness and tingling and jaw pain.    Persistent atrial fibrillation ([place]) Rate controlled; cardiology following; continue elequis    Hypothyroidism, unspecified type continue medications the same pending lab results reminded to take on an empty stomach 30-3mns before food.     Morbid obesity ([place] - BMI 37 with htn, hld Long discussion about weight loss, diet, and exercise Recommended diet heavy in fruits and veggies and low in animal meats, cheeses, and dairy products, appropriate calorie intake Patient will work on portions, increase dietary fiber  Discussed appropriate weight for height     Prediabetes Discussed disease and risks Discussed diet/exercise, weight management     Hyperlipidemia, mixed Mild elevations working on lifestyle Continue low cholesterol diet and exercise.  Check lipid panel.    Vitamin D deficiency Continue supplement; check annually    Medication management All medications discussed and reviewed in full. All questions and concerns regarding medications addressed.      Leukocytosis, unspecified type Has seen hematology, no further workup recommended Monitor CBC    Pre-operative Risk Assessment     Patient Name: Monica Morgan DOB: [birth year]MRN: [id number]{  Request for Surgical Clearance{  Procedure:  Right Anterior Hip Arthroplasty { Date of Surgery:  Clearance TBD                      {   Surgeon:  Dr. FKathalene Frames RMayer Camel Surgeon's Group or Practice Name:  [facility]:  [phone] number:  [phone]  Type of Clearance Requested:  Medical. Cardiac 1}  Type of  Anesthesia:  Spinal { Additional requests/questions:  Pending Echo and CT Scan. Note Persistent atrial fibrillation, leukocytosis.   Signed, Accalia Rigdon   12/25/2021, 1:00 PM

## 2021-12-22 NOTE — Patient Instructions (Signed)
Protein Content in Foods Protein is a necessary nutrient in any diet. It helps build and repair muscles, bones, and skin. Depending on your overall health, you may need more or less protein in your diet. You are encouraged to eat a variety of protein foods to ensure that you get all the essential nutrients that are found in different protein foods. Talk with your health care provider or dietitian about how much protein you need each day and which sources of protein are best for you. Protein is especially important for: Repairing and making cells and tissues. Fighting infection. Providing energy. Growth and development. See the following list for the protein content of some common foods. What are tips for getting more protein in your diet? Try to replace processed carbohydrates with high-quality protein. Snack on nuts and seeds instead of chips. Replace baked desserts with [place] yogurt. Eat protein foods from both plant and animal sources. Replace red meat with seafood. Add beans and peas to salads, soups, and side dishes. Include a protein food with each meal and snack. Reading food labels You can find the amount of protein in a food item by looking at the nutrition facts label. Use the total grams listed to help you reach your daily goal. What foods are high in protein?  High-protein foods contain 4 grams (g) or more of protein per serving. They include: Grains Quinoa (cooked) -- 1 cup (185 g) has 8 g of protein. Whole wheat pasta (cooked) -- 1 cup (140 g) has 6 g of protein. Meat Beef, ground sirloin (cooked) -- 3 oz (85 g) has 24 g of protein. Chicken breast, boneless and skinless (cooked) -- 3 oz (85 g) has 25 g of protein. Egg -- 1 egg has 6 g of protein. Fish, filet (cooked) -- 1 oz (28 g) has 6-7 g of protein. Lamb (cooked) -- 3 oz (85 g) has 24 g of protein. Pork tenderloin (cooked) -- 3 oz (85 g) has 23 g of protein. Tuna (canned in water) -- 3 oz (85 g) has 20 g of  protein. Dairy Cottage cheese --  cup (114 g) has 13.4 g of protein. Milk -- 1 cup (237 mL) has 8 g of protein. Cheese (hard) -- 1 oz (28 g) has 7 g of protein. Yogurt, regular -- 6 oz (170 g) has 8 g of protein. Greek yogurt -- 6 oz (200 g) has 18 g protein. Plant protein Garbanzo beans (canned or cooked) --  cup (130 g) has 6-7 g of protein. Kidney beans (canned or cooked) --  cup (130 g) has 6-7 g of protein. Nuts (peanuts, pistachios, almonds) -- 1 oz (28 g) has 6 g of protein. Peanut butter -- 1 oz (32 g) has 7-8 g of protein. Pumpkin seeds -- 1 oz (28 g) has 8.5 g of protein. Soybeans (roasted) -- 1 oz (28 g) has 8 g of protein. Soybeans (cooked) --  cup (90 g) has 11 g of protein. Soy milk -- 1 cup (250 mL) has 5-10 g of protein. Soy or vegetable patty -- 1 patty has 11 g of protein. Sunflower seeds -- 1 oz (28 g) has 5.5 g of protein. Buckwheat -- 1 oz (33 g) has 4.3 g of protein. Tofu (firm) --  cup (124 g) has 20 g of protein. Tempeh --  cup (83 g) has 16 g of protein. The items listed above may not be a complete list of foods high in protein. Actual amounts of protein may differ  depending on processing. Contact a dietitian for more information. What foods are low in protein?  Low-protein foods contain 3 grams (g) or less of protein per serving. They include: Fruits Fruit or vegetable juice --  cup (125 mL) has 1 g of protein. Vegetables Beets (raw or cooked) --  cup (68 g) has 1.5 g of protein. Broccoli (raw or cooked) --  cup (44 g) has 2 g of protein. Collard greens (raw or cooked) --  cup (42 g) has 2 g of protein. Green beans (raw or cooked) --  cup (83 g) has 1 g of protein. Green peas (canned) --  cup (80 g) has 3.5 g of protein. Potato (baked with skin) -- 1 medium potato (173 g) has 3 g of protein. Spinach (cooked) --  cup (90 g) has 3 g of protein. Squash (cooked) --  cup (90 g) has 1.5 g of protein. Avocado -- 1 cup (146 g) has 2.7 g of  protein. Grains Bran cereal --  cup (30 g) has 2-3 g of protein. Bread -- 1 slice has 2.5 g of protein. Corn (fresh or cooked) --  cup (77 g) has 2 g of protein. Flour tortilla -- One 6-inch (15 cm) tortilla has 2.5 g of protein. Muffins -- 1 small muffin (2 oz or 57 g) has 3 g of protein. Oatmeal (cooked) --  cup (40 g) has 3 g of protein. Rice (cooked) --  cup (79 g) has 2.5-3.5 g of protein. Dairy Cream cheese -- 1 oz (29 g) has 2 g of protein. Creamer (half-and-half) -- 1 oz (29 mL) has 1 g of protein. Frozen yogurt --  cup (72 g) has 3 g of protein. Sour cream --  cup (75 g) has 2.5 g of protein. The items listed above may not be a complete list of foods low in protein. Actual amounts of protein may differ depending on processing. Contact a dietitian for more information. Summary Protein is a nutrient that your body needs for growth and development, repairing and making cells and tissues, fighting infection, and providing energy. Protein is in both plant and animal foods. Some of these foods have more protein than others. Depending on your overall health, you may need more or less protein in your diet. Talk to your health care provider about how much protein you need. This information is not intended to replace advice given to you by your health care provider. Make sure you discuss any questions you have with your health care provider. Document Revised: 03/07/2020 Document Reviewed: 03/07/2020 Elsevier Patient Education  [place].

## 2021-12-25 ENCOUNTER — Ambulatory Visit (HOSPITAL_COMMUNITY): Payer: BC Managed Care – PPO | Attending: Physician Assistant

## 2021-12-25 ENCOUNTER — Other Ambulatory Visit (HOSPITAL_COMMUNITY): Payer: BC Managed Care – PPO

## 2021-12-25 DIAGNOSIS — I10 Essential (primary) hypertension: Secondary | ICD-10-CM | POA: Diagnosis not present

## 2021-12-25 DIAGNOSIS — I4819 Other persistent atrial fibrillation: Secondary | ICD-10-CM | POA: Diagnosis not present

## 2021-12-25 LAB — ECHOCARDIOGRAM COMPLETE
Area-P 1/2: 3.63 cm2
S' Lateral: 3.3 cm

## 2021-12-25 NOTE — Progress Notes (Signed)
Surgical Clearance  Assessment and Plan:   Diagnoses and all orders for this visit:   Essential hypertension Continue medications Monitor blood pressure at home; call if consistently over 130/80 Continue DASH diet.   Reminder to go to the ER if any CP, SOB, nausea, dizziness, severe HA, changes vision/speech, left arm numbness and tingling and jaw pain.    Persistent atrial fibrillation ([place]) Rate controlled; cardiology following; continue elequis    Hypothyroidism, unspecified type continue medications the same pending lab results reminded to take on an empty stomach 30-58mns before food.     Morbid obesity ([place] - BMI 37 with htn, hld Long discussion about weight loss, diet, and exercise Recommended diet heavy in fruits and veggies and low in animal meats, cheeses, and dairy products, appropriate calorie intake Patient will work on portions, increase dietary fiber  Discussed appropriate weight for height     Prediabetes Discussed disease and risks Discussed diet/exercise, weight management     Hyperlipidemia, mixed Mild elevations working on lifestyle Continue low cholesterol diet and exercise.  Check lipid panel.    Vitamin D deficiency Continue supplement; check annually    Medication management All medications discussed and reviewed in full. All questions and concerns regarding medications addressed.      Leukocytosis, unspecified type Has seen hematology, no further workup recommended Monitor CBC    Pre-operative Risk Assessment     Patient Name: Monica Morgan DOB: [id number]: [id number]{  Request for Surgical Clearance{  Procedure:  Right Anterior Hip Arthroplasty { Date of Surgery:  Clearance TBD   Surgeon:  Dr. FKathalene Frames RMayer [facility] Surgeon's Group or Practice Name:  [facility] technologistnumber:  [phone] Fax number:  [phone] Type of Clearance Requested:  Medical, Cardiac  Type of Anesthesia:  Spinal  Additional  requests/questions:  Pending Echo and CT Scan. Note Persistent atrial fibrillation, leukocytosis.   Signed, Anjelika Ausburn   12/25/2021, 1:25 PM

## 2021-12-26 ENCOUNTER — Telehealth (HOSPITAL_COMMUNITY): Payer: Self-pay | Admitting: Emergency Medicine

## 2021-12-26 NOTE — Telephone Encounter (Signed)
Attempted to call patient regarding upcoming cardiac CT appointment. °Left message on voicemail with name and callback number °Jayd Forrey RN Navigator Cardiac Imaging °[facility] °[phone] Office °[phone] Cell ° °

## 2021-12-27 ENCOUNTER — Ambulatory Visit (HOSPITAL_BASED_OUTPATIENT_CLINIC_OR_DEPARTMENT_OTHER)
Admission: RE | Admit: 2021-12-27 | Discharge: 2021-12-27 | Disposition: A | Discharge status: Home / Self Care | Payer: BC Managed Care – PPO | Source: Ambulatory Visit | Attending: Cardiology | Admitting: Cardiology

## 2021-12-27 ENCOUNTER — Ambulatory Visit (HOSPITAL_COMMUNITY)
Admission: RE | Admit: 2021-12-27 | Discharge: 2021-12-27 | Disposition: A | Discharge status: Home / Self Care | Payer: BC Managed Care – PPO | Source: Ambulatory Visit | Attending: Cardiology | Admitting: Cardiology

## 2021-12-27 ENCOUNTER — Other Ambulatory Visit: Payer: Self-pay | Admitting: Cardiology

## 2021-12-27 ENCOUNTER — Ambulatory Visit (HOSPITAL_COMMUNITY)
Admission: RE | Admit: 2021-12-27 | Discharge: 2021-12-27 | Disposition: A | Discharge status: Home / Self Care | Payer: BC Managed Care – PPO | Source: Ambulatory Visit | Attending: Physician Assistant | Admitting: Physician Assistant

## 2021-12-27 ENCOUNTER — Encounter (HOSPITAL_COMMUNITY): Payer: Self-pay

## 2021-12-27 DIAGNOSIS — R931 Abnormal findings on diagnostic imaging of heart and coronary circulation: Secondary | ICD-10-CM

## 2021-12-27 DIAGNOSIS — I2510 Atherosclerotic heart disease of native coronary artery without angina pectoris: Secondary | ICD-10-CM | POA: Diagnosis not present

## 2021-12-27 DIAGNOSIS — I4819 Other persistent atrial fibrillation: Secondary | ICD-10-CM | POA: Insufficient documentation

## 2021-12-27 DIAGNOSIS — I10 Essential (primary) hypertension: Secondary | ICD-10-CM | POA: Diagnosis not present

## 2021-12-27 MED ORDER — NITROGLYCERIN 0.4 MG SL SUBL
0.8000 mg | SUBLINGUAL_TABLET | Freq: Once | SUBLINGUAL | Status: AC
  Administered 2021-12-27: 0.8 mg via SUBLINGUAL

## 2021-12-27 MED ORDER — NITROGLYCERIN 0.4 MG SL SUBL
SUBLINGUAL_TABLET | SUBLINGUAL | Status: AC
  Filled 2021-12-27: qty 2

## 2021-12-27 MED ORDER — IOHEXOL 350 MG/ML SOLN
100.0000 mL | Freq: Once | INTRAVENOUS | Status: AC | PRN
  Administered 2021-12-27: 100 mL via INTRAVENOUS

## 2021-12-28 NOTE — Telephone Encounter (Signed)
Coronary CT noted no obstructive CAD and echo with preserved LVEF with no significant VHD.   No cardiac contraindication to hip surgery.  Pharmacy team as addressed her anticoagulation  Tommye Standard, PA-C

## 2021-12-28 NOTE — Telephone Encounter (Signed)
   Patient Name: Monica Morgan  DOB: [birth year] MRN: [id number]  Primary Cardiologist: Constance Haw, MD  Chart revisited as part of pre-operative protocol coverage.  Per Tommye Standard PA-C who evaluated patient recently, "Coronary CT noted no obstructive CAD and echo with preserved LVEF with no significant VHD.   No cardiac contraindication to hip surgery."  Regarding anticoagulation, per pharmD, "Per office protocol, patient can hold Eliquis for 3 days prior to procedure.   Patient will not need bridging with Lovenox (enoxaparin) around procedure."  Will route this bundled recommendation to requesting provider via Epic fax function. Please call with questions.  Charlie Pitter, PA-C 12/28/2021, 3:38 PM

## 2021-12-28 NOTE — Telephone Encounter (Signed)
Covering preop today. Patient recently saw Tommye Standard for eval with echo/cor CT planned which have been performed in anticipation of upcoming hip surgery. Will route to [facility] for final input on surgical clearance. Renee- Please route response to [facility] PREOP (the pre-op pool). Thank you.

## 2021-12-29 ENCOUNTER — Other Ambulatory Visit: Payer: Self-pay | Admitting: Cardiology

## 2021-12-29 DIAGNOSIS — I4819 Other persistent atrial fibrillation: Secondary | ICD-10-CM

## 2021-12-29 NOTE — Telephone Encounter (Signed)
Prescription refill request for Eliquis received. Indication: Afib  Last office visit:12/06/21 Charlcie Cradle) Scr: 0.90 (10/11/21) Age: 61 Weight: 106.1kg  Appropriate dose and refill sent to requested pharmacy.

## 2022-01-01 DIAGNOSIS — M9902 Segmental and somatic dysfunction of thoracic region: Secondary | ICD-10-CM | POA: Diagnosis not present

## 2022-01-01 DIAGNOSIS — M9901 Segmental and somatic dysfunction of cervical region: Secondary | ICD-10-CM | POA: Diagnosis not present

## 2022-01-01 DIAGNOSIS — M9906 Segmental and somatic dysfunction of lower extremity: Secondary | ICD-10-CM | POA: Diagnosis not present

## 2022-01-01 DIAGNOSIS — M62830 Muscle spasm of back: Secondary | ICD-10-CM | POA: Diagnosis not present

## 2022-01-01 DIAGNOSIS — M9903 Segmental and somatic dysfunction of lumbar region: Secondary | ICD-10-CM | POA: Diagnosis not present

## 2022-01-03 DIAGNOSIS — M62830 Muscle spasm of back: Secondary | ICD-10-CM | POA: Diagnosis not present

## 2022-01-03 DIAGNOSIS — M9902 Segmental and somatic dysfunction of thoracic region: Secondary | ICD-10-CM | POA: Diagnosis not present

## 2022-01-03 DIAGNOSIS — M9903 Segmental and somatic dysfunction of lumbar region: Secondary | ICD-10-CM | POA: Diagnosis not present

## 2022-01-03 DIAGNOSIS — M9901 Segmental and somatic dysfunction of cervical region: Secondary | ICD-10-CM | POA: Diagnosis not present

## 2022-01-03 DIAGNOSIS — M9906 Segmental and somatic dysfunction of lower extremity: Secondary | ICD-10-CM | POA: Diagnosis not present

## 2022-01-18 DIAGNOSIS — M25651 Stiffness of right hip, not elsewhere classified: Secondary | ICD-10-CM | POA: Diagnosis not present

## 2022-01-18 DIAGNOSIS — R262 Difficulty in walking, not elsewhere classified: Secondary | ICD-10-CM | POA: Diagnosis not present

## 2022-01-18 DIAGNOSIS — M1611 Unilateral primary osteoarthritis, right hip: Secondary | ICD-10-CM | POA: Diagnosis not present

## 2022-01-23 ENCOUNTER — Other Ambulatory Visit: Payer: Self-pay | Admitting: Internal Medicine

## 2022-01-23 DIAGNOSIS — Z1231 Encounter for screening mammogram for malignant neoplasm of breast: Secondary | ICD-10-CM

## 2022-01-24 DIAGNOSIS — M25551 Pain in right hip: Secondary | ICD-10-CM | POA: Diagnosis not present

## 2022-01-24 DIAGNOSIS — M1611 Unilateral primary osteoarthritis, right hip: Secondary | ICD-10-CM | POA: Diagnosis not present

## 2022-01-30 DIAGNOSIS — Z08 Encounter for follow-up examination after completed treatment for malignant neoplasm: Secondary | ICD-10-CM | POA: Diagnosis not present

## 2022-01-30 DIAGNOSIS — Z85820 Personal history of malignant melanoma of skin: Secondary | ICD-10-CM | POA: Diagnosis not present

## 2022-01-30 DIAGNOSIS — L821 Other seborrheic keratosis: Secondary | ICD-10-CM | POA: Diagnosis not present

## 2022-02-02 DIAGNOSIS — M1611 Unilateral primary osteoarthritis, right hip: Secondary | ICD-10-CM | POA: Diagnosis not present

## 2022-02-06 DIAGNOSIS — Z96641 Presence of right artificial hip joint: Secondary | ICD-10-CM | POA: Diagnosis not present

## 2022-02-06 DIAGNOSIS — M25651 Stiffness of right hip, not elsewhere classified: Secondary | ICD-10-CM | POA: Diagnosis not present

## 2022-02-06 DIAGNOSIS — R531 Weakness: Secondary | ICD-10-CM | POA: Diagnosis not present

## 2022-02-13 DIAGNOSIS — R531 Weakness: Secondary | ICD-10-CM | POA: Diagnosis not present

## 2022-02-13 DIAGNOSIS — Z96641 Presence of right artificial hip joint: Secondary | ICD-10-CM | POA: Diagnosis not present

## 2022-02-13 DIAGNOSIS — M25651 Stiffness of right hip, not elsewhere classified: Secondary | ICD-10-CM | POA: Diagnosis not present

## 2022-02-16 DIAGNOSIS — M9902 Segmental and somatic dysfunction of thoracic region: Secondary | ICD-10-CM | POA: Diagnosis not present

## 2022-02-16 DIAGNOSIS — M62830 Muscle spasm of back: Secondary | ICD-10-CM | POA: Diagnosis not present

## 2022-02-16 DIAGNOSIS — M9901 Segmental and somatic dysfunction of cervical region: Secondary | ICD-10-CM | POA: Diagnosis not present

## 2022-02-16 DIAGNOSIS — M9903 Segmental and somatic dysfunction of lumbar region: Secondary | ICD-10-CM | POA: Diagnosis not present

## 2022-02-20 DIAGNOSIS — R531 Weakness: Secondary | ICD-10-CM | POA: Diagnosis not present

## 2022-02-20 DIAGNOSIS — Z96641 Presence of right artificial hip joint: Secondary | ICD-10-CM | POA: Diagnosis not present

## 2022-02-20 DIAGNOSIS — M25651 Stiffness of right hip, not elsewhere classified: Secondary | ICD-10-CM | POA: Diagnosis not present

## 2022-02-28 DIAGNOSIS — R531 Weakness: Secondary | ICD-10-CM | POA: Diagnosis not present

## 2022-02-28 DIAGNOSIS — M25651 Stiffness of right hip, not elsewhere classified: Secondary | ICD-10-CM | POA: Diagnosis not present

## 2022-02-28 DIAGNOSIS — Z96641 Presence of right artificial hip joint: Secondary | ICD-10-CM | POA: Diagnosis not present

## 2022-03-05 ENCOUNTER — Ambulatory Visit
Admission: RE | Admit: 2022-03-05 | Discharge: 2022-03-05 | Disposition: A | Discharge status: Home / Self Care | Payer: BC Managed Care – PPO | Source: Ambulatory Visit | Attending: Internal Medicine | Admitting: Internal Medicine

## 2022-03-05 DIAGNOSIS — Z1231 Encounter for screening mammogram for malignant neoplasm of breast: Secondary | ICD-10-CM | POA: Diagnosis not present

## 2022-03-06 DIAGNOSIS — Z96641 Presence of right artificial hip joint: Secondary | ICD-10-CM | POA: Diagnosis not present

## 2022-03-06 DIAGNOSIS — M25651 Stiffness of right hip, not elsewhere classified: Secondary | ICD-10-CM | POA: Diagnosis not present

## 2022-03-06 DIAGNOSIS — R531 Weakness: Secondary | ICD-10-CM | POA: Diagnosis not present

## 2022-03-15 DIAGNOSIS — M9901 Segmental and somatic dysfunction of cervical region: Secondary | ICD-10-CM | POA: Diagnosis not present

## 2022-03-15 DIAGNOSIS — M9902 Segmental and somatic dysfunction of thoracic region: Secondary | ICD-10-CM | POA: Diagnosis not present

## 2022-03-15 DIAGNOSIS — M62830 Muscle spasm of back: Secondary | ICD-10-CM | POA: Diagnosis not present

## 2022-03-15 DIAGNOSIS — M9903 Segmental and somatic dysfunction of lumbar region: Secondary | ICD-10-CM | POA: Diagnosis not present

## 2022-03-21 ENCOUNTER — Other Ambulatory Visit: Payer: Self-pay | Admitting: Internal Medicine

## 2022-03-22 ENCOUNTER — Other Ambulatory Visit: Payer: Self-pay

## 2022-03-22 MED ORDER — LEVOTHYROXINE SODIUM 50 MCG PO TABS: ORAL_TABLET | ORAL | 1 refills | Status: DC

## 2022-03-26 DIAGNOSIS — M5382 Other specified dorsopathies, cervical region: Secondary | ICD-10-CM | POA: Diagnosis not present

## 2022-03-26 DIAGNOSIS — M62830 Muscle spasm of back: Secondary | ICD-10-CM | POA: Diagnosis not present

## 2022-03-26 DIAGNOSIS — M9902 Segmental and somatic dysfunction of thoracic region: Secondary | ICD-10-CM | POA: Diagnosis not present

## 2022-03-26 DIAGNOSIS — M9901 Segmental and somatic dysfunction of cervical region: Secondary | ICD-10-CM | POA: Diagnosis not present

## 2022-03-26 DIAGNOSIS — M9903 Segmental and somatic dysfunction of lumbar region: Secondary | ICD-10-CM | POA: Diagnosis not present

## 2022-04-02 DIAGNOSIS — H40013 Open angle with borderline findings, low risk, bilateral: Secondary | ICD-10-CM | POA: Diagnosis not present

## 2022-04-02 DIAGNOSIS — H2513 Age-related nuclear cataract, bilateral: Secondary | ICD-10-CM | POA: Diagnosis not present

## 2022-04-02 DIAGNOSIS — H35412 Lattice degeneration of retina, left eye: Secondary | ICD-10-CM | POA: Diagnosis not present

## 2022-04-10 DIAGNOSIS — M62830 Muscle spasm of back: Secondary | ICD-10-CM | POA: Diagnosis not present

## 2022-04-10 DIAGNOSIS — M9901 Segmental and somatic dysfunction of cervical region: Secondary | ICD-10-CM | POA: Diagnosis not present

## 2022-04-10 DIAGNOSIS — M5382 Other specified dorsopathies, cervical region: Secondary | ICD-10-CM | POA: Diagnosis not present

## 2022-04-10 DIAGNOSIS — M9903 Segmental and somatic dysfunction of lumbar region: Secondary | ICD-10-CM | POA: Diagnosis not present

## 2022-04-10 DIAGNOSIS — M9902 Segmental and somatic dysfunction of thoracic region: Secondary | ICD-10-CM | POA: Diagnosis not present

## 2022-05-08 ENCOUNTER — Ambulatory Visit (INDEPENDENT_AMBULATORY_CARE_PROVIDER_SITE_OTHER): Payer: BC Managed Care – PPO | Admitting: Nurse Practitioner

## 2022-05-08 ENCOUNTER — Encounter: Payer: Self-pay | Admitting: Nurse Practitioner

## 2022-05-08 VITALS — BP 140/70 | HR 62 | Temp 97.3°F | Ht 65.25 in | Wt 229.8 lb

## 2022-05-08 DIAGNOSIS — E039 Hypothyroidism, unspecified: Secondary | ICD-10-CM

## 2022-05-08 DIAGNOSIS — Z131 Encounter for screening for diabetes mellitus: Secondary | ICD-10-CM | POA: Diagnosis not present

## 2022-05-08 DIAGNOSIS — E042 Nontoxic multinodular goiter: Secondary | ICD-10-CM

## 2022-05-08 DIAGNOSIS — R6889 Other general symptoms and signs: Secondary | ICD-10-CM | POA: Diagnosis not present

## 2022-05-08 DIAGNOSIS — Z13220 Encounter for screening for lipoid disorders: Secondary | ICD-10-CM | POA: Diagnosis not present

## 2022-05-08 DIAGNOSIS — R7309 Other abnormal glucose: Secondary | ICD-10-CM

## 2022-05-08 DIAGNOSIS — Z85820 Personal history of malignant melanoma of skin: Secondary | ICD-10-CM

## 2022-05-08 DIAGNOSIS — I4819 Other persistent atrial fibrillation: Secondary | ICD-10-CM

## 2022-05-08 DIAGNOSIS — H6121 Impacted cerumen, right ear: Secondary | ICD-10-CM

## 2022-05-08 DIAGNOSIS — Z1389 Encounter for screening for other disorder: Secondary | ICD-10-CM

## 2022-05-08 DIAGNOSIS — Z79899 Other long term (current) drug therapy: Secondary | ICD-10-CM | POA: Diagnosis not present

## 2022-05-08 DIAGNOSIS — E782 Mixed hyperlipidemia: Secondary | ICD-10-CM

## 2022-05-08 DIAGNOSIS — Z1211 Encounter for screening for malignant neoplasm of colon: Secondary | ICD-10-CM

## 2022-05-08 DIAGNOSIS — M65341 Trigger finger, right ring finger: Secondary | ICD-10-CM

## 2022-05-08 DIAGNOSIS — E559 Vitamin D deficiency, unspecified: Secondary | ICD-10-CM | POA: Diagnosis not present

## 2022-05-08 DIAGNOSIS — Z0001 Encounter for general adult medical examination with abnormal findings: Secondary | ICD-10-CM | POA: Diagnosis not present

## 2022-05-08 DIAGNOSIS — I10 Essential (primary) hypertension: Secondary | ICD-10-CM

## 2022-05-08 DIAGNOSIS — D72829 Elevated white blood cell count, unspecified: Secondary | ICD-10-CM

## 2022-05-08 DIAGNOSIS — L409 Psoriasis, unspecified: Secondary | ICD-10-CM

## 2022-05-08 DIAGNOSIS — R490 Dysphonia: Secondary | ICD-10-CM

## 2022-05-08 DIAGNOSIS — G5601 Carpal tunnel syndrome, right upper limb: Secondary | ICD-10-CM

## 2022-05-08 NOTE — Patient Instructions (Signed)
Trigger Finger  Trigger finger, also called stenosing tenosynovitis,  is a condition that causes a finger to get stuck in a bent position. Each finger has a tendon, which is a tough, cord-like tissue that connects muscle to bone, and each tendon passes through a tunnel of tissue called a tendon sheath. To move your finger, your tendon needs to glide freely through the sheath. Trigger finger happens when the tendon or the sheath thickens, making it difficult to move your finger. Trigger finger can affect any finger or a thumb. It may affect more than one finger. Mild cases may clear up with rest and medicine. Severe cases require more treatment. What are the causes? Trigger finger is caused by a thickened finger tendon or tendon sheath. The cause of this thickening is not known. What increases the risk? The following factors may make you more likely to develop this condition: Doing activities that require a strong grip. Having rheumatoid arthritis, gout, or diabetes. Being 17-51 years old. Being female. What are the signs or symptoms? Symptoms of this condition include: Pain when bending or straightening your finger. Tenderness or swelling where your finger attaches to the palm of your hand. A lump in the palm of your hand or on the inside of your finger. Hearing a noise like a pop or a snap when you try to straighten your finger. Feeling a catching or locking sensation when you try to straighten your finger. Being unable to straighten your finger. How is this diagnosed? This condition is diagnosed based on your symptoms and a physical exam. How is this treated? This condition may be treated by: Resting your finger and avoiding activities that make symptoms worse. Wearing a finger splint to keep your finger extended. Taking NSAIDs, such as ibuprofen, to relieve pain and swelling. Doing gentle exercises to stretch the finger as told by your health care provider. Having medicine that reduces  swelling and inflammation (steroids) injected into the tendon sheath. Injections may need to be repeated. Having surgery to open the tendon sheath. This may be done if other treatments do not work and you cannot straighten your finger. You may need physical therapy after surgery. Follow these instructions at home: If you have a splint: Wear the splint as told by your health care provider. Remove it only as told by your health care provider. Loosen it if your fingers tingle, become numb, or turn cold and blue. Keep it clean. If the splint is not waterproof: Do not let it get wet. Cover it with a watertight covering when you take a bath or shower. Managing pain, stiffness, and swelling     If directed, apply heat to the affected area as often as told by your health care provider. Use the heat source that your health care provider recommends, such as a moist heat pack or a heating pad. Place a towel between your skin and the heat source. Leave the heat on for 20-30 minutes. Remove the heat if your skin turns bright red. This is especially important if you are unable to feel pain, heat, or cold. You may have a greater risk of getting burned. If directed, put ice on the painful area. To do this: If you have a removable splint, remove it as told by your health care provider. Put ice in a plastic bag. Place a towel between your skin and the bag or between your splint and the bag. Leave the ice on for 20 minutes, 2-3 times a day.  Activity Rest  your finger as told by your health care provider. Avoid activities that make the pain worse. Return to your normal activities as told by your health care provider. Ask your health care provider what activities are safe for you. Do exercises as told by your health care provider. Ask your health care provider when it is safe to drive if you have a splint on your hand. General instructions Take over-the-counter and prescription medicines only as told by  your health care provider. Keep all follow-up visits as told by your health care provider. This is important. Contact a health care provider if: Your symptoms are not improving with home care. Summary Trigger finger, also called stenosing tenosynovitis, causes your finger to get stuck in a bent position. This can make it difficult and painful to straighten your finger. This condition develops when a finger tendon or tendon sheath thickens. Treatment may include resting your finger, wearing a splint, and taking medicines. In severe cases, surgery to open the tendon sheath may be needed. This information is not intended to replace advice given to you by your health care provider. Make sure you discuss any questions you have with your health care provider. Document Revised: 08/18/2018 Document Reviewed: 08/18/2018 Elsevier Patient Education  [place].

## 2022-05-08 NOTE — Progress Notes (Signed)
Complete Physical  Assessment and Plan:  Monica Morgan was seen today for annual exam.  Diagnoses and all orders for this visit:  Encounter for Annual Physical Exam with abnormal findings Due annually  Health Maintenance reviewed Healthy lifestyle reviewed and goals set  Essential hypertension Discussed DASH (Dietary Approaches to Stop Hypertension) DASH diet is lower in sodium than a typical American diet. Cut back on foods that are high in saturated fat, cholesterol, and trans fats. Eat more whole-grain foods, fish, poultry, and nuts Remain active and exercise as tolerated daily.  Monitor BP at home-Call if greater than 130/80.  Check CMP/CBC   Persistent atrial fibrillation ([place]) Rate controlled; cardiology following; Continue elequis   Hypothyroidism, unspecified type Controlled. Continue Levothyroxine. Reminded to take on an empty stomach 30-13mns before food.  Stop any Biotin Supplement 48-72 hours before next TSH level to reduce the risk of falsely low TSH levels. Continue to monitor.     Multinodular thyroid Normal biopsy in 02/2020; no further follow up was recommended  Psoriasis of scalp Treat PRN; avoid triggers; has seen rheum  Morbid obesity ([place] - BMI 37 with htn, hld Discussed appropriate BMI Goal of losing 1 lb per month. Diet modification. Physical activity. Encouraged/praised to build confidence.   Hyperlipidemia, mixed Discussed lifestyle modifications. Recommended diet heavy in fruits and veggies, omega 3's. Decrease consumption of animal meats, cheeses, and dairy products. Remain active and exercise as tolerated. Continue to monitor. Check lipids/TSH   Vitamin D deficiency Continue supplement; check annually  Monitor levels   Medication management All medications discussed and reviewed in full. All questions and concerns regarding medications addressed.     Abnormal glucose Education: Reviewed 'ABCs' of diabetes management  Discussed  goals to be met and/or maintained include A1C (<7) Blood pressure (<130/80) Cholesterol (LDL <70) Continue Eye Exam yearly  Continue Dental Exam Q6 mo Discussed dietary recommendations Discussed Physical Activity recommendations Check A1C   Screening for diabetes mellitus -     Hemoglobin A1c  Screening for hematuria or proteinuria -     Microalbumin / creatinine urine ratio -     Urinalysis, Routine w reflex microscopic  Leukocytosis, unspecified type Has seen hematology, no furhter workup recommended  History of malignant melanoma Encouraged close follow up with derm No areas of concern today - yearly   Frequent hoarseness Improved Continue to monitor  Impacted cerumen right Ceruminosis is noted.  Wax is removed by syringing and manual debridement. Instructions for home care to prevent wax buildup are given.  Right carpel tunnel/trigger finger Apply voltaren gel Apply bracing Continue to monitor  Screening for colon cancer Cologuard ordered  Orders Placed This Encounter  Procedures   CBC with Differential/Platelet   COMPLETE METABOLIC PANEL WITH GFR   Magnesium   Lipid panel   TSH   Hemoglobin A1c   Insulin, random   VITAMIN D 25 Hydroxy (Vit-D Deficiency, Fractures)   Urinalysis, Routine w reflex microscopic   Microalbumin / creatinine urine ratio   Cologuard   Ear Lavage      Notify office for further evaluation and treatment, questions or concerns if any reported s/s fail to improve.   The patient was advised to call back or seek an in-person evaluation if any symptoms worsen or if the condition fails to improve as anticipated.   Further disposition pending results of labs. Discussed med's effects and SE's.    I discussed the assessment and treatment plan with the patient. The patient was provided an opportunity to ask  questions and all were answered. The patient agreed with the plan and demonstrated an understanding of the  instructions.  Discussed med's effects and SE's. Screening labs and tests as requested with regular follow-up as recommended.  I provided 40 minutes of face-to-face time during this encounter including counseling, chart review, and critical decision making was preformed.   Future Appointments  Date Time Provider [place]  05/09/2023  3:00 PM Monica Morgan [facility] None     HPI  62 y.o. female  presents for a complete physical and follow up for has Hyperlipidemia, mixed; Vitamin D deficiency; Medication management; Hypothyroidism; Abnormal glucose; Morbid obesity ([place]); Leukocytosis; Persistent atrial fibrillation ([place]); Psoriasis of scalp; Essential hypertension; Multinodular thyroid; and History of malignant melanoma on their problem list.  Overall she reports feeling well.   She had a right hip replacement with [facility], Monica Morgan.  She has completed PT and is now with a walking group from work.    Has had increase in right right ring finger locking and pain that radiates down to palm area.  Types daily for work.  Has not implement any therapies at this time.    Follows with Monica Morgan Monica Morgan, on Progesterone.  Has f/u next month 05/2022. Gets mammogram annually at the [facility] center.   Follows with chiropractor for mid back strain, knee and hip pain, improving with adjustments, doing a tens unit. Likes to walk in pool when weather permits.   She has hx of psoriasis of scalp, has seen rheum, doing topicals PRN only.   BMI is Body mass index is 37.95 kg/m., she has been working on diet, trying to eat the right things, watching portions.  Poor candidate phentermine due to heart, wellbutrin caused very dry mouth without significant benefit. Unable to take GLP-1 d/t hx of thyroid nodules.  Wt Readings from Last 3 Encounters:  05/08/22 229 lb 12.8 oz (104.2 kg)  12/22/21 234 lb (106.1 kg)  12/06/21 235 lb (106.6 kg)   She has a. Fib and hx of flutter  with RVR, had ablations in 2017/2018 by Monica Morgan. She remains on Eliquis for CHA2DsVASc 2. Taking diltiazem 120 mg daily and flecainide. Continues to follow annually.   Her blood pressure has been controlled at home, today their BP is BP: (!) 140/70 She does not workout, but reports fairly active around the home, but does plan to walk in pool once warmer. She denies chest pain, shortness of breath, dizziness.   She is not on cholesterol medication and denies myalgias. Her cholesterol is not at goal, working on lifestyle. The cholesterol last visit was:   Lab Results  Component Value Date   CHOL 186 10/11/2021   HDL 50 10/11/2021   LDLCALC 98 10/11/2021   TRIG 269 (H) 10/11/2021   CHOLHDL 3.7 10/11/2021   She has been working on diet and exercise for glucose management and denies nausea, paresthesia of the feet, polydipsia, polyuria, visual disturbances and vomiting. Last A1C in the office was:  Lab Results  Component Value Date   HGBA1C 5.6 10/11/2021    Last GFR: Lab Results  Component Value Date   GFRNONAA 64 10/06/2020   She has hx of multinodular thyroid goiter, recently left inferior nodule showed growth and underwent FNA on 03/08/2020 which resulted bethesda II. No other nodules were felt concerning and no further follow up was recommended by radiology.   She is on thyroid medication for hypothyroid. Her medication was not changed last visit.  [place]  mcg daily.  Lab Results  Component Value Date   TSH 2.88 10/11/2021   Patient is on Vitamin D supplement.   Lab Results  Component Value Date   VD25OH 41 01/11/2021     She has had a mildly elevated white blood cell count since at least October 2017 ranging from 10.8-14.6. She was referred to hematology  for further evaluation.  Peripheral blood flow cytometry and BCR-ABL mutation are negative and recommended no further work up or follow up by Monica. Grayland Ormond.     Latest Ref Rng & Units 10/11/2021    4:47 PM 05/04/2021    3:48 PM  01/24/2021    3:45 PM  CBC  WBC 3.8 - 10.8 Thousand/uL 13.1  11.2  12.5   Hemoglobin 11.7 - 15.5 g/dL 14.2  14.0  14.2   Hematocrit 35.0 - 45.0 % 42.5  41.8  42.7   Platelets 140 - 400 Thousand/uL 321  336  319    Chronic recurrent iron def anemia, on iron slow release 3 times/day with less fatigue. Had negative workup by hematology and was recommended oral supplement. Had negative cologuard in 06/17/2019.  Lab Results  Component Value Date   IRON 65 05/04/2021   TIBC 335 05/04/2021   FERRITIN 171 05/04/2021   Lab Results  Component Value Date   VITAMINB12 1,134 (H) 01/24/2021     Current Medications:  Current Outpatient Medications on File Prior to Visit  Medication Sig Dispense Refill   apixaban (ELIQUIS) 5 MG TABS tablet TAKE 1 TABLET BY MOUTH TWICE A DAY 180 tablet 1   Ascorbic Acid (VITAMIN C) 500 MG CHEW Chew by mouth. 2 times per day      augmented betamethasone dipropionate (DIPROLENE-AF) 0.05 % cream APPLY VERY SPARINGLY TO PSORIASIS RASH (DO NOT USE ON FACE ! ) 50 g 5   Black Pepper-Turmeric 3-500 MG CAPS Take by mouth.     Calcium 600-400 MG-UNIT CHEW Chew 1 each by mouth 2 (two) times daily.      Cholecalciferol (VITAMIN D PO) Take 10,000 Units by mouth daily.     Coenzyme Q10 (CO Q10) 100 MG CAPS Take 100 mg by mouth daily.      cromolyn (NASALCROM) 5.2 MG/ACT nasal spray Place 1 spray into both nostrils in the morning and at bedtime.     cyclobenzaprine (FLEXERIL) 5 MG tablet TAKE 1 TABLET BY MOUTH 3 TIMES DAILY AS NEEDED FOR MUSCLE SPASM 270 tablet 0   diltiazem (CARDIZEM CD) 120 MG 24 hr capsule TAKE 1 CAPSULE BY MOUTH EVERY DAY 90 capsule 3   DIVIGEL 0.5 MG/0.5GM GEL SMARTSIG:1 Packet(s) T-DERMAL Daily     ezetimibe (ZETIA) 10 MG tablet TAKE 1 TABLET DAILY FOR CHOLESTREROL 90 tablet 3   ferrous sulfate 324 MG TBEC Take 324 mg by mouth.     flecainide (TAMBOCOR) 100 MG tablet TAKE 1 TABLET 2 X /DAY (EVERY 12 HOURS) FOR AFIB 180 tablet 3   fluocinolone (SYNALAR)  0.01 % external solution See admin instructions.     furosemide (LASIX) 20 MG tablet TAKE 1 TABLET DAILY AS NEEDED FOR FLUID RETENTION 90 tablet 1   levothyroxine (SYNTHROID) 50 MCG tablet TAKE 1 TABLET DAILY ON EMPTY STOMACH WITH ONLY WATER FOR 30 MIN (NO ANTACID MEDS, CALCIUM OR MAGNESIUM FOR 4 HOURS & AVOID BIOTIN) 270 tablet 1   Magnesium 500 MG TABS Take 500 mg by mouth every evening.      Melatonin 1 MG CHEW Chew by mouth. Prn  metoprolol tartrate (LOPRESSOR) 100 MG tablet TAKE ONE TABLET BY MOUTH 2 HOURS PRIOR TO SCAN 1 tablet 0   montelukast (SINGULAIR) 10 MG tablet TAKE 1 TABLET BY MOUTH DAILY FOR ALLERGIES 90 tablet 3   Multiple Vitamin (MULTIVITAMIN WITH MINERALS) TABS tablet Take 1 tablet by mouth daily.     OVER THE COUNTER MEDICATION in the morning and at bedtime. Taking Ferrous Sulfate 325 mg daily.     Polyethyl Glycol-Propyl Glycol (SYSTANE OP) Place 1 drop into both eyes daily.     Probiotic Product (PROBIOTIC DAILY PO) Take 1 tablet by mouth daily.      progesterone (PROMETRIUM) 100 MG capsule TAKE 1 CAPSULE BY MOUTH EVERY DAY 30 capsule 1   psyllium (METAMUCIL) 58.6 % powder Take 1 packet by mouth. prn     No current facility-administered medications on file prior to visit.   Allergies:  Allergies  Allergen Reactions   Lipitor [Atorvastatin] Other (See Comments)    "neck pain"   Metoprolol Other (See Comments)    DIDN'T WORK FOR PATIENT AND GAINED WEIGHT MIGHT HAVE BEEN TAKEN WITH RYTHMOL   Phentermine Other (See Comments)    Did not help, Insomnia    Rythmol [Propafenone] Other (See Comments)    "weight gain"   Sulfa Drugs Cross Reactors Other (See Comments)    "soreness all over"   Lisinopril Cough   Medical History:  She has Hyperlipidemia, mixed; Vitamin D deficiency; Medication management; Hypothyroidism; Abnormal glucose; Morbid obesity ([place]); Leukocytosis; Persistent atrial fibrillation ([place]); Psoriasis of scalp; Essential hypertension; Multinodular  thyroid; and History of malignant melanoma on their problem list. Health Maintenance:   Immunization History  Administered Date(s) Administered   COVID-19, mRNA, vaccine(Comirnaty)12 years and older 01/22/2022   PFIZER Comirnaty(Gray Top)Covid-19 Tri-Sucrose Vaccine 03/05/2021   PFIZER(Purple Top)SARS-COV-2 Vaccination 06/29/2019, 07/20/2019, 03/16/2020, 10/10/2020   PPD Test 12/07/2013, 12/22/2014, 08/17/2016, 11/27/2017, 04/14/2019   Tdap 06/23/2015   Health Maintenance  Topic Date Due   Zoster Vaccines- Shingrix (1 of 2) Never done   PAP SMEAR-Modifier  Never done   INFLUENZA VACCINE  Never done   COVID-19 Vaccine (7 - 2023-24 season) 03/19/2022   Fecal DNA (Cologuard)  06/17/2022   MAMMOGRAM  03/06/2023   DTaP/Tdap/Td (2 - Td or Tdap) 06/22/2025   Hepatitis C Screening  Completed   HIV Screening  Completed   HPV VACCINES  Aged Out    Pap: getting at GYN, next due 01/2022, Monica Morgan Colonoscopy: Cologuard 06/2019 Due   Last Dental Exam: few years, will schedule this year  Last Eye Exam: 04/2021, [facility], no concerns, reading glasses  Last derm: reports follows annually  Mammogram:  02/2022 Due 02/2023  Patient Care Team: Unk Pinto, MD as PCP - General (Internal Medicine) Constance Haw, MD as PCP - Cardiology (Cardiology) Constance Haw, MD as PCP - Electrophysiology (Cardiology)  Surgical History:  She has a past surgical history that includes Melanoma excision (Right, 10/1975); Cesarean section with bilateral tubal ligation (1982); Cesarean section (1980); TEE without cardioversion (N/A, 10/25/2015); Cardioversion (N/A, 10/25/2015); Tonsillectomy (1967); TEE without cardioversion (N/A, 01/11/2016); Cardiac catheterization (N/A, 01/13/2016); A-FLUTTER ABLATION (N/A, 02/08/2017); ATRIAL FIBRILLATION ABLATION (N/A, 04/11/2017); Refractive surgery (Bilateral, 1990s); Tubal ligation; Endometrial ablation (~ 2012); and Hysteroscopy (03/28/2020). Family  History:  Herfamily history includes Diabetes in her father; Heart attack in her brother and mother; Heart disease in her father and mother; Hypertension in her father and mother. Social History:  She reports that she has never smoked. She  has never used smokeless tobacco. She reports current alcohol use. She reports that she does not use drugs.  Review of Systems: Review of Systems  Constitutional:  Negative for malaise/fatigue and weight loss.  HENT:  Negative for hearing loss and tinnitus.        Frequent rhinitis with intermittent hoarseness  Eyes:  Negative for blurred vision and double vision.  Respiratory:  Negative for cough, shortness of breath and wheezing.   Cardiovascular:  Negative for chest pain, palpitations, orthopnea, claudication and leg swelling.  Gastrointestinal:  Negative for abdominal pain, blood in stool, constipation, diarrhea, heartburn, melena, nausea and vomiting.  Genitourinary: Negative.   Musculoskeletal:  Positive for joint pain (intermittent bil knees, hips, improved recent). Negative for myalgias.  Skin:  Positive for rash (chronic, scalp).  Neurological:  Negative for dizziness, tingling, sensory change, weakness and headaches.  Endo/Heme/Allergies:  Positive for environmental allergies. Negative for polydipsia.  Psychiatric/Behavioral: Negative.    All other systems reviewed and are negative.   Physical Exam: Estimated body mass index is 37.95 kg/m as calculated from the following:   Height as of this encounter: 5' 5.25" (1.657 m).   Weight as of this encounter: 229 lb 12.8 oz (104.2 kg). BP (!) 140/70   Pulse 62   Temp (!) 97.3 F (36.3 C)   Ht 5' 5.25" (1.657 m)   Wt 229 lb 12.8 oz (104.2 kg)   LMP 10/29/2013 (Approximate)   SpO2 98%   BMI 37.95 kg/m   General Appearance: Well nourished, in no apparent distress.  Eyes: PERRLA, EOMs, conjunctiva no swelling or erythema Sinuses: No Frontal/maxillary tenderness  ENT/Mouth: Ext aud canals  clear, normal light reflex with TMs without erythema, bulging. Good dentition. No erythema, swelling, or exudate on post pharynx. Tonsils not swollen or erythematous. Hearing normal.  Neck: Supple, thyroid normal. No bruits  Respiratory: Respiratory effort normal, BS equal bilaterally without rales, rhonchi, wheezing or stridor.  Cardio: RRR without murmurs, rubs or gallops. Brisk peripheral pulses without edema.  Chest: symmetric, with normal excursions and percussion.  Breasts: defer to GYN, recent normal mammogram  Abdomen: Soft, obese abdomen, nontender, no guarding, rebound, hernias, masses, or organomegaly.  Lymphatics: Non tender without lymphadenopathy.  Genitourinary: defer to GYN Musculoskeletal: Full ROM all peripheral extremities,5/5 strength, and normal gait.  Skin: Warm, dry without rashes, lesions, ecchymosis. Neuro: Cranial nerves intact, reflexes equal bilaterally. Normal muscle tone, no cerebellar symptoms. Sensation intact.  Psych: Awake and oriented X 3, normal affect, Insight and Judgment appropriate.   EKG: cardiology is obtaining annually, had 07/21/2020 - defer  Monica Morgan 3:50 PM [facility]

## 2022-05-09 LAB — LIPID PANEL
Cholesterol: 206 mg/dL — ABNORMAL HIGH (ref <200)
HDL: 49 mg/dL — ABNORMAL LOW (ref >=50)
LDL Cholesterol (Calc): 116 mg/dL (calc) — ABNORMAL HIGH
Non-HDL Cholesterol (Calc): 157 mg/dL (calc) — ABNORMAL HIGH (ref <130)
Total CHOL/HDL Ratio: 4.2 (calc) (ref <5.0)
Triglycerides: 305 mg/dL — ABNORMAL HIGH (ref <150)

## 2022-05-09 LAB — COMPLETE METABOLIC PANEL WITH GFR
AG Ratio: 1.9 (calc) (ref 1.0–2.5)
ALT: 16 U/L (ref 6–29)
AST: 17 U/L (ref 10–35)
Albumin: 4.9 g/dL (ref 3.6–5.1)
Alkaline phosphatase (APISO): 83 U/L (ref 37–153)
BUN: 20 mg/dL (ref 7–25)
CO2: 27 mmol/L (ref 20–32)
Calcium: 10.2 mg/dL (ref 8.6–10.4)
Chloride: 101 mmol/L (ref 98–110)
Creat: 0.73 mg/dL (ref 0.50–1.05)
Globulin: 2.6 g/dL (calc) (ref 1.9–3.7)
Glucose, Bld: 91 mg/dL (ref 65–99)
Potassium: 4.6 mmol/L (ref 3.5–5.3)
Sodium: 140 mmol/L (ref 135–146)
Total Bilirubin: 0.4 mg/dL (ref 0.2–1.2)
Total Protein: 7.5 g/dL (ref 6.1–8.1)
eGFR: 92 mL/min/{1.73_m2} (ref >=60)

## 2022-05-09 LAB — HEMOGLOBIN A1C
Hgb A1c MFr Bld: 5.8 % of total Hgb — ABNORMAL HIGH (ref <5.7)
Mean Plasma Glucose: 120 mg/dL
eAG (mmol/L): 6.6 mmol/L

## 2022-05-09 LAB — MICROSCOPIC MESSAGE

## 2022-05-09 LAB — URINALYSIS, ROUTINE W REFLEX MICROSCOPIC
Bilirubin Urine: NEGATIVE
Glucose, UA: NEGATIVE
Hgb urine dipstick: NEGATIVE
Hyaline Cast: NONE SEEN /LPF
Ketones, ur: NEGATIVE
Nitrite: NEGATIVE
Protein, ur: NEGATIVE
RBC / HPF: NONE SEEN /HPF (ref 0–2)
Specific Gravity, Urine: 1.014 (ref 1.001–1.035)
pH: 5.5 (ref 5.0–8.0)

## 2022-05-09 LAB — CBC WITH DIFFERENTIAL/PLATELET
Absolute Monocytes: 891 cells/uL (ref 200–950)
Basophils Absolute: 85 cells/uL (ref 0–200)
Basophils Relative: 0.7 %
Eosinophils Absolute: 159 cells/uL (ref 15–500)
Eosinophils Relative: 1.3 %
HCT: 41.9 % (ref 35.0–45.0)
Hemoglobin: 14.2 g/dL (ref 11.7–15.5)
Lymphs Abs: 3953 cells/uL — ABNORMAL HIGH (ref 850–3900)
MCH: 29.3 pg (ref 27.0–33.0)
MCHC: 33.9 g/dL (ref 32.0–36.0)
MCV: 86.4 fL (ref 80.0–100.0)
MPV: 10.3 fL (ref 7.5–12.5)
Monocytes Relative: 7.3 %
Neutro Abs: 7113 cells/uL (ref 1500–7800)
Neutrophils Relative %: 58.3 %
Platelets: 349 10*3/uL (ref 140–400)
RBC: 4.85 10*6/uL (ref 3.80–5.10)
RDW: 12.7 % (ref 11.0–15.0)
Total Lymphocyte: 32.4 %
WBC: 12.2 10*3/uL — ABNORMAL HIGH (ref 3.8–10.8)

## 2022-05-09 LAB — TSH: TSH: 3.03 mIU/L (ref 0.40–4.50)

## 2022-05-09 LAB — MICROALBUMIN / CREATININE URINE RATIO
Creatinine, Urine: 90 mg/dL (ref 20–275)
Microalb Creat Ratio: 8 mcg/mg creat (ref <30)
Microalb, Ur: 0.7 mg/dL

## 2022-05-09 LAB — MAGNESIUM: Magnesium: 2.6 mg/dL — ABNORMAL HIGH (ref 1.5–2.5)

## 2022-05-09 LAB — VITAMIN D 25 HYDROXY (VIT D DEFICIENCY, FRACTURES): Vit D, 25-Hydroxy: 78 ng/mL (ref 30–100)

## 2022-05-09 LAB — INSULIN, RANDOM: Insulin: 22 u[IU]/mL — ABNORMAL HIGH

## 2022-05-14 DIAGNOSIS — Z6838 Body mass index (BMI) 38.0-38.9, adult: Secondary | ICD-10-CM | POA: Diagnosis not present

## 2022-05-14 DIAGNOSIS — Z01419 Encounter for gynecological examination (general) (routine) without abnormal findings: Secondary | ICD-10-CM | POA: Diagnosis not present

## 2022-05-14 DIAGNOSIS — Z124 Encounter for screening for malignant neoplasm of cervix: Secondary | ICD-10-CM | POA: Diagnosis not present

## 2022-05-18 LAB — HM PAP SMEAR: HPV, high-risk: NEGATIVE

## 2022-06-25 DIAGNOSIS — R051 Acute cough: Secondary | ICD-10-CM | POA: Diagnosis not present

## 2022-06-25 DIAGNOSIS — Z6836 Body mass index (BMI) 36.0-36.9, adult: Secondary | ICD-10-CM | POA: Diagnosis not present

## 2022-06-25 DIAGNOSIS — J069 Acute upper respiratory infection, unspecified: Secondary | ICD-10-CM | POA: Diagnosis not present

## 2022-06-28 ENCOUNTER — Encounter: Payer: Self-pay | Admitting: Nurse Practitioner

## 2022-06-28 ENCOUNTER — Ambulatory Visit (INDEPENDENT_AMBULATORY_CARE_PROVIDER_SITE_OTHER): Payer: BC Managed Care – PPO | Admitting: Nurse Practitioner

## 2022-06-28 ENCOUNTER — Other Ambulatory Visit: Payer: Self-pay

## 2022-06-28 VITALS — BP 128/72 | HR 71 | Temp 96.8°F | Ht 65.25 in

## 2022-06-28 DIAGNOSIS — J40 Bronchitis, not specified as acute or chronic: Secondary | ICD-10-CM

## 2022-06-28 DIAGNOSIS — Z1152 Encounter for screening for COVID-19: Secondary | ICD-10-CM | POA: Diagnosis not present

## 2022-06-28 DIAGNOSIS — R051 Acute cough: Secondary | ICD-10-CM

## 2022-06-28 DIAGNOSIS — R6889 Other general symptoms and signs: Secondary | ICD-10-CM | POA: Diagnosis not present

## 2022-06-28 DIAGNOSIS — R062 Wheezing: Secondary | ICD-10-CM

## 2022-06-28 DIAGNOSIS — H6121 Impacted cerumen, right ear: Secondary | ICD-10-CM

## 2022-06-28 DIAGNOSIS — R0600 Dyspnea, unspecified: Secondary | ICD-10-CM

## 2022-06-28 LAB — POCT INFLUENZA A/B
Influenza A, POC: NEGATIVE
Influenza B, POC: NEGATIVE

## 2022-06-28 LAB — POCT RESPIRATORY SYNCYTIAL VIRUS: RSV Rapid Ag: NEGATIVE

## 2022-06-28 LAB — POC COVID19 BINAXNOW: SARS Coronavirus 2 Ag: NEGATIVE

## 2022-06-28 MED ORDER — IPRATROPIUM-ALBUTEROL 0.5-2.5 (3) MG/3ML IN SOLN: 3.0000 mL | Freq: Once | RESPIRATORY_TRACT | Status: DC

## 2022-06-28 MED ORDER — PREDNISONE 10 MG PO TABS: ORAL_TABLET | ORAL | 0 refills | Status: DC

## 2022-06-28 MED ORDER — PROMETHAZINE-DM 6.25-15 MG/5ML PO SYRP: 5.0000 mL | ORAL_SOLUTION | Freq: Four times a day (QID) | ORAL | 0 refills | Status: DC | PRN

## 2022-06-28 NOTE — Patient Instructions (Signed)
  Acute Bronchitis, Adult  Acute bronchitis is when air tubes in the lungs (bronchi) suddenly get swollen. The condition can make it hard for you to breathe. In adults, acute bronchitis usually goes away within 2 weeks. A cough caused by bronchitis may last up to 3 weeks. Smoking, allergies, and asthma can make the condition worse. What are the causes? Germs that cause cold and flu (viruses). The most common cause of this condition is the virus that causes the common cold. Bacteria. Substances that bother (irritate) the lungs, including: Smoke from cigarettes and other types of tobacco. Dust and pollen. Fumes from chemicals, gases, or burned fuel. Indoor or outdoor air pollution. What increases the risk? A weak body's defense system. This is also called the immune system. Any condition that affects your lungs and breathing, such as asthma. What are the signs or symptoms? A cough. Coughing up clear, yellow, or green mucus. Making high-pitched whistling sounds when you breathe, most often when you breathe out (wheezing). Runny or stuffy nose. Having too much mucus in your lungs (chest congestion). Shortness of breath. Body aches. A sore throat. How is this treated? Acute bronchitis may go away over time without treatment. Your doctor may tell you to: Drink more fluids. This will help thin your mucus so it is easier to cough up. Use a device that gets medicine into your lungs (inhaler). Use a vaporizer or a humidifier. These are machines that add water to the air. This helps with coughing and poor breathing. Take a medicine that thins mucus and helps clear it from your lungs. Take a medicine that prevents or stops coughing. It is not common to take an antibiotic medicine for this condition. Follow these instructions at home:  Take over-the-counter and prescription medicines only as told by your doctor. Use an inhaler, vaporizer, or humidifier as told by your doctor. Take two  teaspoons (10 mL) of honey at bedtime. This helps lessen your coughing at night. Drink enough fluid to keep your pee (urine) pale yellow. Do not smoke or use any products that contain nicotine or tobacco. If you need help quitting, ask your doctor. Get a lot of rest. Return to your normal activities when your doctor says that it is safe. Keep all follow-up visits. How is this prevented?  Wash your hands often with soap and water for at least 20 seconds. If you cannot use soap and water, use hand sanitizer. Avoid contact with people who have cold symptoms. Try not to touch your mouth, nose, or eyes with your hands. Avoid breathing in smoke or chemical fumes. Make sure to get the flu shot every year. Contact a doctor if: Your symptoms do not get better in 2 weeks. You have trouble coughing up the mucus. Your cough keeps you awake at night. You have a fever. Get help right away if: You cough up blood. You have chest pain. You have very bad shortness of breath. You faint or keep feeling like you are going to faint. You have a very bad headache. Your fever or chills get worse. These symptoms may be an emergency. Get help right away. Call your local emergency services (911 in the [place] Do not wait to see if the symptoms will go away. Do not drive yourself to the hospital. Summary Acute bronchitis is when air tubes in the lungs (bronchi) suddenly get swollen. In adults, acute bronchitis usually goes away within 2 weeks. Drink more fluids. This will help thin your mucus so it   is easier to cough up. Take over-the-counter and prescription medicines only as told by your doctor. Contact a doctor if your symptoms do not improve after 2 weeks of treatment. This information is not intended to replace advice given to you by your health care provider. Make sure you discuss any questions you have with your health care provider. Document Revised: 08/03/2020 Document Reviewed: 08/03/2020 Elsevier  Patient Education  2023 [employer]  

## 2022-06-28 NOTE — Progress Notes (Signed)
Assessment and Plan:  Monica Morgan was seen today for an episodic visit.  Diagnoses and all order for this visit:  Flu-like symptoms Negative  - POCT Influenza A/B  Encounter for screening for COVID-19 Negative  - POC COVID-19  Dyspnea, unspecified type  - POCT respiratory syncytial virus  Bronchitis/Wheezing/Cough Continue Albuterol inhaler  Continue Benzonatate Duo-neb administered - tolerated well Start Promethazine cough syrup Start Prednisone taper Zyrtec 10 mg samples provided Continue Sigulair Stay well hydrated to keep mucus thin and productive Report to ER or call 911 for any increase in difficult breathing.  Contact office if s/s fail to improve in 48-72 hours.   Right Ear Impacted Cerumen Unsuccessful attempt to remove impacted cerumen Refer to ENT for further review and evaluation.  Orders Placed This Encounter  Procedures   POCT Influenza A/B   POC COVID-19    Order Specific Question:   Previously tested for COVID-19    Answer:   No    Order Specific Question:   Resident in a congregate (group) care setting    Answer:   No    Order Specific Question:   Employed in healthcare setting    Answer:   No    Order Specific Question:   Pregnant    Answer:   No   POCT respiratory syncytial virus   Meds ordered this encounter  Medications   ipratropium-albuterol (DUONEB) 0.5-2.5 (3) MG/3ML nebulizer solution 3 mL   predniSONE (DELTASONE) 10 MG tablet    Sig: 1 tab 3 x day for 2 days, then 1 tab 2 x day for 2 days, then 1 tab 1 x day for 3 days    Dispense:  13 tablet    Refill:  0    Order Specific Question:   Supervising Provider    Answer:   Unk Pinto [phone]   promethazine-dextromethorphan (PROMETHAZINE-DM) 6.25-15 MG/5ML syrup    Sig: Take 5 mLs by mouth 4 (four) times daily as needed for cough.    Dispense:  240 mL    Refill:  0    Order Specific Question:   Supervising Provider    Answer:   Unk Pinto [phone]   Notify office  for further evaluation and treatment, questions or concerns if s/s fail to improve. The risks and benefits of my recommendations, as well as other treatment options were discussed with the patient today. Questions were answered.  Further disposition pending results of labs. Discussed med's effects and SE's.    Over 20 minutes of exam, counseling, chart review, and critical decision making was performed.   Future Appointments  Date Time Provider [place]  10/30/2022  3:30 PM Darrol Jump, NP [facility] None  05/09/2023  3:00 PM Melva Faux, Kenney Houseman, NP [facility] None    ------------------------------------------------------------------------------------------------------------------   HPI BP 128/72   Pulse 71   Temp (!) 96.8 F (36 C)   Ht 5' 5.25" (1.657 m)   LMP 10/29/2013 (Approximate)   SpO2 98%   BMI 37.95 kg/m    Patient complains of symptoms of a URI. Symptoms include right ear pressure/pain, wheezing, cough, nasal and chest congestion, HA. Onset of symptoms was 1 week ago, and has been unchanged since that time. She was seen in UC on 06/25/22 and provided Albuterol and Benzonatate.  Has been taking without much effectiveness.  Denies fever, chills, N/V.  She has never been a smoker.  Past Medical History:  Diagnosis Date   Arthritis    "probably in my knees; maybe in  my hands" (04/11/2017)   Asthma attack 10/2015 X 1   Atrial flutter ([place]) 10/2015   Atrial flutter with rapid ventricular response ([place]) 10/20/2015   Heart murmur    "comes and goes" (04/11/2017)   Hepatitis 1972   "don't know what kind"   High cholesterol    Hypothyroidism    Malignant melanoma ([place]) 10/1975   R leg as a teen   Malignant melanoma of leg ([place])    "right thigh"   PAF (paroxysmal atrial fibrillation) ([place])    Pneumonia 1966; 1967   "left lung collapsed one of these times"   Psoriasis    Rosacea    Vitamin D deficiency      Allergies  Allergen Reactions   Lipitor  [Atorvastatin] Other (See Comments)    "neck pain"   Metoprolol Other (See Comments)    DIDN'T WORK FOR PATIENT AND GAINED WEIGHT MIGHT HAVE BEEN TAKEN WITH RYTHMOL   Phentermine Other (See Comments)    Did not help, Insomnia    Rythmol [Propafenone] Other (See Comments)    "weight gain"   Sulfa Drugs Cross Reactors Other (See Comments)    "soreness all over"   Lisinopril Cough    Current Outpatient Medications on File Prior to Visit  Medication Sig   apixaban (ELIQUIS) 5 MG TABS tablet TAKE 1 TABLET BY MOUTH TWICE A DAY   Ascorbic Acid (VITAMIN C) 500 MG CHEW Chew by mouth. 2 times per day    augmented betamethasone dipropionate (DIPROLENE-AF) 0.05 % cream APPLY VERY SPARINGLY TO PSORIASIS RASH (DO NOT USE ON FACE ! )   Black Pepper-Turmeric 3-500 MG CAPS Take by mouth.   Calcium 600-400 MG-UNIT CHEW Chew 1 each by mouth 2 (two) times daily.    Cholecalciferol (VITAMIN D PO) Take 10,000 Units by mouth daily.   Coenzyme Q10 (CO Q10) 100 MG CAPS Take 100 mg by mouth daily.    cromolyn (NASALCROM) 5.2 MG/ACT nasal spray Place 1 spray into both nostrils in the morning and at bedtime.   cyclobenzaprine (FLEXERIL) 5 MG tablet TAKE 1 TABLET BY MOUTH 3 TIMES DAILY AS NEEDED FOR MUSCLE SPASM   diltiazem (CARDIZEM CD) 120 MG 24 hr capsule TAKE 1 CAPSULE BY MOUTH EVERY DAY   DIVIGEL 0.5 MG/0.5GM GEL SMARTSIG:1 Packet(s) T-DERMAL Daily   ezetimibe (ZETIA) 10 MG tablet TAKE 1 TABLET DAILY FOR CHOLESTREROL   ferrous sulfate 324 MG TBEC Take 324 mg by mouth.   flecainide (TAMBOCOR) 100 MG tablet TAKE 1 TABLET 2 X /DAY (EVERY 12 HOURS) FOR AFIB   fluocinolone (SYNALAR) 0.01 % external solution See admin instructions.   furosemide (LASIX) 20 MG tablet TAKE 1 TABLET DAILY AS NEEDED FOR FLUID RETENTION   levothyroxine (SYNTHROID) 50 MCG tablet TAKE 1 TABLET DAILY ON EMPTY STOMACH WITH ONLY WATER FOR 30 MIN (NO ANTACID MEDS, CALCIUM OR MAGNESIUM FOR 4 HOURS & AVOID BIOTIN)   Magnesium 500 MG TABS Take  500 mg by mouth every evening.    Melatonin 1 MG CHEW Chew by mouth. Prn   metoprolol tartrate (LOPRESSOR) 100 MG tablet TAKE ONE TABLET BY MOUTH 2 HOURS PRIOR TO SCAN   montelukast (SINGULAIR) 10 MG tablet TAKE 1 TABLET BY MOUTH DAILY FOR ALLERGIES   Multiple Vitamin (MULTIVITAMIN WITH MINERALS) TABS tablet Take 1 tablet by mouth daily.   OVER THE COUNTER MEDICATION in the morning and at bedtime. Taking Ferrous Sulfate 325 mg daily.   Polyethyl Glycol-Propyl Glycol (SYSTANE OP) Place 1 drop into both  eyes daily.   Probiotic Product (PROBIOTIC DAILY PO) Take 1 tablet by mouth daily.    progesterone (PROMETRIUM) 100 MG capsule TAKE 1 CAPSULE BY MOUTH EVERY DAY   psyllium (METAMUCIL) 58.6 % powder Take 1 packet by mouth. prn   No current facility-administered medications on file prior to visit.    ROS: all negative except what is noted in the HPI.   Physical Exam:  BP 128/72   Pulse 71   Temp (!) 96.8 F (36 C)   Ht 5' 5.25" (1.657 m)   LMP 10/29/2013 (Approximate)   SpO2 98%   BMI 37.95 kg/m   General Appearance: NAD.  Awake, conversant and cooperative. Eyes: PERRLA, EOMs intact.  Sclera white.  Conjunctiva without erythema. Sinuses: Frontal/maxillary tenderness.  No nasal discharge. Nares not patent.  ENT/Mouth: Right external ear canal with impacted cerumet.  UTA TM.  Left  TM w/DOL and without erythema or bulging. Hearing intact.  Posterior pharynx without swelling or exudate.  Tonsils without swelling or erythema.  Neck: Supple.  No masses, nodules or thyromegaly. Respiratory: Effort is regular with non-labored breathing. Breath sounds are equal bilaterally with scattered wheezing throughout upper posterior and anterior lung fields upon expiration.  Cardio: RRR with no MRGs. Brisk peripheral pulses without edema.  Abdomen: Active BS in all four quadrants.  Soft and non-tender without guarding, rebound tenderness, hernias or masses. Lymphatics: Non tender without  lymphadenopathy.  Musculoskeletal: Full ROM, 5/5 strength, normal ambulation.  No clubbing or cyanosis. Skin: Appropriate color for ethnicity. Warm without rashes, lesions, ecchymosis, ulcers.  Neuro: CN II-XII grossly normal. Normal muscle tone without cerebellar symptoms and intact sensation.   Psych: AO X 3,  appropriate mood and affect, insight and judgment.     Darrol Jump, NP 10:44 AM [facility]

## 2022-07-02 ENCOUNTER — Encounter: Payer: Self-pay | Admitting: Nurse Practitioner

## 2022-07-02 DIAGNOSIS — R062 Wheezing: Secondary | ICD-10-CM

## 2022-07-02 DIAGNOSIS — R051 Acute cough: Secondary | ICD-10-CM

## 2022-07-02 DIAGNOSIS — R0600 Dyspnea, unspecified: Secondary | ICD-10-CM

## 2022-07-02 MED ORDER — AZITHROMYCIN 500 MG PO TABS: 500.0000 mg | ORAL_TABLET | Freq: Every day | ORAL | 0 refills | Status: AC

## 2022-07-07 ENCOUNTER — Other Ambulatory Visit: Payer: Self-pay | Admitting: Cardiology

## 2022-07-07 DIAGNOSIS — I4819 Other persistent atrial fibrillation: Secondary | ICD-10-CM

## 2022-07-09 ENCOUNTER — Other Ambulatory Visit: Payer: Self-pay | Admitting: Nurse Practitioner

## 2022-07-09 NOTE — Telephone Encounter (Signed)
Prescription refill request for Eliquis received. Indication: Afib  Last office visit: 12/06/21 Charlcie Cradle)  Scr: 0.73 (05/08/22)  Age: 62 Weight: 104.2kg  Appropriate dose. Refill sent.

## 2022-07-17 DIAGNOSIS — M25551 Pain in right hip: Secondary | ICD-10-CM | POA: Diagnosis not present

## 2022-08-02 DIAGNOSIS — M65341 Trigger finger, right ring finger: Secondary | ICD-10-CM | POA: Diagnosis not present

## 2022-08-10 DIAGNOSIS — H938X1 Other specified disorders of right ear: Secondary | ICD-10-CM | POA: Diagnosis not present

## 2022-08-10 DIAGNOSIS — H6121 Impacted cerumen, right ear: Secondary | ICD-10-CM | POA: Diagnosis not present

## 2022-08-31 DIAGNOSIS — Z1211 Encounter for screening for malignant neoplasm of colon: Secondary | ICD-10-CM | POA: Diagnosis not present

## 2022-09-07 LAB — COLOGUARD: COLOGUARD: NEGATIVE

## 2022-09-26 DIAGNOSIS — M9903 Segmental and somatic dysfunction of lumbar region: Secondary | ICD-10-CM | POA: Diagnosis not present

## 2022-09-26 DIAGNOSIS — M9901 Segmental and somatic dysfunction of cervical region: Secondary | ICD-10-CM | POA: Diagnosis not present

## 2022-09-26 DIAGNOSIS — M62830 Muscle spasm of back: Secondary | ICD-10-CM | POA: Diagnosis not present

## 2022-09-26 DIAGNOSIS — M5382 Other specified dorsopathies, cervical region: Secondary | ICD-10-CM | POA: Diagnosis not present

## 2022-09-26 DIAGNOSIS — M9902 Segmental and somatic dysfunction of thoracic region: Secondary | ICD-10-CM | POA: Diagnosis not present

## 2022-10-03 NOTE — Progress Notes (Signed)
Cardiology Office Note:  .   Date:  10/04/2022  ID:  ADELAYDE Morgan, DOB 05-Nov-1960, MRN [id number] PCP: Monica Cowboy, MD  [facility] Cardiologist:  Will Jorja Loa, MD Electrophysiologist:  Regan Lemming, MD     History of Present Illness: .   Monica Morgan is a 62 y.o. female with PMH of melanoma, hypothyroidism, psoriasis/rosacea, HLD, PAF, AFlutter, A-Tach.    She was last seen in EP Clinic in 11/2021 - more SOB during that visit.Coronary CTA was repeated & ECHO updated with murmur on exam - results below. She subsequently had surgery for R hip ORIF.     Returns 6/20 for routing OV and reports she just had a birthday and is feeling well. She notes her blood pressure has been running higher than normal.  Typically takes her lasix 1x/week. Occasional LE swelling when she drinks wine. She is asking about starting [facility]. Denies episodes of recurrent flutter/fib or palpitations.  Denies chest pain, syncope, dizziness/lightheadedness.    ROS: Negative unless mentioned in HPI.   Studies Reviewed: .    Studies 12/2021 TTE > LVEF 55-60%, no RWMA, RV systolic function normal 12/2021 Coronary CT with score > coronary Ca+ score 47 / 77 percentile for age, normal coronary origin with right dominance, moderate distal RCA stenosis 50% (non-flow limiting)  Arrhythmia / Device  2000 PAF, A-Flutter diagnosis 12/2015 PVI ablation  02/08/2017 CTI ablation 04/11/2017 PVI / repeat ablation with extensive left atrial ablation for multiple atypical atrial flutters, ablating along the left atrial roof, right superior pulmonary vein to mitral valve, and left inferior pulmonary vein to mitral valve, CTI ablation.   AAD / Anticoagulation  2018 Flecainide, Cardizem, Eliquis       HYPERTENSION CONTROL Vitals:   10/04/22 0814 10/04/22 0841  BP: (!) 170/94 (!) 160/90    The patient's blood pressure is elevated above target today.  In order to address the patient's  elevated BP: Blood pressure will be monitored at home to determine if medication changes need to be made.; Follow up with primary care provider for management.          Physical Exam:   VS:  BP (!) 160/90   Pulse 60   Ht 5\' 5"  (1.651 m)   Wt 231 lb 3.2 oz (104.9 kg)   LMP 10/29/2013 (Approximate)   SpO2 94%   BMI 38.47 kg/m    Wt Readings from Last 3 Encounters:  10/04/22 231 lb 3.2 oz (104.9 kg)  05/08/22 229 lb 12.8 oz (104.2 kg)  12/22/21 234 lb (106.1 kg)    GEN: Well nourished, well developed in no acute distress NECK: No JVD; No carotid bruits CARDIAC: s1s2 RRR, 2/6 SEM, no rubs, gallops RESPIRATORY:  Clear to auscultation without rales, wheezing or rhonchi  ABDOMEN: Soft, non-tender, non-distended EXTREMITIES:  No edema; No deformity   ASSESSMENT AND PLAN: .    Persistent Atrial Fibrillation S/p ablation 2018, CH2DS2-VASc at least 2 -remains in SR on flecainide  -EKG with normal intervals   -continue anticoagulation  Typical Atrial Flutter  S/p CTI ablation  -no further episodes   Secondary Hypercoagulable State -continue Eliquis for AF, dosing reviewed & appropriate   HTN  -BP elevated on visit  -last BP readings in clinic have been good > SBP 120's-140, asked pt to keep a log of BP and take to PCP, defer changes to anti-hypertensive regimen to PCP  -intolerant of lopressor and can not take ACE-I -diet education, salt reduction  discussed  -defer Wegovy to PCP   OSA  Not on CPAP -consider sleep evaluation, defer to PCP        Dispo: Follow up with PCP as scheduled in July and in 6 months with Dr. Elberta Fortis  Signed, Monica Brim, MSN, APRN, NP-C, AGACNP-BC [facility] - Electrophysiology  10/04/2022, 9:02 AM

## 2022-10-04 ENCOUNTER — Encounter: Payer: Self-pay | Admitting: Pulmonary Disease

## 2022-10-04 ENCOUNTER — Ambulatory Visit: Payer: BC Managed Care – PPO | Attending: Physician Assistant | Admitting: Pulmonary Disease

## 2022-10-04 VITALS — BP 160/90 | HR 60 | Ht 65.0 in | Wt 231.2 lb

## 2022-10-04 DIAGNOSIS — I10 Essential (primary) hypertension: Secondary | ICD-10-CM

## 2022-10-04 DIAGNOSIS — R931 Abnormal findings on diagnostic imaging of heart and coronary circulation: Secondary | ICD-10-CM | POA: Diagnosis not present

## 2022-10-04 DIAGNOSIS — I4819 Other persistent atrial fibrillation: Secondary | ICD-10-CM

## 2022-10-04 NOTE — Patient Instructions (Signed)
Medication Instructions:   Your physician recommends that you continue on your current medications as directed. Please refer to the Current Medication list given to you today.   *If you need a refill on your cardiac medications before your next appointment, please call your pharmacy*   Lab Work: NONE ORDERED  TODAY   If you have labs (blood work) drawn today and your tests are completely normal, you will receive your results only by: MyChart Message (if you have MyChart) OR A paper copy in the mail If you have any lab test that is abnormal or we need to change your treatment, we will call you to review the results.   Testing/Procedures: NONE ORDERED  TODAY    Follow-Up: At [facility], you and your health needs are our priority.  As part of our continuing mission to provide you with exceptional heart care, we have created designated Provider Care Teams.  These Care Teams include your primary Cardiologist (physician) and Advanced Practice Providers (APPs -  Physician Assistants and Nurse Practitioners) who all work together to provide you with the care you need, when you need it.  We recommend signing up for the patient portal called "MyChart".  Sign up information is provided on this After Visit Summary.  MyChart is used to connect with patients for Virtual Visits (Telemedicine).  Patients are able to view lab/test results, encounter notes, upcoming appointments, etc.  Non-urgent messages can be sent to your provider as well.   To learn more about what you can do with MyChart, go to [email or web address].    Your next appointment:   6 month(s)  Provider:   Will Camnitz, MD    Other Instructions  

## 2022-10-11 ENCOUNTER — Other Ambulatory Visit: Payer: Self-pay

## 2022-10-11 ENCOUNTER — Other Ambulatory Visit: Payer: Self-pay | Admitting: Cardiology

## 2022-10-11 MED ORDER — FUROSEMIDE 20 MG PO TABS: ORAL_TABLET | ORAL | 1 refills | Status: AC

## 2022-10-30 ENCOUNTER — Encounter: Payer: Self-pay | Admitting: Nurse Practitioner

## 2022-10-30 ENCOUNTER — Ambulatory Visit: Payer: 59 | Admitting: Nurse Practitioner

## 2022-10-30 VITALS — BP 146/72 | HR 64 | Temp 97.5°F | Ht 65.0 in | Wt 233.6 lb

## 2022-10-30 DIAGNOSIS — E042 Nontoxic multinodular goiter: Secondary | ICD-10-CM

## 2022-10-30 DIAGNOSIS — I10 Essential (primary) hypertension: Secondary | ICD-10-CM

## 2022-10-30 DIAGNOSIS — I4819 Other persistent atrial fibrillation: Secondary | ICD-10-CM | POA: Diagnosis not present

## 2022-10-30 DIAGNOSIS — E782 Mixed hyperlipidemia: Secondary | ICD-10-CM

## 2022-10-30 DIAGNOSIS — R7309 Other abnormal glucose: Secondary | ICD-10-CM

## 2022-10-30 DIAGNOSIS — Z79899 Other long term (current) drug therapy: Secondary | ICD-10-CM

## 2022-10-30 DIAGNOSIS — E559 Vitamin D deficiency, unspecified: Secondary | ICD-10-CM

## 2022-10-30 DIAGNOSIS — E039 Hypothyroidism, unspecified: Secondary | ICD-10-CM | POA: Diagnosis not present

## 2022-10-30 DIAGNOSIS — L409 Psoriasis, unspecified: Secondary | ICD-10-CM

## 2022-10-30 LAB — CBC WITH DIFFERENTIAL/PLATELET: Hemoglobin: 14.3 g/dL (ref 11.7–15.5)

## 2022-10-30 NOTE — Patient Instructions (Signed)

## 2022-10-30 NOTE — Progress Notes (Signed)
Follow Up  Assessment and Plan:  Tavon was seen today for a general follow up  Diagnoses and all orders for this visit:  Essential hypertension Discussed DASH (Dietary Approaches to Stop Hypertension) DASH diet is lower in sodium than a typical American diet. Cut back on foods that are high in saturated fat, cholesterol, and trans fats. Eat more whole-grain foods, fish, poultry, and nuts Remain active and exercise as tolerated daily.  Monitor BP at home-Call if greater than 130/80.  Check CMP/CBC  Persistent atrial fibrillation (HCC) Rate controlled; cardiology following; Continue elequis   Hypothyroidism, unspecified type Controlled. Continue Levothyroxine. Reminded to take on an empty stomach 30-84mins before food.  Stop any Biotin Supplement 48-72 hours before next TSH level to reduce the risk of falsely low TSH levels. Continue to monitor.     Multinodular thyroid Normal biopsy in 02/2020; no further follow up was recommended  Psoriasis of scalp Treat PRN; avoid triggers; has seen rheum  Morbid obesity (HCC) - BMI 37 with htn, hld Discussed appropriate BMI Diet modification. Physical activity. Encouraged/praised to build confidence.  Hyperlipidemia, mixed Discussed lifestyle modifications. Recommended diet heavy in fruits and veggies, omega 3's. Decrease consumption of animal meats, cheeses, and dairy products. Remain active and exercise as tolerated. Continue to monitor. Check lipids/TSH   Vitamin D deficiency Continue supplement; check annually  Monitor levels   Medication management All medications discussed and reviewed in full. All questions and concerns regarding medications addressed.    Abnormal glucose Education: Reviewed 'ABCs' of diabetes management  Discussed goals to be met and/or maintained include A1C (<7) Blood pressure (<130/80) Cholesterol (LDL <70) Continue Eye Exam yearly  Continue Dental Exam Q6 mo Discussed dietary  recommendations Discussed Physical Activity recommendations Check A1C   Orders Placed This Encounter  Procedures   CBC with Differential/Platelet   COMPLETE METABOLIC PANEL WITH GFR   Lipid panel   TSH   Hemoglobin A1C w/out eAG   VITAMIN D 25 Hydroxy (Vit-D Deficiency, Fractures)   Notify office for further evaluation and treatment, questions or concerns if any reported s/s fail to improve.   The patient was advised to call back or seek an in-person evaluation if any symptoms worsen or if the condition fails to improve as anticipated.   Further disposition pending results of labs. Discussed med's effects and SE's.    I discussed the assessment and treatment plan with the patient. The patient was provided an opportunity to ask questions and all were answered. The patient agreed with the plan and demonstrated an understanding of the instructions.  Discussed med's effects and SE's. Screening labs and tests as requested with regular follow-up as recommended.  I provided 25 minutes of face-to-face time during this encounter including counseling, chart review, and critical decision making was preformed.   Future Appointments  Date Time Provider Department Center  04/03/2023  9:45 AM Regan Lemming, MD [facility]  05/09/2023  3:00 PM Adela Glimpse, NP [facility] None     HPI  62 y.o. female  presents for a complete physical and follow up for has Hyperlipidemia, mixed; Vitamin D deficiency; Medication management; Hypothyroidism; Abnormal glucose; Morbid obesity (HCC); Leukocytosis; Persistent atrial fibrillation (HCC); Psoriasis of scalp; Essential hypertension; Multinodular thyroid; and History of malignant melanoma on their problem list.  Overall she reports feeling well.   She had a right hip replacement with [facility], Dr. Turner Daniels.  She has completed PT and is now with a walking group from work.    Has had  increase in right right ring finger locking  and pain that radiates down to palm area.  Types daily for work.  Had steroid injection and area now resolved.   Follows with [facility] Dr. Conni Elliot, on Progesterone.  Has f/u next month 05/2022. Gets mammogram annually at the breast center.   Follows with chiropractor for mid back strain, knee and hip pain, improving with adjustments, doing a tens unit. Likes to walk in pool when weather permits.   She has hx of psoriasis of scalp, has seen rheum, doing topicals PRN only.   BMI is Body mass index is 38.87 kg/m., she has been working on diet, trying to eat the right things, watching portions. Poor candidate phentermine due to heart, wellbutrin caused very dry mouth without significant benefit. Unable to take GLP-1 d/t hx of thyroid nodules.  Wt Readings from Last 3 Encounters:  10/30/22 233 lb 9.6 oz (106 kg)  10/04/22 231 lb 3.2 oz (104.9 kg)  05/08/22 229 lb 12.8 oz (104.2 kg)   She has a. Fib and hx of flutter with RVR, had ablations in 2017/2018 by Dr Elberta Fortis. She remains on Eliquis for CHA2DsVASc 2. Taking diltiazem 120 mg daily and flecainide. Continues to follow annually. Denies symptoms   Her blood pressure has been controlled at home, today their BP is BP: (!) 146/72  She does not workout, but reports fairly active around the home. She denies chest pain, shortness of breath, dizziness.   She is not on cholesterol medication and denies myalgias. Her cholesterol is not at goal, working on lifestyle. The cholesterol last visit was:   Lab Results  Component Value Date   CHOL 206 (H) 05/08/2022   HDL 49 (L) 05/08/2022   LDLCALC 116 (H) 05/08/2022   TRIG 305 (H) 05/08/2022   CHOLHDL 4.2 05/08/2022   She has been working on diet and exercise for glucose management and denies nausea, paresthesia of the feet, polydipsia, polyuria, visual disturbances and vomiting. Last A1C in the office was:  Lab Results  Component Value Date   HGBA1C 5.8 (H) 05/08/2022    Last GFR: Lab Results   Component Value Date   GFRNONAA 64 10/06/2020   She has hx of multinodular thyroid goiter, recently left inferior nodule showed growth and underwent FNA on 03/08/2020 which resulted bethesda II. No other nodules were felt concerning and no further follow up was recommended by radiology.   She is on thyroid medication for hypothyroid. Her medication was not changed last visit.  50 mcg daily.  Lab Results  Component Value Date   TSH 3.03 05/08/2022   Patient is on Vitamin D supplement.   Lab Results  Component Value Date   VD25OH 78 05/08/2022     She has had a mildly elevated white blood cell count since at least October 2017 ranging from 10.8-14.6. She was referred to hematology  for further evaluation.  Peripheral blood flow cytometry and BCR-ABL mutation are negative and recommended no further work up or follow up by Dr. Orlie Dakin.     Latest Ref Rng & Units 05/08/2022    4:38 PM 10/11/2021    4:47 PM 05/04/2021    3:48 PM  CBC  WBC 3.8 - 10.8 Thousand/uL 12.2  13.1  11.2   Hemoglobin 11.7 - 15.5 g/dL 56.2  13.0  86.5   Hematocrit 35.0 - 45.0 % 41.9  42.5  41.8   Platelets 140 - 400 Thousand/uL 349  321  336    Chronic recurrent  iron def anemia, on iron slow release 3 times/day with less fatigue. Had negative workup by hematology and was recommended oral supplement. Had negative cologuard in 06/17/2019.  Lab Results  Component Value Date   IRON 65 05/04/2021   TIBC 335 05/04/2021   FERRITIN 171 05/04/2021   Lab Results  Component Value Date   VITAMINB12 1,134 (H) 01/24/2021     Current Medications:  Current Outpatient Medications on File Prior to Visit  Medication Sig Dispense Refill   Ascorbic Acid (VITAMIN C) 500 MG CHEW Chew by mouth. 2 times per day      augmented betamethasone dipropionate (DIPROLENE-AF) 0.05 % cream APPLY VERY SPARINGLY TO PSORIASIS RASH (DO NOT USE ON FACE ! ) 50 g 5   Black Pepper-Turmeric 3-500 MG CAPS Take by mouth.     Calcium 600-400 MG-UNIT  CHEW Chew 1 each by mouth 2 (two) times daily.      Cholecalciferol (VITAMIN D PO) Take 10,000 Units by mouth daily.     Coenzyme Q10 (CO Q10) 100 MG CAPS Take 100 mg by mouth daily.      cromolyn (NASALCROM) 5.2 MG/ACT nasal spray Place 1 spray into both nostrils in the morning and at bedtime.     cyclobenzaprine (FLEXERIL) 5 MG tablet TAKE 1 TABLET BY MOUTH 3 TIMES DAILY AS NEEDED FOR MUSCLE SPASM 270 tablet 0   diltiazem (CARDIZEM CD) 120 MG 24 hr capsule TAKE 1 CAPSULE BY MOUTH EVERY DAY 90 capsule 3   DIVIGEL 0.5 MG/0.5GM GEL SMARTSIG:1 Packet(s) T-DERMAL Daily     ELIQUIS 5 MG TABS tablet TAKE 1 TABLET BY MOUTH TWICE A DAY 180 tablet 1   ezetimibe (ZETIA) 10 MG tablet TAKE 1 TABLET DAILY FOR CHOLESTREROL 90 tablet 3   ferrous sulfate 324 MG TBEC Take 324 mg by mouth.     flecainide (TAMBOCOR) 100 MG tablet TAKE 1 TABLET 2 X /DAY (EVERY 12 HOURS) FOR AFIB 180 tablet 3   furosemide (LASIX) 20 MG tablet TAKE 1 TABLET DAILY AS NEEDED FOR FLUID RETENTION 90 tablet 1   levothyroxine (SYNTHROID) 50 MCG tablet TAKE 1 TABLET DAILY ON EMPTY STOMACH WITH ONLY WATER FOR 30 MIN (NO ANTACID MEDS, CALCIUM OR MAGNESIUM FOR 4 HOURS & AVOID BIOTIN) 270 tablet 1   Magnesium 500 MG TABS Take 500 mg by mouth every evening.      Melatonin 1 MG CHEW Chew by mouth. Prn     montelukast (SINGULAIR) 10 MG tablet TAKE 1 TABLET BY MOUTH DAILY FOR ALLERGIES 90 tablet 3   Multiple Vitamin (MULTIVITAMIN WITH MINERALS) TABS tablet Take 1 tablet by mouth daily.     OVER THE COUNTER MEDICATION in the morning and at bedtime. Taking Ferrous Sulfate 325 mg daily.     Polyethyl Glycol-Propyl Glycol (SYSTANE OP) Place 1 drop into both eyes daily.     Probiotic Product (PROBIOTIC DAILY PO) Take 1 tablet by mouth daily.      progesterone (PROMETRIUM) 100 MG capsule TAKE 1 CAPSULE BY MOUTH EVERY DAY 30 capsule 1   psyllium (METAMUCIL) 58.6 % powder Take 1 packet by mouth. prn     fluocinolone (SYNALAR) 0.01 % external solution See  admin instructions. (Patient not taking: Reported on 10/30/2022)     Current Facility-Administered Medications on File Prior to Visit  Medication Dose Route Frequency Provider Last Rate Last Admin   ipratropium-albuterol (DUONEB) 0.5-2.5 (3) MG/3ML nebulizer solution 3 mL  3 mL Nebulization Once Adela Glimpse, NP  Allergies:  Allergies  Allergen Reactions   Lipitor [Atorvastatin] Other (See Comments)    "neck pain"   Metoprolol Other (See Comments)    DIDN'T WORK FOR PATIENT AND GAINED WEIGHT MIGHT HAVE BEEN TAKEN WITH RYTHMOL   Phentermine Other (See Comments)    Did not help, Insomnia    Rythmol [Propafenone] Other (See Comments)    "weight gain"   Sulfa Drugs Cross Reactors Other (See Comments)    "soreness all over"   Lisinopril Cough   Medical History:  She has Hyperlipidemia, mixed; Vitamin D deficiency; Medication management; Hypothyroidism; Abnormal glucose; Morbid obesity (HCC); Leukocytosis; Persistent atrial fibrillation (HCC); Psoriasis of scalp; Essential hypertension; Multinodular thyroid; and History of malignant melanoma on their problem list. Health Maintenance:   Immunization History  Administered Date(s) Administered   COVID-19, mRNA, vaccine(Comirnaty)12 years and older 01/22/2022   PFIZER Comirnaty(Gray Top)Covid-19 Tri-Sucrose Vaccine 03/05/2021   PFIZER(Purple Top)SARS-COV-2 Vaccination 06/29/2019, 07/20/2019, 03/16/2020, 10/10/2020   PPD Test 12/07/2013, 12/22/2014, 08/17/2016, 11/27/2017, 04/14/2019   Tdap 06/23/2015   Health Maintenance  Topic Date Due   Zoster Vaccines- Shingrix (1 of 2) Never done   PAP SMEAR-Modifier  Never done   COVID-19 Vaccine (7 - 2023-24 season) 03/19/2022   INFLUENZA VACCINE  11/15/2022   MAMMOGRAM  03/06/2023   DTaP/Tdap/Td (2 - Td or Tdap) 06/22/2025   Fecal DNA (Cologuard)  08/30/2025   Hepatitis C Screening  Completed   HIV Screening  Completed   HPV VACCINES  Aged Out    Patient Care Team: Lucky Cowboy, MD as PCP - General (Internal Medicine) Regan Lemming, MD as PCP - Cardiology (Cardiology) Regan Lemming, MD as PCP - Electrophysiology (Cardiology)  Surgical History:  She has a past surgical history that includes Melanoma excision (Right, 10/1975); Cesarean section with bilateral tubal ligation (1982); Cesarean section (1980); TEE without cardioversion (N/A, 10/25/2015); Cardioversion (N/A, 10/25/2015); Tonsillectomy (1967); TEE without cardioversion (N/A, 01/11/2016); Cardiac catheterization (N/A, 01/13/2016); A-FLUTTER ABLATION (N/A, 02/08/2017); ATRIAL FIBRILLATION ABLATION (N/A, 04/11/2017); Refractive surgery (Bilateral, 1990s); Tubal ligation; Endometrial ablation (~ 2012); and Hysteroscopy (03/28/2020). Family History:  Herfamily history includes Diabetes in her father; Heart attack in her brother and mother; Heart disease in her father and mother; Hypertension in her father and mother. Social History:  She reports that she has never smoked. She has never used smokeless tobacco. She reports current alcohol use. She reports that she does not use drugs.  Review of Systems: Review of Systems  Constitutional:  Negative for malaise/fatigue and weight loss.  HENT:  Negative for hearing loss and tinnitus.        Frequent rhinitis with intermittent hoarseness  Eyes:  Negative for blurred vision and double vision.  Respiratory:  Negative for cough, shortness of breath and wheezing.   Cardiovascular:  Negative for chest pain, palpitations, orthopnea, claudication and leg swelling.  Gastrointestinal:  Negative for abdominal pain, blood in stool, constipation, diarrhea, heartburn, melena, nausea and vomiting.  Genitourinary: Negative.   Musculoskeletal:  Positive for joint pain (intermittent bil knees, hips, improved recent). Negative for myalgias.  Skin:  Positive for rash (chronic, scalp).  Neurological:  Negative for dizziness, tingling, sensory change, weakness and  headaches.  Endo/Heme/Allergies:  Positive for environmental allergies. Negative for polydipsia.  Psychiatric/Behavioral: Negative.    All other systems reviewed and are negative.   Physical Exam: Estimated body mass index is 38.87 kg/m as calculated from the following:   Height as of this encounter: 5\' 5"  (1.651 m).   Weight as  of this encounter: 233 lb 9.6 oz (106 kg). BP (!) 146/72   Pulse 64   Temp (!) 97.5 F (36.4 C)   Ht 5\' 5"  (1.651 m)   Wt 233 lb 9.6 oz (106 kg)   LMP 10/29/2013 (Approximate)   SpO2 98%   BMI 38.87 kg/m   General Appearance: Well nourished, in no apparent distress.  Eyes: PERRLA, EOMs, conjunctiva no swelling or erythema Sinuses: No Frontal/maxillary tenderness  ENT/Mouth: Ext aud canals clear, normal light reflex with TMs without erythema, bulging. Good dentition. No erythema, swelling, or exudate on post pharynx. Tonsils not swollen or erythematous. Hearing normal.  Neck: Supple, thyroid normal. No bruits  Respiratory: Respiratory effort normal, BS equal bilaterally without rales, rhonchi, wheezing or stridor.  Cardio: RRR without murmurs, rubs or gallops. Brisk peripheral pulses without edema.  Chest: symmetric, with normal excursions and percussion.  Breasts: defer to GYN, recent normal mammogram  Abdomen: Soft, obese abdomen, nontender, no guarding, rebound, hernias, masses, or organomegaly.  Lymphatics: Non tender without lymphadenopathy.  Genitourinary: defer to GYN Musculoskeletal: Full ROM all peripheral extremities,5/5 strength, and normal gait.  Skin: Warm, dry without rashes, lesions, ecchymosis. Neuro: Cranial nerves intact, reflexes equal bilaterally. Normal muscle tone, no cerebellar symptoms. Sensation intact.  Psych: Awake and oriented X 3, normal affect, Insight and Judgment appropriate.    Adela Glimpse, NP 4:10 PM [facility]

## 2022-10-31 LAB — CBC WITH DIFFERENTIAL/PLATELET
Absolute Monocytes: 913 cells/uL (ref 200–950)
Basophils Absolute: 75 cells/uL (ref 0–200)
Basophils Relative: 0.6 %
Eosinophils Absolute: 125 cells/uL (ref 15–500)
Eosinophils Relative: 1 %
HCT: 42.8 % (ref 35.0–45.0)
Lymphs Abs: 3925 cells/uL — ABNORMAL HIGH (ref 850–3900)
MCH: 29.6 pg (ref 27.0–33.0)
MCHC: 33.4 g/dL (ref 32.0–36.0)
MCV: 88.6 fL (ref 80.0–100.0)
MPV: 10.1 fL (ref 7.5–12.5)
Monocytes Relative: 7.3 %
Neutro Abs: 7463 cells/uL (ref 1500–7800)
Neutrophils Relative %: 59.7 %
Platelets: 319 10*3/uL (ref 140–400)
RBC: 4.83 10*6/uL (ref 3.80–5.10)
RDW: 12.6 % (ref 11.0–15.0)
Total Lymphocyte: 31.4 %
WBC: 12.5 10*3/uL — ABNORMAL HIGH (ref 3.8–10.8)

## 2022-10-31 LAB — COMPLETE METABOLIC PANEL WITH GFR
AG Ratio: 1.9 (calc) (ref 1.0–2.5)
ALT: 18 U/L (ref 6–29)
AST: 16 U/L (ref 10–35)
Albumin: 4.9 g/dL (ref 3.6–5.1)
Alkaline phosphatase (APISO): 77 U/L (ref 37–153)
BUN: 23 mg/dL (ref 7–25)
CO2: 28 mmol/L (ref 20–32)
Calcium: 10.2 mg/dL (ref 8.6–10.4)
Chloride: 101 mmol/L (ref 98–110)
Creat: 0.86 mg/dL (ref 0.50–1.05)
Globulin: 2.6 g/dL (calc) (ref 1.9–3.7)
Glucose, Bld: 90 mg/dL (ref 65–99)
Potassium: 4.4 mmol/L (ref 3.5–5.3)
Sodium: 139 mmol/L (ref 135–146)
Total Bilirubin: 0.5 mg/dL (ref 0.2–1.2)
Total Protein: 7.5 g/dL (ref 6.1–8.1)
eGFR: 75 mL/min/{1.73_m2} (ref >=60)

## 2022-10-31 LAB — LIPID PANEL
Cholesterol: 199 mg/dL (ref <200)
HDL: 49 mg/dL — ABNORMAL LOW (ref >=50)
LDL Cholesterol (Calc): 110 mg/dL (calc) — ABNORMAL HIGH
Non-HDL Cholesterol (Calc): 150 mg/dL (calc) — ABNORMAL HIGH (ref <130)
Total CHOL/HDL Ratio: 4.1 (calc) (ref <5.0)
Triglycerides: 302 mg/dL — ABNORMAL HIGH (ref <150)

## 2022-10-31 LAB — VITAMIN D 25 HYDROXY (VIT D DEFICIENCY, FRACTURES): Vit D, 25-Hydroxy: 75 ng/mL (ref 30–100)

## 2022-10-31 LAB — HEMOGLOBIN A1C W/OUT EAG: Hgb A1c MFr Bld: 5.8 % of total Hgb — ABNORMAL HIGH (ref <5.7)

## 2022-10-31 LAB — TSH: TSH: 1.88 mIU/L (ref 0.40–4.50)

## 2022-12-16 ENCOUNTER — Other Ambulatory Visit: Payer: Self-pay | Admitting: Internal Medicine

## 2023-01-23 ENCOUNTER — Other Ambulatory Visit: Payer: Self-pay | Admitting: Internal Medicine

## 2023-01-23 DIAGNOSIS — Z1231 Encounter for screening mammogram for malignant neoplasm of breast: Secondary | ICD-10-CM

## 2023-02-17 ENCOUNTER — Other Ambulatory Visit: Payer: Self-pay | Admitting: Cardiology

## 2023-02-17 DIAGNOSIS — I4819 Other persistent atrial fibrillation: Secondary | ICD-10-CM

## 2023-02-18 NOTE — Telephone Encounter (Signed)
Prescription refill request for Eliquis received. Indication:afib Last office visit:6/24 Scr:0.86  7/24 Age: 62 Weight:106  kg  Prescription refilled

## 2023-03-07 ENCOUNTER — Ambulatory Visit
Admission: RE | Admit: 2023-03-07 | Discharge: 2023-03-07 | Disposition: A | Discharge status: Home / Self Care | Payer: No Typology Code available for payment source | Source: Ambulatory Visit | Attending: Internal Medicine | Admitting: Internal Medicine

## 2023-03-07 DIAGNOSIS — Z1231 Encounter for screening mammogram for malignant neoplasm of breast: Secondary | ICD-10-CM

## 2023-03-08 ENCOUNTER — Ambulatory Visit: Payer: BC Managed Care – PPO

## 2023-03-21 ENCOUNTER — Other Ambulatory Visit: Payer: Self-pay | Admitting: Internal Medicine

## 2023-03-21 ENCOUNTER — Telehealth: Payer: Self-pay | Admitting: Nurse Practitioner

## 2023-03-21 MED ORDER — FLECAINIDE ACETATE 100 MG PO TABS: ORAL_TABLET | ORAL | 3 refills | Status: DC

## 2023-03-21 NOTE — Addendum Note (Signed)
Addended by: Dionicio Stall on: 03/21/2023 04:19 PM   Modules accepted: Orders

## 2023-03-21 NOTE — Telephone Encounter (Signed)
Please send refill of Flecainide to [id number] Monica Morgan,  - [address]

## 2023-04-03 ENCOUNTER — Ambulatory Visit: Payer: No Typology Code available for payment source | Attending: Cardiology | Admitting: Cardiology

## 2023-04-03 ENCOUNTER — Encounter: Payer: Self-pay | Admitting: Cardiology

## 2023-04-03 VITALS — BP 138/76 | HR 68 | Ht 65.0 in | Wt 234.0 lb

## 2023-04-03 DIAGNOSIS — I483 Typical atrial flutter: Secondary | ICD-10-CM | POA: Diagnosis not present

## 2023-04-03 DIAGNOSIS — I10 Essential (primary) hypertension: Secondary | ICD-10-CM

## 2023-04-03 DIAGNOSIS — D6869 Other thrombophilia: Secondary | ICD-10-CM

## 2023-04-03 DIAGNOSIS — I4819 Other persistent atrial fibrillation: Secondary | ICD-10-CM | POA: Diagnosis not present

## 2023-04-03 NOTE — Patient Instructions (Signed)
 Medication Instructions:  Your physician recommends that you continue on your current medications as directed. Please refer to the Current Medication list given to you today.  *If you need a refill on your cardiac medications before your next appointment, please call your pharmacy*   Lab Work: None ordered   Testing/Procedures: None ordered   Follow-Up: At [facility], you and your health needs are our priority.  As part of our continuing mission to provide you with exceptional heart care, we have created designated Provider Care Teams.  These Care Teams include your primary Cardiologist (physician) and Advanced Practice Providers (APPs -  Physician Assistants and Nurse Practitioners) who all work together to provide you with the care you need, when you need it.  Your next appointment:   6 month(s)  The format for your next appointment:   In Person  Provider:   You will see one of the following Advanced Practice Providers on your designated Care Team:   Francis Dowse, [place] "Mardelle Matte" [place], [place] Canary Brim, NP   Thank you for choosing [facility]!!   Dory Horn, RN [phone]

## 2023-04-03 NOTE — Progress Notes (Signed)
  Electrophysiology Office Note:   Date:  04/03/2023  ID:  Monica Morgan, DOB 1960-07-28, MRN [id number]  Primary Cardiologist: Saima Monterroso Monica Loa, MD Primary Heart Failure: None Electrophysiologist: Cammy Sanjurjo Monica Loa, MD      History of Present Illness:   Monica Morgan is a 62 y.o. female with h/o fibrillation/flutter, hypertension, sleep apnea seen today for routine electrophysiology followup.   Since last being seen in our clinic the patient reports doing overall well.  She has no acute complaints.  She is tired, but feels that it is from being busy around the holidays.  She otherwise has no acute complaints.  She does have an issue with obesity, and is potentially interested in weight.  She understands that Zepbound may be an option for her.  She is also interested in [facility].  she denies chest pain, palpitations, dyspnea, PND, orthopnea, nausea, vomiting, dizziness, syncope, edema, weight gain, or early satiety.   Review of systems complete and found to be negative unless listed in HPI.   EP Information / Studies Reviewed:    EKG is ordered today. Personal review as below.  EKG Interpretation Date/Time:  Wednesday April 03 2023 09:01:35 EST Ventricular Rate:  68 PR Interval:  194 QRS Duration:  122 QT Interval:  464 QTC Calculation: 493 R Axis:   -32  Text Interpretation: Sinus rhythm Premature ventricular complexes Left axis deviation Left ventricular hypertrophy with QRS widening ( R in aVL , Cornell product ) Nonspecific ST and T wave abnormality When compared with ECG of 04-Oct-2022 08:11, Premature ventricular complexes Confirmed by Ogden Handlin ([id number]) on 04/03/2023 9:15:39 AM      Risk Assessment/Calculations:    CHA2DS2-VASc Score = 2   This indicates a 2.2% annual risk of stroke. The patient's score is based upon: CHF History: 0 HTN History: 1 Diabetes History: 0 Stroke History: 0 Vascular Disease History: 0 Age Score: 0 Gender  Score: 1              Physical Exam:   VS:  BP 138/76   Pulse 68   Ht 5\' 5"  (1.651 m)   Wt 234 lb (106.1 kg)   LMP 10/29/2013 (Approximate)   SpO2 99%   BMI 38.94 kg/m    Wt Readings from Last 3 Encounters:  04/03/23 234 lb (106.1 kg)  10/30/22 233 lb 9.6 oz (106 kg)  10/04/22 231 lb 3.2 oz (104.9 kg)     GEN: Well nourished, well developed in no acute distress NECK: No JVD; No carotid bruits CARDIAC: Regular rate and rhythm with occasional ectopy, no murmurs, rubs, gallops RESPIRATORY:  Clear to auscultation without rales, wheezing or rhonchi  ABDOMEN: Soft, non-tender, non-distended EXTREMITIES:  No edema; No deformity   ASSESSMENT AND PLAN:    1.  Persistent atrial fibrillation: Post ablation.  Now on flecainide.  Feeling well.  Continue current management.  2.  Typical atrial flutter: Post CTI ablation.  No further episodes.  3.  Secondary hypercoagulable state: Currently on Eliquis for atrial fibrillation  4.  Hypertension: Currently well-controlled  5.  Obstructive sleep apnea: Not on CPAP.  Kamron Vanwyhe discuss further with her PCP.  6.  PVCs: Noted on EKG today.  If PVCs are noted at her next visit, may need to wear a monitor to assess burden  7.  Obesity: Potential referral to [facility] loss [facility]  Follow up with EP APP in 6 months  Signed, Curby Carswell Monica Loa, MD

## 2023-04-16 ENCOUNTER — Encounter: Payer: Self-pay | Admitting: Internal Medicine

## 2023-05-09 ENCOUNTER — Encounter: Payer: 59 | Admitting: Nurse Practitioner

## 2023-05-13 ENCOUNTER — Telehealth: Payer: No Typology Code available for payment source | Admitting: Emergency Medicine

## 2023-05-13 DIAGNOSIS — J111 Influenza due to unidentified influenza virus with other respiratory manifestations: Secondary | ICD-10-CM | POA: Diagnosis not present

## 2023-05-13 MED ORDER — OSELTAMIVIR PHOSPHATE 75 MG PO CAPS: 75.0000 mg | ORAL_CAPSULE | Freq: Two times a day (BID) | ORAL | 0 refills | Status: DC

## 2023-05-13 NOTE — Progress Notes (Signed)
Virtual Visit Consent   Monica Morgan, you are scheduled for a virtual visit with a [facility] provider today. Just as with appointments in the office, your consent must be obtained to participate. Your consent will be active for this visit and any virtual visit you may have with one of our providers in the next 365 days. If you have a MyChart account, a copy of this consent can be sent to you electronically.  As this is a virtual visit, video technology does not allow for your provider to perform a traditional examination. This may limit your provider's ability to fully assess your condition. If your provider identifies any concerns that need to be evaluated in person or the need to arrange testing (such as labs, EKG, etc.), we will make arrangements to do so. Although advances in technology are sophisticated, we cannot ensure that it will always work on either your end or our end. If the connection with a video visit is poor, the visit may have to be switched to a telephone visit. With either a video or telephone visit, we are not always able to ensure that we have a secure connection.  By engaging in this virtual visit, you consent to the provision of healthcare and authorize for your insurance to be billed (if applicable) for the services provided during this visit. Depending on your insurance coverage, you may receive a charge related to this service.  I need to obtain your verbal consent now. Are you willing to proceed with your visit today? [name] has provided verbal consent on 05/13/2023 for a virtual visit (video or telephone). [name], NP  Date: 05/13/2023 9:27 AM  Virtual Visit via Video Note   I, [name], connected with  [name]  ([id number], 1960-07-24) on 05/13/23 at  9:15 AM EST by a video-enabled telemedicine application and verified that I am speaking with the correct person using two identifiers.  Location: Patient: Virtual Visit Location  Patient: Home Provider: Virtual Visit Location Provider: Home Office   I discussed the limitations of evaluation and management by telemedicine and the availability of in person appointments. The patient expressed understanding and agreed to proceed.    History of Present Illness: Monica Morgan is a 63 y.o. who identifies as a female who was assigned female at birth, and is being seen today for flu like symptoms. Felt fine all day 05/10/23, then that evening felt sick with cough, nasal congestion, headache/sinus pressure, and fatigue. Spent all day 1/25 and 1/26 resting. Has not tested self for covid. Did get covid vaccine in November - does not get flu shots. Been taking dayquil and nyquil for sx with little relief. Denies body aches or fever or chills.   HPI: HPI  Problems:  Patient Active Problem List   Diagnosis Date Noted   History of malignant melanoma 05/04/2020   Multinodular thyroid 01/14/2019   Essential hypertension 06/04/2018   Psoriasis of scalp 12/04/2017   Persistent atrial fibrillation (HCC)    Leukocytosis 10/22/2015   Morbid obesity (HCC) 12/25/2014   Abnormal glucose 06/09/2014   Medication management 06/04/2013   Hypothyroidism 06/04/2013   Vitamin D deficiency    Hyperlipidemia, mixed 12/20/2011    Allergies:  Allergies  Allergen Reactions   Lipitor [Atorvastatin] Other (See Comments)    "neck pain"   Metoprolol Other (See Comments)    DIDN'T WORK FOR PATIENT AND GAINED WEIGHT MIGHT HAVE BEEN TAKEN WITH RYTHMOL   Phentermine Other (See  Comments)    Did not help, Insomnia    Rythmol [Propafenone] Other (See Comments)    "weight gain"   Sulfa Drugs Cross Reactors Other (See Comments)    "soreness all over"   Lisinopril Cough   Medications:  Current Outpatient Medications:    oseltamivir (TAMIFLU) 75 MG capsule, Take 1 capsule (75 mg total) by mouth 2 (two) times daily., Disp: 10 capsule, Rfl: 0   Ascorbic Acid (VITAMIN C) 500 MG CHEW, Chew by mouth.  2 times per day , Disp: , Rfl:    augmented betamethasone dipropionate (DIPROLENE-AF) 0.05 % cream, APPLY VERY SPARINGLY TO PSORIASIS RASH (DO NOT USE ON FACE ! ), Disp: 50 g, Rfl: 5   Black Pepper-Turmeric 3-500 MG CAPS, Take by mouth., Disp: , Rfl:    Calcium 600-400 MG-UNIT CHEW, Chew 1 each by mouth 2 (two) times daily. , Disp: , Rfl:    Cholecalciferol (VITAMIN D PO), Take 10,000 Units by mouth daily., Disp: , Rfl:    Coenzyme Q10 (CO Q10) 100 MG CAPS, Take 100 mg by mouth daily. , Disp: , Rfl:    cyclobenzaprine (FLEXERIL) 5 MG tablet, TAKE 1 TABLET BY MOUTH 3 TIMES DAILY AS NEEDED FOR MUSCLE SPASM, Disp: 270 tablet, Rfl: 0   diltiazem (CARDIZEM CD) 120 MG 24 hr capsule, TAKE 1 CAPSULE BY MOUTH EVERY DAY, Disp: 90 capsule, Rfl: 3   DIVIGEL 0.5 MG/0.5GM GEL, SMARTSIG:1 Packet(s) T-DERMAL Daily, Disp: , Rfl:    ELIQUIS 5 MG TABS tablet, TAKE 1 TABLET BY MOUTH TWICE A DAY, Disp: 180 tablet, Rfl: 1   ezetimibe (ZETIA) 10 MG tablet, Take  1 tablet  Daily for Cholesterol                                                           /                                                                   TAKE                                         BY                                                 MOUTH, Disp: 90 tablet, Rfl: 3   ferrous sulfate 324 MG TBEC, Take 324 mg by mouth., Disp: , Rfl:    flecainide (TAMBOCOR) 100 MG tablet, TAKE 1 TABLET BY MOUTH EVERY 12 HOURS FOR AFIB, Disp: 180 tablet, Rfl: 3   furosemide (LASIX) 20 MG tablet, TAKE 1 TABLET DAILY AS NEEDED FOR FLUID RETENTION, Disp: 90 tablet, Rfl: 1   levothyroxine (SYNTHROID) 50 MCG tablet, TAKE 1 TABLET DAILY ON EMPTY STOMACH WITH ONLY WATER FOR 30 MIN (NO ANTACID MEDS, CALCIUM OR MAGNESIUM FOR 4 HOURS & AVOID BIOTIN), Disp:  270 tablet, Rfl: 1   Magnesium 500 MG TABS, Take 500 mg by mouth every evening. , Disp: , Rfl:    Melatonin 1 MG CHEW, Chew by mouth. Prn, Disp: , Rfl:    montelukast (SINGULAIR) 10 MG tablet, TAKE 1 TABLET BY MOUTH DAILY  FOR ALLERGIES, Disp: 90 tablet, Rfl: 3   Multiple Vitamin (MULTIVITAMIN WITH MINERALS) TABS tablet, Take 1 tablet by mouth daily., Disp: , Rfl:    Polyethyl Glycol-Propyl Glycol (SYSTANE OP), Place 1 drop into both eyes daily., Disp: , Rfl:    Probiotic Product (PROBIOTIC DAILY PO), Take 1 tablet by mouth daily. , Disp: , Rfl:    progesterone (PROMETRIUM) 100 MG capsule, TAKE 1 CAPSULE BY MOUTH EVERY DAY, Disp: 30 capsule, Rfl: 1   psyllium (METAMUCIL) 58.6 % powder, Take 1 packet by mouth. prn, Disp: , Rfl:   Current Facility-Administered Medications:    ipratropium-albuterol (DUONEB) 0.5-2.5 (3) MG/3ML nebulizer solution 3 mL, 3 mL, Nebulization, Once, Cranford, Tonya, NP  Observations/Objective: Patient is well-developed, well-nourished in no acute distress.  Resting comfortably  at home.  Head is normocephalic, atraumatic.  No labored breathing.  Speech is clear and coherent with logical content.  Patient is alert and oriented at baseline.  Does sound congested on video  Assessment and Plan: 1. Influenza-like illness (Primary)  Is over 48 hours out from onset of symptoms. They do not seem exactly consistent with influenza but could be from flu. Covid, flu, and influenza-like illnesses circulating, pt could not have flu. Discussed tamiflu or not - pt is more than 48 hours out from sx but is 62 and does have chronic heart disease. She elected to take tamiflu even though she might not have flu and is past the ideal window for starting it.   Follow Up Instructions: I discussed the assessment and treatment plan with the patient. The patient was provided an opportunity to ask questions and all were answered. The patient agreed with the plan and demonstrated an understanding of the instructions.  A copy of instructions were sent to the patient via MyChart unless otherwise noted below.   The patient was advised to call back or seek an in-person evaluation if the symptoms worsen or if the  condition fails to improve as anticipated.    [name], NP

## 2023-05-13 NOTE — Patient Instructions (Signed)
Monica Morgan, thank you for joining [name], NP for today's virtual visit.  While this provider is not your primary care provider (PCP), if your PCP is located in our provider database this encounter information will be shared with them immediately following your visit.   A [facility] MyChart account gives you access to today's visit and all your visits, tests, and labs performed at [facility] " click here if you don't have a [facility] MyChart account or go to [email or web address]  Consent: (Patient) Monica Morgan provided verbal consent for this virtual visit at the beginning of the encounter.  Current Medications:  Current Outpatient Medications:    oseltamivir (TAMIFLU) 75 MG capsule, Take 1 capsule (75 mg total) by mouth 2 (two) times daily., Disp: 10 capsule, Rfl: 0   Ascorbic Acid (VITAMIN C) 500 MG CHEW, Chew by mouth. 2 times per day , Disp: , Rfl:    augmented betamethasone dipropionate (DIPROLENE-AF) 0.05 % cream, APPLY VERY SPARINGLY TO PSORIASIS RASH (DO NOT USE ON FACE ! ), Disp: 50 g, Rfl: 5   Black Pepper-Turmeric 3-500 MG CAPS, Take by mouth., Disp: , Rfl:    Calcium 600-400 MG-UNIT CHEW, Chew 1 each by mouth 2 (two) times daily. , Disp: , Rfl:    Cholecalciferol (VITAMIN D PO), Take 10,000 Units by mouth daily., Disp: , Rfl:    Coenzyme Q10 (CO Q10) 100 MG CAPS, Take 100 mg by mouth daily. , Disp: , Rfl:    cyclobenzaprine (FLEXERIL) 5 MG tablet, TAKE 1 TABLET BY MOUTH 3 TIMES DAILY AS NEEDED FOR MUSCLE SPASM, Disp: 270 tablet, Rfl: 0   diltiazem (CARDIZEM CD) 120 MG 24 hr capsule, TAKE 1 CAPSULE BY MOUTH EVERY DAY, Disp: 90 capsule, Rfl: 3   DIVIGEL 0.5 MG/0.5GM GEL, SMARTSIG:1 Packet(s) T-DERMAL Daily, Disp: , Rfl:    ELIQUIS 5 MG TABS tablet, TAKE 1 TABLET BY MOUTH TWICE A DAY, Disp: 180 tablet, Rfl: 1   ezetimibe (ZETIA) 10 MG tablet, Take  1 tablet  Daily for Cholesterol                                                           /                                                                    TAKE                                         BY                                                 MOUTH, Disp: 90 tablet, Rfl: 3   ferrous sulfate 324 MG TBEC, Take 324 mg by mouth., Disp: , Rfl:    flecainide (TAMBOCOR) 100 MG tablet, TAKE 1 TABLET  BY MOUTH EVERY 12 HOURS FOR AFIB, Disp: 180 tablet, Rfl: 3   furosemide (LASIX) 20 MG tablet, TAKE 1 TABLET DAILY AS NEEDED FOR FLUID RETENTION, Disp: 90 tablet, Rfl: 1   levothyroxine (SYNTHROID) 50 MCG tablet, TAKE 1 TABLET DAILY ON EMPTY STOMACH WITH ONLY WATER FOR 30 MIN (NO ANTACID MEDS, CALCIUM OR MAGNESIUM FOR 4 HOURS & AVOID BIOTIN), Disp: 270 tablet, Rfl: 1   Magnesium 500 MG TABS, Take 500 mg by mouth every evening. , Disp: , Rfl:    Melatonin 1 MG CHEW, Chew by mouth. Prn, Disp: , Rfl:    montelukast (SINGULAIR) 10 MG tablet, TAKE 1 TABLET BY MOUTH DAILY FOR ALLERGIES, Disp: 90 tablet, Rfl: 3   Multiple Vitamin (MULTIVITAMIN WITH MINERALS) TABS tablet, Take 1 tablet by mouth daily., Disp: , Rfl:    Polyethyl Glycol-Propyl Glycol (SYSTANE OP), Place 1 drop into both eyes daily., Disp: , Rfl:    Probiotic Product (PROBIOTIC DAILY PO), Take 1 tablet by mouth daily. , Disp: , Rfl:    progesterone (PROMETRIUM) 100 MG capsule, TAKE 1 CAPSULE BY MOUTH EVERY DAY, Disp: 30 capsule, Rfl: 1   psyllium (METAMUCIL) 58.6 % powder, Take 1 packet by mouth. prn, Disp: , Rfl:   Current Facility-Administered Medications:    ipratropium-albuterol (DUONEB) 0.5-2.5 (3) MG/3ML nebulizer solution 3 mL, 3 mL, Nebulization, Once, Cranford, Tonya, NP   Medications ordered in this encounter:  Meds ordered this encounter  Medications   oseltamivir (TAMIFLU) 75 MG capsule    Sig: Take 1 capsule (75 mg total) by mouth 2 (two) times daily.    Dispense:  10 capsule    Refill:  0     *If you need refills on other medications prior to your next appointment, please contact your pharmacy*  Follow-Up: Call back  or seek an in-person evaluation if the symptoms worsen or if the condition fails to improve as anticipated.  [facility] [phone]  Other Instructions  Continue dayquil and nyquil if they are helping manage your symptoms. Add Mucinex and saline nasal spray. Rest, drink lots of liquids. If you are feeling worse, especially if you are feeling short of breath, please get checked in person.    If you have been instructed to have an in-person evaluation today at a local Urgent Care facility, please use the link below. It will take you to a list of all of our available [facility], including address, phone number and hours of operation. Please do not delay care.  [facility]  If you or a family member do not have a primary care provider, use the link below to schedule a visit and establish care. When you choose a [facility] primary care physician or advanced practice provider, you gain a long-term partner in health. Find a Primary Care Provider  Learn more about [facility] in-office and virtual care options: [facility] - Get Care Now

## 2023-05-22 ENCOUNTER — Telehealth: Payer: Self-pay | Admitting: Nurse Practitioner

## 2023-05-22 NOTE — Telephone Encounter (Signed)
 REFILL ON CYCLOBENZAPRINE  TO [facility] IN [place]

## 2023-05-29 ENCOUNTER — Encounter: Payer: Self-pay | Admitting: Nurse Practitioner

## 2023-06-06 ENCOUNTER — Ambulatory Visit: Payer: No Typology Code available for payment source | Admitting: Dietician

## 2023-06-07 ENCOUNTER — Telehealth: Payer: No Typology Code available for payment source | Admitting: Family Medicine

## 2023-06-07 DIAGNOSIS — J111 Influenza due to unidentified influenza virus with other respiratory manifestations: Secondary | ICD-10-CM

## 2023-06-07 MED ORDER — PROMETHAZINE-DM 6.25-15 MG/5ML PO SYRP: 5.0000 mL | ORAL_SOLUTION | Freq: Four times a day (QID) | ORAL | 0 refills | Status: AC | PRN

## 2023-06-07 MED ORDER — OSELTAMIVIR PHOSPHATE 75 MG PO CAPS
75.0000 mg | ORAL_CAPSULE | Freq: Two times a day (BID) | ORAL | 0 refills | Status: AC
Start: 2023-06-07 — End: 2023-06-12

## 2023-06-07 NOTE — Patient Instructions (Signed)

## 2023-06-07 NOTE — Progress Notes (Signed)
Virtual Visit Consent   Monica Morgan, you are scheduled for a virtual visit with a [facility] provider today. Just as with appointments in the office, your consent must be obtained to participate. Your consent will be active for this visit and any virtual visit you may have with one of our providers in the next 365 days. If you have a MyChart account, a copy of this consent can be sent to you electronically.  As this is a virtual visit, video technology does not allow for your provider to perform a traditional examination. This may limit your provider's ability to fully assess your condition. If your provider identifies any concerns that need to be evaluated in person or the need to arrange testing (such as labs, EKG, etc.), we will make arrangements to do so. Although advances in technology are sophisticated, we cannot ensure that it will always work on either your end or our end. If the connection with a video visit is poor, the visit may have to be switched to a telephone visit. With either a video or telephone visit, we are not always able to ensure that we have a secure connection.  By engaging in this virtual visit, you consent to the provision of healthcare and authorize for your insurance to be billed (if applicable) for the services provided during this visit. Depending on your insurance coverage, you may receive a charge related to this service.  I need to obtain your verbal consent now. Are you willing to proceed with your visit today? Monica Morgan has provided verbal consent on 06/07/2023 for a virtual visit (video or telephone). Georgana Curio, FNP  Date: 06/07/2023 1:35 PM   Virtual Visit via Video Note   I, Georgana Curio, connected with  Monica Morgan  ([id number], 1961-01-03) on 06/07/23 at  1:30 PM EST by a video-enabled telemedicine application and verified that I am speaking with the correct person using two identifiers.  Location: Patient: Virtual Visit Location Patient:  Home Provider: Virtual Visit Location Provider: Home Office   I discussed the limitations of evaluation and management by telemedicine and the availability of in person appointments. The patient expressed understanding and agreed to proceed.    History of Present Illness: Monica Morgan is a 63 y.o. who identifies as a female who was assigned female at birth, and is being seen today for fever chills, body aches, headache, and cough for 1 day. Marland Kitchen  HPI: HPI  Problems:  Patient Active Problem List   Diagnosis Date Noted   History of malignant melanoma 05/04/2020   Multinodular thyroid 01/14/2019   Essential hypertension 06/04/2018   Psoriasis of scalp 12/04/2017   Persistent atrial fibrillation (HCC)    Leukocytosis 10/22/2015   Morbid obesity (HCC) 12/25/2014   Abnormal glucose 06/09/2014   Medication management 06/04/2013   Hypothyroidism 06/04/2013   Vitamin D deficiency    Hyperlipidemia, mixed 12/20/2011    Allergies:  Allergies  Allergen Reactions   Lipitor [Atorvastatin] Other (See Comments)    "neck pain"   Metoprolol Other (See Comments)    DIDN'T WORK FOR PATIENT AND GAINED WEIGHT MIGHT HAVE BEEN TAKEN WITH RYTHMOL   Phentermine Other (See Comments)    Did not help, Insomnia    Rythmol [Propafenone] Other (See Comments)    "weight gain"   Sulfa Drugs Cross Reactors Other (See Comments)    "soreness all over"   Lisinopril Cough   Medications:  Current Outpatient Medications:    Ascorbic Acid (VITAMIN C)  500 MG CHEW, Chew by mouth. 2 times per day , Disp: , Rfl:    augmented betamethasone dipropionate (DIPROLENE-AF) 0.05 % cream, APPLY VERY SPARINGLY TO PSORIASIS RASH (DO NOT USE ON FACE ! ), Disp: 50 g, Rfl: 5   Black Pepper-Turmeric 3-500 MG CAPS, Take by mouth., Disp: , Rfl:    Calcium 600-400 MG-UNIT CHEW, Chew 1 each by mouth 2 (two) times daily. , Disp: , Rfl:    Cholecalciferol (VITAMIN D PO), Take 10,000 Units by mouth daily., Disp: , Rfl:    Coenzyme  Q10 (CO Q10) 100 MG CAPS, Take 100 mg by mouth daily. , Disp: , Rfl:    cyclobenzaprine (FLEXERIL) 5 MG tablet, TAKE 1 TABLET BY MOUTH 3 TIMES DAILY AS NEEDED FOR MUSCLE SPASM, Disp: 270 tablet, Rfl: 0   diltiazem (CARDIZEM CD) 120 MG 24 hr capsule, TAKE 1 CAPSULE BY MOUTH EVERY DAY, Disp: 90 capsule, Rfl: 3   DIVIGEL 0.5 MG/0.5GM GEL, SMARTSIG:1 Packet(s) T-DERMAL Daily, Disp: , Rfl:    ELIQUIS 5 MG TABS tablet, TAKE 1 TABLET BY MOUTH TWICE A DAY, Disp: 180 tablet, Rfl: 1   ezetimibe (ZETIA) 10 MG tablet, Take  1 tablet  Daily for Cholesterol                                                           /                                                                   TAKE                                         BY                                                 MOUTH, Disp: 90 tablet, Rfl: 3   ferrous sulfate 324 MG TBEC, Take 324 mg by mouth., Disp: , Rfl:    flecainide (TAMBOCOR) 100 MG tablet, TAKE 1 TABLET BY MOUTH EVERY 12 HOURS FOR AFIB, Disp: 180 tablet, Rfl: 3   furosemide (LASIX) 20 MG tablet, TAKE 1 TABLET DAILY AS NEEDED FOR FLUID RETENTION, Disp: 90 tablet, Rfl: 1   levothyroxine (SYNTHROID) 50 MCG tablet, TAKE 1 TABLET DAILY ON EMPTY STOMACH WITH ONLY WATER FOR 30 MIN (NO ANTACID MEDS, CALCIUM OR MAGNESIUM FOR 4 HOURS & AVOID BIOTIN), Disp: 270 tablet, Rfl: 1   Magnesium 500 MG TABS, Take 500 mg by mouth every evening. , Disp: , Rfl:    Melatonin 1 MG CHEW, Chew by mouth. Prn, Disp: , Rfl:    montelukast (SINGULAIR) 10 MG tablet, TAKE 1 TABLET BY MOUTH DAILY FOR ALLERGIES, Disp: 90 tablet, Rfl: 3   Multiple Vitamin (MULTIVITAMIN WITH MINERALS) TABS tablet, Take 1 tablet by mouth daily., Disp: , Rfl:    oseltamivir (  TAMIFLU) 75 MG capsule, Take 1 capsule (75 mg total) by mouth 2 (two) times daily., Disp: 10 capsule, Rfl: 0   Polyethyl Glycol-Propyl Glycol (SYSTANE OP), Place 1 drop into both eyes daily., Disp: , Rfl:    Probiotic Product (PROBIOTIC DAILY PO), Take 1 tablet by mouth daily.  , Disp: , Rfl:    progesterone (PROMETRIUM) 100 MG capsule, TAKE 1 CAPSULE BY MOUTH EVERY DAY, Disp: 30 capsule, Rfl: 1   psyllium (METAMUCIL) 58.6 % powder, Take 1 packet by mouth. prn, Disp: , Rfl:   Current Facility-Administered Medications:    ipratropium-albuterol (DUONEB) 0.5-2.5 (3) MG/3ML nebulizer solution 3 mL, 3 mL, Nebulization, Once, Cranford, Tonya, NP  Observations/Objective: Patient is well-developed, well-nourished in no acute distress.  Resting comfortably  at home.  Head is normocephalic, atraumatic.  No labored breathing.  Speech is clear and coherent with logical content.  Patient is alert and oriented at baseline.    Assessment and Plan: 1. Influenza-like illness (Primary)  Increase fluids, humidifier at night, tylenol or ibuprofen as directed, UC as needed.   Follow Up Instructions: I discussed the assessment and treatment plan with the patient. The patient was provided an opportunity to ask questions and all were answered. The patient agreed with the plan and demonstrated an understanding of the instructions.  A copy of instructions were sent to the patient via MyChart unless otherwise noted below.     The patient was advised to call back or seek an in-person evaluation if the symptoms worsen or if the condition fails to improve as anticipated.    Georgana Curio, FNP

## 2023-07-11 ENCOUNTER — Encounter: Payer: Self-pay | Admitting: Dietician

## 2023-07-11 ENCOUNTER — Encounter: Payer: No Typology Code available for payment source | Attending: Cardiology | Admitting: Dietician

## 2023-07-11 DIAGNOSIS — Z713 Dietary counseling and surveillance: Secondary | ICD-10-CM | POA: Insufficient documentation

## 2023-07-11 DIAGNOSIS — Z6838 Body mass index (BMI) 38.0-38.9, adult: Secondary | ICD-10-CM | POA: Insufficient documentation

## 2023-07-11 NOTE — Progress Notes (Signed)
 Medical Nutrition Therapy  Appointment Start time:  [phone]  Appointment End time:  0930  Primary concerns today: Get healthier/Weight loss  Referral diagnosis: E66.01 - Morbid obesity Preferred learning style: No preference indicated Learning readiness: Not ready   NUTRITION ASSESSMENT   Anthropometrics Ht: 65" Wt: 228.7 lbs  BMI: 38.06 kg/m2 Goal Wt: Below 200 lbs   Clinical Medical Hx: HTN, HLD, Abnormal Glucose, Hypothyroidism, Obesity, Vit D deficiency Medications: Ezetimibe, Eliquis, Diltiazem, Levothyroxine, Furosemide (PRN) Labs: (10/30/2022) - A1c - 5.8%, TGL - 302, HDL - 49, LDL - 110 Notable Signs/Symptoms: N/A   Lifestyle & Dietary Hx Pt reports goal of weight loss and getting healthier for their 5 grandkids, states they are hoping to decrease medications as well. Pt reports one of their medications can cause dizziness (when walking down stairs), states their physical activity is limited by this, walking a treadmill and in the pool is ok. Pt reports previous history being an athlete as a teenager, was taking regular exercise classes years ago, states they used to have a good group of exercise buddies that helped with accountability. Pt reports they also used to be very active with spouse, but have not been able to be as active since COVID. Pt reports their job is very sedentary, their overall activity level is low currently. Pt reports history of fad dieting (very low calorie/no carb), states they want to work on eating in a less restricted fashion.  Pt reports usually taking lunch to work (salad or vegetables w/ lean protein) Pt reports their spouse enjoys to cook and will occasionally overserve them, tends to eat out with spouse, but won't eat out when alone. Pt reports getting hungry/bored in the evening, trying to snack healthier (carrots/celery and hummus, popcorn, dark chocolate, cuties)   Estimated daily fluid intake: 48-64 oz Supplements: Vit C, Calcium, Vit D&K,  CoQ10, Magnesium, MVI, Iron, Probiotic, Metamucil Sleep: Sleeps well Stress /self-care: Higher this year r/t work, Reading/comedy helps destress Current average weekly physical activity: ADLs   24-Hr Dietary Recall First Meal: [place] shake Snack: none Second Meal: [place] spinach/grilled chicken salad, 2 rolls, unsweet tea w/ lemon Snack: none Third Meal: Hibachi steak and zucchini, onions, mushrooms, rice, white sauce. Salad w/ ranch, miso soup Snack: 2 dark chocolate squares Beverages: Water, unsweet tea   NUTRITION DIAGNOSIS  -3.3 Overweight/obesity As related to excessive energy intake.  As evidenced by inability to lose weight for years, eating energy dense meals w/ spouse, sedentary lifestyle.   NUTRITION INTERVENTION  Nutrition education (E-1) on the following topics:  Educated patient on the two components of energy balance: Energy in (calories), and energy out (activity). Explain the role of negative energy balance in weight loss. Discussed options with patient to achieve a negative energy balance and how to best control energy in and energy out to accommodate their lifestyle. Educated patient on the recognition of energy dense vs. Nutrient dense foods and strategies to moderate energy dense foods choices  Handouts Provided Include  Balanced Plate  Learning Style & Readiness for Change Teaching method utilized: Visual & Auditory  Demonstrated degree of understanding via: Teach Back  Barriers to learning/adherence to lifestyle change: Shared meals w/ bf  Goals Established by Pt Continue to discuss meal options with your boyfriend when he is cooking for you. Work on continuing to moderate your energy dense foods (fried foods, baked goods, high fat foods). Plan on having one meal each week that contains an appropriate amount of the foods that you consider  to be less healthy. Allow yourself to moderate these in a healthy fashion. Begin your meal by eating your  vegetable(s), then follow it up with your protein, and finish with your carbohydrate! Begin to weigh yourself only once a week, do this each Monday morning. Look for weight loss of 0.5-1 lbs in a week.   MONITORING & EVALUATION Dietary intake, weekly physical activity, and weight change in 6-8 weeks.  Next Steps  Patient is to follow up with RD.

## 2023-07-11 NOTE — Patient Instructions (Addendum)
 Continue to discuss meal options with your boyfriend when he is cooking for you. Work on continuing to moderate your energy dense foods (fried foods, baked goods, high fat foods).  Plan on having one meal each week that contains an appropriate amount of the foods that you consider to be less healthy. Allow yourself to moderate these in a healthy fashion.  Begin your meal by eating your vegetable(s), then follow it up with your protein, and finish with your carbohydrate!  Begin to weigh yourself only once a week, do this each Monday morning. Look for weight loss of 0.5-1 lbs in a week.

## 2023-08-07 NOTE — Assessment & Plan Note (Signed)
 Lab Results  Component Value Date   HGBA1C 5.8 (H) 10/30/2022  Prediabetes, doing MNT

## 2023-08-07 NOTE — Progress Notes (Unsigned)
 Name: Monica Morgan   MRN: [id number]    DOB: 1960/11/12   Date:08/08/2023       Progress Note  Chief Complaint  Patient presents with   Establish Care     Subjective:   Monica Morgan is a 63 y.o. female, presents to clinic to establish primary care Previously est with NP Lynnie Saucier at [facility] Under Dr. Cassondra Cliff who recently passed away Here to est care and needs PCP care taken over  One of her main concerns is back issues, hx of upper back and shoulder and recently low back having trouble  - she goes to chiro for management on flexeril  5 mg TID likes to have on hand, her GYN did two small refills recently, she asks for larger refills here for her chronic issues  She has consulted with local specialist in [place] including cardiology, ENT, ortho, ophtho, dermatology and gyn   Persistant Afib post ablation, anticoagulated on eliquis  managed on diltiazem , flecainide  Has lasix /furosemide  - uses PRN for leg swelling, she states once every 3 months or so   OSA not on CPAP  Obesity - doing MNT working on diet/nutrition and lifestyle efforts first Wt Readings from Last 5 Encounters:  08/08/23 226 lb (102.5 kg)  07/11/23 228 lb 11.2 oz (103.7 kg)  04/03/23 234 lb (106.1 kg)  10/30/22 233 lb 9.6 oz (106 kg)  10/04/22 231 lb 3.2 oz (104.9 kg)   BMI Readings from Last 5 Encounters:  08/08/23 37.61 kg/m  07/11/23 38.06 kg/m  04/03/23 38.94 kg/m  10/30/22 38.87 kg/m  10/04/22 38.47 kg/m   Gyn care on estrogens and progesterone  Dr. Arnett Lanius  - requesting records today   Hypothyroid on 50 mcg levothyroxine  daily, last labs reviewed Lab Results  Component Value Date   TSH 1.88 10/30/2022   Allergies - on singulair  and sometimes she take antihistamines, she often has allergies causing mostly which causes throat clearing and cough, She has seen ENT no recurrent infections  Derm hx psoriasis? Rash/patches she states she recently established with  [facility]  Also reports history of elevated white blood cell count which she has seen hematology oncology for they have told her that is just her normal white blood cell count usually 12-13k She also reports history of iron deficiency and having to take iron supplements 3 times daily just to maintain her iron but she does not know the underlying cause   Current Outpatient Medications:    Ascorbic Acid (VITAMIN C) 500 MG CHEW, Chew by mouth. 2 times per day , Disp: , Rfl:    augmented betamethasone  dipropionate (DIPROLENE -AF) 0.05 % cream, APPLY VERY SPARINGLY TO PSORIASIS RASH (DO NOT USE ON FACE ! ), Disp: 50 g, Rfl: 5   Black Pepper-Turmeric 3-500 MG CAPS, Take by mouth., Disp: , Rfl:    Calcium  600-400 MG-UNIT CHEW, Chew 1 each by mouth 2 (two) times daily. , Disp: , Rfl:    Cholecalciferol  (VITAMIN D  PO), Take 10,000 Units by mouth daily., Disp: , Rfl:    Coenzyme Q10 (CO Q10) 100 MG CAPS, Take 100 mg by mouth daily. , Disp: , Rfl:    cyclobenzaprine  (FLEXERIL ) 5 MG tablet, TAKE 1 TABLET BY MOUTH 3 TIMES DAILY AS NEEDED FOR MUSCLE SPASM, Disp: 270 tablet, Rfl: 0   diltiazem  (CARDIZEM  CD) 120 MG 24 hr capsule, TAKE 1 CAPSULE BY MOUTH EVERY DAY, Disp: 90 capsule, Rfl: 3   DIVIGEL  0.5 MG/0.5GM GEL, SMARTSIG:1 Packet(s) T-DERMAL Daily, Disp: ,  Rfl:    ELIQUIS  5 MG TABS tablet, TAKE 1 TABLET BY MOUTH TWICE A DAY, Disp: 180 tablet, Rfl: 1   Estradiol  1 MG/GM GEL, Place onto the skin., Disp: , Rfl:    ezetimibe  (ZETIA ) 10 MG tablet, Take  1 tablet  Daily for Cholesterol                                                           /                                                                   TAKE                                         BY                                                 MOUTH, Disp: 90 tablet, Rfl: 3   ferrous sulfate 324 MG TBEC, Take 324 mg by mouth., Disp: , Rfl:    flecainide  (TAMBOCOR ) 100 MG tablet, TAKE 1 TABLET BY MOUTH EVERY 12 HOURS FOR AFIB, Disp: 180 tablet,  Rfl: 3   furosemide  (LASIX ) 20 MG tablet, TAKE 1 TABLET DAILY AS NEEDED FOR FLUID RETENTION, Disp: 90 tablet, Rfl: 1   levothyroxine  (SYNTHROID ) 50 MCG tablet, TAKE 1 TABLET DAILY ON EMPTY STOMACH WITH ONLY WATER FOR 30 MIN (NO ANTACID MEDS, CALCIUM  OR MAGNESIUM  FOR 4 HOURS & AVOID BIOTIN), Disp: 270 tablet, Rfl: 1   Magnesium  500 MG TABS, Take 500 mg by mouth every evening. , Disp: , Rfl:    Melatonin 1 MG CHEW, Chew by mouth. Prn, Disp: , Rfl:    montelukast  (SINGULAIR ) 10 MG tablet, TAKE 1 TABLET BY MOUTH DAILY FOR ALLERGIES, Disp: 90 tablet, Rfl: 3   Multiple Vitamin (MULTIVITAMIN WITH MINERALS) TABS tablet, Take 1 tablet by mouth daily., Disp: , Rfl:    Polyethyl Glycol-Propyl Glycol (SYSTANE OP), Place 1 drop into both eyes daily., Disp: , Rfl:    Probiotic Product (PROBIOTIC DAILY PO), Take 1 tablet by mouth daily. , Disp: , Rfl:    progesterone  (PROMETRIUM ) 100 MG capsule, TAKE 1 CAPSULE BY MOUTH EVERY DAY, Disp: 30 capsule, Rfl: 1   psyllium (METAMUCIL) 58.6 % powder, Take 1 packet by mouth. prn, Disp: , Rfl:   Current Facility-Administered Medications:    ipratropium-albuterol  (DUONEB) 0.5-2.5 (3) MG/3ML nebulizer solution 3 mL, 3 mL, Nebulization, Once, Langley Pippin, NP  Patient Active Problem List   Diagnosis Date Noted   History of malignant melanoma 05/04/2020   Multinodular thyroid  01/14/2019   Essential hypertension 06/04/2018   Psoriasis of scalp 12/04/2017   Persistent atrial fibrillation (HCC)    Leukocytosis 10/22/2015   Morbid obesity (HCC) 12/25/2014   Abnormal glucose 06/09/2014   Hypothyroidism 06/04/2013   Vitamin D  deficiency  Hyperlipidemia, mixed 12/20/2011    Past Surgical History:  Procedure Laterality Date   A-FLUTTER ABLATION N/A 02/08/2017   Procedure: A-Flutter Ablation;  Surgeon: Lei Pump, MD;  Location: [facility];  Service: Cardiovascular;  Laterality: N/A;   ATRIAL FIBRILLATION ABLATION N/A 04/11/2017   Procedure:  ATRIAL FIBRILLATION ABLATION;  Surgeon: Lei Pump, MD;  Location: [facility];  Service: Cardiovascular;  Laterality: N/A;   CARDIOVERSION N/A 10/25/2015   Procedure: CARDIOVERSION;  Surgeon: Jacqueline Matsu, MD;  Location: [facility] ENDOSCOPY;  Service: Cardiovascular;  Laterality: N/A;   CESAREAN SECTION  1980   CESAREAN SECTION WITH BILATERAL TUBAL LIGATION  1982   ELECTROPHYSIOLOGIC STUDY N/A 01/13/2016   Procedure: Atrial Fibrillation Ablation;  Surgeon: Will Cortland Ding, MD;  Location: [facility];  Service: Cardiovascular;  Laterality: N/A;   ENDOMETRIAL ABLATION  ~ 2012   HYSTEROSCOPY  03/28/2020   Dr. Arnett Lanius   MELANOMA EXCISION Right 10/1975   outer thigh "malignant"   REFRACTIVE SURGERY Bilateral 1990s   TEE WITHOUT CARDIOVERSION N/A 10/25/2015   Procedure: TRANSESOPHAGEAL ECHOCARDIOGRAM (TEE);  Surgeon: Jacqueline Matsu, MD;  Location: [facility] ENDOSCOPY;  Service: Cardiovascular;  Laterality: N/A;   TEE WITHOUT CARDIOVERSION N/A 01/11/2016   Procedure: TRANSESOPHAGEAL ECHOCARDIOGRAM (TEE);  Surgeon: Loyde Rule, MD;  Location: [facility] ENDOSCOPY;  Service: Cardiovascular;  Laterality: N/A;   TONSILLECTOMY  1967   TUBAL LIGATION      Family History  Problem Relation Age of Onset   Heart disease Mother    Hypertension Mother    Heart attack Mother    Heart disease Father    Diabetes Father    Hypertension Father    Heart attack Brother    Colon cancer Neg Hx    Stroke Neg Hx    Breast cancer Neg Hx     Social History   Tobacco Use   Smoking status: Never    Passive exposure: Never   Smokeless tobacco: Never  Vaping Use   Vaping status: Never Used  Substance Use Topics   Alcohol  use: Yes    Comment: rare since covid, social   Drug use: No     Allergies  Allergen Reactions   Lipitor [Atorvastatin] Other (See Comments)    "neck pain"   Metoprolol  Other (See Comments)    DIDN'T WORK FOR PATIENT AND GAINED WEIGHT MIGHT HAVE BEEN TAKEN WITH RYTHMOL    Phentermine  Other (See Comments)    Did not help, Insomnia    Rythmol [Propafenone] Other (See Comments)    "weight gain"   Sulfa Drugs Cross Reactors Other (See Comments)    "soreness all over"   Lisinopril  Cough    Health Maintenance  Topic Date Due   Zoster Vaccines- Shingrix (1 of 2) Never done   Cervical Cancer Screening (HPV/Pap Cotest)  Never done   COVID-19 Vaccine (8 - 2024-25 season) 08/24/2023 (Originally 04/28/2023)   INFLUENZA VACCINE  11/15/2023   MAMMOGRAM  03/06/2024   DTaP/Tdap/Td (2 - Td or Tdap) 06/22/2025   Fecal DNA (Cologuard)  08/30/2025   Hepatitis C Screening  Completed   HIV Screening  Completed   HPV VACCINES  Aged Out   Meningococcal B Vaccine  Aged Out    Chart Review Today: I personally reviewed active problem list, medication list, allergies, family history, social history, health maintenance, notes from last encounter, lab results, imaging with the patient/caregiver today.   Review of Systems  Constitutional: Negative.   HENT: Negative.  Eyes: Negative.   Respiratory: Negative.    Cardiovascular: Negative.   Gastrointestinal: Negative.   Endocrine: Negative.   Genitourinary: Negative.   Musculoskeletal: Negative.   Skin: Negative.   Allergic/Immunologic: Negative.   Neurological: Negative.   Hematological: Negative.   Psychiatric/Behavioral: Negative.    All other systems reviewed and are negative.    Objective:   Vitals:   08/08/23 1020  BP: 136/82  Pulse: 82  Resp: 16  Temp: 98.1 F (36.7 C)  TempSrc: Oral  SpO2: 98%  Weight: 226 lb (102.5 kg)  Height: 5\' 5"  (1.651 m)    Body mass index is 37.61 kg/m.  Physical Exam Vitals and nursing note reviewed.  Constitutional:      General: She is not in acute distress.    Appearance: Normal appearance. She is well-developed. She is obese. She is not ill-appearing, toxic-appearing or diaphoretic.  HENT:     Head: Normocephalic and atraumatic.     Right Ear: Tympanic  membrane, ear canal and external ear normal. There is no impacted cerumen.     Left Ear: Tympanic membrane, ear canal and external ear normal. There is no impacted cerumen.     Nose: Congestion and rhinorrhea present.     Mouth/Throat:     Mouth: Mucous membranes are moist.     Pharynx: Oropharynx is clear. No oropharyngeal exudate or posterior oropharyngeal erythema.  Eyes:     General: No scleral icterus.       Right eye: No discharge.        Left eye: No discharge.     Conjunctiva/sclera: Conjunctivae normal.  Neck:     Trachea: No tracheal deviation.  Cardiovascular:     Rate and Rhythm: Normal rate and regular rhythm.     Pulses: Normal pulses.     Heart sounds: Normal heart sounds. No murmur heard.    No gallop.  Pulmonary:     Effort: Pulmonary effort is normal. No respiratory distress.     Breath sounds: Normal breath sounds. No stridor. No wheezing, rhonchi or rales.  Musculoskeletal:     Right lower leg: No edema.     Left lower leg: No edema.  Skin:    General: Skin is warm and dry.     Coloration: Skin is not jaundiced or pale.     Findings: No lesion or rash.  Neurological:     Mental Status: She is alert.     Motor: No abnormal muscle tone.     Coordination: Coordination normal.     Gait: Gait normal.  Psychiatric:        Mood and Affect: Mood normal.        Behavior: Behavior normal.      Functional Status Survey: Is the patient deaf or have difficulty hearing?: No Does the patient have difficulty seeing, even when wearing glasses/contacts?: No Does the patient have difficulty concentrating, remembering, or making decisions?: No Does the patient have difficulty walking or climbing stairs?: No Does the patient have difficulty dressing or bathing?: No Does the patient have difficulty doing errands alone such as visiting a doctor's office or shopping?: No Results for orders placed or performed in visit on 10/30/22  CBC with Differential/Platelet    Collection Time: 10/30/22  4:28 PM  Result Value Ref Range   WBC 12.5 (H) 3.8 - 10.8 Thousand/uL   RBC 4.83 3.80 - 5.10 Million/uL   Hemoglobin 14.3 11.7 - 15.5 g/dL   HCT 32.9 51.8 - 84.1 %  MCV 88.6 80.0 - 100.0 fL   MCH 29.6 27.0 - 33.0 pg   MCHC 33.4 32.0 - 36.0 g/dL   RDW 14.7 82.9 - 56.2 %   Platelets 319 140 - 400 Thousand/uL   MPV 10.1 7.5 - 12.5 fL   Neutro Abs 7,463 1,500 - 7,800 cells/uL   Lymphs Abs 3,925 (H) 850 - 3,900 cells/uL   Absolute Monocytes 913 200 - 950 cells/uL   Eosinophils Absolute 125 15 - 500 cells/uL   Basophils Absolute 75 0 - 200 cells/uL   Neutrophils Relative % 59.7 %   Total Lymphocyte 31.4 %   Monocytes Relative 7.3 %   Eosinophils Relative 1.0 %   Basophils Relative 0.6 %  COMPLETE METABOLIC PANEL WITH GFR   Collection Time: 10/30/22  4:28 PM  Result Value Ref Range   Glucose, Bld 90 65 - 99 mg/dL   BUN 23 7 - 25 mg/dL   Creat 1.30 8.65 - 7.84 mg/dL   eGFR 75 > OR = 60 ON/GEX/5.28U1   BUN/Creatinine Ratio SEE NOTE: 6 - 22 (calc)   Sodium 139 135 - 146 mmol/L   Potassium 4.4 3.5 - 5.3 mmol/L   Chloride 101 98 - 110 mmol/L   CO2 28 20 - 32 mmol/L   Calcium  10.2 8.6 - 10.4 mg/dL   Total Protein 7.5 6.1 - 8.1 g/dL   Albumin 4.9 3.6 - 5.1 g/dL   Globulin 2.6 1.9 - 3.7 g/dL (calc)   AG Ratio 1.9 1.0 - 2.5 (calc)   Total Bilirubin 0.5 0.2 - 1.2 mg/dL   Alkaline phosphatase (APISO) 77 37 - 153 U/L   AST 16 10 - 35 U/L   ALT 18 6 - 29 U/L  Lipid panel   Collection Time: 10/30/22  4:28 PM  Result Value Ref Range   Cholesterol 199 <200 mg/dL   HDL 49 (L) > OR = 50 mg/dL   Triglycerides 324 (H) <150 mg/dL   LDL Cholesterol (Calc) 110 (H) mg/dL (calc)   Total CHOL/HDL Ratio 4.1 <5.0 (calc)   Non-HDL Cholesterol (Calc) 150 (H) <130 mg/dL (calc)  TSH   Collection Time: 10/30/22  4:28 PM  Result Value Ref Range   TSH 1.88 0.40 - 4.50 mIU/L  Hemoglobin A1C w/out eAG   Collection Time: 10/30/22  4:28 PM  Result Value Ref Range   Hgb A1c  MFr Bld 5.8 (H) <5.7 % of total Hgb  VITAMIN D  25 Hydroxy (Vit-D Deficiency, Fractures)   Collection Time: 10/30/22  4:28 PM  Result Value Ref Range   Vit D, 25-Hydroxy 75 30 - 100 ng/mL      Assessment & Plan:   Encounter to establish care with new doctor est care here today - reviewed dx, hx, meds, specialists OV and management, thorough chart review today and yesterday   Patient did want all of her medications refilled for 1 year and I explained that some medications may be appropriate for that and others may require follow-up every 6 months to ensure that refills and medication prescriptions are safe and effective with proper monitoring I did review with her that I would like for her cardiologist to manage the flecainide  -previously her PCP put in refills She did schedule a follow-up physical in July which is with another provider I explained that she is welcome to see the physician but it may be more difficult for continuity of care for a different provider to do her physical after I have gotten to know her  today and reviewed all of her medical history and have made specific plans for what we will do at her physical next -however her choice of provider or PCP is up to her  Did encourage her to plan on doing a physical once a year and a separate problem based office visit for her medication refills and diagnoses, which she agreed to today  Hyperlipidemia, mixed Assessment & Plan: Pt on zetia  and working on diet/lifestyle wishes to avoid statins, did do more cardiac testing and pt reports no CAD Recommend labs in about 3 months to continue monitoring Lab Results  Component Value Date   CHOL 199 10/30/2022   HDL 49 (L) 10/30/2022   LDLCALC 110 (H) 10/30/2022   TRIG 302 (H) 10/30/2022   CHOLHDL 4.1 10/30/2022     Orders: -     Ezetimibe ; Take 1 tablet (10 mg total) by mouth daily.  Dispense: 90 tablet; Refill: 1  Vitamin D  deficiency Assessment & Plan: Reviewed last labs which  showed vitamin D  is excellent, currently supplemented   Hypothyroidism, unspecified type Assessment & Plan: Lab Results  Component Value Date   TSH 1.88 10/30/2022  Euthyroid on current dose- 50 mcg levothyroxine  daily Recommend labs at next OV, did send in refills of current dose  Orders: -     Levothyroxine  Sodium; Take 1 tablet (50 mcg total) by mouth daily before breakfast. TAKE 1 TABLET DAILY ON EMPTY STOMACH WITH ONLY WATER FOR 30 MIN (NO ANTACID MEDS, CALCIUM  OR MAGNESIUM  FOR 4 HOURS & AVOID BIOTIN)  Dispense: 90 tablet; Refill: 1  Persistent atrial fibrillation [facility]) Assessment & Plan: Managed by cardiology Dr. Lawana Pray Flecainide , diltiazem  and anticoagulated on Eliquis  Recently did her cardiology follow-up in December On exam today regular rate and rhythm, she feels only rare palpitations and has no exertional symptoms   Essential hypertension Assessment & Plan: Blood pressure slightly above goal today on her current medications did review with her that the goal would be under 130/80 She is working on diet/lifestyle efforts and weight loss for her cardiovascular health, per cardiology is on diltiazem  BP Readings from Last 3 Encounters:  08/08/23 136/82  04/03/23 138/76  10/30/22 (!) 146/72     Morbid obesity (HCC) Doing MNT, working on diet/nutrition, lifestyle changes and weight loss Cardiology recommended she consider GLP meds - she's not interested in them now  Wt Readings from Last 5 Encounters:  08/08/23 226 lb (102.5 kg)  07/11/23 228 lb 11.2 oz (103.7 kg)  04/03/23 234 lb (106.1 kg)  10/30/22 233 lb 9.6 oz (106 kg)  10/04/22 231 lb 3.2 oz (104.9 kg)   BMI Readings from Last 5 Encounters:  08/08/23 37.61 kg/m  07/11/23 38.06 kg/m  04/03/23 38.94 kg/m  10/30/22 38.87 kg/m  10/04/22 38.47 kg/m    Abnormal glucose Assessment & Plan: Lab Results  Component Value Date   HGBA1C 5.8 (H) 10/30/2022  Prediabetes, doing MNT working on healthy  diet/nutrition efforts Recommend f/up labs, pt will do at her CPE    Seasonal allergic rhinitis, unspecified trigger Assessment & Plan: Currently flared up/poorly controlled on montelukast  only, advised her to also take a second-generation antihistamine daily, she may also want to try nasal sprays  Orders: -     Montelukast  Sodium; TAKE 1 TABLET BY MOUTH DAILY FOR ALLERGIES  Dispense: 90 tablet; Refill: 3  Prediabetes Assessment & Plan: See abnormal glucose above   Chronic thoracic back pain, unspecified back pain laterality Assessment & Plan: Chronic upper back and shoulder  muscle skeletal pain she states due to body mechanics and stress which she uses a chiropractor and Flexeril  to manage She asked for med refills today Did review the chart and verified long history of Flexeril  use did encourage her to use sparingly, reviewed central nervous system sedation effects, advised not to drive while taking the medication Also encouraged the patient to seek follow-up appointment if back pain has changed because other management may be indicated such as x-rays, physical therapy evaluation and treatment or Ortho or spine referral -     Cyclobenzaprine  HCl; TAKE 1 TABLET BY MOUTH 3 TIMES DAILY AS NEEDED FOR MUSCLE SPASM  Dispense: 270 tablet; Refill: 1   Multinodular thyroid  Assessment & Plan: Pt concerned about needed f/up, per chart review and pathology I see no indication for further imaging  Thyroid  biopsy 03/08/2020 - [facility] category II - benign - no further follow up indicated for any nodules due to benign biopsy and stability.    Thyroid  biopsy 02/03/2020 - [facility] category I    Per US  01/21/2020 Left inferior lobe nodule meeting criteria for biopsy; ordered  Otherwise unchanged/no follow up needed  A 03/2020 f/up CT neck was done which also did not report any abnormalities or indicated f/up imaging or monitoring  I did explain to pt today that often with thyroid  nodules or  goiter once a biopsy is done and negative often times there is no further imaging or monitoring required, however if symptoms should change reevaluation would be indicated   Psoriasis of scalp Assessment & Plan: Per [place]   Return for make sure CPE is scheduled july.   Adeline Hone, PA-C 08/08/23 10:33 AM

## 2023-08-08 ENCOUNTER — Ambulatory Visit: Admitting: Family Medicine

## 2023-08-08 ENCOUNTER — Other Ambulatory Visit: Payer: Self-pay

## 2023-08-08 ENCOUNTER — Encounter: Payer: Self-pay | Admitting: Family Medicine

## 2023-08-08 VITALS — BP 136/82 | HR 82 | Temp 98.1°F | Resp 16 | Ht 65.0 in | Wt 226.0 lb

## 2023-08-08 DIAGNOSIS — E782 Mixed hyperlipidemia: Secondary | ICD-10-CM

## 2023-08-08 DIAGNOSIS — R7303 Prediabetes: Secondary | ICD-10-CM | POA: Insufficient documentation

## 2023-08-08 DIAGNOSIS — R7309 Other abnormal glucose: Secondary | ICD-10-CM

## 2023-08-08 DIAGNOSIS — I10 Essential (primary) hypertension: Secondary | ICD-10-CM

## 2023-08-08 DIAGNOSIS — E039 Hypothyroidism, unspecified: Secondary | ICD-10-CM | POA: Diagnosis not present

## 2023-08-08 DIAGNOSIS — Z7689 Persons encountering health services in other specified circumstances: Secondary | ICD-10-CM

## 2023-08-08 DIAGNOSIS — L409 Psoriasis, unspecified: Secondary | ICD-10-CM

## 2023-08-08 DIAGNOSIS — I4819 Other persistent atrial fibrillation: Secondary | ICD-10-CM

## 2023-08-08 DIAGNOSIS — E559 Vitamin D deficiency, unspecified: Secondary | ICD-10-CM

## 2023-08-08 DIAGNOSIS — J302 Other seasonal allergic rhinitis: Secondary | ICD-10-CM

## 2023-08-08 DIAGNOSIS — M546 Pain in thoracic spine: Secondary | ICD-10-CM

## 2023-08-08 DIAGNOSIS — E042 Nontoxic multinodular goiter: Secondary | ICD-10-CM

## 2023-08-08 DIAGNOSIS — G8929 Other chronic pain: Secondary | ICD-10-CM | POA: Insufficient documentation

## 2023-08-08 MED ORDER — EZETIMIBE 10 MG PO TABS: 10.0000 mg | ORAL_TABLET | Freq: Every day | ORAL | 1 refills | Status: DC

## 2023-08-08 MED ORDER — MONTELUKAST SODIUM 10 MG PO TABS: ORAL_TABLET | ORAL | 3 refills | Status: DC

## 2023-08-08 MED ORDER — LEVOTHYROXINE SODIUM 50 MCG PO TABS: 50.0000 ug | ORAL_TABLET | Freq: Every day | ORAL | 1 refills | Status: DC

## 2023-08-08 MED ORDER — CYCLOBENZAPRINE HCL 5 MG PO TABS: ORAL_TABLET | ORAL | 1 refills | Status: DC

## 2023-08-08 NOTE — Assessment & Plan Note (Addendum)
 Lab Results  Component Value Date   TSH 1.88 10/30/2022  Euthyroid on current dose- 50 mcg levothyroxine  daily Recommend labs at next OV, did send in refills of current dose

## 2023-08-08 NOTE — Assessment & Plan Note (Signed)
 Reviewed last labs which showed vitamin D  is excellent, currently supplemented

## 2023-08-08 NOTE — Assessment & Plan Note (Signed)
 Chronic upper back and shoulder muscle skeletal pain she states due to body mechanics and stress which she uses a chiropractor and Flexeril  to manage She asked for med refills today Did review the chart and verified long history of Flexeril  use did encourage her to use sparingly, reviewed central nervous system sedation effects, advised not to drive while taking the medication Also encouraged the patient to seek follow-up appointment if back pain has changed because other management may be indicated such as x-rays, physical therapy evaluation and treatment or Ortho or spine referral

## 2023-08-08 NOTE — Assessment & Plan Note (Signed)
 Managed by cardiology Dr. Lawana Pray Flecainide , diltiazem  and anticoagulated on Eliquis  Recently did her cardiology follow-up in December On exam today regular rate and rhythm, she feels only rare palpitations and has no exertional symptoms

## 2023-08-08 NOTE — Assessment & Plan Note (Signed)
 Currently flared up/poorly controlled on montelukast  only, advised her to also take a second-generation antihistamine daily, she may also want to try nasal sprays

## 2023-08-08 NOTE — Assessment & Plan Note (Addendum)
 Pt on zetia  and working on diet/lifestyle wishes to avoid statins, did do more cardiac testing and pt reports no CAD Recommend labs in about 3 months to continue monitoring Lab Results  Component Value Date   CHOL 199 10/30/2022   HDL 49 (L) 10/30/2022   LDLCALC 110 (H) 10/30/2022   TRIG 302 (H) 10/30/2022   CHOLHDL 4.1 10/30/2022

## 2023-08-08 NOTE — Assessment & Plan Note (Signed)
 Blood pressure slightly above goal today on her current medications did review with her that the goal would be under 130/80 She is working on diet/lifestyle efforts and weight loss for her cardiovascular health, per cardiology is on diltiazem  BP Readings from Last 3 Encounters:  08/08/23 136/82  04/03/23 138/76  10/30/22 (!) 146/72

## 2023-08-08 NOTE — Assessment & Plan Note (Signed)
 Pt concerned about needed f/up, per chart review and pathology I see no indication for further imaging  Thyroid  biopsy 03/08/2020 - [facility] category II - benign - no further follow up indicated for any nodules due to benign biopsy and stability.    Thyroid  biopsy 02/03/2020 - [facility] category I    Per US  01/21/2020 Left inferior lobe nodule meeting criteria for biopsy; ordered  Otherwise unchanged/no follow up needed  A 03/2020 f/up CT neck was done which also did not report any abnormalities or indicated f/up imaging or monitoring  I did explain to pt today that often with thyroid  nodules or goiter once a biopsy is done and negative often times there is no further imaging or monitoring required, however if symptoms should change reevaluation would be indicated

## 2023-08-08 NOTE — Assessment & Plan Note (Signed)
 See abnormal glucose above

## 2023-08-08 NOTE — Assessment & Plan Note (Signed)
 Per [facility] dermatology

## 2023-08-19 ENCOUNTER — Other Ambulatory Visit: Payer: Self-pay | Admitting: Cardiology

## 2023-08-19 DIAGNOSIS — I4819 Other persistent atrial fibrillation: Secondary | ICD-10-CM

## 2023-08-19 NOTE — Telephone Encounter (Signed)
 Prescription refill request for Eliquis  received. Indication: AF Last office visit: 04/03/23  Almetta Armor MD Scr: 0.86 on 10/30/22  Epic Age: 63 Weight: 106.1kg  Based on above findings Eliquis  5mg  twice daily is the appropriate dose.  Refill approved.

## 2023-09-13 ENCOUNTER — Ambulatory Visit: Admitting: Dietician

## 2023-10-01 ENCOUNTER — Encounter: Payer: Self-pay | Admitting: Cardiology

## 2023-10-10 ENCOUNTER — Ambulatory Visit: Admitting: Student

## 2023-10-21 ENCOUNTER — Ambulatory Visit (INDEPENDENT_AMBULATORY_CARE_PROVIDER_SITE_OTHER): Admitting: Family Medicine

## 2023-10-21 ENCOUNTER — Encounter: Payer: Self-pay | Admitting: Family Medicine

## 2023-10-21 VITALS — BP 136/74 | HR 71 | Resp 16 | Ht 65.0 in | Wt 227.0 lb

## 2023-10-21 DIAGNOSIS — Z0000 Encounter for general adult medical examination without abnormal findings: Secondary | ICD-10-CM | POA: Diagnosis not present

## 2023-10-21 DIAGNOSIS — Z780 Asymptomatic menopausal state: Secondary | ICD-10-CM | POA: Diagnosis not present

## 2023-10-21 DIAGNOSIS — M1990 Unspecified osteoarthritis, unspecified site: Secondary | ICD-10-CM | POA: Insufficient documentation

## 2023-10-21 DIAGNOSIS — L4050 Arthropathic psoriasis, unspecified: Secondary | ICD-10-CM | POA: Insufficient documentation

## 2023-10-21 NOTE — Progress Notes (Unsigned)
 Patient: Monica Morgan, Female    DOB: [birth year], 63 y.o.   MRN: [id number] Leavy Mole, PA-C Visit Date: 10/21/2023  Today's Provider: Mole Leavy, PA-C   Chief Complaint  Patient presents with   Annual Exam   Subjective:   Annual physical exam:  Monica Morgan is a 63 y.o. female who presents today for complete physical exam:  Exercise/Activity:  1 day a week 20 min, some other walking not consistent exercise   Diet/nutrition:   trying to behave herself with salads, protein shakes in am Sleep:   SDOH Screenings   Food Insecurity: No Food Insecurity (10/17/2023)  Housing: Low Risk  (10/17/2023)  Transportation Needs: No Transportation Needs (10/17/2023)  Utilities: Not At Risk (10/21/2023)  Alcohol  Screen: Low Risk  (10/17/2023)  Depression (PHQ2-9): Low Risk  (10/21/2023)  Financial Resource Strain: Low Risk  (10/17/2023)  Physical Activity: Insufficiently Active (10/17/2023)  Social Connections: Socially Integrated (10/17/2023)  Stress: No Stress Concern Present (10/17/2023)  Tobacco Use: Low Risk  (10/21/2023)  Health Literacy: Adequate Health Literacy (10/21/2023)     Pt wished  *** discuss acute complaints  *** do routine f/up on chronic conditions today in addition to CPE. Advised pt of separate visit billing/coding  USPSTF grade A and B recommendations - reviewed and addressed today  Depression:  Phq 9 completed today by patient, was reviewed by me with patient in the room PHQ score is ***, pt feels ***    10/21/2023    2:39 PM 08/08/2023   10:19 AM 07/11/2023    8:19 AM 05/04/2021    3:22 PM  PHQ 2/9 Scores  PHQ - 2 Score 0 0 0 0      10/21/2023    2:39 PM 08/08/2023   10:19 AM 07/11/2023    8:19 AM 05/04/2021    3:22 PM 08/22/2020    1:37 PM  Depression screen PHQ 2/9  Decreased Interest 0 0 0 0 0  Down, Depressed, Hopeless 0 0 0 0 0  PHQ - 2 Score 0 0 0 0 0    Alcohol  screening: Flowsheet Row Office Visit from 10/21/2023 in [facility]   AUDIT-C Score 1     Immunizations and Health Maintenance: Health Maintenance  Topic Date Due   DEXA SCAN  10/01/2023   COVID-19 Vaccine (8 - 2024-25 season) 11/06/2023 (Originally 04/28/2023)   Zoster Vaccines- Shingrix (1 of 2) 01/21/2024 (Originally 09/30/1977)   Pneumococcal Vaccine: 50+ Years (1 of 1 - PCV) 10/20/2024 (Originally 09/30/2008)   INFLUENZA VACCINE  11/15/2023   MAMMOGRAM  03/06/2024   DTaP/Tdap/Td (2 - Td or Tdap) 06/22/2025   Fecal DNA (Cologuard)  08/30/2025   Cervical Cancer Screening (HPV/Pap Cotest)  05/19/2027   Hepatitis C Screening  Completed   HIV Screening  Completed   Hepatitis B Vaccines  Aged Out   HPV VACCINES  Aged Out   Meningococcal B Vaccine  Aged Out     Hep C Screening: done  STD testing and prevention (HIV/chl/gon/syphilis):  see above, no additional testing desired by pt today  Intimate partner violence:safe  Sexual History/Pain during Intercourse:  not sexually active no problems with sex  Menstrual History/LMP/Abnormal Bleeding: post menopausal   Patient's last menstrual period was 10/29/2013 (approximate).  Incontinence Symptoms:  none   Breast cancer:  November due  Last Mammogram: *see HM list above BRCA gene screening: ***  Cervical cancer screening:  aged out, last norm Pt *** family hx of cancers -  breast, ovarian, uterine, colon:     Osteoporosis:   Discussion on osteoporosis per age, including high calcium  and vitamin D  supplementation, weight bearing exercises Pt is supplementing with daily calcium /Vit D. Due Bone scan/dexa Roughly experienced menopause at age 61   Skin cancer:  malignant melanoma  -  62 y/o Hx of skin CA  Spot on face going to dermatology for eval and goes annually Discussed atypical lesions   Colorectal cancer:   Colonoscopy is UTD cologuard UTD done last year  Discussed concerning signs and sx of CRC, pt denies ***  Lung cancer:   Low Dose CT Chest recommended if Age 67-80 years, 20  pack-year currently smoking OR have quit w/in 15years. Patient does not qualify.    Social History   Tobacco Use   Smoking status: Never    Passive exposure: Never   Smokeless tobacco: Never  Vaping Use   Vaping status: Never Used  Substance Use Topics   Alcohol  use: Yes    Comment: rare since covid, social   Drug use: No     Flowsheet Row Office Visit from 10/21/2023 in [facility]  AUDIT-C Score 1     Family History  Problem Relation Age of Onset   Heart disease Mother    Hypertension Mother    Heart attack Mother    Heart disease Father    Diabetes Father    Hypertension Father    Heart attack Brother    Colon cancer Neg Hx    Stroke Neg Hx    Breast cancer Neg Hx      Blood pressure/Hypertension: BP Readings from Last 3 Encounters:  10/21/23 136/74  08/08/23 136/82  04/03/23 138/76    Weight/Obesity: Wt Readings from Last 3 Encounters:  10/21/23 227 lb (103 kg)  08/08/23 226 lb (102.5 kg)  07/11/23 228 lb 11.2 oz (103.7 kg)   BMI Readings from Last 3 Encounters:  10/21/23 37.77 kg/m  08/08/23 37.61 kg/m  07/11/23 38.06 kg/m     Lipids:  Lab Results  Component Value Date   CHOL 199 10/30/2022   CHOL 206 (H) 05/08/2022   CHOL 186 10/11/2021   Lab Results  Component Value Date   HDL 49 (L) 10/30/2022   HDL 49 (L) 05/08/2022   HDL 50 10/11/2021   Lab Results  Component Value Date   LDLCALC 110 (H) 10/30/2022   LDLCALC 116 (H) 05/08/2022   LDLCALC 98 10/11/2021   Lab Results  Component Value Date   TRIG 302 (H) 10/30/2022   TRIG 305 (H) 05/08/2022   TRIG 269 (H) 10/11/2021   Lab Results  Component Value Date   CHOLHDL 4.1 10/30/2022   CHOLHDL 4.2 05/08/2022   CHOLHDL 3.7 10/11/2021   No results found for: LDLDIRECT Based on the results of lipid panel his/her cardiovascular risk factor ( using Poole Cohort )  in the next 10 years is: The 10-year ASCVD risk score (Arnett DK, et al., 2019) is: 8.8%    Values used to calculate the score:     Age: 60 years     Clincally relevant sex: Female     Is Non-Hispanic African American: No     Diabetic: No     Tobacco smoker: No     Systolic Blood Pressure: 136 mmHg     Is BP treated: Yes     HDL Cholesterol: 49 mg/dL     Total Cholesterol: 199 mg/dL  Glucose:  Glucose, Bld  Date Value  Ref Range Status  10/30/2022 90 65 - 99 mg/dL Final    Comment:    .            Fasting reference interval .   05/08/2022 91 65 - 99 mg/dL Final    Comment:    .            Fasting reference interval .   10/11/2021 99 65 - 99 mg/dL Final    Comment:    .            Fasting reference interval .     Advanced Care Planning:  A voluntary discussion about advance care planning including the explanation and discussion of advance directives.   Discussed health care proxy and Living will, and the patient was able to identify a health care proxy as son's  has ACP .   Patient {DOES_DOES [id number]} have a living will at present time.   Social History       Social History   Socioeconomic History   Marital status: Divorced    Spouse name: Not on file   Number of children: 2   Years of education: Not on file   Highest education level: Master's degree (e.g., MA, MS, MEng, MEd, MSW, MBA)  Occupational History   Not on file  Tobacco Use   Smoking status: Never    Passive exposure: Never   Smokeless tobacco: Never  Vaping Use   Vaping status: Never Used  Substance and Sexual Activity   Alcohol  use: Yes    Comment: rare since covid, social   Drug use: No   Sexual activity: Not Currently    Partners: Male    Birth control/protection: None, Patch    Comment: partner with health issues  Other Topics Concern   Not on file  Social History Narrative   Not on file   Social Drivers of Health   Financial Resource Strain: Low Risk  (10/17/2023)   Overall Financial Resource Strain (CARDIA)    Difficulty of Paying Living Expenses: Not hard at all   Food Insecurity: No Food Insecurity (10/17/2023)   Hunger Vital Sign    Worried About Running Out of Food in the Last Year: Never true    Ran Out of Food in the Last Year: Never true  Transportation Needs: No Transportation Needs (10/17/2023)   PRAPARE - Administrator, Civil Service (Medical): No    Lack of Transportation (Non-Medical): No  Physical Activity: Insufficiently Active (10/17/2023)   Exercise Vital Sign    Days of Exercise per Week: 1 day    Minutes of Exercise per Session: 20 min  Stress: No Stress Concern Present (10/17/2023)   [employer] of Occupational Health - Occupational Stress Questionnaire    Feeling of Stress: Not at all  Social Connections: Socially Integrated (10/17/2023)   Social Connection and Isolation Panel    Frequency of Communication with Friends and Family: More than three times a week    Frequency of Social Gatherings with Friends and Family: Once a week    Attends Religious Services: More than 4 times per year    Active Member of [employer] or Organizations: Yes    Attends Engineer, structural: More than 4 times per year    Marital Status: Living with partner    Family History        Family History  Problem Relation Age of Onset   Heart disease Mother    Hypertension Mother  Heart attack Mother    Heart disease Father    Diabetes Father    Hypertension Father    Heart attack Brother    Colon cancer Neg Hx    Stroke Neg Hx    Breast cancer Neg Hx     Patient Active Problem List   Diagnosis Date Noted   Osteoarthritis 10/21/2023   Psoriatic arthritis (HCC) 10/21/2023   Prediabetes 08/08/2023   Seasonal allergic rhinitis 08/08/2023   Chronic thoracic back pain 08/08/2023   History of malignant melanoma 05/04/2020   Multinodular thyroid  01/14/2019   Essential hypertension 06/04/2018   Psoriasis of scalp 12/04/2017   Persistent atrial fibrillation (HCC)    Leukocytosis 10/22/2015   Morbid obesity (HCC) 12/25/2014    Abnormal glucose 06/09/2014   Hypothyroidism 06/04/2013   Vitamin D  deficiency    Hyperlipidemia, mixed 12/20/2011    Past Surgical History:  Procedure Laterality Date   A-FLUTTER ABLATION N/A 02/08/2017   Procedure: A-Flutter Ablation;  Surgeon: Inocencio Soyla Lunger, MD;  Location: [facility] INVASIVE [facility] LAB;  Service: Cardiovascular;  Laterality: N/A;   ATRIAL FIBRILLATION ABLATION N/A 04/11/2017   Procedure: ATRIAL FIBRILLATION ABLATION;  Surgeon: Inocencio Soyla Lunger, MD;  Location: [facility] INVASIVE CV LAB;  Service: Cardiovascular;  Laterality: N/A;   CARDIOVERSION N/A 10/25/2015   Procedure: CARDIOVERSION;  Surgeon: Wilbert JONELLE Bihari, MD;  Location: [facility] ENDOSCOPY;  Service: Cardiovascular;  Laterality: N/A;   CESAREAN SECTION  1980   CESAREAN SECTION WITH BILATERAL TUBAL LIGATION  1982   ELECTROPHYSIOLOGIC STUDY N/A 01/13/2016   Procedure: Atrial Fibrillation Ablation;  Surgeon: Will Lunger Inocencio, MD;  Location: [facility] IN[facility]ASIVE CV LAB;  Service: Cardiovascular;  Laterality: N/A;   ENDOMETRIAL ABLATION  ~ 2012   HYSTEROSCOPY  03/28/2020   Dr. Rendell   MELANOMA EXCISION Right 10/1975   outer thigh malignant   REFRACTIVE SURGERY Bilateral 1990s   TEE WITHOUT CARDIOVERSION N/A 10/25/2015   Procedure: TRANSESOPHAGEAL ECHOCARDIOGRAM (TEE);  Surgeon: Wilbert JONELLE Bihari, MD;  Location: [facility] ENDOSCOPY;  Service: Cardiovascular;  Laterality: N/A;   TEE WITHOUT CARDIOVERSION N/A 01/11/2016   Procedure: TRANSESOPHAGEAL ECHOCARDIOGRAM (TEE);  Surgeon: Maude JAYSON Emmer, MD;  Location: [facility] ENDOSCOPY;  Service: Cardiovascular;  Laterality: N/A;   TONSILLECTOMY  1967   TUBAL LIGATION       Current Outpatient Medications:    Ascorbic Acid (VITAMIN C) 500 MG CHEW, Chew by mouth. 2 times per day , Disp: , Rfl:    augmented betamethasone  dipropionate (DIPROLENE -AF) 0.05 % cream, APPLY VERY SPARINGLY TO PSORIASIS RASH (DO NOT USE ON FACE ! ), Disp: 50 g, Rfl: 5   Black Pepper-Turmeric 3-500 MG CAPS, Take by mouth., Disp: , Rfl:     Calcium  600-400 MG-UNIT CHEW, Chew 1 each by mouth 2 (two) times daily. , Disp: , Rfl:    Cholecalciferol  (VITAMIN D  PO), Take 10,000 Units by mouth daily., Disp: , Rfl:    Coenzyme Q10 (CO Q10) 100 MG CAPS, Take 100 mg by mouth daily. , Disp: , Rfl:    cyclobenzaprine  (FLEXERIL ) 5 MG tablet, TAKE 1 TABLET BY MOUTH 3 TIMES DAILY AS NEEDED FOR MUSCLE SPASM, Disp: 270 tablet, Rfl: 1   diltiazem  (CARDIZEM  CD) 120 MG 24 hr capsule, TAKE 1 CAPSULE BY MOUTH EVERY DAY, Disp: 90 capsule, Rfl: 3   DIVIGEL  0.5 MG/0.5GM GEL, SMARTSIG:1 Packet(s) T-DERMAL Daily, Disp: , Rfl:    ELIQUIS  5 MG TABS tablet, TAKE 1 TABLET BY MOUTH TWICE A DAY, Disp: 180 tablet, Rfl: 1   ezetimibe  (  ZETIA ) 10 MG tablet, Take 1 tablet (10 mg total) by mouth daily., Disp: 90 tablet, Rfl: 1   ferrous sulfate 324 MG TBEC, Take 324 mg by mouth., Disp: , Rfl:    flecainide  (TAMBOCOR ) 100 MG tablet, TAKE 1 TABLET BY MOUTH EVERY 12 HOURS FOR AFIB, Disp: 180 tablet, Rfl: 3   furosemide  (LASIX ) 20 MG tablet, TAKE 1 TABLET DAILY AS NEEDED FOR FLUID RETENTION, Disp: 90 tablet, Rfl: 1   levothyroxine  (SYNTHROID ) 50 MCG tablet, Take 1 tablet (50 mcg total) by mouth daily before breakfast. TAKE 1 TABLET DAILY ON EMPTY STOMACH WITH ONLY WATER FOR 30 MIN (NO ANTACID MEDS, CALCIUM  OR MAGNESIUM  FOR 4 HOURS & AVOID BIOTIN), Disp: 90 tablet, Rfl: 1   Magnesium  500 MG TABS, Take 500 mg by mouth every evening. , Disp: , Rfl:    Melatonin 1 MG CHEW, Chew by mouth. Prn, Disp: , Rfl:    montelukast  (SINGULAIR ) 10 MG tablet, TAKE 1 TABLET BY MOUTH DAILY FOR ALLERGIES, Disp: 90 tablet, Rfl: 3   Multiple Vitamin (MULTIVITAMIN WITH MINERALS) TABS tablet, Take 1 tablet by mouth daily., Disp: , Rfl:    Polyethyl Glycol-Propyl Glycol (SYSTANE OP), Place 1 drop into both eyes daily., Disp: , Rfl:    Probiotic Product (PROBIOTIC DAILY PO), Take 1 tablet by mouth daily. , Disp: , Rfl:    progesterone  (PROMETRIUM ) 100 MG capsule, TAKE 1 CAPSULE BY MOUTH EVERY DAY,  Disp: 30 capsule, Rfl: 1   psyllium (METAMUCIL) 58.6 % powder, Take 1 packet by mouth. prn, Disp: , Rfl:   Allergies  Allergen Reactions   Lipitor [Atorvastatin] Other (See Comments)    neck pain   Metoprolol  Other (See Comments)    DIDN'T WORK FOR PATIENT AND GAINED WEIGHT MIGHT HAVE BEEN TAKEN WITH RYTHMOL   Phentermine  Other (See Comments)    Did not help, Insomnia    Rythmol [Propafenone] Other (See Comments)    weight gain   Sulfa Drugs Cross Reactors Other (See Comments)    soreness all over   Lisinopril  Cough    Patient Care Team: Leavy Mole, PA-C as PCP - General (Family Medicine) Inocencio Soyla Lunger, MD as PCP - Cardiology (Cardiology) Inocencio Soyla Lunger, MD as PCP - Electrophysiology (Cardiology)   Chart Review: ***  Review of Systems        Objective:   Vitals:  Vitals:   10/21/23 1450  BP: 136/74  Pulse: 71  Resp: 16  SpO2: 97%  Weight: 227 lb (103 kg)  Height: 5' 5 (1.651 m)    Body mass index is 37.77 kg/m.  Physical Exam    Fall Risk:    10/21/2023    2:39 PM 08/08/2023   10:19 AM 07/11/2023    8:18 AM 01/27/2020    9:24 PM 07/20/2019   11:39 PM  Fall Risk   Falls in the past year? 0 0 0  0   Number falls in past yr: 0 0     Injury with Fall? 0 0     Risk for fall due to : No Fall Risks No Fall Risks  No Fall Risks No Fall Risks  Follow up Falls prevention discussed Falls evaluation completed  Falls evaluation completed;Education provided;Falls prevention discussed  Falls prevention discussed;Education provided;Falls evaluation completed      Data saved with a previous flowsheet row definition    Functional Status Survey: Is the patient deaf or have difficulty hearing?: No Does the patient have difficulty seeing, even when wearing  glasses/contacts?: No Does the patient have difficulty concentrating, remembering, or making decisions?: No Does the patient have difficulty walking or climbing stairs?: No Does the patient have  difficulty dressing or bathing?: No Does the patient have difficulty doing errands alone such as visiting a doctor's office or shopping?: No   Assessment & Plan:    CPE completed today  USPSTF grade A and B recommendations reviewed with patient; age-appropriate recommendations, preventive care, screening tests, etc discussed and encouraged; healthy living encouraged; see AVS for patient education given to patient  Discussed importance of 150 minutes of physical activity weekly, AHA exercise recommendations given to pt in AVS/handout  Discussed importance of healthy diet:  eating lean meats and proteins, avoiding trans fats and saturated fats, avoid simple sugars and excessive carbs in diet, eat 6 servings of fruit/vegetables daily and drink plenty of water and avoid sweet beverages.    Recommended pt to do annual eye exam and routine dental exams/cleanings  Depression, alcohol , fall screening completed as documented above and per flowsheets  Advance Care planning information and packet discussed and offered today, encouraged pt to discuss with family members/spouse/partner/friends and complete Advanced directive packet and bring copy to office   Reviewed Health Maintenance: Health Maintenance  Topic Date Due   DEXA SCAN  10/01/2023   COVID-19 Vaccine (8 - 2024-25 season) 11/06/2023 (Originally 04/28/2023)   Zoster Vaccines- Shingrix (1 of 2) 01/21/2024 (Originally 09/30/1977)   Pneumococcal Vaccine: 50+ Years (1 of 1 - PCV) 10/20/2024 (Originally 09/30/2008)   INFLUENZA VACCINE  11/15/2023   MAMMOGRAM  03/06/2024   DTaP/Tdap/Td (2 - Td or Tdap) 06/22/2025   Fecal DNA (Cologuard)  08/30/2025   Cervical Cancer Screening (HPV/Pap Cotest)  05/19/2027   Hepatitis C Screening  Completed   HIV Screening  Completed   Hepatitis B Vaccines  Aged Out   HPV VACCINES  Aged Out   Meningococcal B Vaccine  Aged Out    Immunizations: Immunization History  Administered Date(s) Administered    PFIZER Comirnaty(Gray Top)Covid-19 Tri-Sucrose Vaccine 03/05/2021, 03/03/2023   PFIZER(Purple Top)SARS-COV-2 Vaccination 06/29/2019, 07/20/2019, 03/16/2020, 10/10/2020   PPD Test 12/07/2013, 12/22/2014, 08/17/2016, 11/27/2017, 04/14/2019   Pfizer(Comirnaty)Fall Seasonal Vaccine 12 years and older 01/22/2022   Tdap 06/23/2015   Vaccines:  HPV: up to at age 70 , ask insurance if age between 40-45  Shingrix: 53-64 yo and ask insurance if covered when patient above 41 yo Pneumonia: *** educated and discussed with patient. Flu: *** educated and discussed with patient. COVID:      ICD-10-CM   1. Well adult exam  Z00.00           Michelene Cower, PA-C 10/21/23 3:18 PM  [facility]

## 2023-10-21 NOTE — Patient Instructions (Addendum)
 Send us  a copy of your advance directives so we can scan into your chart   [facility] [address], [place], [place] [address] Scheduling phone #: [phone] Call to schedule your dexa - you may try to arrange it with your mammogram   Health Maintenance  Topic Date Due   DEXA scan (bone density measurement)  10/01/2023   COVID-19 Vaccine (8 - 2024-25 season) 11/06/2023*   Zoster (Shingles) Vaccine (1 of 2) 01/21/2024*   Pneumococcal Vaccine for age over 54 (1 of 1 - PCV) 10/20/2024*   Flu Shot  11/15/2023   Mammogram  03/06/2024   DTaP/Tdap/Td vaccine (2 - Td or Tdap) 06/22/2025   Cologuard (Stool DNA test)  08/30/2025   Pap with HPV screening  05/19/2027   Hepatitis C Screening  Completed   HIV Screening  Completed   Hepatitis B Vaccine  Aged Out   HPV Vaccine  Aged Out   Meningitis B Vaccine  Aged Out  *Topic was postponed. The date shown is not the original due date.

## 2023-10-22 LAB — COMPREHENSIVE METABOLIC PANEL WITH GFR
AG Ratio: 2.1 (calc) (ref 1.0–2.5)
ALT: 17 U/L (ref 6–29)
AST: 18 U/L (ref 10–35)
Albumin: 5 g/dL (ref 3.6–5.1)
Alkaline phosphatase (APISO): 71 U/L (ref 37–153)
BUN: 24 mg/dL (ref 7–25)
CO2: 27 mmol/L (ref 20–32)
Calcium: 10.3 mg/dL (ref 8.6–10.4)
Chloride: 100 mmol/L (ref 98–110)
Creat: 0.89 mg/dL (ref 0.50–1.05)
Globulin: 2.4 g/dL (ref 1.9–3.7)
Glucose, Bld: 91 mg/dL (ref 65–99)
Potassium: 4.7 mmol/L (ref 3.5–5.3)
Sodium: 138 mmol/L (ref 135–146)
Total Bilirubin: 0.5 mg/dL (ref 0.2–1.2)
Total Protein: 7.4 g/dL (ref 6.1–8.1)
eGFR: 72 mL/min/1.73m2 (ref >=60)

## 2023-10-22 LAB — CBC WITH DIFFERENTIAL/PLATELET
Absolute Lymphocytes: 3920 {cells}/uL — ABNORMAL HIGH (ref 850–3900)
Absolute Monocytes: 917 {cells}/uL (ref 200–950)
Basophils Absolute: 70 {cells}/uL (ref 0–200)
Basophils Relative: 0.5 %
Eosinophils Absolute: 125 {cells}/uL (ref 15–500)
Eosinophils Relative: 0.9 %
HCT: 44.7 % (ref 35.0–45.0)
Hemoglobin: 14.5 g/dL (ref 11.7–15.5)
MCH: 29.4 pg (ref 27.0–33.0)
MCHC: 32.4 g/dL (ref 32.0–36.0)
MCV: 90.7 fL (ref 80.0–100.0)
MPV: 10.3 fL (ref 7.5–12.5)
Monocytes Relative: 6.6 %
Neutro Abs: 8868 {cells}/uL — ABNORMAL HIGH (ref 1500–7800)
Neutrophils Relative %: 63.8 %
Platelets: 352 Thousand/uL (ref 140–400)
RBC: 4.93 Million/uL (ref 3.80–5.10)
RDW: 12.8 % (ref 11.0–15.0)
Total Lymphocyte: 28.2 %
WBC: 13.9 Thousand/uL — ABNORMAL HIGH (ref 3.8–10.8)

## 2023-10-22 LAB — HEMOGLOBIN A1C
Hgb A1c MFr Bld: 5.7 % — ABNORMAL HIGH (ref <5.7)
Mean Plasma Glucose: 117 mg/dL
eAG (mmol/L): 6.5 mmol/L

## 2023-10-22 LAB — LIPID PANEL
Cholesterol: 194 mg/dL (ref <200)
HDL: 49 mg/dL — ABNORMAL LOW (ref >=50)
LDL Cholesterol (Calc): 104 mg/dL — ABNORMAL HIGH
Non-HDL Cholesterol (Calc): 145 mg/dL — ABNORMAL HIGH (ref <130)
Total CHOL/HDL Ratio: 4 (calc) (ref <5.0)
Triglycerides: 290 mg/dL — ABNORMAL HIGH (ref <150)

## 2023-10-22 LAB — TSH: TSH: 2.22 m[IU]/L (ref 0.40–4.50)

## 2023-10-22 LAB — VITAMIN D 25 HYDROXY (VIT D DEFICIENCY, FRACTURES): Vit D, 25-Hydroxy: 88 ng/mL (ref 30–100)

## 2023-10-23 ENCOUNTER — Ambulatory Visit: Payer: Self-pay | Admitting: Family Medicine

## 2023-10-29 ENCOUNTER — Encounter: Payer: Self-pay | Admitting: Student

## 2023-10-29 ENCOUNTER — Ambulatory Visit: Attending: Student | Admitting: Student

## 2023-10-29 VITALS — BP 120/76 | HR 63 | Ht 65.0 in | Wt 224.0 lb

## 2023-10-29 DIAGNOSIS — I483 Typical atrial flutter: Secondary | ICD-10-CM | POA: Diagnosis not present

## 2023-10-29 DIAGNOSIS — I10 Essential (primary) hypertension: Secondary | ICD-10-CM | POA: Diagnosis not present

## 2023-10-29 DIAGNOSIS — I4819 Other persistent atrial fibrillation: Secondary | ICD-10-CM

## 2023-10-29 NOTE — Patient Instructions (Signed)
 Medication Instructions:  Your physician recommends that you continue on your current medications as directed. Please refer to the Current Medication list given to you today.  *If you need a refill on your cardiac medications before your next appointment, please call your pharmacy*  Lab Work: None ordered If you have labs (blood work) drawn today and your tests are completely normal, you will receive your results only by: MyChart Message (if you have MyChart) OR A paper copy in the mail If you have any lab test that is abnormal or we need to change your treatment, we will call you to review the results.  Follow-Up: At [facility], you and your health needs are our priority.  As part of our continuing mission to provide you with exceptional heart care, our providers are all part of one team.  This team includes your primary Cardiologist (physician) and Advanced Practice Providers or APPs (Physician Assistants and Nurse Practitioners) who all work together to provide you with the care you need, when you need it.  Your next appointment:   6 month(s)  Provider:   Bambi Lever "Michaelle Adolphus, PA-C

## 2023-10-29 NOTE — Progress Notes (Signed)
  Electrophysiology Office Note:   Date:  10/29/2023  ID:  Monica Morgan, DOB [birth year], MRN [id number]  Primary Cardiologist: Will Gladis Norton, MD Electrophysiologist: Soyla Gladis Norton, MD      History of Present Illness:   Monica Morgan is a 63 y.o. female with h/o a fib, flutter, OSA, HTN, and obesity seen today for routine electrophysiology followup.   Since last being seen in our clinic the patient reports doing OK. She does have more SOB in the summer, especially with inclines. No set aside exercise currently. Working on diet but having trouble cutting out carbs.  Her boyfriend is a Chartered certified accountant, which makes it even harder.  Otherwise, she denies chest pain, palpitations,  PND, orthopnea, nausea, vomiting, dizziness, syncope, edema, weight gain, or early satiety.  Denies snoring.   Review of systems complete and found to be negative unless listed in HPI.   EP Information / Studies Reviewed:    EKG is ordered today. Personal review as below.  EKG Interpretation Date/Time:  Tuesday October 29 2023 08:20:52 EDT Ventricular Rate:  63 PR Interval:  202 QRS Duration:  126 QT Interval:  472 QTC Calculation: 483 R Axis:   -27  Text Interpretation: Normal sinus rhythm Non-specific intra-ventricular conduction block Minimal voltage criteria for LVH, may be normal variant ( Cornell product ) When compared with ECG of 03-Apr-2023 09:01, Previous ECG has undetermined rhythm, needs review Confirmed by Lesia Sharper [id number]) on 10/29/2023 8:27:07 AM    Arrhythmia/Device History S/p PVI 12/2015 S/p CTI 01/2017 S/p redo ablation 03/2017   Physical Exam:   VS:  BP 120/76 (BP Location: Right Arm, Patient Position: Sitting, Cuff Size: Large)   Pulse 63   Ht 5' 5 (1.651 m)   Wt 224 lb (101.6 kg)   LMP 10/29/2013 (Approximate)   SpO2 98%   BMI 37.28 kg/m    Wt Readings from Last 3 Encounters:  10/29/23 224 lb (101.6 kg)  10/21/23 227 lb (103 kg)  08/08/23 226 lb (102.5 kg)      GEN: No acute distress NECK: No JVD; No carotid bruits CARDIAC: Regular rate and rhythm, no murmurs, rubs, gallops RESPIRATORY:  Clear to auscultation without rales, wheezing or rhonchi  ABDOMEN: Soft, non-tender, non-distended EXTREMITIES:  No edema; No deformity   ASSESSMENT AND PLAN:    Persistent AF Typical Flutter EKG today shows NSR with stable intervals Continue flecainide  Continue eliquis  5 mg BID for CHA2DS2/VASc of at least 4  Secondary hypercoagulable state Pt on Eliquis  as above    HTN Stable on current regimen   Sleep disorder breathing Several risk factors, no h/o sleep eval.  Consider if SOB does not improve.  Prior echo WNL.   Follow up with EP Team in 6 months  Signed, Sharper Prentice Lesia, PA-C

## 2023-11-05 ENCOUNTER — Other Ambulatory Visit: Payer: Self-pay | Admitting: Cardiology

## 2024-01-21 ENCOUNTER — Ambulatory Visit
Admission: RE | Admit: 2024-01-21 | Discharge: 2024-01-21 | Disposition: A | Discharge status: Home / Self Care | Source: Ambulatory Visit | Attending: Family Medicine | Admitting: Family Medicine

## 2024-01-21 DIAGNOSIS — Z780 Asymptomatic menopausal state: Secondary | ICD-10-CM | POA: Diagnosis present

## 2024-02-01 ENCOUNTER — Other Ambulatory Visit: Payer: Self-pay | Admitting: Family Medicine

## 2024-02-01 DIAGNOSIS — E039 Hypothyroidism, unspecified: Secondary | ICD-10-CM

## 2024-02-04 ENCOUNTER — Other Ambulatory Visit: Payer: Self-pay | Admitting: Cardiology

## 2024-02-04 DIAGNOSIS — I4819 Other persistent atrial fibrillation: Secondary | ICD-10-CM

## 2024-02-04 NOTE — Telephone Encounter (Signed)
 Requested by interface sure scripts. Last OV 3 months ago. Requested Prescriptions  Pending Prescriptions Disp Refills   levothyroxine  (SYNTHROID ) 50 MCG tablet [Pharmacy Med Name: LEVOTHYROXINE  50 MCG TABLET] 90 tablet 1    Sig: TAKE 1 TABLET (50 MCG TOTAL) BY MOUTH DAILY BEFORE BREAKFAST. TAKE 1 TABLET DAILY ON EMPTY STOMACH WITH ONLY WATER FOR 30 MIN (NO ANTACID MEDS, CALCIUM  OR MAGNESIUM  FOR 4 HOURS & AVOID BIOTIN)     Endocrinology:  Hypothyroid Agents Passed - 02/04/2024  9:43 AM      Passed - TSH in normal range and within 360 days    TSH  Date Value Ref Range Status  10/21/2023 2.22 0.40 - 4.50 mIU/L Final         Passed - Valid encounter within last 12 months    Recent Outpatient Visits           3 months ago Well adult exam   [facility] Leavy Mole, PA-C   6 months ago Encounter to establish care with new doctor   [facility] Leavy Mole, PA-C               cyclobenzaprine  (FLEXERIL ) 5 MG tablet [Pharmacy Med Name: CYCLOBENZAPRINE  5 MG TABLET] 270 tablet 1    Sig: TAKE 1 TABLET BY MOUTH THREE TIMES A DAY AS NEEDED FOR MUSCLE SPASM     Not Delegated - Analgesics:  Muscle Relaxants Failed - 02/04/2024  9:43 AM      Failed - This refill cannot be delegated      Passed - Valid encounter within last 6 months    Recent Outpatient Visits           3 months ago Well adult exam   [facility] Leavy Mole, PA-C   6 months ago Encounter to establish care with new doctor   [facility] Leavy Mole, PA-C

## 2024-02-04 NOTE — Telephone Encounter (Signed)
 Requested medication (s) are due for refill today: na  Requested medication (s) are on the active medication list: yes  Last refill:  08/08/23 #270 1 refills  Future visit scheduled: yes 04/22/24  Notes to clinic:  not delegated per protocol. Do you want to refill Rx?     Requested Prescriptions  Pending Prescriptions Disp Refills   cyclobenzaprine  (FLEXERIL ) 5 MG tablet [Pharmacy Med Name: CYCLOBENZAPRINE  5 MG TABLET] 270 tablet 1    Sig: TAKE 1 TABLET BY MOUTH THREE TIMES A DAY AS NEEDED FOR MUSCLE SPASM     Not Delegated - Analgesics:  Muscle Relaxants Failed - 02/04/2024  9:44 AM      Failed - This refill cannot be delegated      Passed - Valid encounter within last 6 months    Recent Outpatient Visits           3 months ago Well adult exam   [facility] Leavy Mole, PA-C   6 months ago Encounter to establish care with new doctor   [facility] Leavy Mole, PA-C              Signed Prescriptions Disp Refills   levothyroxine  (SYNTHROID ) 50 MCG tablet 90 tablet 1    Sig: TAKE 1 TABLET (50 MCG TOTAL) BY MOUTH DAILY BEFORE BREAKFAST. TAKE 1 TABLET DAILY ON EMPTY STOMACH WITH ONLY WATER FOR 30 MIN (NO ANTACID MEDS, CALCIUM  OR MAGNESIUM  FOR 4 HOURS & AVOID BIOTIN)     Endocrinology:  Hypothyroid Agents Passed - 02/04/2024  9:44 AM      Passed - TSH in normal range and within 360 days    TSH  Date Value Ref Range Status  10/21/2023 2.22 0.40 - 4.50 mIU/L Final         Passed - Valid encounter within last 12 months    Recent Outpatient Visits           3 months ago Well adult exam   [facility] Leavy Mole, PA-C   6 months ago Encounter to establish care with new doctor   [facility] Leavy Mole, PA-C

## 2024-02-05 NOTE — Telephone Encounter (Signed)
 Pt last saw Jodie Passey, [place] on 10/29/23, last labs 10/21/23 Creat 0.89, age 63, weight 101.6kg, based on specified criteria pt is on appropriate dosage of Eliquis  5mg  BID for afib.  Will refill rx.

## 2024-04-06 ENCOUNTER — Other Ambulatory Visit: Payer: Self-pay | Admitting: Internal Medicine

## 2024-04-06 DIAGNOSIS — Z1231 Encounter for screening mammogram for malignant neoplasm of breast: Secondary | ICD-10-CM

## 2024-04-20 ENCOUNTER — Ambulatory Visit: Payer: Self-pay

## 2024-04-20 NOTE — Telephone Encounter (Signed)
 FYI Only or Action Required?: FYI only for provider: appointment scheduled on 04/22/24.  Patient was last seen in primary care on 10/21/2023 by Tapia, Leisa, PA-C.  Called Nurse Triage reporting Vaginal Bleeding.  Symptoms began today.  Interventions attempted: Nothing.  Symptoms are: stable.  Triage Disposition: See Physician Within 24 Hours  Patient/caregiver understands and will follow disposition?: Yes               Copied from CRM #[id number]. Topic: Clinical - Red Word Triage >> Apr 20, 2024 12:40 PM Viola F wrote: Red Word that prompted transfer to Nurse Triage: Patient having vaginal bleeding and abdomen pain - requesting appointment Reason for Disposition  Taking Coumadin (warfarin) or other strong blood thinner, or known bleeding disorder (e.g., thrombocytopenia)  Answer Assessment - Initial Assessment Questions 1. BLEEDING SEVERITY: Describe the bleeding that you are having. How much bleeding is there?      Dry- brown blood- small amount   2. ONSET: When did the bleeding begin? Is it continuing now?     Today   3. MENSTRUAL PERIOD: When was the last normal menstrual period? How is this different than your period?      She was in her mid 83's   4. REGULARITY: How regular are your periods?     N/A  5. ABDOMEN PAIN: Do you have any pain? How bad is the pain?  (e.g., Scale 0-10; none, mild, moderate, or severe)     Abdominal pain in mid x 2 weeks- mild crampy     8. HORMONE MEDICINES: Are you taking any hormone medicines, prescription or over-the-counter? (e.g., birth control pills, estrogen)     Progesterone , and Estrogen   9. BLOOD THINNER MEDICINES: Do you take any blood thinners? (e.g., Coumadin / warfarin, Pradaxa / dabigatran, aspirin)     Eliquis    10. CAUSE: What do you think is causing the bleeding? (e.g., recent gyn surgery, recent gyn procedure; known bleeding disorder, cervical cancer, polycystic ovarian disease,  fibroids)         Unsure   11. HEMODYNAMIC STATUS: Are you weak or feeling lightheaded? If Yes, ask: Can you stand and walk normally?        No   12. OTHER SYMPTOMS: What other symptoms are you having with the bleeding? (e.g., passed tissue, vaginal discharge, fever, menstrual-type cramps)        menstrual-type cramps-mild      Patient called in to triage with complaints of vaginal bleeding and abdominal pain. This has been ongoing since today.  The patient stated the blood is brown, like a spotting color, small amount. Does not soak through pads or panty liner.    Appointment scheduled for further evaluation; Patient agrees with the plan of care, and will reach out if symptoms worsen or persist.  Protocols used: Vaginal Bleeding - Abnormal-A-AH

## 2024-04-22 ENCOUNTER — Ambulatory Visit: Admitting: Internal Medicine

## 2024-04-22 ENCOUNTER — Other Ambulatory Visit (HOSPITAL_COMMUNITY)
Admission: RE | Admit: 2024-04-22 | Discharge: 2024-04-22 | Disposition: A | Discharge status: Home / Self Care | Source: Ambulatory Visit | Attending: Internal Medicine | Admitting: Internal Medicine

## 2024-04-22 ENCOUNTER — Ambulatory Visit: Admitting: Family Medicine

## 2024-04-22 VITALS — BP 128/82 | HR 65 | Temp 98.4°F | Resp 16 | Ht 65.0 in | Wt 233.1 lb

## 2024-04-22 DIAGNOSIS — E039 Hypothyroidism, unspecified: Secondary | ICD-10-CM | POA: Diagnosis not present

## 2024-04-22 DIAGNOSIS — Z124 Encounter for screening for malignant neoplasm of cervix: Secondary | ICD-10-CM | POA: Diagnosis present

## 2024-04-22 DIAGNOSIS — Z7901 Long term (current) use of anticoagulants: Secondary | ICD-10-CM | POA: Diagnosis not present

## 2024-04-22 DIAGNOSIS — N950 Postmenopausal bleeding: Secondary | ICD-10-CM | POA: Diagnosis present

## 2024-04-22 NOTE — Progress Notes (Signed)
 "  Acute Office Visit  Subjective:     Patient ID: Monica Morgan, female    DOB: Aug 01, 1960, 64 y.o.   MRN: [id number]  Chief Complaint  Patient presents with   Vaginal Bleeding    For 3 days    Vaginal Bleeding Associated symptoms include abdominal pain. Pertinent negatives include no chills, dysuria, fever, flank pain, frequency, hematuria or urgency.   Patient is in today for vaginal bleeding x 3 days.   Discussed the use of AI scribe software for clinical note transcription with the patient, who gave verbal consent to proceed.  History of Present Illness Monica Morgan is a 64 year old female with atrial fibrillation who presents with postmenopausal vaginal bleeding.  She has had vaginal bleeding for three days, resembling a light menstrual period with cramping. It began as brown discharge and progressed to light red spotting, and is not heavy. Her last menstrual period was over ten years ago.  She takes Eliquis  for atrial fibrillation status post three ablations, and progesterone  for hot flashes. She is inconsistent with estradiol  gel use. There have been no recent medication changes.  She has no other abnormal bleeding in stool or urine but noticed a dark clot in her urine last night without dysuria. Over the past month she had intermittent sharp abdominal pains lasting about an hour, though not recently.  She has thyroid  issues but no recent thyroid -related symptoms reported today.    Review of Systems  Constitutional:  Negative for chills and fever.  Gastrointestinal:  Positive for abdominal pain. Negative for blood in stool.  Genitourinary:  Positive for vaginal bleeding. Negative for dysuria, flank pain, frequency, hematuria and urgency.        Objective:    BP 128/82 (Cuff Size: Large)   Pulse 65   Temp 98.4 F (36.9 C) (Oral)   Resp 16   Ht 5' 5 (1.651 m)   Wt 233 lb 1.6 oz (105.7 kg)   LMP 10/29/2013   SpO2 98%   BMI 38.79 kg/m  BP  Readings from Last 3 Encounters:  04/22/24 128/82  10/29/23 120/76  10/21/23 136/74   Wt Readings from Last 3 Encounters:  04/22/24 233 lb 1.6 oz (105.7 kg)  10/29/23 224 lb (101.6 kg)  10/21/23 227 lb (103 kg)      Physical Exam Exam conducted with a chaperone present.  Constitutional:      Appearance: Normal appearance.  HENT:     Head: Normocephalic and atraumatic.  Eyes:     Conjunctiva/sclera: Conjunctivae normal.  Cardiovascular:     Rate and Rhythm: Normal rate and regular rhythm.  Pulmonary:     Effort: Pulmonary effort is normal.     Breath sounds: Normal breath sounds.  Chest:  Breasts:    Right: Normal.     Left: Normal.  Genitourinary:    Comments: External genitalia within normal limits.  Vaginal mucosa pink, moist, normal rugae.  Unable to completely view cervix due to blood in the vaginal vault.  Lymphadenopathy:     Upper Body:     Right upper body: No supraclavicular, axillary or pectoral adenopathy.     Left upper body: No supraclavicular, axillary or pectoral adenopathy.  Skin:    General: Skin is warm and dry.  Neurological:     General: No focal deficit present.     Mental Status: She is alert. Mental status is at baseline.  Psychiatric:        Mood and Affect:  Mood normal.        Behavior: Behavior normal.     No results found for any visits on 04/22/24.      Assessment & Plan:   Assessment & Plan Postmenopausal bleeding Differential includes hormonal or anatomic changes, or malignancy. Inconsistent estradiol  gel use noted. Does take Progesterone  orally but is consistent, no missed doses.  - Performed Pap smear and pelvic exam. - Ordered transvaginal pelvic ultrasound. - Ordered blood work to rule out thyroid  issues or other metabolic causes for symptoms.  - Keep appointment with Theatre Manager in February.   Atrial fibrillation, on anticoagulation On Eliquis  with no recent episodes reported.  History of hypothyroidism Thyroid   function to be re-evaluated due to potential link with postmenopausal bleeding. - Ordered thyroid  function tests.  General health maintenance Mammogram scheduled. - Continue with scheduled mammogram.  - Cytology - PAP - US  PELVIC COMPLETE WITH TRANSVAGINAL; Future - CBC w/Diff/Platelet - Comprehensive Metabolic Panel (CMET) - TSH  Return for already scheduled.  Sharyle Fischer, DO   "

## 2024-04-23 ENCOUNTER — Ambulatory Visit: Payer: Self-pay | Admitting: Internal Medicine

## 2024-04-23 DIAGNOSIS — N950 Postmenopausal bleeding: Secondary | ICD-10-CM

## 2024-04-23 LAB — CBC WITH DIFFERENTIAL/PLATELET
Absolute Lymphocytes: 3661 {cells}/uL (ref 850–3900)
Absolute Monocytes: 922 {cells}/uL (ref 200–950)
Basophils Absolute: 77 {cells}/uL (ref 0–200)
Basophils Relative: 0.6 %
Eosinophils Absolute: 205 {cells}/uL (ref 15–500)
Eosinophils Relative: 1.6 %
HCT: 44.2 % (ref 35.9–46.0)
Hemoglobin: 14.3 g/dL (ref 11.7–15.5)
MCH: 28.8 pg (ref 27.0–33.0)
MCHC: 32.4 g/dL (ref 31.6–35.4)
MCV: 88.9 fL (ref 81.4–101.7)
MPV: 10.5 fL (ref 7.5–12.5)
Monocytes Relative: 7.2 %
Neutro Abs: 7936 {cells}/uL — ABNORMAL HIGH (ref 1500–7800)
Neutrophils Relative %: 62 %
Platelets: 347 Thousand/uL (ref 140–400)
RBC: 4.97 Million/uL (ref 3.80–5.10)
RDW: 12.5 % (ref 11.0–15.0)
Total Lymphocyte: 28.6 %
WBC: 12.8 Thousand/uL — ABNORMAL HIGH (ref 3.8–10.8)

## 2024-04-23 LAB — COMPREHENSIVE METABOLIC PANEL WITH GFR
AG Ratio: 1.9 (calc) (ref 1.0–2.5)
ALT: 17 U/L (ref 6–29)
AST: 19 U/L (ref 10–35)
Albumin: 4.8 g/dL (ref 3.6–5.1)
Alkaline phosphatase (APISO): 76 U/L (ref 37–153)
BUN: 23 mg/dL (ref 7–25)
CO2: 27 mmol/L (ref 20–32)
Calcium: 9.5 mg/dL (ref 8.6–10.4)
Chloride: 101 mmol/L (ref 98–110)
Creat: 0.87 mg/dL (ref 0.50–1.05)
Globulin: 2.5 g/dL (ref 1.9–3.7)
Glucose, Bld: 91 mg/dL (ref 65–99)
Potassium: 4.7 mmol/L (ref 3.5–5.3)
Sodium: 139 mmol/L (ref 135–146)
Total Bilirubin: 0.5 mg/dL (ref 0.2–1.2)
Total Protein: 7.3 g/dL (ref 6.1–8.1)
eGFR: 74 mL/min/1.73m2

## 2024-04-23 LAB — CYTOLOGY - PAP
Adequacy: ABSENT
Comment: NEGATIVE
Diagnosis: NEGATIVE
High risk HPV: NEGATIVE

## 2024-04-23 LAB — TSH: TSH: 2.78 m[IU]/L (ref 0.40–4.50)

## 2024-04-29 ENCOUNTER — Ambulatory Visit
Admission: RE | Admit: 2024-04-29 | Discharge: 2024-04-29 | Disposition: A | Discharge status: Home / Self Care | Source: Ambulatory Visit | Attending: Internal Medicine | Admitting: Internal Medicine

## 2024-04-29 DIAGNOSIS — N950 Postmenopausal bleeding: Secondary | ICD-10-CM | POA: Diagnosis present

## 2024-05-04 ENCOUNTER — Encounter: Payer: Self-pay | Admitting: Internal Medicine

## 2024-05-05 ENCOUNTER — Ambulatory Visit
Admission: RE | Admit: 2024-05-05 | Discharge: 2024-05-05 | Disposition: A | Source: Ambulatory Visit | Attending: Internal Medicine

## 2024-05-05 DIAGNOSIS — Z1231 Encounter for screening mammogram for malignant neoplasm of breast: Secondary | ICD-10-CM

## 2024-05-14 ENCOUNTER — Ambulatory Visit: Payer: Self-pay | Admitting: Internal Medicine

## 2024-05-15 ENCOUNTER — Encounter: Payer: Self-pay | Admitting: Internal Medicine

## 2024-05-15 ENCOUNTER — Ambulatory Visit: Admitting: Internal Medicine

## 2024-05-15 VITALS — BP 130/78 | HR 75 | Temp 97.7°F | Resp 18 | Ht 65.0 in | Wt 236.5 lb

## 2024-05-15 DIAGNOSIS — E782 Mixed hyperlipidemia: Secondary | ICD-10-CM

## 2024-05-15 DIAGNOSIS — E039 Hypothyroidism, unspecified: Secondary | ICD-10-CM

## 2024-05-15 DIAGNOSIS — J302 Other seasonal allergic rhinitis: Secondary | ICD-10-CM

## 2024-05-15 DIAGNOSIS — R9389 Abnormal findings on diagnostic imaging of other specified body structures: Secondary | ICD-10-CM

## 2024-05-15 DIAGNOSIS — I10 Essential (primary) hypertension: Secondary | ICD-10-CM

## 2024-05-15 DIAGNOSIS — L4050 Arthropathic psoriasis, unspecified: Secondary | ICD-10-CM

## 2024-05-15 DIAGNOSIS — M546 Pain in thoracic spine: Secondary | ICD-10-CM

## 2024-05-15 DIAGNOSIS — I4819 Other persistent atrial fibrillation: Secondary | ICD-10-CM

## 2024-05-15 DIAGNOSIS — Z85820 Personal history of malignant melanoma of skin: Secondary | ICD-10-CM

## 2024-05-15 DIAGNOSIS — Z23 Encounter for immunization: Secondary | ICD-10-CM

## 2024-05-15 DIAGNOSIS — E042 Nontoxic multinodular goiter: Secondary | ICD-10-CM

## 2024-05-15 DIAGNOSIS — G8929 Other chronic pain: Secondary | ICD-10-CM

## 2024-05-15 DIAGNOSIS — N950 Postmenopausal bleeding: Secondary | ICD-10-CM

## 2024-05-15 DIAGNOSIS — R7303 Prediabetes: Secondary | ICD-10-CM

## 2024-05-15 LAB — POCT GLYCOSYLATED HEMOGLOBIN (HGB A1C): Hemoglobin A1C: 5.7 % — AB (ref 4.0–5.6)

## 2024-05-15 MED ORDER — MONTELUKAST SODIUM 10 MG PO TABS: ORAL_TABLET | ORAL | 3 refills | Status: AC

## 2024-05-15 MED ORDER — DILTIAZEM HCL ER COATED BEADS 120 MG PO CP24: 120.0000 mg | ORAL_CAPSULE | Freq: Every day | ORAL | 1 refills | Status: AC

## 2024-05-15 MED ORDER — CYCLOBENZAPRINE HCL 5 MG PO TABS: 5.0000 mg | ORAL_TABLET | Freq: Every day | ORAL | 1 refills | Status: AC

## 2024-05-15 MED ORDER — LEVOTHYROXINE SODIUM 50 MCG PO TABS: 50.0000 ug | ORAL_TABLET | Freq: Every day | ORAL | 1 refills | Status: AC

## 2024-05-15 MED ORDER — EZETIMIBE 10 MG PO TABS: 10.0000 mg | ORAL_TABLET | Freq: Every day | ORAL | 1 refills | Status: AC

## 2024-05-15 MED ORDER — FLECAINIDE ACETATE 100 MG PO TABS: ORAL_TABLET | ORAL | 1 refills | Status: AC

## 2024-05-15 NOTE — Progress Notes (Signed)
 "  New Patient Office Visit  Subjective    Patient ID: Monica Morgan, female    DOB: December 15, 1960  Age: 64 y.o. MRN: [id number]  CC:  Chief Complaint  Patient presents with   transfer of care   Medication Refill   Medical Management of Chronic Issues    HPI Monica Morgan presents to for TOC.   Discussed the use of AI scribe software for clinical note transcription with the patient, who gave verbal consent to proceed.  History of Present Illness Monica Morgan is a 64 year old female who presents for a transfer of care appointment and management of her ongoing medical issues.  She has abnormal uterine bleeding and was told her endometrial lining is thick. She is trying to expedite an endometrial biopsy due to stress and poor sleep and notes prior biopsies were technically difficult because of the thick lining.  She has atrial fibrillation on Eliquis  5 mg twice daily, flecainide  100 mg twice daily, and diltiazem  120 mg once daily. She has had three ablations and has not had recent AFib episodes but is running low on these medications.  She has thyroid  nodules, including a subcentimeter left upper pole nodule last imaged in December 2021. She is due for a repeat thyroid  ultrasound and takes levothyroxine .  She has psoriatic arthritis involving her elbows and scalp, treated with topical Diprolene  and betamethasone . She cannot use systemic therapies because of her other medical problems.  She had malignant melanoma at age 85 with regular dermatology follow-up. She has noticed a new skin lesion on her abdomen that she plans to have evaluated.  She has prediabetes, with an A1c of 5.7 in July, and monitors this. She has high cholesterol treated with Zetia .  She has throat clearing and cough after prolonged speaking, which she attributes to sinus drainage. She uses liquid Claritin  as needed for allergy symptoms.  She has reduced her alcohol  intake, especially wine, which  has lowered her need for Lasix . She sleeps on her side and uses a supportive pillow to keep a neutral position at night.   Hypertension/A.Fib: -Medications: Lasix  20 mg PRN, Diltiazem  120 mg, Eliquis  5 mg BID, Flecainide  100 mg BID  -s/p 3 ablations in the past  -Patient is compliant with above medications and reports no side effects. -Denies any SOB, CP, vision changes, LE edema or symptoms of hypotension -Follows with Cardiology, has an upcoming appointment in March  HLD: -Medications: Zetia  10 mg -Patient is compliant with above medications and reports no side effects.  -Last lipid panel: Lipid Panel     Component Value Date/Time   CHOL 194 10/21/2023 1546   TRIG 290 (H) 10/21/2023 1546   HDL 49 (L) 10/21/2023 1546   CHOLHDL 4.0 10/21/2023 1546   VLDL 46 (H) 08/16/2016 1647   LDLCALC 104 (H) 10/21/2023 1546   Hypothyroidism: -Medications: Levothyroxine  50 mcg -Patient is compliant with the above medication (s) at the above dose and reports no medication side effects.  -Denies weight changes, cold./heat intolerance, skin changes, anxiety/palpitations  -Last TSH: 1/26 2.78 -History of thyroid  nodules, last US  and FNA in 2021  Pre-Diabetes -Last A1c 5.7% 7/25 -Not on medications  Environmental Allergies:  -Currently on Singulair    Psoriatic Arthritis:  -Currently on Diprolene , follows with Dermatology   History of Melanoma -s/p multiple revisions on right upper thigh when the patient was 43 -Follows with Dermatology yearly   Health Maintenance:  -Blood work UTD -Mammogram 1/26 Birads-1 -Cologuard 5/24 negative  -  Prevnar 20 today  Outpatient Encounter Medications as of 05/15/2024  Medication Sig   Ascorbic Acid (VITAMIN C) 500 MG CHEW Chew by mouth. 2 times per day    augmented betamethasone  dipropionate (DIPROLENE -AF) 0.05 % cream APPLY VERY SPARINGLY TO PSORIASIS RASH (DO NOT USE ON FACE ! )   Black Pepper-Turmeric 3-500 MG CAPS Take by mouth.   Calcium  600-400  MG-UNIT CHEW Chew 1 each by mouth 2 (two) times daily.    Cholecalciferol  (VITAMIN D  PO) Take 10,000 Units by mouth daily.   Coenzyme Q10 (CO Q10) 100 MG CAPS Take 100 mg by mouth daily.    cyclobenzaprine  (FLEXERIL ) 5 MG tablet TAKE 1 TABLET BY MOUTH THREE TIMES A DAY AS NEEDED FOR MUSCLE SPASM   diltiazem  (CARDIZEM  CD) 120 MG 24 hr capsule TAKE 1 CAPSULE BY MOUTH EVERY DAY   DIVIGEL  0.5 MG/0.5GM GEL SMARTSIG:1 Packet(s) T-DERMAL Daily   ELIQUIS  5 MG TABS tablet TAKE 1 TABLET BY MOUTH TWICE A DAY   Estradiol  1 MG/GM GEL SMARTSIG:1 Packet(s) T-DERMAL Daily   ezetimibe  (ZETIA ) 10 MG tablet Take 1 tablet (10 mg total) by mouth daily.   ferrous sulfate 324 MG TBEC Take 324 mg by mouth.   flecainide  (TAMBOCOR ) 100 MG tablet TAKE 1 TABLET BY MOUTH EVERY 12 HOURS FOR AFIB   furosemide  (LASIX ) 20 MG tablet TAKE 1 TABLET DAILY AS NEEDED FOR FLUID RETENTION   levothyroxine  (SYNTHROID ) 50 MCG tablet TAKE 1 TABLET (50 MCG TOTAL) BY MOUTH DAILY BEFORE BREAKFAST. TAKE 1 TABLET DAILY ON EMPTY STOMACH WITH ONLY WATER FOR 30 MIN (NO ANTACID MEDS, CALCIUM  OR MAGNESIUM  FOR 4 HOURS & AVOID BIOTIN)   Magnesium  500 MG TABS Take 500 mg by mouth every evening.    Melatonin 1 MG CHEW Chew by mouth. Prn   montelukast  (SINGULAIR ) 10 MG tablet TAKE 1 TABLET BY MOUTH DAILY FOR ALLERGIES   Multiple Vitamin (MULTIVITAMIN WITH MINERALS) TABS tablet Take 1 tablet by mouth daily.   Polyethyl Glycol-Propyl Glycol (SYSTANE OP) Place 1 drop into both eyes daily.   Probiotic Product (PROBIOTIC DAILY PO) Take 1 tablet by mouth daily.    progesterone  (PROMETRIUM ) 100 MG capsule TAKE 1 CAPSULE BY MOUTH EVERY DAY   psyllium (METAMUCIL) 58.6 % powder Take 1 packet by mouth. prn   No facility-administered encounter medications on file as of 05/15/2024.    Past Medical History:  Diagnosis Date   Allergy    Anemia ?   Should be in MyChart - taking iron supplements as a result   Arthritis    probably in my knees; maybe in my  hands (04/11/2017)   Asthma attack 10/2015 X 1   Atrial flutter (HCC) 10/2015   Atrial flutter with rapid ventricular response (HCC) 10/20/2015   Glaucoma 06/03/2023   Laser surgery completed last month   Heart murmur    comes and goes (04/11/2017)   Hepatitis 1972   don't know what kind   High cholesterol    Hypothyroidism    Malignant melanoma (HCC) 10/1975   R leg as a teen   Malignant melanoma of leg (HCC)    right thigh   PAF (paroxysmal atrial fibrillation) (HCC)    Pneumonia 1966; 1967   left lung collapsed one of these times   Psoriasis    Rosacea    Skin cancer 09/15/1975   Malignant melanoma on right thigh.  No further issues after surgery in July 1977.   Vitamin D  deficiency     Past  Surgical History:  Procedure Laterality Date   A-FLUTTER ABLATION N/A 02/08/2017   Procedure: A-Flutter Ablation;  Surgeon: Inocencio Soyla Lunger, MD;  Location: [facility];  Service: Cardiovascular;  Laterality: N/A;   ATRIAL FIBRILLATION ABLATION N/A 04/11/2017   Procedure: ATRIAL FIBRILLATION ABLATION;  Surgeon: Inocencio Soyla Lunger, MD;  Location: [facility];  Service: Cardiovascular;  Laterality: N/A;   CARDIOVERSION N/A 10/25/2015   Procedure: CARDIOVERSION;  Surgeon: Wilbert JONELLE Bihari, MD;  Location: [facility];  Service: Cardiovascular;  Laterality: N/A;   CESAREAN SECTION  1980   CESAREAN SECTION WITH BILATERAL TUBAL LIGATION  1982   ELECTROPHYSIOLOGIC STUDY N/A 01/13/2016   Procedure: Atrial Fibrillation Ablation;  Surgeon: Will Lunger Inocencio, MD;  Location: [facility];  Service: Cardiovascular;  Laterality: N/A;   ENDOMETRIAL ABLATION  ~ 2012   EYE SURGERY  06/03/2023   for glaucoma   HYSTEROSCOPY  03/28/2020   Dr. Rendell   JOINT REPLACEMENT  02/02/2022   Right hip replaced   MELANOMA EXCISION Right 10/1975   outer thigh malignant   REFRACTIVE SURGERY Bilateral 1990s   TEE WITHOUT CARDIOVERSION N/A 10/25/2015   Procedure: TRANSESOPHAGEAL  ECHOCARDIOGRAM (TEE);  Surgeon: Wilbert JONELLE Bihari, MD;  Location: [facility] ENDOSCOPY;  Service: Cardiovascular;  Laterality: N/A;   TEE WITHOUT CARDIOVERSION N/A 01/11/2016   Procedure: TRANSESOPHAGEAL ECHOCARDIOGRAM (TEE);  Surgeon: Maude JAYSON Emmer, MD;  Location: [facility] ENDOSCOPY;  Service: Cardiovascular;  Laterality: N/A;   TONSILLECTOMY  1967   TUBAL LIGATION      Family History  Problem Relation Age of Onset   Heart disease Mother    Hypertension Mother    Heart attack Mother    Arthritis Mother    COPD Mother    Hearing loss Mother    Miscarriages / Stillbirths Mother    Vision loss Mother    Varicose Veins Mother    Heart disease Father    Diabetes Father    Hypertension Father    Arthritis Father    Obesity Father    Vision loss Father    Arthritis Maternal Grandmother    Cancer Maternal Grandmother    Vision loss Maternal Grandmother    Heart attack Brother    Early death Maternal Uncle    Hypertension Brother    Obesity Brother    Colon cancer Neg Hx    Stroke Neg Hx    Breast cancer Neg Hx     Social History   Socioeconomic History   Marital status: Divorced    Spouse name: Not on file   Number of children: 2   Years of education: Not on file   Highest education level: Master's degree (e.g., MA, MS, MEng, MEd, MSW, MBA)  Occupational History   Not on file  Tobacco Use   Smoking status: Never    Passive exposure: Never   Smokeless tobacco: Never  Vaping Use   Vaping status: Never Used  Substance and Sexual Activity   Alcohol  use: Yes    Comment: rare since covid, social   Drug use: No   Sexual activity: Not Currently    Partners: Male    Birth control/protection: None, Patch    Comment: partner with health issues  Other Topics Concern   Not on file  Social History Narrative   Not on file   Social Drivers of Health   Tobacco Use: Low Risk (05/15/2024)   Patient History    Smoking Tobacco Use: Never    Smokeless Tobacco  Use: Never    Passive Exposure:  Never  Financial Resource Strain: Low Risk (05/11/2024)   Overall Financial Resource Strain (CARDIA)    Difficulty of Paying Living Expenses: Not hard at all  Food Insecurity: No Food Insecurity (05/11/2024)   Epic    Worried About Programme Researcher, Broadcasting/film/video in the Last Year: Never true    Ran Out of Food in the Last Year: Never true  Transportation Needs: No Transportation Needs (05/11/2024)   Epic    Lack of Transportation (Medical): No    Lack of Transportation (Non-Medical): No  Physical Activity: Insufficiently Active (05/11/2024)   Exercise Vital Sign    Days of Exercise per Week: 1 day    Minutes of Exercise per Session: 30 min  Stress: No Stress Concern Present (05/11/2024)   [employer] of Occupational Health - Occupational Stress Questionnaire    Feeling of Stress: Only a little  Social Connections: Socially Integrated (05/11/2024)   Social Connection and Isolation Panel    Frequency of Communication with Friends and Family: More than three times a week    Frequency of Social Gatherings with Friends and Family: More than three times a week    Attends Religious Services: More than 4 times per year    Active Member of Clubs or Organizations: Yes    Attends Banker Meetings: More than 4 times per year    Marital Status: Living with partner  Intimate Partner Violence: Not At Risk (05/15/2024)   Epic    Fear of Current or Ex-Partner: No    Emotionally Abused: No    Physically Abused: No    Sexually Abused: No  Depression (PHQ2-9): Low Risk (05/15/2024)   Depression (PHQ2-9)    PHQ-2 Score: 0  Alcohol  Screen: Low Risk (05/11/2024)   Alcohol  Screen    Last Alcohol  Screening Score (AUDIT): 1  Housing: Unknown (05/11/2024)   Epic    Unable to Pay for Housing in the Last Year: No    Number of Times Moved in the Last Year: Not on file    Homeless in the Last Year: No  Utilities: Not At Risk (10/21/2023)   Epic    Threatened with loss of utilities: No  Health Literacy:  Adequate Health Literacy (05/15/2024)   B1300 Health Literacy    Frequency of need for help with medical instructions: Never    Review of Systems  All other systems reviewed and are negative.       Objective    BP 130/78   Pulse 75   Temp 97.7 F (36.5 C)   Resp 18   Ht 5' 5 (1.651 m)   Wt 236 lb 8 oz (107.3 kg)   LMP 10/29/2013   BMI 39.36 kg/m   Physical Exam Constitutional:      Appearance: Normal appearance.  HENT:     Head: Normocephalic and atraumatic.     Mouth/Throat:     Mouth: Mucous membranes are moist.     Pharynx: Oropharynx is clear.  Eyes:     Conjunctiva/sclera: Conjunctivae normal.  Cardiovascular:     Rate and Rhythm: Normal rate and regular rhythm.  Pulmonary:     Effort: Pulmonary effort is normal.     Breath sounds: Normal breath sounds.  Skin:    General: Skin is warm and dry.  Neurological:     General: No focal deficit present.     Mental Status: She is alert. Mental status is at baseline.  Psychiatric:  Mood and Affect: Mood normal.        Behavior: Behavior normal.         Assessment & Plan:   Assessment & Plan Postmenopausal bleeding with abnormal endometrial thickening Differential diagnosis includes endometrial hyperplasia or malignancy. She prefers hysterectomy if biopsy is abnormal. - Scheduled endometrial biopsy with OB GYN on February 3rd at 2:15 PM. - Coordinated with OB GYN to expedite biopsy if possible.  Atrial fibrillation/HTN Managed with flecainide  and Eliquis . No recent episodes reported. Prefers to continue current management under cardiologist's guidance. - Refilled flecainide  100 mg twice daily until cardiology appointment. - Continue Eliquis  5 mg twice daily. - Continue follow-up with electrophysiologist on March 25th.  Psoriatic arthritis Managed with topical treatments. She uses aspirin cream for flare-ups and avoids systemic treatments. - Continue topical aspirin cream as needed for  flare-ups.  Hypothyroidism with thyroid  nodules Last ultrasound in December 2021 showed subcentimeter left upper pole thyroid  nodule. TSH levels are well-managed. - Ordered thyroid  ultrasound for re-evaluation of nodules. - Continue levothyroxine  as prescribed.  Mixed hyperlipidemia Zetia  is used to treat cholesterol. She is making dietary efforts to manage cholesterol levels. - Refilled Zetia  as prescribed.  Prediabetes A1c of 5.7% in July. Monitoring is ongoing to prevent progression to diabetes. - Performed A1c test today which was still 5.7%. - Continue monitoring A1c every six months.  Seasonal allergic rhinitis Managed with montelukast  and occasional Claritin . She experiences throat clearing and coughing, possibly due to sinus drainage. - Continue montelukast  as prescribed. - Use Claritin  as needed for allergy symptoms. - Use nasal saline spray multiple times a day. - Consider nasal steroid spray like Flonase  for significant events.  Chronic back pain Managed with cyclobenzaprine . She experiences muscular tightness and uses medication for relief. - Refilled cyclobenzaprine  for use at bedtime.  History of malignant melanoma Malignant melanoma excised at age 89. Regular dermatology follow-ups are maintained. - Continue annual dermatology appointments.  General Health Maintenance General health maintenance includes vaccinations and screenings. She is up to date on most vaccinations and screenings. - Administered pneumonia vaccine. - Continue annual mammograms and dermatology screenings. - Schedule colon cancer screening as appropriate.  - diltiazem  (CARDIZEM  CD) 120 MG 24 hr capsule; Take 1 capsule (120 mg total) by mouth daily.  Dispense: 90 capsule; Refill: 1 - flecainide  (TAMBOCOR ) 100 MG tablet; TAKE 1 TABLET BY MOUTH EVERY 12 HOURS FOR AFIB  Dispense: 60 tablet; Refill: 1 - ezetimibe  (ZETIA ) 10 MG tablet; Take 1 tablet (10 mg total) by mouth daily.  Dispense: 90 tablet;  Refill: 1 - levothyroxine  (SYNTHROID ) 50 MCG tablet; Take 1 tablet (50 mcg total) by mouth daily before breakfast. TAKE 1 TABLET DAILY ON EMPTY STOMACH WITH ONLY WATER FOR 30 MIN (NO ANTACID MEDS, CALCIUM  OR MAGNESIUM  FOR 4 HOURS & AVOID BIOTIN)  Dispense: 90 tablet; Refill: 1 - POCT HgB A1C - US  THYROID ; Future - cyclobenzaprine  (FLEXERIL ) 5 MG tablet; Take 1 tablet (5 mg total) by mouth at bedtime.  Dispense: 90 tablet; Refill: 1 - montelukast  (SINGULAIR ) 10 MG tablet; TAKE 1 TABLET BY MOUTH DAILY FOR ALLERGIES  Dispense: 90 tablet; Refill: 3 - Pneumococcal conjugate vaccine 20-valent (Prevnar 20)    Return in about 6 months (around 11/12/2024).   Monica Fischer, DO  "

## 2024-05-22 ENCOUNTER — Ambulatory Visit: Admission: RE | Admit: 2024-05-22

## 2024-05-22 DIAGNOSIS — E042 Nontoxic multinodular goiter: Secondary | ICD-10-CM

## 2024-06-23 ENCOUNTER — Encounter: Admitting: Obstetrics

## 2024-07-08 ENCOUNTER — Ambulatory Visit: Admitting: Student
